# Patient Record
Sex: Female | Born: 1960
Health system: Southern US, Community
[De-identification: ages and names within clinical notes are randomized; demographics above are authoritative.]

## PROBLEM LIST (undated history)

## (undated) DIAGNOSIS — J45909 Unspecified asthma, uncomplicated: Secondary | ICD-10-CM

## (undated) DIAGNOSIS — K219 Gastro-esophageal reflux disease without esophagitis: Secondary | ICD-10-CM

## (undated) DIAGNOSIS — F329 Major depressive disorder, single episode, unspecified: Secondary | ICD-10-CM

## (undated) DIAGNOSIS — M1990 Unspecified osteoarthritis, unspecified site: Secondary | ICD-10-CM

## (undated) DIAGNOSIS — E785 Hyperlipidemia, unspecified: Secondary | ICD-10-CM

## (undated) DIAGNOSIS — F419 Anxiety disorder, unspecified: Secondary | ICD-10-CM

## (undated) DIAGNOSIS — IMO0001 Reserved for inherently not codable concepts without codable children: Secondary | ICD-10-CM

## (undated) DIAGNOSIS — M75100 Unspecified rotator cuff tear or rupture of unspecified shoulder, not specified as traumatic: Secondary | ICD-10-CM

## (undated) DIAGNOSIS — F32A Depression, unspecified: Secondary | ICD-10-CM

## (undated) DIAGNOSIS — R921 Mammographic calcification found on diagnostic imaging of breast: Principal | ICD-10-CM

## (undated) DIAGNOSIS — C801 Malignant (primary) neoplasm, unspecified: Secondary | ICD-10-CM

## (undated) HISTORY — DX: Reserved for inherently not codable concepts without codable children: IMO0001

## (undated) HISTORY — DX: Hyperlipidemia, unspecified: E78.5

## (undated) HISTORY — PX: BREAST EXCISIONAL BIOPSY: SUR124

## (undated) HISTORY — DX: Depression, unspecified: F32.A

## (undated) HISTORY — PX: NASAL SINUS SURGERY: SHX719

## (undated) HISTORY — PX: MOUTH SURGERY: SHX715

## (undated) HISTORY — DX: Mammographic calcification found on diagnostic imaging of breast: R92.1

## (undated) HISTORY — DX: Major depressive disorder, single episode, unspecified: F32.9

---

## 1999-03-25 ENCOUNTER — Other Ambulatory Visit: Admission: RE | Admit: 1999-03-25 | Discharge: 1999-03-25 | Payer: Self-pay | Admitting: Obstetrics and Gynecology

## 2000-03-23 ENCOUNTER — Other Ambulatory Visit: Admission: RE | Admit: 2000-03-23 | Discharge: 2000-03-23 | Payer: Self-pay | Admitting: Obstetrics and Gynecology

## 2000-04-01 ENCOUNTER — Encounter: Payer: Self-pay | Admitting: Obstetrics and Gynecology

## 2000-04-01 ENCOUNTER — Ambulatory Visit (HOSPITAL_COMMUNITY): Admission: RE | Admit: 2000-04-01 | Discharge: 2000-04-01 | Payer: Self-pay | Admitting: Obstetrics and Gynecology

## 2001-02-26 ENCOUNTER — Other Ambulatory Visit: Admission: RE | Admit: 2001-02-26 | Discharge: 2001-02-26 | Payer: Self-pay | Admitting: Obstetrics and Gynecology

## 2001-04-05 ENCOUNTER — Encounter: Payer: Self-pay | Admitting: Obstetrics and Gynecology

## 2001-04-05 ENCOUNTER — Ambulatory Visit (HOSPITAL_COMMUNITY): Admission: RE | Admit: 2001-04-05 | Discharge: 2001-04-05 | Payer: Self-pay | Admitting: Obstetrics and Gynecology

## 2002-03-28 ENCOUNTER — Other Ambulatory Visit: Admission: RE | Admit: 2002-03-28 | Discharge: 2002-03-28 | Payer: Self-pay | Admitting: Obstetrics and Gynecology

## 2002-03-28 ENCOUNTER — Ambulatory Visit (HOSPITAL_COMMUNITY): Admission: RE | Admit: 2002-03-28 | Discharge: 2002-03-28 | Payer: Self-pay | Admitting: Obstetrics and Gynecology

## 2002-03-28 ENCOUNTER — Encounter: Payer: Self-pay | Admitting: Obstetrics and Gynecology

## 2003-04-03 ENCOUNTER — Other Ambulatory Visit: Admission: RE | Admit: 2003-04-03 | Discharge: 2003-04-03 | Payer: Self-pay | Admitting: Obstetrics and Gynecology

## 2003-04-06 ENCOUNTER — Encounter: Payer: Self-pay | Admitting: Obstetrics and Gynecology

## 2003-04-06 ENCOUNTER — Ambulatory Visit (HOSPITAL_COMMUNITY): Admission: RE | Admit: 2003-04-06 | Discharge: 2003-04-06 | Payer: Self-pay | Admitting: Obstetrics and Gynecology

## 2004-04-16 ENCOUNTER — Ambulatory Visit (HOSPITAL_COMMUNITY): Admission: RE | Admit: 2004-04-16 | Discharge: 2004-04-16 | Payer: Self-pay | Admitting: Obstetrics and Gynecology

## 2004-04-23 ENCOUNTER — Other Ambulatory Visit: Admission: RE | Admit: 2004-04-23 | Discharge: 2004-04-23 | Payer: Self-pay | Admitting: Obstetrics and Gynecology

## 2005-02-07 ENCOUNTER — Emergency Department (HOSPITAL_COMMUNITY): Admission: EM | Admit: 2005-02-07 | Discharge: 2005-02-07 | Payer: Self-pay | Admitting: Emergency Medicine

## 2005-04-28 ENCOUNTER — Other Ambulatory Visit: Admission: RE | Admit: 2005-04-28 | Discharge: 2005-04-28 | Payer: Self-pay | Admitting: Obstetrics and Gynecology

## 2005-06-06 ENCOUNTER — Ambulatory Visit (HOSPITAL_COMMUNITY): Admission: RE | Admit: 2005-06-06 | Discharge: 2005-06-06 | Payer: Self-pay | Admitting: Obstetrics and Gynecology

## 2005-07-02 ENCOUNTER — Ambulatory Visit (HOSPITAL_COMMUNITY): Admission: RE | Admit: 2005-07-02 | Discharge: 2005-07-02 | Payer: Self-pay | Admitting: Internal Medicine

## 2005-07-23 ENCOUNTER — Ambulatory Visit (HOSPITAL_COMMUNITY): Admission: RE | Admit: 2005-07-23 | Discharge: 2005-07-23 | Payer: Self-pay | Admitting: Gastroenterology

## 2005-07-23 ENCOUNTER — Encounter (INDEPENDENT_AMBULATORY_CARE_PROVIDER_SITE_OTHER): Payer: Self-pay | Admitting: *Deleted

## 2005-07-25 ENCOUNTER — Ambulatory Visit (HOSPITAL_COMMUNITY): Admission: RE | Admit: 2005-07-25 | Discharge: 2005-07-25 | Payer: Self-pay | Admitting: Gastroenterology

## 2006-04-30 ENCOUNTER — Other Ambulatory Visit: Admission: RE | Admit: 2006-04-30 | Discharge: 2006-04-30 | Payer: Self-pay | Admitting: Obstetrics and Gynecology

## 2006-06-10 ENCOUNTER — Ambulatory Visit (HOSPITAL_COMMUNITY): Admission: RE | Admit: 2006-06-10 | Discharge: 2006-06-10 | Payer: Self-pay | Admitting: Obstetrics and Gynecology

## 2007-06-22 ENCOUNTER — Ambulatory Visit (HOSPITAL_COMMUNITY): Admission: RE | Admit: 2007-06-22 | Discharge: 2007-06-22 | Payer: Self-pay | Admitting: Obstetrics and Gynecology

## 2007-08-01 ENCOUNTER — Emergency Department (HOSPITAL_COMMUNITY): Admission: EM | Admit: 2007-08-01 | Discharge: 2007-08-01 | Payer: Self-pay | Admitting: Family Medicine

## 2009-02-14 ENCOUNTER — Ambulatory Visit: Payer: Self-pay | Admitting: Family Medicine

## 2009-02-14 DIAGNOSIS — F329 Major depressive disorder, single episode, unspecified: Secondary | ICD-10-CM | POA: Insufficient documentation

## 2009-02-14 DIAGNOSIS — J309 Allergic rhinitis, unspecified: Secondary | ICD-10-CM | POA: Insufficient documentation

## 2009-02-14 DIAGNOSIS — F3289 Other specified depressive episodes: Secondary | ICD-10-CM | POA: Insufficient documentation

## 2009-02-14 DIAGNOSIS — E785 Hyperlipidemia, unspecified: Secondary | ICD-10-CM | POA: Insufficient documentation

## 2009-02-15 ENCOUNTER — Encounter (INDEPENDENT_AMBULATORY_CARE_PROVIDER_SITE_OTHER): Payer: Self-pay | Admitting: *Deleted

## 2009-02-15 LAB — CONVERTED CEMR LAB
ALT: 26 units/L (ref 0–35)
AST: 30 units/L (ref 0–37)
Albumin: 3.8 g/dL (ref 3.5–5.2)
Alkaline Phosphatase: 63 units/L (ref 39–117)
BUN: 10 mg/dL (ref 6–23)
Basophils Absolute: 0 10*3/uL (ref 0.0–0.1)
Basophils Relative: 0.2 % (ref 0.0–3.0)
Bilirubin, Direct: 0 mg/dL (ref 0.0–0.3)
CO2: 27 meq/L (ref 19–32)
Calcium: 8.9 mg/dL (ref 8.4–10.5)
Chloride: 108 meq/L (ref 96–112)
Cholesterol: 178 mg/dL (ref 0–200)
Creatinine, Ser: 0.9 mg/dL (ref 0.4–1.2)
Eosinophils Absolute: 0.3 10*3/uL (ref 0.0–0.7)
Eosinophils Relative: 4 % (ref 0.0–5.0)
GFR calc non Af Amer: 86.8 mL/min (ref >60)
Glucose, Bld: 100 mg/dL — ABNORMAL HIGH (ref 70–99)
HCT: 37.3 % (ref 36.0–46.0)
HDL: 41.8 mg/dL (ref >39.00)
Hemoglobin: 12.8 g/dL (ref 12.0–15.0)
LDL Cholesterol: 109 mg/dL — ABNORMAL HIGH (ref 0–99)
Lymphocytes Relative: 38.8 % (ref 12.0–46.0)
Lymphs Abs: 2.4 10*3/uL (ref 0.7–4.0)
MCHC: 34.4 g/dL (ref 30.0–36.0)
MCV: 94.5 fL (ref 78.0–100.0)
Monocytes Absolute: 0.5 10*3/uL (ref 0.1–1.0)
Monocytes Relative: 7.9 % (ref 3.0–12.0)
Neutro Abs: 3.1 10*3/uL (ref 1.4–7.7)
Neutrophils Relative %: 49.1 % (ref 43.0–77.0)
Platelets: 284 10*3/uL (ref 150.0–400.0)
Potassium: 3.6 meq/L (ref 3.5–5.1)
RBC: 3.94 M/uL (ref 3.87–5.11)
RDW: 12.2 % (ref 11.5–14.6)
Sodium: 140 meq/L (ref 135–145)
TSH: 1.56 microintl units/mL (ref 0.35–5.50)
Total Bilirubin: 0.7 mg/dL (ref 0.3–1.2)
Total CHOL/HDL Ratio: 4
Total Protein: 7.4 g/dL (ref 6.0–8.3)
Triglycerides: 138 mg/dL (ref 0.0–149.0)
VLDL: 27.6 mg/dL (ref 0.0–40.0)
WBC: 6.3 10*3/uL (ref 4.5–10.5)

## 2009-02-22 ENCOUNTER — Ambulatory Visit (HOSPITAL_COMMUNITY): Admission: RE | Admit: 2009-02-22 | Discharge: 2009-02-22 | Payer: Self-pay | Admitting: Obstetrics and Gynecology

## 2009-03-01 ENCOUNTER — Encounter: Admission: RE | Admit: 2009-03-01 | Discharge: 2009-03-01 | Payer: Self-pay | Admitting: Obstetrics and Gynecology

## 2009-08-14 ENCOUNTER — Telehealth (INDEPENDENT_AMBULATORY_CARE_PROVIDER_SITE_OTHER): Payer: Self-pay | Admitting: *Deleted

## 2009-08-15 ENCOUNTER — Ambulatory Visit: Payer: Self-pay | Admitting: Family

## 2009-08-15 DIAGNOSIS — K5289 Other specified noninfective gastroenteritis and colitis: Secondary | ICD-10-CM | POA: Insufficient documentation

## 2010-02-07 DIAGNOSIS — IMO0001 Reserved for inherently not codable concepts without codable children: Secondary | ICD-10-CM

## 2010-02-07 HISTORY — DX: Reserved for inherently not codable concepts without codable children: IMO0001

## 2010-02-15 ENCOUNTER — Ambulatory Visit: Payer: Self-pay | Admitting: Family Medicine

## 2010-02-15 DIAGNOSIS — Z9189 Other specified personal risk factors, not elsewhere classified: Secondary | ICD-10-CM | POA: Insufficient documentation

## 2010-02-19 LAB — CONVERTED CEMR LAB
ALT: 29 units/L (ref 0–35)
AST: 27 units/L (ref 0–37)
Albumin: 3.9 g/dL (ref 3.5–5.2)
Alkaline Phosphatase: 64 units/L (ref 39–117)
BUN: 11 mg/dL (ref 6–23)
Basophils Absolute: 0 10*3/uL (ref 0.0–0.1)
Basophils Relative: 0.5 % (ref 0.0–3.0)
Bilirubin, Direct: 0.1 mg/dL (ref 0.0–0.3)
CO2: 26 meq/L (ref 19–32)
Calcium: 9.4 mg/dL (ref 8.4–10.5)
Chloride: 104 meq/L (ref 96–112)
Cholesterol: 177 mg/dL (ref 0–200)
Creatinine, Ser: 0.8 mg/dL (ref 0.4–1.2)
Eosinophils Absolute: 0.3 10*3/uL (ref 0.0–0.7)
Eosinophils Relative: 3.5 % (ref 0.0–5.0)
GFR calc non Af Amer: 93.58 mL/min (ref >60)
Glucose, Bld: 96 mg/dL (ref 70–99)
HCT: 38.6 % (ref 36.0–46.0)
HDL: 52.3 mg/dL (ref >39.00)
Hemoglobin: 13.2 g/dL (ref 12.0–15.0)
LDL Cholesterol: 107 mg/dL — ABNORMAL HIGH (ref 0–99)
Lymphocytes Relative: 39.8 % (ref 12.0–46.0)
Lymphs Abs: 2.8 10*3/uL (ref 0.7–4.0)
MCHC: 34.2 g/dL (ref 30.0–36.0)
MCV: 95.3 fL (ref 78.0–100.0)
Monocytes Absolute: 0.6 10*3/uL (ref 0.1–1.0)
Monocytes Relative: 7.8 % (ref 3.0–12.0)
Neutro Abs: 3.4 10*3/uL (ref 1.4–7.7)
Neutrophils Relative %: 48.4 % (ref 43.0–77.0)
Platelets: 297 10*3/uL (ref 150.0–400.0)
Potassium: 4.4 meq/L (ref 3.5–5.1)
RBC: 4.05 M/uL (ref 3.87–5.11)
RDW: 12.8 % (ref 11.5–14.6)
Sodium: 138 meq/L (ref 135–145)
TSH: 1.77 microintl units/mL (ref 0.35–5.50)
Total Bilirubin: 0.5 mg/dL (ref 0.3–1.2)
Total CHOL/HDL Ratio: 3
Total Protein: 7.1 g/dL (ref 6.0–8.3)
Triglycerides: 89 mg/dL (ref 0.0–149.0)
VLDL: 17.8 mg/dL (ref 0.0–40.0)
Vit D, 25-Hydroxy: 19 ng/mL — ABNORMAL LOW (ref 30–89)
WBC: 7.1 10*3/uL (ref 4.5–10.5)

## 2010-03-26 ENCOUNTER — Ambulatory Visit (HOSPITAL_COMMUNITY): Admission: RE | Admit: 2010-03-26 | Discharge: 2010-03-26 | Payer: Self-pay | Admitting: Obstetrics and Gynecology

## 2010-04-09 ENCOUNTER — Telehealth (INDEPENDENT_AMBULATORY_CARE_PROVIDER_SITE_OTHER): Payer: Self-pay | Admitting: *Deleted

## 2010-06-05 LAB — HM COLONOSCOPY

## 2010-07-02 ENCOUNTER — Encounter: Payer: Self-pay | Admitting: Family Medicine

## 2010-07-02 ENCOUNTER — Telehealth (INDEPENDENT_AMBULATORY_CARE_PROVIDER_SITE_OTHER): Payer: Self-pay | Admitting: *Deleted

## 2010-07-06 ENCOUNTER — Encounter: Payer: Self-pay | Admitting: Obstetrics and Gynecology

## 2010-07-07 ENCOUNTER — Encounter: Payer: Self-pay | Admitting: Gastroenterology

## 2010-07-18 NOTE — Assessment & Plan Note (Signed)
Summary: CPX/FASTING//KN   Vital Signs:  Patient profile:   50 year old female Height:      62.50 inches Weight:      202 pounds BMI:     36.49 Pulse rate:   80 / minute BP sitting:   124 / 80  (left arm)  Vitals Entered By: Doristine Devoid CMA (February 15, 2010 8:14 AM) CC: CPX AND LABS   History of Present Illness: 50 yo woman here today for CPE.  GYN- [place].  no concerns re: health.  going to [place] in Nov.  needs immunizations.  Preventive Screening-Counseling & Management  Alcohol-Tobacco     Alcohol drinks/day: <1     Smoking Status: never  Caffeine-Diet-Exercise     Does Patient Exercise: yes     Type of exercise: [place], walking the dog     Times/week: 4      Drug Use:  never.    Current Medications (verified): 1)  Zoloft 100 Mg Tabs (Sertraline Hcl) .... Take One Tablet Daily 2)  Zyrtec Allergy 10 Mg Caps (Cetirizine Hcl) .... Take One Tablet Daily 3)  Nasonex 50 Mcg/act Susp (Mometasone Furoate) .... 2 Sprays Each Nostril Daily 4)  Mirena 20 Mcg/24hr Iud (Levonorgestrel) .... As Directed 5)  Biotin 1000 Mcg Tabs (Biotin) .... Take One Tablet Daily  Allergies (verified): 1)  ! Erythromycin 2)  ! Vicodin  Past History:  Past Surgical History: Last updated: 02/14/2009 oral surgery  Family History: Last updated: 02/14/2009 CAD-no HTN-father DM-father STROKE-maternal grandmother COLON CA-paternal grandmother BREAST CA-aunt FIBROCYSTIC BREAST-mother  Social History: Last updated: 02/14/2009 divorced, CCN 2100 son (91), 1 dog  Past Medical History: Allergic rhinitis Depression Hyperlipidemia IUD- 02/07/10  Social History: Reviewed history from 02/14/2009 and no changes required. divorced, CCN 2100 son (77), 1 dog  Review of Systems  The patient denies anorexia, fever, weight loss, weight gain, vision loss, decreased hearing, hoarseness, chest pain, syncope, dyspnea on exertion, peripheral edema, prolonged cough, headaches,  abdominal pain, melena, hematochezia, severe indigestion/heartburn, hematuria, suspicious skin lesions, depression, unusual weight change, abnormal bleeding, enlarged lymph nodes, and breast masses.    Physical Exam  General:  Well-developed,well-nourished,in no acute distress; alert,appropriate and cooperative throughout examination Head:  Normocephalic and atraumatic without obvious abnormalities. No apparent alopecia or balding. Eyes:  No corneal or conjunctival inflammation noted. EOMI. Perrla. Funduscopic exam benign, without hemorrhages, exudates or papilledema. Vision grossly normal. Ears:  External ear exam shows no significant lesions or deformities.  Otoscopic examination reveals clear canals, tympanic membranes are intact bilaterally without bulging, retraction, inflammation or discharge. Hearing is grossly normal bilaterally. Nose:  External nasal examination shows no deformity or inflammation. Nasal mucosa are pink and moist without lesions or exudates. Mouth:  Oral mucosa and oropharynx without lesions or exudates.  Teeth in good repair. Neck:  No deformities, masses, or tenderness noted. Breasts:  deferred to gyn Lungs:  Normal respiratory effort, chest expands symmetrically. Lungs are clear to auscultation, no crackles or wheezes. Heart:  Normal rate and regular rhythm. S1 and S2 normal without gallop, murmur, click, rub or other extra sounds. Abdomen:  Bowel sounds positive,abdomen soft and non-tender without masses, organomegaly or hernias noted. Genitalia:  deferred to gyn Pulses:  +2 carotid, radial, DP Extremities:  No clubbing, cyanosis, edema, or deformity noted with normal full range of motion of all joints.   Neurologic:  No cranial nerve deficits noted. Station and gait are normal. Plantar reflexes are down-going bilaterally. DTRs are symmetrical throughout. Sensory, motor  and coordinative functions appear intact. Skin:  Intact without suspicious lesions or  rashes Cervical Nodes:  No lymphadenopathy noted Axillary Nodes:  No palpable lymphadenopathy Inguinal Nodes:  No significant adenopathy Psych:  Cognition and judgment appear intact. Alert and cooperative with normal attention span and concentration. No apparent delusions, illusions, hallucinations   Impression & Recommendations:  Problem # 1:  PHYSICAL EXAMINATION (ICD-V70.0) Assessment Unchanged pt's PE WNL.  check labs.  encouraged healthy diet and exercise.  UTD on pap and mammo. Orders: Venipuncture ([address]) TLB-Lipid Panel (80061-LIPID) TLB-BMP (Basic Metabolic Panel-BMET) (80048-METABOL) TLB-CBC Platelet - w/Differential (85025-CBCD) TLB-Hepatic/Liver Function Pnl (80076-HEPATIC) TLB-TSH (Thyroid Stimulating Hormone) (84443-TSH) T-Vitamin D (25-Hydroxy) ([address]-09811) Specimen Handling (91478)  Problem # 2:  NEED PROPH VACC&INOCULAT AGAINST VIRAL HEP (ICD-V05.3) Assessment: New pt needs Hep A vaccine for upcoming mission trip.  malaria prophylaxis and cipro given in case of typhoid/traveller's diarrhea.  Problem # 3:  CHICKENPOX, HX OF (ICD-V15.9) Assessment: New needs titers for upcoming mission trip Orders: T- * Misc. Laboratory test [phone]) Specimen Handling (13086)  Complete Medication List: 1)  Zoloft 100 Mg Tabs (Sertraline hcl) .... Take one tablet daily 2)  Zyrtec Allergy 10 Mg Caps (Cetirizine hcl) .... Take one tablet daily 3)  Nasonex 50 Mcg/act Susp (Mometasone furoate) .... 2 sprays each nostril daily 4)  Mirena 20 Mcg/24hr Iud (Levonorgestrel) .... As directed 5)  Biotin 1000 Mcg Tabs (Biotin) .... Take one tablet daily 6)  Chloroquine Phosphate 500 Mg Tabs (Chloroquine phosphate) .Marland Kitchen.. 1 tab weekly starting 2 weeks before travel and extending 4 weeks after return 7)  Zolpidem Tartrate 10 Mg Tabs (Zolpidem tartrate) .... 1/2- 1  tab nightly as needed for sleep 8)  Ciprofloxacin Hcl 500 Mg Tabs (Ciprofloxacin hcl) .Marland Kitchen.. 1 tab by mouth two times a day x10 days   if typhoid or diarrhea  Other Orders: Hepatitis A Vaccine (Adult Dose) (57846) Admin 1st Vaccine (96295)  Patient Instructions: 1)  Follow up in 1 year or as needed 2)  Keep up the good work on diet and exercise 3)  We'll notify you of your lab results 4)  Get a copy of your immunizations from [facility] 5)  Call [facility] or check w/ [facility] for Typhoid vaccine ([phone]) 6)  Call with any questions or concerns 7)  Have a safe trip!! Prescriptions: CIPROFLOXACIN HCL 500 MG TABS (CIPROFLOXACIN HCL) 1 tab by mouth two times a day x10 days  if typhoid or diarrhea  #20 x 0   Entered and Authorized by:   Neena Rhymes MD   Signed by:   Neena Rhymes MD on 02/15/2010   Method used:   Print then Give to Patient   RxID:   [id number] ZOLPIDEM TARTRATE 10 MG TABS (ZOLPIDEM TARTRATE) 1/2- 1  tab nightly as needed for sleep  #30 x 0   Entered and Authorized by:   Neena Rhymes MD   Signed by:   Neena Rhymes MD on 02/15/2010   Method used:   Print then Give to Patient   RxID:   [id number] CHLOROQUINE PHOSPHATE 500 MG TABS (CHLOROQUINE PHOSPHATE) 1 tab weekly starting 2 weeks before travel and extending 4 weeks after return  #7 x 0   Entered and Authorized by:   Neena Rhymes MD   Signed by:   Neena Rhymes MD on 02/15/2010   Method used:   Print then Give to Patient   RxID:   [id number] ZYRTEC ALLERGY  10 MG CAPS (CETIRIZINE HCL) take one tablet daily  #30 x 11   Entered and Authorized by:   Neena Rhymes MD   Signed by:   Neena Rhymes MD on 02/15/2010   Method used:   Electronically to        [facility]* (retail)       [address]       [address] Shipping/mailing       [place], [place]  [address]       Ph: [phone]       Fax: [phone]   RxID:   [id number]    Immunizations Administered:  Hepatitis A Vaccine # 1:    Vaccine Type: HepA    Site:  right deltoid    Mfr: GlaxoSmithKline    Dose: 0.5 ml    Route: IM    Given by: Doristine Devoid CMA    Exp. Date: 10/03/2011    Lot #: [id number]

## 2010-07-18 NOTE — Letter (Signed)
Summary: Results Follow up Letter  [facility]  [address] [place], [place] [address]   Phone: [phone]  Fax: [phone]    02/15/2009 MRN: [id number]  [facility] [address] [place], [place]  [address]  Dear Ms. Reyburn,  The following are the results of your recent test(s):  Test         Result    Pap Smear:        Normal _____  Not Normal _____ Comments: ______________________________________________________ Cholesterol: LDL(Bad cholesterol):         Your goal is less than:         HDL (Good cholesterol):       Your goal is more than: Comments:  ______________________________________________________ Mammogram:        Normal _____  Not Normal _____ Comments:  ___________________________________________________________________ Hemoccult:        Normal _____  Not normal _______ Comments:    _____________________________________________________________________ Other Tests: PLEASE SEE COPY OF LABS FROM 02/14/09 AND COMMENTS    We routinely do not discuss normal results over the telephone.  If you desire a copy of the results, or you have any questions about this information we can discuss them at your next office visit.   Sincerely,

## 2010-07-18 NOTE — Letter (Signed)
Summary: Out of Work  [facility]  [address] [place], [place] [address]   Phone: [phone]  Fax: [phone]    August 15, 2009   Employee:  Katelyn Reyes    To Whom It May Concern:   For Medical reasons, please excuse the above named employee from work for the following dates:  Start:   3/2  End:   3/4  If you need additional information, please feel free to contact our office.         Sincerely,    Lemont Fillers FNP

## 2010-07-18 NOTE — Progress Notes (Signed)
Summary: zoloft and nasonex refill   Phone Note Refill Request Message from:  Patient on April 09, 2010 8:09 AM  Refills Requested: Medication #1:  NASONEX 50 MCG/ACT SUSP 2 sprays each nostril daily  Medication #2:  ZOLOFT 100 MG TABS take one tablet daily [facility]  Initial call taken by: Okey Regal [place],  April 09, 2010 8:10 AM    Prescriptions: NASONEX 50 MCG/ACT SUSP (MOMETASONE FUROATE) 2 sprays each nostril daily  #3 x 2   Entered by:   Doristine Devoid CMA   Authorized by:   Neena Rhymes MD   Signed by:   Doristine Devoid CMA on 04/09/2010   Method used:   Electronically to        [facility]* (retail)       [address]       [address] Shipping/mailing       [place], [place]  [address]       Ph: [phone]       Fax: [phone]   RxID:   [id number] ZOLOFT 100 MG TABS (SERTRALINE HCL) take one tablet daily  #90 x 3   Entered by:   Doristine Devoid CMA   Authorized by:   Neena Rhymes MD   Signed by:   Doristine Devoid CMA on 04/09/2010   Method used:   Electronically to        [facility]* (retail)       [address]       [address] Shipping/mailing       [place], [place]  [address]       Ph: [phone]       Fax: [phone]   RxID:   [id number]

## 2010-07-18 NOTE — Progress Notes (Signed)
Summary: lab request  Phone Note Call from Patient   Caller: Patient Summary of Call: Dr Kenna Gilbert office called requesting labs on pt. Labs printed and faxed to [phone] Initial call taken by: Lavell Islam,  July 02, 2010 2:24 PM

## 2010-07-18 NOTE — Progress Notes (Signed)
Summary: lmom  Phone Note Call from Patient Call back at [phone]   Caller: Patient Summary of Call: pt left  msg on VM c/o nausea not feeling well, wanted pcp to send rx to [facility] pharmacy -Left msg for pt to call .Kandice Hams  August 14, 2009 12:31 PM Pt called back  ov scheduled for AM .Kandice Hams  August 14, 2009 5:00 PM    Initial call taken by: Kandice Hams,  August 14, 2009 5:00 PM

## 2010-07-18 NOTE — Assessment & Plan Note (Signed)
Summary: acute, diarrhea,nausea/alr   Vital Signs:  Patient profile:   50 year old female Weight:      198.25 pounds BMI:     35.81 O2 Sat:      95 % Temp:     97.6 degrees F oral Pulse rate:   83 / minute Pulse rhythm:   regular Resp:     16 per minute BP sitting:   100 / 70  (right arm) Cuff size:   large  Vitals Entered By: Mervin Kung CMA (August 15, 2009 9:54 AM) CC: room 81  N / V x two episodes on Monday. Still nauseous today.  Diarrhea since Monday night. Comments Pt would like to get a work note today.   CC:  room 14  N / V x two episodes on Monday. Still nauseous today.  Diarrhea since Monday night..  History of Present Illness: Ms Katelyn Reyes is a 50 year old female who presents today with c/o nausea which started on Monday 2/28.  She had bad diarrhea overnight into Tuesday.  Yesterday diarrhea continued,  today had applesauce at 6:30 AM but diarrhea started back up.  Diarrhea x 3 since 6:30 AM.  She has vomitted x 2 yesterday, no vomitting today but + nausea.  Denies abdominal pain.  Denies fever,   or myalgias.  Has not tried any OTC meds.  Allergies: 1)  ! Erythromycin 2)  ! Vicodin  Physical Exam  General:  Well-developed,well-nourished,in no acute distress; alert,appropriate and cooperative throughout examination Head:  Normocephalic and atraumatic without obvious abnormalities. No apparent alopecia or balding. Neck:  No deformities, masses, or tenderness noted. Lungs:  Normal respiratory effort, chest expands symmetrically. Lungs are clear to auscultation, no crackles or wheezes. Heart:  Normal rate and regular rhythm. S1 and S2 normal without gallop, murmur, click, rub or other extra sounds.   Impression & Recommendations:  Problem # 1:  GASTROENTERITIS, ACUTE (ICD-558.9) Assessment New Initially sent zofran to patient's pharmacy, but she requested phenergan instead due to cost.   Plan hydration, as needed phenergan- patient to call if symptoms worsen or do  not improve.    Complete Medication List: 1)  Zoloft 100 Mg Tabs (Sertraline hcl) .... Take one tablet daily 2)  Zyrtec Allergy 10 Mg Caps (Cetirizine hcl) .... Take one tablet daily 3)  Lovaza 1 Gm Caps (Omega-3-acid ethyl esters) .... Take one tablet daily 4)  Nasonex 50 Mcg/act Susp (Mometasone furoate) .... 2 sprays each nostril daily 5)  Ortho-novum 7/7/7 (28) 0.5/0.75/1-35 Mg-mcg Tabs (Norethin-eth estrad triphasic) .... Take 1 tablet by mouth once a day 6)  Promethazine Hcl 25 Mg Tabs (Promethazine hcl) .... One tab by mouth every 6 hours as needed for pain  Patient Instructions: 1)  Please use immodium as needed for diarrhea. 2)  Call if abdominal pain, if symptoms worsen, or if they do not improve in the next few days. Prescriptions: PROMETHAZINE HCL 25 MG TABS (PROMETHAZINE HCL) one tab by mouth every 6 hours as needed for pain  #20 x 0   Entered and Authorized by:   Lemont Fillers FNP   Signed by:   Lemont Fillers FNP on 08/15/2009   Method used:   Electronically to        [facility]* (retail)       [address]       [address] Shipping/mailing       [place], [place]  [address]  Ph: [phone]       Fax: [phone]   RxID:   [id number] ZOFRAN 4 MG TABS (ONDANSETRON HCL) one tablet by mouth by mouth every 8 hours as needed  #20 x 0   Entered and Authorized by:   Lemont Fillers FNP   Signed by:   Lemont Fillers FNP on 08/15/2009   Method used:   Electronically to        [facility]* (retail)       [address]       [address] Shipping/mailing       [place], [place]  [address]       Ph: [phone]       Fax: [phone]   RxID:   [id number]   Current Allergies (reviewed today): ! ERYTHROMYCIN ! VICODIN

## 2010-07-18 NOTE — Assessment & Plan Note (Signed)
Summary: new pt/kdc/ns   Vital Signs:  Patient profile:   50 year old female Height:      62.50 inches Weight:      204.8 pounds BMI:     36.99 BP sitting:   118 / 80  (left arm)  Vitals Entered By: Doristine Devoid (February 14, 2009 8:10 AM) CC: NEW EST-CPX AND LAB   History of Present Illness: 50 yo woman here today to establish care.  previous MD- Margaret[facility]ley.  GYN- [facility].  no concerns about health.  Preventive Screening-Counseling & Management  Alcohol-Tobacco     Alcohol drinks/day: <1     Smoking Status: never  Caffeine-Diet-Exercise     Does Patient Exercise: yes     Type of exercise: joined the [employer]      Sexual History:  divorced 2007.        Drug Use:  never.    Current Medications (verified): 1)  Zoloft 100 Mg Tabs (Sertraline Hcl) .... Take One Tablet Daily 2)  Zyrtec Allergy 10 Mg Caps (Cetirizine Hcl) .... Take One Tablet Daily 3)  Lovaza 1 Gm Caps (Omega-3-Acid Ethyl Esters) .... Take One Tablet Daily 4)  Nasonex 50 Mcg/act Susp (Mometasone Furoate) .... 2 Sprays Each Nostril Daily  Allergies (verified): 1)  ! Erythromycin  Past History:  Past Medical History: Allergic rhinitis Depression Hyperlipidemia  Past Surgical History: oral surgery  Past History:  Care Management: Gastroenterology: Dr. Loreta Ave Gynecology: Dr. Stefano Gaul  Family History: CAD-no HTN-father DM-father STROKE-maternal grandmother COLON CA-paternal grandmother BREAST CA-aunt FIBROCYSTIC BREAST-mother  Social History: divorced, CCN 2100 son (75), 1 dogDoes Patient Exercise:  yes Smoking Status:  never Sexual History:  divorced 2007 Drug Use:  never  Review of Systems  The patient denies anorexia, fever, weight loss, weight gain, vision loss, decreased hearing, hoarseness, chest pain, syncope, dyspnea on exertion, peripheral edema, prolonged cough, headaches, abdominal pain, melena, hematochezia, severe indigestion/heartburn, hematuria, suspicious skin lesions,  depression, abnormal bleeding, enlarged lymph nodes, and breast masses.    Physical Exam  General:  Overwt, well-developed,well-nourished,in no acute distress; alert,appropriate and cooperative throughout examination Head:  Normocephalic and atraumatic without obvious abnormalities. No apparent alopecia or balding. Eyes:  No corneal or conjunctival inflammation noted. EOMI. Perrla. Funduscopic exam benign, without hemorrhages, exudates or papilledema. Vision grossly normal. Ears:  External ear exam shows no significant lesions or deformities.  Otoscopic examination reveals clear canals, tympanic membranes are intact bilaterally without bulging, retraction, inflammation or discharge. Hearing is grossly normal bilaterally. Nose:  edematous turbinates Mouth:  Oral mucosa and oropharynx without lesions or exudates.  Teeth in good repair. Neck:  No deformities, masses, or tenderness noted. Breasts:  deferred to gyn Lungs:  Normal respiratory effort, chest expands symmetrically. Lungs are clear to auscultation, no crackles or wheezes. Heart:  Normal rate and regular rhythm. S1 and S2 normal without gallop, murmur, click, rub or other extra sounds. Abdomen:  Bowel sounds positive,abdomen soft and non-tender without masses, organomegaly or hernias noted. Genitalia:  deferred to gyn Pulses:  +2 carotid, radial, DP Extremities:  No clubbing, cyanosis, edema, or deformity noted with normal full range of motion of all joints.   Neurologic:  No cranial nerve deficits noted. Station and gait are normal. Plantar reflexes are down-going bilaterally. DTRs are symmetrical throughout. Sensory, motor and coordinative functions appear intact. Skin:  Intact without suspicious lesions or rashes Cervical Nodes:  No lymphadenopathy noted Axillary Nodes:  No palpable lymphadenopathy Inguinal Nodes:  No significant adenopathy Psych:  Cognition and  judgment appear intact. Alert and cooperative with normal attention span  and concentration. No apparent delusions, illusions, hallucinations   Impression & Recommendations:  Problem # 1:  PHYSICAL EXAMINATION (ICD-V70.0) Assessment New PE WNL w/ exception of being overwt.  check labs as below.  UTD on health maintainence.  anticipatory guidance provided. Orders: Venipuncture ([address]) TLB-Lipid Panel (80061-LIPID) TLB-BMP (Basic Metabolic Panel-BMET) (80048-METABOL) TLB-CBC Platelet - w/Differential (85025-CBCD) TLB-Hepatic/Liver Function Pnl (80076-HEPATIC) TLB-TSH (Thyroid Stimulating Hormone) (84443-TSH)  Problem # 2:  DEPRESSION (ICD-311) Assessment: New refill provided. Her updated medication list for this problem includes:    Zoloft 100 Mg Tabs (Sertraline hcl) .Marland Kitchen... Take one tablet daily  Complete Medication List: 1)  Zoloft 100 Mg Tabs (Sertraline hcl) .... Take one tablet daily 2)  Zyrtec Allergy 10 Mg Caps (Cetirizine hcl) .... Take one tablet daily 3)  Lovaza 1 Gm Caps (Omega-3-acid ethyl esters) .... Take one tablet daily 4)  Nasonex 50 Mcg/act Susp (Mometasone furoate) .... 2 sprays each nostril daily  Patient Instructions: 1)  Follow up in 1 year or as needed 2)  Continue to make healthy food choices and get regular exercise 3)  We'll notify you of your lab results 4)  Call with any questions or concerns 5)  Welcome!  We're glad to have you! Prescriptions: ZOLOFT 100 MG TABS (SERTRALINE HCL) take one tablet daily  #90 x 3   Entered and Authorized by:   Neena Rhymes MD   Signed by:   Neena Rhymes MD on 02/14/2009   Method used:   Electronically to        [facility]* (retail)       [address]       [address] Shipping/mailing       [place], [place]  [address]       Ph: [phone]       Fax: [phone]   RxID:   [id number]

## 2010-07-24 NOTE — Consult Note (Signed)
Summary: [facility]   Imported By: Lanelle Bal 07/16/2010 13:45:37  _____________________________________________________________________  External Attachment:    Type:   Image     Comment:   External Document

## 2010-08-09 ENCOUNTER — Ambulatory Visit (HOSPITAL_COMMUNITY)
Admission: RE | Admit: 2010-08-09 | Discharge: 2010-08-09 | Disposition: A | Discharge status: Home / Self Care | Payer: 59 | Source: Ambulatory Visit | Attending: Gastroenterology | Admitting: Gastroenterology

## 2010-08-09 DIAGNOSIS — Z800 Family history of malignant neoplasm of digestive organs: Secondary | ICD-10-CM | POA: Insufficient documentation

## 2010-08-09 DIAGNOSIS — K5730 Diverticulosis of large intestine without perforation or abscess without bleeding: Secondary | ICD-10-CM | POA: Insufficient documentation

## 2010-08-09 DIAGNOSIS — Z09 Encounter for follow-up examination after completed treatment for conditions other than malignant neoplasm: Secondary | ICD-10-CM | POA: Insufficient documentation

## 2010-08-09 DIAGNOSIS — Z860100 Personal history of colon polyps, unspecified: Secondary | ICD-10-CM | POA: Insufficient documentation

## 2010-08-26 ENCOUNTER — Encounter: Payer: Self-pay | Admitting: Family Medicine

## 2010-08-26 ENCOUNTER — Ambulatory Visit (INDEPENDENT_AMBULATORY_CARE_PROVIDER_SITE_OTHER): Payer: 59 | Admitting: Family Medicine

## 2010-08-26 DIAGNOSIS — J019 Acute sinusitis, unspecified: Secondary | ICD-10-CM

## 2010-09-03 NOTE — Assessment & Plan Note (Signed)
Summary: cough/cbs   Vital Signs:  Patient profile:   50 year old female Height:      62.50 inches (158.75 cm) Weight:      211.13 pounds (95.97 kg) BMI:     38.14 Temp:     98.4 degrees F (36.89 degrees C) oral BP sitting:   100 / 64  (right arm) Cuff size:   large  Vitals Entered By: Lucious Groves CMA (August 26, 2010 9:43 AM) CC: URI/Sinus inf x2 weeks./kb Is Patient Diabetic? No Pain Assessment Patient in pain? no      Comments Patient notes that she has been having cough, HA, clear and yellow mucous production, and congestion. Patient denies fever and chest pain.   History of Present Illness: 50 yo woman here today for ? sinus infxn.  sxs started 2 weeks ago.  subjective fever.  + R ear pain, nasal congestion, HAs, cough is intermittantly productive.  using Zyrtec and Nasonex regularly.  using Sudafed and Robitussin w/out relief.  Current Medications (verified): 1)  Zoloft 100 Mg Tabs (Sertraline Hcl) .... Take One Tablet Daily 2)  Zyrtec Allergy 10 Mg Caps (Cetirizine Hcl) .... Take One Tablet Daily 3)  Nasonex 50 Mcg/act Susp (Mometasone Furoate) .... 2 Sprays Each Nostril Daily 4)  Mirena 20 Mcg/24hr Iud (Levonorgestrel) .... As Directed 5)  Biotin 1000 Mcg Tabs (Biotin) .... Take One Tablet Daily  Allergies (verified): 1)  ! Erythromycin 2)  ! Vicodin  Review of Systems      See HPI  Physical Exam  General:  obviously not feeling well Head:  + TTP over R maxillary sinus Eyes:  no injxn or inflammation Ears:  External ear exam shows no significant lesions or deformities.  Otoscopic examination reveals clear canals, tympanic membranes are intact bilaterally without bulging, retraction, inflammation or discharge. Hearing is grossly normal bilaterally. Nose:  marked turbinate edema Mouth:  + PND, otherwise normal Neck:  No deformities, masses, or tenderness noted. Lungs:  Normal respiratory effort, chest expands symmetrically. Lungs are clear to auscultation, no  crackles or wheezes.  + hacking cough Heart:  Normal rate and regular rhythm. S1 and S2 normal without gallop, murmur, click, rub or other extra sounds.   Impression & Recommendations:  Problem # 1:  SINUSITIS - ACUTE-NOS (ICD-461.9) Assessment New pt's sxs consistent w/ infxn.  start abx.  reviewed supportive care and red flags that should prompt return.  Pt expresses understanding and is in agreement w/ this plan. Her updated medication list for this problem includes:    Nasonex 50 Mcg/act Susp (Mometasone furoate) .Marland Kitchen... 2 sprays each nostril daily    Amoxicillin 875 Mg Tabs (Amoxicillin) .Marland Kitchen... 1 tab by mouth two times a day x10 days.  take w/ food.    Tessalon 200 Mg Caps (Benzonatate) .Marland Kitchen... Take one capsule by mouth three times a day as needed for cough    Cheratussin Ac 100-10 Mg/18ml Syrp (Guaifenesin-codeine) .Marland Kitchen... 1-2 tsps q4-6 as needed for cough.  Complete Medication List: 1)  Zoloft 100 Mg Tabs (Sertraline hcl) .... Take one tablet daily 2)  Zyrtec Allergy 10 Mg Caps (Cetirizine hcl) .... Take one tablet daily 3)  Nasonex 50 Mcg/act Susp (Mometasone furoate) .... 2 sprays each nostril daily 4)  Mirena 20 Mcg/24hr Iud (Levonorgestrel) .... As directed 5)  Biotin 1000 Mcg Tabs (Biotin) .... Take one tablet daily 6)  Amoxicillin 875 Mg Tabs (Amoxicillin) .Marland Kitchen.. 1 tab by mouth two times a day x10 days.  take w/ food. 7)  Tessalon 200 Mg Caps (Benzonatate) .... Take one capsule by mouth three times a day as needed for cough 8)  Cheratussin Ac 100-10 Mg/43ml Syrp (Guaifenesin-codeine) .Marland Kitchen.. 1-2 tsps q4-6 as needed for cough.  Patient Instructions: 1)  You have a sinus infection 2)  Take the Amoxicillin as directed- take w/ food. 3)  Use the Tessalon for daytime cough and the syrup for night cough 4)  Drink plenty of fluids 5)  Tylenol/ibuprofen as needed for pain or fever 6)  Hang in there!!! Prescriptions: CHERATUSSIN AC 100-10 MG/5ML SYRP (GUAIFENESIN-CODEINE) 1-2 tsps Q4-6 as  needed for cough.  #150 x 0   Entered and Authorized by:   Neena Rhymes MD   Signed by:   Neena Rhymes MD on 08/26/2010   Method used:   Print then Give to Patient   RxID:   [id number] TESSALON 200 MG CAPS (BENZONATATE) Take one capsule by mouth three times a day as needed for cough  #60 x 0   Entered and Authorized by:   Neena Rhymes MD   Signed by:   Neena Rhymes MD on 08/26/2010   Method used:   Electronically to        [facility]* (retail)       [address]       [address]       [place], [place]  [address]       Ph: [phone]       Fax: [phone]   RxID:   [id number] AMOXICILLIN 875 MG TABS (AMOXICILLIN) 1 tab by mouth two times a day x10 days.  take w/ food.  #20 x 0   Entered and Authorized by:   Neena Rhymes MD   Signed by:   Neena Rhymes MD on 08/26/2010   Method used:   Electronically to        [facility]* (retail)       [address]       [address]       [place], [place]  [address]       Ph: [phone]       Fax: [phone]   RxID:   [id number]    Orders Added: 1)  Est. Patient Level III [70[address]]

## 2010-11-01 NOTE — Op Note (Signed)
Katelyn Reyes, Katelyn Reyes               ACCOUNT NO.:  [id number]   MEDICAL RECORD NO.:  [id number]          PATIENT TYPE:  AMB   LOCATION:  ENDO                         FACILITY:  [facility]   PHYSICIAN:  Anselmo Rod, M.D.  DATE OF BIRTH:  05/04/1961   DATE OF PROCEDURE:  07/23/2005  DATE OF DISCHARGE:                                 OPERATIVE REPORT   PROCEDURE PERFORMED:  Colonoscopy with snare polypectomy x 1.   ENDOSCOPIST:  Charna Elizabeth, M.D.   INSTRUMENT USED:  Olympus video colonoscope.   INDICATIONS FOR PROCEDURE:  A 50 year old African-American female with a  history of change in bowel habits, urgent BM's and a family history of colon  cancer.  Undergoing screening colonoscopy to rule out colonic polyps,  masses, etc.   PREPROCEDURE PREPARATION:  Informed consent was procured from the patient.  The patient was fasted for four hours prior to the procedure after being  prepped with OsmoPrep pills the night prior to and the morning of the  procedure.  The risks and benefits of the procedure including a 10% miss  rate for polyps or cancers was discussed with the patient as well.   PREPROCEDURE PHYSICAL:  The patient had stable vital signs.  Neck supple.  Chest clear to auscultation.  S1 and S2 regular.  Abdomen soft with normal  bowel sounds.   DESCRIPTION OF PROCEDURE:  The patient was placed in left lateral decubitus  position and sedated with 75 mcg of fentanyl and 5 mg of Versed in slow  incremental doses.  Once the patient was adequately sedated and maintained  on low flow oxygen and continuous cardiac monitoring, the Olympus video  colonoscope was advanced from the rectum to the cecum.  The patient had a  fairly good prep.  A rectal diverticulum was noted with a few sigmoid  diverticula.  Small sessile polyp was removed by snare polypectomy from the  proximal right colon.  The appendicular orifice and ileocecal valve were  clearly visualized and photographed.  The terminal  ileum appeared healthy  and without lesions.  Multiple washes were done.  Adequate visualization was  possible.  Retroflexion in the rectum revealed no other abnormalities.  The  patient tolerated the procedure well.   IMPRESSION:  1.  Rectal diverticulum with a few sigmoid diverticula.  2.  Small sessile polyp removed by hot snare from the proximal right colon.  3.  Normal terminal ileum.   RECOMMENDATIONS:  1.  Await pathology results.  2.  Avoid all nonsteroidals including aspirin for the next two weeks.  3.  Repeat colonoscopy depending on pathology results.  4.  Continue high fiber diet with liberal fluid intake.  5.  Proceed with abdominal ultrasound and HIDA scan as advised.  6.  Outpatient followup as need arises in the future.      Anselmo Rod, M.D.  Electronically Signed     JNM/MEDQ  D:  07/23/2005  T:  07/23/2005  Job:  [id number]   cc:   Margaretmary Bayley, M.D.  Fax: [phone]

## 2010-11-12 ENCOUNTER — Encounter: Payer: Self-pay | Admitting: Internal Medicine

## 2010-11-12 ENCOUNTER — Ambulatory Visit (INDEPENDENT_AMBULATORY_CARE_PROVIDER_SITE_OTHER): Payer: 59 | Admitting: Internal Medicine

## 2010-11-12 DIAGNOSIS — J309 Allergic rhinitis, unspecified: Secondary | ICD-10-CM

## 2010-11-12 MED ORDER — PREDNISONE 10 MG PO TABS: ORAL_TABLET | ORAL | Status: AC

## 2010-11-12 MED ORDER — MONTELUKAST SODIUM 10 MG PO TABS: 10.0000 mg | ORAL_TABLET | Freq: Every day | ORAL | Status: DC

## 2010-11-12 NOTE — Assessment & Plan Note (Signed)
6 days history of increasing respiratory symptoms, exam show slightly bulged tympanic membrane on the right. She is also slightly tender at the right sinus area. She finished it a 10 day course of Avelox a few days ago. At this point, I will treat her for allergies, add singulair,  I doubt she has an acute sinusitis, if she is not improving after the treatment, we'll  consider another round of antibiotics. Patient knows to call me if no better, see instructions.

## 2010-11-12 NOTE — Progress Notes (Signed)
  Subjective:    Patient ID: Katelyn Reyes, female    DOB: 08/28/1960, 50 y.o.   MRN: [id number]  HPI She was seen in in this office was March 2012, diagnosed with sinusitis, was prescribed antibiotics. Since then, she has seen ENT, was diagnosed with a deviated septum, a CT was ordered and is pending. ENT also prescribed Avelox late April, she finished it a few days ago. 6 days ago developed sinus pain and pressure, some right ear congestion as well.  Past Medical History  Diagnosis Date  . Allergic rhinitis   . Depression   . Hyperlipidemia   . IUD 02/07/10   Past Surgical History  Procedure Date  . Mouth surgery       Review of Systems No fever, no nausea, vomiting, diarrhea. She has ongoing itchy eyes and itchy nose. She is taking Tessalon Perles are not Colace syrup left over for cough. Some chest congestion, wheezing? She did take albuterol from somebody else and helped some. No history of asthma     Objective:   Physical Exam  Constitutional: She appears well-developed. No distress.  HENT:  Head: Normocephalic and atraumatic.       Face symmetric, question of tenderness to palpation (mild) at the right maxillary and frontal sinuses. Left tympanic membrane normal, right tympanic membrane slightly bulging, no red or l discharge. Nose is slightly congested. Throat without redness or discharge. Uvula midline.  Neck: Normal range of motion. Neck supple.  Cardiovascular: Normal rate, regular rhythm and normal heart sounds.   No murmur heard. Pulmonary/Chest: Effort normal and breath sounds normal. No respiratory distress. She has no wheezes. She has no rales.  Skin: She is not diaphoretic.          Assessment & Plan:

## 2010-11-12 NOTE — Patient Instructions (Signed)
Start singulair Prednisone x few days as prescribed mucinex DM for cough Call if no better in few days or if symptoms resurface

## 2010-12-04 ENCOUNTER — Other Ambulatory Visit (HOSPITAL_COMMUNITY): Payer: Self-pay | Admitting: Otolaryngology

## 2010-12-04 ENCOUNTER — Ambulatory Visit (HOSPITAL_COMMUNITY)
Admission: RE | Admit: 2010-12-04 | Discharge: 2010-12-04 | Disposition: A | Discharge status: Home / Self Care | Payer: 59 | Source: Ambulatory Visit | Attending: Otolaryngology | Admitting: Otolaryngology

## 2010-12-04 DIAGNOSIS — J3489 Other specified disorders of nose and nasal sinuses: Secondary | ICD-10-CM | POA: Insufficient documentation

## 2010-12-04 DIAGNOSIS — Z01818 Encounter for other preprocedural examination: Secondary | ICD-10-CM | POA: Insufficient documentation

## 2010-12-04 DIAGNOSIS — R51 Headache: Secondary | ICD-10-CM | POA: Insufficient documentation

## 2010-12-04 DIAGNOSIS — J329 Chronic sinusitis, unspecified: Secondary | ICD-10-CM | POA: Insufficient documentation

## 2010-12-14 ENCOUNTER — Other Ambulatory Visit: Payer: Self-pay | Admitting: Otolaryngology

## 2010-12-22 ENCOUNTER — Ambulatory Visit (HOSPITAL_COMMUNITY)
Admission: RE | Admit: 2010-12-22 | Discharge: 2010-12-22 | Disposition: A | Discharge status: Home / Self Care | Payer: 59 | Source: Ambulatory Visit | Attending: Otolaryngology | Admitting: Otolaryngology

## 2010-12-22 ENCOUNTER — Other Ambulatory Visit: Payer: Self-pay | Admitting: Otolaryngology

## 2010-12-22 DIAGNOSIS — J329 Chronic sinusitis, unspecified: Secondary | ICD-10-CM

## 2010-12-22 DIAGNOSIS — J3489 Other specified disorders of nose and nasal sinuses: Secondary | ICD-10-CM | POA: Insufficient documentation

## 2010-12-22 DIAGNOSIS — Z09 Encounter for follow-up examination after completed treatment for conditions other than malignant neoplasm: Secondary | ICD-10-CM | POA: Insufficient documentation

## 2010-12-25 ENCOUNTER — Other Ambulatory Visit: Payer: Self-pay | Admitting: Otolaryngology

## 2010-12-25 ENCOUNTER — Ambulatory Visit (HOSPITAL_BASED_OUTPATIENT_CLINIC_OR_DEPARTMENT_OTHER)
Admission: RE | Admit: 2010-12-25 | Discharge: 2010-12-25 | Disposition: A | Discharge status: Home / Self Care | Payer: 59 | Source: Ambulatory Visit | Attending: Otolaryngology | Admitting: Otolaryngology

## 2010-12-25 DIAGNOSIS — J988 Other specified respiratory disorders: Secondary | ICD-10-CM | POA: Insufficient documentation

## 2010-12-25 DIAGNOSIS — J329 Chronic sinusitis, unspecified: Secondary | ICD-10-CM | POA: Insufficient documentation

## 2010-12-25 DIAGNOSIS — Z01812 Encounter for preprocedural laboratory examination: Secondary | ICD-10-CM | POA: Insufficient documentation

## 2010-12-25 DIAGNOSIS — J342 Deviated nasal septum: Secondary | ICD-10-CM | POA: Insufficient documentation

## 2010-12-25 DIAGNOSIS — J343 Hypertrophy of nasal turbinates: Secondary | ICD-10-CM | POA: Insufficient documentation

## 2010-12-27 LAB — POCT HEMOGLOBIN-HEMACUE: Hemoglobin: 14.1 g/dL (ref 12.0–15.0)

## 2011-01-07 NOTE — Op Note (Addendum)
Katelyn Reyes, Katelyn Reyes              ACCOUNT NO.:  [id number]  MEDICAL RECORD NO.:  [id number]  LOCATION:                                 FACILITY:  PHYSICIAN:  Kinnie Scales. Annalee Genta, M.D.DATE OF BIRTH:  1961-02-02  DATE OF PROCEDURE:  12/25/2010 DATE OF DISCHARGE:                              OPERATIVE REPORT   PREOPERATIVE DIAGNOSES: 1. Chronic sinusitis. 2. Nasal septal deviation with airway obstruction. 3. Inferior turbinate hypertrophy.  POSTOPERATIVE DIAGNOSES: 1. Chronic sinusitis. 2. Nasal septal deviation with airway obstruction. 3. Inferior turbinate hypertrophy.  INDICATIONS FOR SURGERY: 1. Chronic sinusitis. 2. Nasal septal deviation with airway obstruction. 3. Inferior turbinate hypertrophy.  SURGICAL PROCEDURES: 1. Bilateral endoscopic sinus surgery consisting of total     ethmoidectomy and maxillary antrostomy with removal of diseased     tissue. 2. Nasal septoplasty. 3. Bilateral inferior turbinate reduction.  ANESTHESIA:  General endotracheal.  SURGEON:  Kinnie Scales. Annalee Genta, MD  COMPLICATIONS:  None.  ESTIMATED BLOOD LOSS:  Approximately 150 mL.  The patient transferred from the operating room to recovery room in stable condition.  Bilateral nasal septal splints placed.  No nasal packing.  BRIEF HISTORY:  The patient is a 50 year old black female who was referred to our office for evaluation of recurrent sinus issues. Examination in the office revealed severely deviated septum, turbinate hypertrophy, and CT scanning showed bilateral maxillary sinus, mucosal thickening consistent with chronic infection.  The patient had been treated with multiple courses of oral antibiotics, oral and topical nasal steroids and saline nasal irrigation and despite appropriate and aggressive medical therapy was unable to clear her sinus disease.  Given her history, examination and findings including followup CT scan, I recommended to undertake the above surgical  procedures.  The risk, benefits, and possible complications of this procedure were discussed in detail with the patient and her family.  They understood and concurred with our plan for surgery which was scheduled as an outpatient under general anesthesia on December 25, 2010.  PROCEDURE:  The patient was brought to the operating room, placed in supine position on the operating table.  General endotracheal anesthesia was established without difficulty and the patient was adequately anesthetized.  She was positioned on the operating table and prepped and draped in sterile fashion.  Her nose was injected with a total of 7 mL of 1% lidocaine in 1:100,000 solution of epinephrine which was injected in submucosal fashion on the nasal septum, inferior turbinates, lateral nasal wall, uncinate process and middle turbinates bilaterally.  She was then packed with Afrin-soaked cottonoid pledgets which were left in place for approximately 10 minutes to allow for vasoconstriction hemostasis.  The patient was adequately prepared for surgery.  Right endoscopic sinus surgery was undertaken.  The middle turbinate was carefully medialized. Uncinate process reflected anteriorly and then resected.  Using a 0 degree scope and a straight microdebrider, total ethmoidectomy was performed with dissection along the floor of the ethmoid sinus removing bony septations and diseased mucosa.  Posterior-superior aspect of the sinus was identified and using a 45-degree telescope and curved microdebrider dissection then carried out from posterior to anterior removing bony septations.  Lateral nasal wall was examined.  Natural ostia of  the maxillary sinus was occluded with mucosal disease which was resected with a curved microdebrider.  The sinus was cleared of infection and the sinus ostium was enlarged to create a widely patent maxillary ostium.  Attention was turned to the left-hand side.  The same procedure was carried  out.  Using a 0 degree telescope, total ethmoidectomy was performed, dissection from anterior to posterior, 45 degrees telescope and a curved microdebrider along the roof of the ethmoid sinus second from posterior to anterior to clear the entire ethmoid region.  Nasal frontal recess was identified but was not implemented.  Attention was then turned to lateral nasal wall where the uncinate process was fully resected.  Natural ostia of the maxillary sinus identified.  There was a accessory posterior ostium and the intervening soft tissue was resected. Sinus was cleared of thick mucous material and the maxillary antrostomy was widened to create a patent maxillary ostium.  Nasal septoplasty was then performed.  Left anterior hemitransfixion incision was created and a mucoperichondrial flap was elevated from anterior to posterior on the left-hand side.  The cartilaginous septum was crossed in the midline and the mucoperiosteal flap was elevated on the right-hand side.  Deviated bone and cartilage in the mid and posterior aspect of nasal septum were then resected.  The resected cartilage was morselized and returned to the mucoperichondrial pocket and the flaps were reapproximated with a 4-0 gut suture on a Keith needle in horizontal mattress fashion.  At the conclusion of the procedure, bilateral Doyle nasal septal splints were placed after the application of Bactroban ointment and were sutured into position with a 3-0 Ethilon suture.  Inferior turbinate reduction was performed with bipolar cautery set at 12 watts.  Intramural cautery was undertaken in a submucosal fashion in each inferior turbinate.  When the inferior turbinates had been adequately cauterized, anterior incisions were created, overlying soft tissue elevated and a small amount of turbinate bone was resected.  The turbinates were then outfractured to create a more patent nasal cavity.  Nasal cavity, nasopharynx, and oral  cavity were irrigated and suctioned. The orogastric tube was passed.  Stomach contents were aspirated.  There was no nasal packing placed.  The patient was then awakened from anesthetic, extubated, and transferred from the operating room to the recovery room in stable condition.  No complications and blood loss was approximately 150 mL.          ______________________________ Kinnie Scales. Annalee Genta, M.D.     DLS/MEDQ  D:  96/09/5407  T:  12/25/2010  Job:  [id number]  Electronically Signed by Osborn Coho M.D. on 03/07/2011 12:25:40 PM

## 2011-03-14 ENCOUNTER — Telehealth: Payer: Self-pay

## 2011-03-14 ENCOUNTER — Encounter: Payer: Self-pay | Admitting: Family Medicine

## 2011-03-14 ENCOUNTER — Ambulatory Visit (INDEPENDENT_AMBULATORY_CARE_PROVIDER_SITE_OTHER): Payer: 59 | Admitting: Family Medicine

## 2011-03-14 DIAGNOSIS — Z0000 Encounter for general adult medical examination without abnormal findings: Secondary | ICD-10-CM

## 2011-03-14 DIAGNOSIS — E785 Hyperlipidemia, unspecified: Secondary | ICD-10-CM

## 2011-03-14 DIAGNOSIS — Z23 Encounter for immunization: Secondary | ICD-10-CM

## 2011-03-14 LAB — CBC WITH DIFFERENTIAL/PLATELET
Basophils Absolute: 0 K/uL (ref 0.0–0.1)
Basophils Relative: 0.4 % (ref 0.0–3.0)
Eosinophils Absolute: 0.2 K/uL (ref 0.0–0.7)
Eosinophils Relative: 2.9 % (ref 0.0–5.0)
HCT: 38.7 % (ref 36.0–46.0)
Hemoglobin: 12.7 g/dL (ref 12.0–15.0)
Lymphocytes Relative: 42.2 % (ref 12.0–46.0)
Lymphs Abs: 2.8 K/uL (ref 0.7–4.0)
MCHC: 32.8 g/dL (ref 30.0–36.0)
MCV: 94.7 fl (ref 78.0–100.0)
Monocytes Absolute: 0.5 K/uL (ref 0.1–1.0)
Monocytes Relative: 7.1 % (ref 3.0–12.0)
Neutro Abs: 3.1 K/uL (ref 1.4–7.7)
Neutrophils Relative %: 47.4 % (ref 43.0–77.0)
Platelets: 318 K/uL (ref 150.0–400.0)
RBC: 4.09 Mil/uL (ref 3.87–5.11)
RDW: 13.1 % (ref 11.5–14.6)
WBC: 6.6 K/uL (ref 4.5–10.5)

## 2011-03-14 LAB — LIPID PANEL
Cholesterol: 172 mg/dL (ref 0–200)
HDL: 52.4 mg/dL (ref >39.00)
LDL Cholesterol: 103 mg/dL — ABNORMAL HIGH (ref 0–99)
Total CHOL/HDL Ratio: 3
Triglycerides: 83 mg/dL (ref 0.0–149.0)
VLDL: 16.6 mg/dL (ref 0.0–40.0)

## 2011-03-14 LAB — HEPATIC FUNCTION PANEL
ALT: 23 U/L (ref 0–35)
AST: 24 U/L (ref 0–37)
Albumin: 3.9 g/dL (ref 3.5–5.2)
Alkaline Phosphatase: 78 U/L (ref 39–117)
Bilirubin, Direct: 0 mg/dL (ref 0.0–0.3)
Total Bilirubin: 0.3 mg/dL (ref 0.3–1.2)
Total Protein: 7.4 g/dL (ref 6.0–8.3)

## 2011-03-14 LAB — BASIC METABOLIC PANEL
BUN: 11 mg/dL (ref 6–23)
CO2: 27 mEq/L (ref 19–32)
Calcium: 9.1 mg/dL (ref 8.4–10.5)
Chloride: 107 mEq/L (ref 96–112)
Creatinine, Ser: 0.8 mg/dL (ref 0.4–1.2)
GFR: 99.99 mL/min (ref >60.00)
Glucose, Bld: 113 mg/dL — ABNORMAL HIGH (ref 70–99)
Potassium: 4.4 mEq/L (ref 3.5–5.1)
Sodium: 140 mEq/L (ref 135–145)

## 2011-03-14 LAB — TSH: TSH: 1.1 u[IU]/mL (ref 0.35–5.50)

## 2011-03-14 MED ORDER — SERTRALINE HCL 100 MG PO TABS: 100.0000 mg | ORAL_TABLET | Freq: Every day | ORAL | Status: DC

## 2011-03-14 NOTE — Telephone Encounter (Signed)
Left message for pt to call back  °

## 2011-03-14 NOTE — Progress Notes (Signed)
  Subjective:    Patient ID: Katelyn Reyes, female    DOB: 1960/10/31, 50 y.o.   MRN: [id number]  HPI CPE- no concerns about her health.  GYN- Dr Stefano Gaul, UTD on mammo, pap.  UTD colonoscopy   Review of Systems Patient reports no vision/ hearing changes, adenopathy,fever, weight change, persistant/recurrent hoarseness , swallowing issues, chest pain, palpitations, edema, persistant/recurrent cough, hemoptysis, dyspnea (rest/exertional/paroxysmal nocturnal), gastrointestinal bleeding (melena, rectal bleeding), abdominal pain, significant heartburn, bowel changes, GU symptoms (dysuria, hematuria, incontinence), Gyn symptoms (abnormal  bleeding, pain),  syncope, focal weakness, memory loss, numbness & tingling, skin/hair/nail changes, abnormal bruising or bleeding, anxiety, or depression.     Objective:   Physical Exam  General Appearance:    Alert, cooperative, no distress, appears stated age  Head:    Normocephalic, without obvious abnormality, atraumatic  Eyes:    PERRL, conjunctiva/corneas clear, EOM's intact, fundi    benign, both eyes  Ears:    Normal TM's and external ear canals, both ears  Nose:   Nares normal, septum midline, mucosa normal, no drainage    or sinus tenderness  Throat:   Lips, mucosa, and tongue normal; teeth and gums normal  Neck:   Supple, symmetrical, trachea midline, no adenopathy;    Thyroid: no enlargement/tenderness/nodules  Back:     Symmetric, no curvature, ROM normal, no CVA tenderness  Lungs:     Clear to auscultation bilaterally, respirations unlabored  Chest Wall:    No tenderness or deformity   Heart:    Regular rate and rhythm, S1 and S2 normal, no murmur, rub   or gallop  Breast Exam:    Deferred to GYN  Abdomen:     Soft, non-tender, bowel sounds active all four quadrants,    no masses, no organomegaly  Genitalia:    Deferred to GYN  Rectal:    Extremities:   Extremities normal, atraumatic, no cyanosis or edema  Pulses:   2+ and symmetric  all extremities  Skin:   Skin color, texture, turgor normal, no rashes or lesions  Lymph nodes:   Cervical, supraclavicular, and axillary nodes normal  Neurologic:   CNII-XII intact, normal strength, sensation and reflexes    throughout          Assessment & Plan:

## 2011-03-14 NOTE — Progress Notes (Signed)
Quick Note:  Left message for pt to call back ______ 

## 2011-03-14 NOTE — Telephone Encounter (Signed)
Message copied by Beverely Low on Fri Mar 14, 2011  4:58 PM ------      Message from: Sheliah Hatch      Created: Fri Mar 14, 2011  3:17 PM       Labs look good w/ exception of glucose which is elevated at 113.  Please focus on healthy diet and regular exercise to avoid this progressing to diabetes.

## 2011-03-14 NOTE — Patient Instructions (Signed)
Follow up in 6 months to recheck weight loss progress We'll notify you of your lab results Call with any questions or concerns Keep up the good work! Happy Fall!

## 2011-03-14 NOTE — Assessment & Plan Note (Addendum)
PE WNL w/ exception of weight.  Strongly encouraged healthy diet and regular exercise.  Check labs.  UTD on GYN, colonoscopy.  Due for 2nd dose of Hep A.  EKG done as baseline- see document for interpretation.

## 2011-03-14 NOTE — Assessment & Plan Note (Signed)
Check labs.  Pt has been trying to control w/ diet and exercise.  Will start meds prn.

## 2011-03-17 NOTE — Progress Notes (Signed)
Quick Note:    Labs mailed.  ______

## 2011-03-18 ENCOUNTER — Telehealth: Payer: Self-pay

## 2011-03-18 NOTE — Telephone Encounter (Signed)
Returned call to pt. Pt aware of lab results

## 2011-03-19 LAB — VITAMIN D 1,25 DIHYDROXY
Vitamin D 1, 25 (OH)2 Total: 52 pg/mL (ref 18–72)
Vitamin D2 1, 25 (OH)2: 19 pg/mL
Vitamin D3 1, 25 (OH)2: 33 pg/mL

## 2011-03-19 NOTE — Progress Notes (Signed)
Quick Note:    Labs mailed.  ______

## 2011-06-23 ENCOUNTER — Telehealth: Payer: Self-pay | Admitting: *Deleted

## 2011-06-23 NOTE — Telephone Encounter (Signed)
Spoke to pt to advise results/instructions. Pt understood. Pt wanted me to review the chart to make sure no cough medication was called in  Advised pt that chart noted on 11-12-10 pt saw MD Drue Novel and his discharge notes state for her to take OTC muccinex DM Pt advised this is not working for her, I offered pt office visit, she advised that she is not off from work until Friday, I offered a visit for Friday for pt, she advised that she hopes to be better by then but will call back if she needs to, also advised about UC/ER if s/s worsen

## 2011-06-23 NOTE — Telephone Encounter (Signed)
No cough med noted in chart.  Can take OTC delsym and mucinex.  Any additional meds would require OV

## 2011-06-23 NOTE — Telephone Encounter (Signed)
Pt left vm to advise that she has a hacking cough, sneezing and runny nose, she wanted to get a refill for the cough syrup that she was given for prior visit. Noted last OV 03-14-11 noted a OV with Paz on 11-12-10 was advised to take Muccinex Dm on this visit, no cough medication noted in history

## 2011-07-04 ENCOUNTER — Other Ambulatory Visit (HOSPITAL_COMMUNITY): Payer: Self-pay | Admitting: Obstetrics and Gynecology

## 2011-07-04 DIAGNOSIS — Z1231 Encounter for screening mammogram for malignant neoplasm of breast: Secondary | ICD-10-CM

## 2011-08-07 ENCOUNTER — Ambulatory Visit (HOSPITAL_COMMUNITY)
Admission: RE | Admit: 2011-08-07 | Discharge: 2011-08-07 | Disposition: A | Discharge status: Home / Self Care | Payer: 59 | Source: Ambulatory Visit | Attending: Obstetrics and Gynecology | Admitting: Obstetrics and Gynecology

## 2011-08-07 DIAGNOSIS — Z1231 Encounter for screening mammogram for malignant neoplasm of breast: Secondary | ICD-10-CM | POA: Insufficient documentation

## 2012-03-16 ENCOUNTER — Encounter: Payer: 59 | Admitting: Family Medicine

## 2012-03-17 ENCOUNTER — Telehealth: Payer: Self-pay | Admitting: Family Medicine

## 2012-03-17 NOTE — Telephone Encounter (Signed)
Patient would like to come in before her appointment at 10:30 tomorrow and get her CPE labs drawn. She has a class directly after this appt and is trying to save time. What labs will she need?

## 2012-03-17 NOTE — Telephone Encounter (Signed)
Please advise if pt needs any labs not on the CPE list,

## 2012-03-17 NOTE — Telephone Encounter (Signed)
CBC w/ diff LFTs Lipid panel BMP TSH Vit D

## 2012-03-18 ENCOUNTER — Encounter: Payer: Self-pay | Admitting: *Deleted

## 2012-03-18 ENCOUNTER — Encounter: Payer: Self-pay | Admitting: Family Medicine

## 2012-03-18 ENCOUNTER — Ambulatory Visit (INDEPENDENT_AMBULATORY_CARE_PROVIDER_SITE_OTHER): Payer: 59 | Admitting: Family Medicine

## 2012-03-18 VITALS — BP 127/85 | HR 85 | Temp 98.6°F | Ht 62.0 in | Wt 200.8 lb

## 2012-03-18 DIAGNOSIS — E785 Hyperlipidemia, unspecified: Secondary | ICD-10-CM

## 2012-03-18 DIAGNOSIS — Z0000 Encounter for general adult medical examination without abnormal findings: Secondary | ICD-10-CM

## 2012-03-18 DIAGNOSIS — E049 Nontoxic goiter, unspecified: Secondary | ICD-10-CM

## 2012-03-18 DIAGNOSIS — E010 Iodine-deficiency related diffuse (endemic) goiter: Secondary | ICD-10-CM

## 2012-03-18 LAB — LIPID PANEL
Cholesterol: 162 mg/dL (ref 0–200)
HDL: 48.8 mg/dL (ref >39.00)
LDL Cholesterol: 93 mg/dL (ref 0–99)
Total CHOL/HDL Ratio: 3
Triglycerides: 100 mg/dL (ref 0.0–149.0)
VLDL: 20 mg/dL (ref 0.0–40.0)

## 2012-03-18 LAB — CBC WITH DIFFERENTIAL/PLATELET
Basophils Absolute: 0 10*3/uL (ref 0.0–0.1)
Basophils Relative: 0.3 % (ref 0.0–3.0)
Eosinophils Absolute: 0.2 10*3/uL (ref 0.0–0.7)
Eosinophils Relative: 3.2 % (ref 0.0–5.0)
HCT: 39.5 % (ref 36.0–46.0)
Hemoglobin: 13 g/dL (ref 12.0–15.0)
Lymphocytes Relative: 44.8 % (ref 12.0–46.0)
Lymphs Abs: 3 10*3/uL (ref 0.7–4.0)
MCHC: 32.8 g/dL (ref 30.0–36.0)
MCV: 94.6 fl (ref 78.0–100.0)
Monocytes Absolute: 0.4 10*3/uL (ref 0.1–1.0)
Monocytes Relative: 5.7 % (ref 3.0–12.0)
Neutro Abs: 3 10*3/uL (ref 1.4–7.7)
Neutrophils Relative %: 46 % (ref 43.0–77.0)
Platelets: 314 10*3/uL (ref 150.0–400.0)
RBC: 4.18 Mil/uL (ref 3.87–5.11)
RDW: 13.4 % (ref 11.5–14.6)
WBC: 6.6 10*3/uL (ref 4.5–10.5)

## 2012-03-18 LAB — BASIC METABOLIC PANEL
BUN: 10 mg/dL (ref 6–23)
CO2: 26 mEq/L (ref 19–32)
Calcium: 8.8 mg/dL (ref 8.4–10.5)
Chloride: 105 mEq/L (ref 96–112)
Creatinine, Ser: 0.9 mg/dL (ref 0.4–1.2)
GFR: 89.08 mL/min (ref >60.00)
Glucose, Bld: 91 mg/dL (ref 70–99)
Potassium: 3.7 mEq/L (ref 3.5–5.1)
Sodium: 137 mEq/L (ref 135–145)

## 2012-03-18 LAB — HEPATIC FUNCTION PANEL
ALT: 23 U/L (ref 0–35)
AST: 20 U/L (ref 0–37)
Albumin: 3.8 g/dL (ref 3.5–5.2)
Alkaline Phosphatase: 64 U/L (ref 39–117)
Bilirubin, Direct: 0 mg/dL (ref 0.0–0.3)
Total Bilirubin: 0.6 mg/dL (ref 0.3–1.2)
Total Protein: 7.2 g/dL (ref 6.0–8.3)

## 2012-03-18 LAB — TSH: TSH: 1.91 u[IU]/mL (ref 0.35–5.50)

## 2012-03-18 MED ORDER — ZOLPIDEM TARTRATE 10 MG PO TABS: 10.0000 mg | ORAL_TABLET | Freq: Every evening | ORAL | Status: DC | PRN

## 2012-03-18 MED ORDER — SERTRALINE HCL 100 MG PO TABS: 100.0000 mg | ORAL_TABLET | Freq: Every day | ORAL | Status: DC

## 2012-03-18 MED ORDER — MOMETASONE FUROATE 50 MCG/ACT NA SUSP: 2.0000 | Freq: Every day | NASAL | Status: DC

## 2012-03-18 MED ORDER — MONTELUKAST SODIUM 10 MG PO TABS: 10.0000 mg | ORAL_TABLET | Freq: Every day | ORAL | Status: DC

## 2012-03-18 MED ORDER — CETIRIZINE HCL 10 MG PO TABS: 10.0000 mg | ORAL_TABLET | Freq: Every day | ORAL | Status: DC

## 2012-03-18 MED ORDER — AZELASTINE HCL 0.1 % NA SOLN: 1.0000 | Freq: Two times a day (BID) | NASAL | Status: DC

## 2012-03-18 MED ORDER — ALBUTEROL SULFATE HFA 108 (90 BASE) MCG/ACT IN AERS: 2.0000 | INHALATION_SPRAY | Freq: Four times a day (QID) | RESPIRATORY_TRACT | Status: DC | PRN

## 2012-03-18 NOTE — Assessment & Plan Note (Signed)
Chronic problem, attempting to control w/ diet and exercise.  Check labs.  Start meds prn. 

## 2012-03-18 NOTE — Assessment & Plan Note (Signed)
PE WNL w/ exception of thyromegaly.  UTD on GYN.  Check labs.  Anticipatory guidance provided.

## 2012-03-18 NOTE — Patient Instructions (Addendum)
Follow up in 6 months to recheck cholesterol We'll notify you of your lab results and make any changes if needed Someone will call you with your ultrasound appt Call with any questions or concerns Congrats on the new romance!!!

## 2012-03-18 NOTE — Assessment & Plan Note (Signed)
New.  Check labs.  [place].

## 2012-03-18 NOTE — Progress Notes (Signed)
  Subjective:    Patient ID: Katelyn Reyes, female    DOB: 12/28/1960, 51 y.o.   MRN: [id number]  HPI CPE- has GYN, needs med refills.  No concerns.   Review of Systems Patient reports no vision/ hearing changes, adenopathy,fever, weight change,  persistant/recurrent hoarseness , swallowing issues, chest pain, palpitations, edema, persistant/recurrent cough, hemoptysis, dyspnea (rest/exertional/paroxysmal nocturnal), gastrointestinal bleeding (melena, rectal bleeding), abdominal pain, significant heartburn, bowel changes, GU symptoms (dysuria, hematuria, incontinence), Gyn symptoms (abnormal  bleeding, pain),  syncope, focal weakness, memory loss, numbness & tingling, skin/hair/nail changes, abnormal bruising or bleeding, anxiety, or depression.     Objective:   Physical Exam General Appearance:    Alert, cooperative, no distress, appears stated age  Head:    Normocephalic, without obvious abnormality, atraumatic  Eyes:    PERRL, conjunctiva/corneas clear, EOM's intact, fundi    benign, both eyes  Ears:    Normal TM's and external ear canals, both ears  Nose:   Nares normal, septum midline, mucosa normal, no drainage    or sinus tenderness  Throat:   Lips, mucosa, and tongue normal; teeth and gums normal  Neck:   Supple, symmetrical, trachea midline, no adenopathy;    Thyroid: diffuse enlargement, no tenderness/nodules  Back:     Symmetric, no curvature, ROM normal, no CVA tenderness  Lungs:     Clear to auscultation bilaterally, respirations unlabored  Chest Wall:    No tenderness or deformity   Heart:    Regular rate and rhythm, S1 and S2 normal, no murmur, rub   or gallop  Breast Exam:    Deferred to GYN  Abdomen:     Soft, non-tender, bowel sounds active all four quadrants,    no masses, no organomegaly  Genitalia:    Deferred to GYN  Rectal:    Extremities:   Extremities normal, atraumatic, no cyanosis or edema  Pulses:   2+ and symmetric all extremities  Skin:   Skin color,  texture, turgor normal, no rashes or lesions  Lymph nodes:   Cervical, supraclavicular, and axillary nodes normal  Neurologic:   CNII-XII intact, normal strength, sensation and reflexes    throughout          Assessment & Plan:

## 2012-03-18 NOTE — Telephone Encounter (Signed)
Pt was treated by MD Beverely Low today, labs were drawn

## 2012-03-19 ENCOUNTER — Ambulatory Visit (HOSPITAL_COMMUNITY): Payer: 59

## 2012-03-19 ENCOUNTER — Other Ambulatory Visit (HOSPITAL_COMMUNITY): Payer: 59

## 2012-03-23 LAB — VITAMIN D 1,25 DIHYDROXY
Vitamin D 1, 25 (OH)2 Total: 63 pg/mL (ref 18–72)
Vitamin D2 1, 25 (OH)2: 17 pg/mL
Vitamin D3 1, 25 (OH)2: 46 pg/mL

## 2012-03-25 ENCOUNTER — Other Ambulatory Visit (HOSPITAL_COMMUNITY): Payer: 59

## 2012-03-25 ENCOUNTER — Ambulatory Visit (HOSPITAL_COMMUNITY)
Admission: RE | Admit: 2012-03-25 | Discharge: 2012-03-25 | Disposition: A | Discharge status: Home / Self Care | Payer: 59 | Source: Ambulatory Visit | Attending: Family Medicine | Admitting: Family Medicine

## 2012-03-25 DIAGNOSIS — E010 Iodine-deficiency related diffuse (endemic) goiter: Secondary | ICD-10-CM

## 2012-03-25 DIAGNOSIS — E041 Nontoxic single thyroid nodule: Secondary | ICD-10-CM | POA: Insufficient documentation

## 2012-03-26 ENCOUNTER — Other Ambulatory Visit (HOSPITAL_COMMUNITY): Payer: 59

## 2012-05-19 ENCOUNTER — Ambulatory Visit (INDEPENDENT_AMBULATORY_CARE_PROVIDER_SITE_OTHER): Payer: 59 | Admitting: Family

## 2012-05-19 ENCOUNTER — Encounter: Payer: Self-pay | Admitting: Family

## 2012-05-19 VITALS — BP 104/78 | HR 107 | Temp 100.0°F | Resp 18 | Wt 202.0 lb

## 2012-05-19 DIAGNOSIS — J020 Streptococcal pharyngitis: Secondary | ICD-10-CM

## 2012-05-19 DIAGNOSIS — J029 Acute pharyngitis, unspecified: Secondary | ICD-10-CM

## 2012-05-19 LAB — POCT RAPID STREP A (OFFICE): Rapid Strep A Screen: POSITIVE — AB

## 2012-05-19 MED ORDER — AMOXICILLIN 500 MG PO CAPS: 1000.0000 mg | ORAL_CAPSULE | Freq: Three times a day (TID) | ORAL | Status: DC

## 2012-05-19 NOTE — Patient Instructions (Addendum)

## 2012-05-19 NOTE — Progress Notes (Signed)
Subjective:    Patient ID: Katelyn Reyes, female    DOB: December 24, 1960, 50 y.o.   MRN: [id number]  HPI  [name] is a 51 yr old female who presents today with chief complaint of sore throat.  She reports associated cough.  Symptoms started 3 days ago.  Also reports HA and bilateral ear pain.  She has not taken any medications today.  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     Review of Systems See HPI  Past Medical History  Diagnosis Date  . Allergic rhinitis   . Depression   . Hyperlipidemia   . IUD 02/07/10    History   Social History  . Marital Status: Married    Spouse Name: N/A    Number of Children: N/A  . Years of Education: N/A   Occupational History  . Not on file.   Social History Main Topics  . Smoking status: Never Smoker   . Smokeless tobacco: Not on file  . Alcohol Use: Not on file  . Drug Use: Not on file  . Sexually Active: Not on file   Other Topics Concern  . Not on file   Social History Narrative  . No narrative on file    Past Surgical History  Procedure Date  . Mouth surgery     Family History  Problem Relation Age of Onset  . Coronary artery disease Neg Hx   . Hypertension Father   . Diabetes Father   . Stroke Maternal Grandmother   . Colon cancer Paternal Grandmother   . Breast cancer      aunt  . Fibrocystic breast disease Mother     Allergies  Allergen Reactions  . Erythromycin   . Hydrocodone-Acetaminophen     REACTION: hallucinations    Current Outpatient Prescriptions on File Prior to Visit  Medication Sig Dispense Refill  . albuterol (PROVENTIL HFA;VENTOLIN HFA) 108 (90 BASE) MCG/ACT inhaler Inhale 2 puffs into the lungs every 6 (six) hours as needed for wheezing.  1 Inhaler  6  . azelastine (ASTELIN) 137 MCG/SPRAY nasal spray Place 1 spray into the nose 2 (two) times daily. Use in each nostril as directed  30 mL  6  . cetirizine (ZYRTEC) 10 MG tablet Take 1 tablet (10 mg total) by mouth daily.  90 tablet  3  . levonorgestrel  (MIRENA) 20 MCG/24HR IUD 1 each by Intrauterine route once.        . mometasone (NASONEX) 50 MCG/ACT nasal spray Place 2 sprays into the nose daily.  17 g  6  . montelukast (SINGULAIR) 10 MG tablet Take 1 tablet (10 mg total) by mouth daily.  90 tablet  3  . sertraline (ZOLOFT) 100 MG tablet Take 1 tablet (100 mg total) by mouth daily.  90 tablet  3  . [DISCONTINUED] zolpidem (AMBIEN) 10 MG tablet Take 1 tablet (10 mg total) by mouth at bedtime as needed for sleep.  30 tablet  1    BP 104/78  Pulse 107  Temp 100 F (37.8 C) (Oral)  Resp 18  Wt 202 lb (91.627 kg)  SpO2 97%       Objective:   Physical Exam  Constitutional: She is oriented to person, place, and time. She appears well-developed and well-nourished. No distress.  HENT:  Head: Normocephalic and atraumatic.  Right Ear: Tympanic membrane and ear canal normal.  Left Ear: Tympanic membrane and ear canal normal.  Mouth/Throat: Posterior oropharyngeal edema and posterior oropharyngeal erythema present. No  oropharyngeal exudate or tonsillar abscesses.  Cardiovascular: Normal rate and regular rhythm.   No murmur heard. Pulmonary/Chest: Effort normal and breath sounds normal. No respiratory distress. She has no wheezes. She has no rales. She exhibits no tenderness.  Lymphadenopathy:    She has cervical adenopathy.  Neurological: She is alert and oriented to person, place, and time.  Skin: Skin is warm and dry.  Psychiatric: She has a normal mood and affect. Her behavior is normal. Judgment and thought content normal.          Assessment & Plan:  Strep Pharyngitis- Rapid strep is positive. Will rx with amoxicillin. Pt instructed to use ibuprofen prn pain.

## 2012-05-25 ENCOUNTER — Encounter: Payer: 59 | Admitting: Family Medicine

## 2012-06-01 ENCOUNTER — Encounter: Payer: Self-pay | Admitting: Family Medicine

## 2012-06-01 ENCOUNTER — Ambulatory Visit (INDEPENDENT_AMBULATORY_CARE_PROVIDER_SITE_OTHER): Payer: 59 | Admitting: Family Medicine

## 2012-06-01 VITALS — BP 110/70 | HR 68 | Temp 98.2°F | Ht 62.0 in | Wt 204.0 lb

## 2012-06-01 DIAGNOSIS — L293 Anogenital pruritus, unspecified: Secondary | ICD-10-CM

## 2012-06-01 DIAGNOSIS — N898 Other specified noninflammatory disorders of vagina: Secondary | ICD-10-CM

## 2012-06-01 MED ORDER — FLUCONAZOLE 150 MG PO TABS: 150.0000 mg | ORAL_TABLET | Freq: Once | ORAL | Status: DC

## 2012-06-01 NOTE — Assessment & Plan Note (Signed)
New.  Given recent abx use will tx for presumed yeast.  Wet prep collected.  Will await results prior to any additional meds.  Pt expressed understanding and is in agreement w/ plan.

## 2012-06-01 NOTE — Progress Notes (Signed)
  Subjective:    Patient ID: Katelyn Reyes, female    DOB: 12/24/1960, 51 y.o.   MRN: [id number]  HPI Vaginal d/c- completed course of Amox on Saturday.  Developed vaginal itching mid way through abx.  No vaginal d/c.  Was eating yogurt but no relief.  No burning.  Having both internal and external itching.  Hx of yeast- feels similar.   Review of Systems For ROS see HPI     Objective:   Physical Exam  Vitals reviewed. Constitutional: She appears well-developed and well-nourished. No distress.  Genitourinary:       External irritation w/ some excoriations present No vaginal d/c seen Wet prep collected          Assessment & Plan:

## 2012-06-01 NOTE — Patient Instructions (Addendum)
We'll notify you of your lab results and change the meds if needed Take the Diflucan today- repeat in 3 days if needed Happy Birthday!!! Happy Holidays!!!

## 2012-06-02 LAB — WET PREP BY MOLECULAR PROBE
Candida species: POSITIVE — AB
Gardnerella vaginalis: POSITIVE — AB
Trichomonas vaginosis: NEGATIVE

## 2012-06-08 ENCOUNTER — Telehealth: Payer: Self-pay | Admitting: Family Medicine

## 2012-06-08 MED ORDER — METRONIDAZOLE 500 MG PO TABS: 500.0000 mg | ORAL_TABLET | Freq: Two times a day (BID) | ORAL | Status: DC

## 2012-06-08 NOTE — Telephone Encounter (Signed)
pt RT call for lab results please call at [phone]

## 2012-06-08 NOTE — Telephone Encounter (Signed)
Message copied by Verdie Shire on Tue Jun 08, 2012 10:23 AM ------      Message from: Sheliah Hatch      Created: Wed Jun 02, 2012  9:28 AM       Pt already being tx'd for yeast but also + for BV.  Needs to start flagyl 500mg  BID x7, #14, no refills

## 2012-06-08 NOTE — Telephone Encounter (Signed)
Spoke with the pt and informed her of recent lab results and note.  Pt understood and agreed.  Pt stated that she's doing better,but she will take the ATB.  Pt asked if we could fax the new rx to her at [facility].  New rx was printed and faxed to pt.//AB/CMA

## 2012-06-28 ENCOUNTER — Ambulatory Visit (INDEPENDENT_AMBULATORY_CARE_PROVIDER_SITE_OTHER): Payer: 59 | Admitting: Family Medicine

## 2012-06-28 ENCOUNTER — Telehealth: Payer: Self-pay | Admitting: Family Medicine

## 2012-06-28 ENCOUNTER — Encounter: Payer: Self-pay | Admitting: Family Medicine

## 2012-06-28 VITALS — BP 102/80 | HR 97 | Temp 98.5°F | Ht 62.25 in | Wt 203.0 lb

## 2012-06-28 DIAGNOSIS — J209 Acute bronchitis, unspecified: Secondary | ICD-10-CM

## 2012-06-28 DIAGNOSIS — J449 Chronic obstructive pulmonary disease, unspecified: Secondary | ICD-10-CM | POA: Insufficient documentation

## 2012-06-28 MED ORDER — AZITHROMYCIN 250 MG PO TABS: ORAL_TABLET | ORAL | Status: DC

## 2012-06-28 MED ORDER — GUAIFENESIN-CODEINE 100-10 MG/5ML PO SYRP: 10.0000 mL | ORAL_SOLUTION | Freq: Three times a day (TID) | ORAL | Status: DC | PRN

## 2012-06-28 NOTE — Progress Notes (Signed)
  Subjective:    Patient ID: Katelyn Reyes, female    DOB: 25-Apr-1961, 52 y.o.   MRN: [id number]  HPI Cough- sxs started Thursday.  No fevers.  No nasal congestion.  + chest congestion.  + cough- dry.  No facial pain/pressure.  No ear pain.  + sick contacts.  Has tried Delsym, Robitussin, Tessalon w/out relief.  Has had increased albuterol use.   Review of Systems For ROS see HPI     Objective:   Physical Exam  Constitutional: She appears well-developed and well-nourished. No distress.  HENT:  Head: Normocephalic and atraumatic.       TMs normal bilaterally Mild nasal congestion Throat w/out erythema, edema, or exudate  Eyes: Conjunctivae normal and EOM are normal. Pupils are equal, round, and reactive to light.  Neck: Normal range of motion. Neck supple.  Cardiovascular: Normal rate, regular rhythm, normal heart sounds and intact distal pulses.   No murmur heard. Pulmonary/Chest: Effort normal and breath sounds normal. No respiratory distress. She has no wheezes.       + hacking cough  Lymphadenopathy:    She has no cervical adenopathy.          Assessment & Plan:

## 2012-06-28 NOTE — Telephone Encounter (Signed)
No appts- can come at 4 today if able (double book)

## 2012-06-28 NOTE — Telephone Encounter (Addendum)
Patient Information:  Caller Name: Faithe  Phone: [phone]  Patient: Katelyn Reyes  Gender: Female  DOB: 24-Dec-1960  Age: 52 Years  PCP: Sheliah Hatch  Pregnant: No  Office Follow Up:  Does the office need to follow up with this patient?: Yes  Instructions For The Office: Please call patient, requesting medication for her cough. She is willing to come in for an appointment today but appointments are full this evening. States she can not come into office tomorrow she has to work.  RN Note:  Patient calling for cough medication but is willing to schedule an appointment if she can be seen this evening.  Symptoms  Reason For Call & Symptoms: non-productive cough that is persistent and keeps her up at night.   Reviewed Health History In EMR: Yes  Reviewed Medications In EMR: Yes  Reviewed Allergies In EMR: Yes  Reviewed Surgeries / Procedures: Yes  Date of Onset of Symptoms: 06/24/2012  Treatments Tried: Robitussin, Delsym, Tessalon Perles, Albuterol  Treatments Tried Worked: No OB / GYN:  LMP: 06/17/2012  Guideline(s) Used:  Cough  Disposition Per Guideline:   See Today or Tomorrow in Office  Reason For Disposition Reached:   Continuous (nonstop) coughing interferes with work or school and no improvement using cough treatment per Care Advice  Advice Given:  Cough Medicines:  OTC Cough Drops: Cough drops can help a lot, especially for mild coughs. They reduce coughing by soothing your irritated throat and removing that tickle sensation in the back of the throat. Cough drops also have the advantage of portability - you can carry them with you.  Home Remedy - Honey: This old home remedy has been shown to help decrease coughing at night. The adult dosage is 2 teaspoons (10 ml) at bedtime. Honey should not be given to infants under one year of age.  Coughing Spasms:  Drink warm fluids. Inhale warm mist (Reason: both relax the airway and loosen up the phlegm).  Prevent  Dehydration:  Drink adequate liquids.  This will help soothe an irritated or dry throat and loosen up the phlegm.  Avoid Tobacco Smoke:  Smoking or being exposed to smoke makes coughs much worse.  Expected Course:   Viral bronchitis (chest cold) causes a cough that lasts 1 to 3 weeks. Sometimes you may cough up lots of phlegm (sputum, mucus). The mucus can normally be white, gray, yellow, or green.  Call Back If:  Difficulty breathing  You become worse.  Pharmacy is [facility] outpatient, closes at 5:30pm today per patient.

## 2012-06-28 NOTE — Patient Instructions (Addendum)
Start the [employer] w/ dinner- take w/ food Use the cough syrup as needed- will cause drowsiness For daytime cough, use Mucinex DM Drink plenty of fluids REST! Hang in there!!!

## 2012-06-28 NOTE — Telephone Encounter (Signed)
Pt coming in today

## 2012-06-29 NOTE — Assessment & Plan Note (Signed)
New.  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  Cough meds prn.  Reviewed supportive care and red flags that should prompt return.  Pt expressed understanding and is in agreement w/ plan.

## 2012-12-07 ENCOUNTER — Other Ambulatory Visit: Payer: Self-pay | Admitting: Obstetrics and Gynecology

## 2012-12-07 DIAGNOSIS — Z1231 Encounter for screening mammogram for malignant neoplasm of breast: Secondary | ICD-10-CM

## 2012-12-14 ENCOUNTER — Ambulatory Visit (HOSPITAL_COMMUNITY)
Admission: RE | Admit: 2012-12-14 | Discharge: 2012-12-14 | Disposition: A | Discharge status: Home / Self Care | Payer: 59 | Source: Ambulatory Visit | Attending: Obstetrics and Gynecology | Admitting: Obstetrics and Gynecology

## 2012-12-14 DIAGNOSIS — Z1231 Encounter for screening mammogram for malignant neoplasm of breast: Secondary | ICD-10-CM | POA: Insufficient documentation

## 2013-01-04 ENCOUNTER — Other Ambulatory Visit: Payer: Self-pay | Admitting: Obstetrics and Gynecology

## 2013-01-04 DIAGNOSIS — R928 Other abnormal and inconclusive findings on diagnostic imaging of breast: Secondary | ICD-10-CM

## 2013-01-10 ENCOUNTER — Encounter: Payer: Self-pay | Admitting: Family Medicine

## 2013-01-10 ENCOUNTER — Ambulatory Visit (INDEPENDENT_AMBULATORY_CARE_PROVIDER_SITE_OTHER): Payer: 59 | Admitting: Family Medicine

## 2013-01-10 VITALS — BP 108/80 | HR 96 | Temp 98.5°F | Ht 62.25 in | Wt 202.8 lb

## 2013-01-10 DIAGNOSIS — J019 Acute sinusitis, unspecified: Secondary | ICD-10-CM

## 2013-01-10 MED ORDER — PROMETHAZINE-DM 6.25-15 MG/5ML PO SYRP: 5.0000 mL | ORAL_SOLUTION | Freq: Four times a day (QID) | ORAL | Status: DC | PRN

## 2013-01-10 MED ORDER — AMOXICILLIN 875 MG PO TABS: 875.0000 mg | ORAL_TABLET | Freq: Two times a day (BID) | ORAL | Status: DC

## 2013-01-10 NOTE — Patient Instructions (Addendum)
This is a sinus infection Start the Amox twice daily- take w/ food Drink plenty of fluids Add Mucinex to thin the congestion Use the cough syrup as needed- will cause drowsiness Call with any questions or concerns Have an AMAZING trip!

## 2013-01-10 NOTE — Assessment & Plan Note (Signed)
Pt's sxs and PE consistent w/ infxn.  Start abx.  Cough meds prn.  Reviewed supportive care and red flags that should prompt return.  Pt expressed understanding and is in agreement w/ plan.  

## 2013-01-10 NOTE — Progress Notes (Signed)
  Subjective:    Patient ID: Katelyn Reyes, female    DOB: [birth year], 52 y.o.   MRN: [id number]  HPI Cough- sxs started 1 week ago.  Cough is dry, hacking, not productive.  No fevers.  'i just felt bad'.  + head and nasal congestion.  + sinus pain/pressure.  Bilateral ear fullness.  No N/V/D.  + sick contacts at work.   Review of Systems For ROS see HPI     Objective:   Physical Exam  Vitals reviewed. Constitutional: She appears well-developed and well-nourished. No distress.  HENT:  Head: Normocephalic and atraumatic.  Right Ear: Tympanic membrane normal.  Left Ear: Tympanic membrane normal.  Nose: Mucosal edema and rhinorrhea present. Right sinus exhibits maxillary sinus tenderness and frontal sinus tenderness. Left sinus exhibits maxillary sinus tenderness and frontal sinus tenderness.  Mouth/Throat: Uvula is midline and mucous membranes are normal. Posterior oropharyngeal erythema present. No oropharyngeal exudate.  Eyes: Conjunctivae and EOM are normal. Pupils are equal, round, and reactive to light.  Neck: Normal range of motion. Neck supple.  Cardiovascular: Normal rate, regular rhythm and normal heart sounds.   Pulmonary/Chest: Effort normal and breath sounds normal. No respiratory distress. She has no wheezes.  Lymphadenopathy:    She has no cervical adenopathy.          Assessment & Plan:

## 2013-01-24 ENCOUNTER — Other Ambulatory Visit: Payer: Self-pay | Admitting: Obstetrics and Gynecology

## 2013-01-24 ENCOUNTER — Ambulatory Visit
Admission: RE | Admit: 2013-01-24 | Discharge: 2013-01-24 | Disposition: A | Discharge status: Home / Self Care | Payer: 59 | Source: Ambulatory Visit | Attending: Obstetrics and Gynecology | Admitting: Obstetrics and Gynecology

## 2013-01-24 ENCOUNTER — Telehealth: Payer: Self-pay | Admitting: Family Medicine

## 2013-01-24 DIAGNOSIS — R928 Other abnormal and inconclusive findings on diagnostic imaging of breast: Secondary | ICD-10-CM

## 2013-01-24 MED ORDER — FLUCONAZOLE 150 MG PO TABS: 150.0000 mg | ORAL_TABLET | Freq: Once | ORAL | Status: DC

## 2013-01-24 NOTE — Telephone Encounter (Signed)
Ok for Diflucan 150mg x1. 

## 2013-01-24 NOTE — Telephone Encounter (Signed)
Patient came in on 01/10/13 and received Amoxicillan which has now caused her to have a yeast infection. Patient is asking that a prescription for Diflucan be sent to [facility] pharmacy for this. Please advise.

## 2013-01-24 NOTE — Telephone Encounter (Signed)
Sent to pharmacy. Patient advised.  

## 2013-02-02 ENCOUNTER — Encounter (INDEPENDENT_AMBULATORY_CARE_PROVIDER_SITE_OTHER): Payer: Self-pay | Admitting: Surgery

## 2013-02-02 ENCOUNTER — Ambulatory Visit (INDEPENDENT_AMBULATORY_CARE_PROVIDER_SITE_OTHER): Payer: Commercial Managed Care - PPO | Admitting: Surgery

## 2013-02-02 VITALS — BP 118/70 | HR 90 | Ht 63.0 in | Wt 201.8 lb

## 2013-02-02 DIAGNOSIS — R921 Mammographic calcification found on diagnostic imaging of breast: Secondary | ICD-10-CM

## 2013-02-02 DIAGNOSIS — R928 Other abnormal and inconclusive findings on diagnostic imaging of breast: Secondary | ICD-10-CM

## 2013-02-02 HISTORY — DX: Mammographic calcification found on diagnostic imaging of breast: R92.1

## 2013-02-02 NOTE — Patient Instructions (Signed)
We will schedule surgery to remove the calcifications from your right breast.

## 2013-02-02 NOTE — Progress Notes (Signed)
NAME: Katelyn Reyes DOB: 05/28/1961 MRN: [id number]                                                                                      DATE: 02/02/2013  PCP: Katelyn Tabori, MD Referring Provider: No ref. provider found  IMPRESSION:  Right breast calcifications, indeterminate  PLAN:   Surgical excision, wire localized. She is adamant she does not want NCB althugh I told her that is the preferred way to evaluate this. I have discussed the indications for the lumpectomy and described the procedure. She understand that the chance of removal of the abnormal area is very good, but that occasionally we are unable to locate it and may have to do a second procedure. We also discussed the possibility of a second procedure to get additional tissue. Risks of surgery such as bleeding and infection have also been explained, as well as the implications of not doing the surgery. She understands and wishes to proceed.                  CC:  Chief Complaint  Patient presents with  . Breast Problem    eval br calci/no bx done     HPI:  Katelyn Reyes is a 51 y.o.  female who presents for evaluation of right breast cal;cifications just found on mamogram, not biopsied yet. She declined NCB. No prior history of breast cancer and no family history of breast cnacer  PMH:  has a past medical history of Allergic rhinitis; Depression; Hyperlipidemia; and IUD (02/07/10).  PSH:   has past surgical history that includes Mouth surgery.  ALLERGIES:   Allergies  Allergen Reactions  . Erythromycin   . Hydrocodone-Acetaminophen     REACTION: hallucinations    MEDICATIONS: Current outpatient prescriptions:albuterol (PROVENTIL HFA;VENTOLIN HFA) 108 (90 BASE) MCG/ACT inhaler, Inhale 2 puffs into the lungs every 6 (six) hours as needed for wheezing., Disp: 1 Inhaler, Rfl: 6;  amoxicillin (AMOXIL) 875 MG tablet, Take 1 tablet (875 mg total) by mouth 2 (two) times daily., Disp: 20 tablet, Rfl: 0;  cetirizine  (ZYRTEC) 10 MG tablet, Take 1 tablet (10 mg total) by mouth daily., Disp: 90 tablet, Rfl: 3 fluconazole (DIFLUCAN) 150 MG tablet, Take 1 tablet (150 mg total) by mouth once., Disp: 1 tablet, Rfl: 0;  levonorgestrel (MIRENA) 20 MCG/24HR IUD, 1 each by Intrauterine route once.  , Disp: , Rfl: ;  mometasone (NASONEX) 50 MCG/ACT nasal spray, Place 2 sprays into the nose daily., Disp: 17 g, Rfl: 6;  montelukast (SINGULAIR) 10 MG tablet, Take 1 tablet (10 mg total) by mouth daily., Disp: 90 tablet, Rfl: 3 promethazine-dextromethorphan (PROMETHAZINE-DM) 6.25-15 MG/5ML syrup, Take 5 mLs by mouth 4 (four) times daily as needed., Disp: 240 mL, Rfl: 0;  sertraline (ZOLOFT) 100 MG tablet, Take 1 tablet (100 mg total) by mouth daily., Disp: 90 tablet, Rfl: 3;  zolpidem (AMBIEN) 10 MG tablet, Take 10 mg by mouth at bedtime as needed., Disp: , Rfl:   ROS: She has filled out our 12 point review of systems and it is negative . EXAM:   VITAL SIGNS:  BP 118/70  Pulse 90    Ht 5' 3" (1.6 m)  Wt 201 lb 12.8 oz (91.536 kg)  BMI 35.76 kg/m2  GENERAL:  The patient is alert, oriented, and generally healthy-appearing, NAD. Mood and affect are normal.  HEENT:  The head is normocephalic, the eyes nonicteric, the pupils were round regular and equal. EOMs are normal. Pharynx normal. Dentition good.  NECK:  The neck is supple and there are no masses or thyromegaly.  LUNGS: Normal respirations and clear to auscultation.  HEART: Regular rhythm, with no murmurs rubs or gallops. Pulses are intact carotid dorsalis pedis and posterior tibial. No significant varicosities are noted.  BREASTS:  Symmetric. No mass, tenderness or other abnormality  ABDOMEN: Soft, flat, and nontender. No masses or organomegaly is noted. No hernias are noted. Bowel sounds are normal.  EXTREMITIES:  Good range of motion, no edema.   DATA REVIEWED:  Mammogram reviewed    Katelyn Reyes 02/02/2013  CC: No ref. provider found,  Katelyn Tabori, MD        

## 2013-02-07 ENCOUNTER — Encounter (INDEPENDENT_AMBULATORY_CARE_PROVIDER_SITE_OTHER): Payer: Self-pay

## 2013-02-15 ENCOUNTER — Encounter (HOSPITAL_BASED_OUTPATIENT_CLINIC_OR_DEPARTMENT_OTHER): Payer: Self-pay | Admitting: *Deleted

## 2013-02-17 ENCOUNTER — Encounter (HOSPITAL_BASED_OUTPATIENT_CLINIC_OR_DEPARTMENT_OTHER)
Admission: RE | Disposition: A | Discharge status: Home / Self Care | Payer: Self-pay | Source: Ambulatory Visit | Attending: Surgery

## 2013-02-17 ENCOUNTER — Encounter (HOSPITAL_BASED_OUTPATIENT_CLINIC_OR_DEPARTMENT_OTHER): Payer: Self-pay | Admitting: *Deleted

## 2013-02-17 ENCOUNTER — Ambulatory Visit (HOSPITAL_BASED_OUTPATIENT_CLINIC_OR_DEPARTMENT_OTHER)
Admission: RE | Admit: 2013-02-17 | Discharge: 2013-02-17 | Disposition: A | Discharge status: Home / Self Care | Payer: 59 | Source: Ambulatory Visit | Attending: Surgery | Admitting: Surgery

## 2013-02-17 ENCOUNTER — Ambulatory Visit (HOSPITAL_BASED_OUTPATIENT_CLINIC_OR_DEPARTMENT_OTHER): Payer: 59 | Admitting: *Deleted

## 2013-02-17 ENCOUNTER — Ambulatory Visit
Admission: RE | Admit: 2013-02-17 | Discharge: 2013-02-17 | Disposition: A | Discharge status: Home / Self Care | Payer: 59 | Source: Ambulatory Visit | Attending: Surgery | Admitting: Surgery

## 2013-02-17 DIAGNOSIS — N6019 Diffuse cystic mastopathy of unspecified breast: Secondary | ICD-10-CM | POA: Insufficient documentation

## 2013-02-17 DIAGNOSIS — Z975 Presence of (intrauterine) contraceptive device: Secondary | ICD-10-CM | POA: Insufficient documentation

## 2013-02-17 DIAGNOSIS — Z881 Allergy status to other antibiotic agents status: Secondary | ICD-10-CM | POA: Insufficient documentation

## 2013-02-17 DIAGNOSIS — F329 Major depressive disorder, single episode, unspecified: Secondary | ICD-10-CM | POA: Insufficient documentation

## 2013-02-17 DIAGNOSIS — R920 Mammographic microcalcification found on diagnostic imaging of breast: Secondary | ICD-10-CM

## 2013-02-17 DIAGNOSIS — J45909 Unspecified asthma, uncomplicated: Secondary | ICD-10-CM | POA: Insufficient documentation

## 2013-02-17 DIAGNOSIS — R921 Mammographic calcification found on diagnostic imaging of breast: Secondary | ICD-10-CM

## 2013-02-17 DIAGNOSIS — F3289 Other specified depressive episodes: Secondary | ICD-10-CM | POA: Insufficient documentation

## 2013-02-17 DIAGNOSIS — Z79899 Other long term (current) drug therapy: Secondary | ICD-10-CM | POA: Insufficient documentation

## 2013-02-17 DIAGNOSIS — N62 Hypertrophy of breast: Secondary | ICD-10-CM | POA: Insufficient documentation

## 2013-02-17 DIAGNOSIS — E785 Hyperlipidemia, unspecified: Secondary | ICD-10-CM | POA: Insufficient documentation

## 2013-02-17 DIAGNOSIS — Z885 Allergy status to narcotic agent status: Secondary | ICD-10-CM | POA: Insufficient documentation

## 2013-02-17 HISTORY — DX: Unspecified asthma, uncomplicated: J45.909

## 2013-02-17 HISTORY — PX: BREAST BIOPSY: SHX20

## 2013-02-17 LAB — POCT HEMOGLOBIN-HEMACUE: Hemoglobin: 13.8 g/dL (ref 12.0–15.0)

## 2013-02-17 SURGERY — BREAST BIOPSY WITH NEEDLE LOCALIZATION
Anesthesia: General | Site: Breast | Laterality: Right | Wound class: Clean

## 2013-02-17 MED ORDER — PROPOFOL 10 MG/ML IV BOLUS
INTRAVENOUS | Status: DC | PRN
  Administered 2013-02-17: 200 mg via INTRAVENOUS

## 2013-02-17 MED ORDER — BUPIVACAINE HCL (PF) 0.25 % IJ SOLN
INTRAMUSCULAR | Status: DC | PRN
  Administered 2013-02-17: 20 mL

## 2013-02-17 MED ORDER — CHLORHEXIDINE GLUCONATE 4 % EX LIQD: 1.0000 "application " | Freq: Once | CUTANEOUS | Status: DC

## 2013-02-17 MED ORDER — LACTATED RINGERS IV SOLN
INTRAVENOUS | Status: DC
  Administered 2013-02-17 (×2): via INTRAVENOUS

## 2013-02-17 MED ORDER — OXYCODONE HCL 5 MG/5ML PO SOLN: 5.0000 mg | Freq: Once | ORAL | Status: AC | PRN

## 2013-02-17 MED ORDER — FENTANYL CITRATE 0.05 MG/ML IJ SOLN: 50.0000 ug | INTRAMUSCULAR | Status: DC | PRN

## 2013-02-17 MED ORDER — OXYCODONE HCL 5 MG PO TABS
5.0000 mg | ORAL_TABLET | Freq: Once | ORAL | Status: AC | PRN
  Administered 2013-02-17: 5 mg via ORAL

## 2013-02-17 MED ORDER — DEXAMETHASONE SODIUM PHOSPHATE 4 MG/ML IJ SOLN
INTRAMUSCULAR | Status: DC | PRN
  Administered 2013-02-17: 10 mg via INTRAVENOUS

## 2013-02-17 MED ORDER — ONDANSETRON HCL 4 MG/2ML IJ SOLN
INTRAMUSCULAR | Status: DC | PRN
  Administered 2013-02-17: 4 mg via INTRAVENOUS

## 2013-02-17 MED ORDER — MIDAZOLAM HCL 2 MG/2ML IJ SOLN: 1.0000 mg | INTRAMUSCULAR | Status: DC | PRN

## 2013-02-17 MED ORDER — TRAMADOL HCL 50 MG PO TABS
50.0000 mg | ORAL_TABLET | Freq: Four times a day (QID) | ORAL | Status: DC | PRN
Start: 2013-02-17 — End: 2013-03-11

## 2013-02-17 MED ORDER — FENTANYL CITRATE 0.05 MG/ML IJ SOLN
INTRAMUSCULAR | Status: DC | PRN
  Administered 2013-02-17: 100 ug via INTRAVENOUS

## 2013-02-17 MED ORDER — HYDROMORPHONE HCL PF 1 MG/ML IJ SOLN
0.2500 mg | INTRAMUSCULAR | Status: DC | PRN
  Administered 2013-02-17: 0.5 mg via INTRAVENOUS

## 2013-02-17 MED ORDER — LIDOCAINE HCL (CARDIAC) 20 MG/ML IV SOLN
INTRAVENOUS | Status: DC | PRN
  Administered 2013-02-17: 60 mg via INTRAVENOUS

## 2013-02-17 MED ORDER — CEFAZOLIN SODIUM-DEXTROSE 2-3 GM-% IV SOLR
2.0000 g | INTRAVENOUS | Status: AC
  Administered 2013-02-17: 2 g via INTRAVENOUS

## 2013-02-17 MED ORDER — MIDAZOLAM HCL 5 MG/5ML IJ SOLN
INTRAMUSCULAR | Status: DC | PRN
  Administered 2013-02-17: 2 mg via INTRAVENOUS

## 2013-02-17 SURGICAL SUPPLY — 48 items
APPLICATOR COTTON TIP 6IN STRL (MISCELLANEOUS) IMPLANT
BINDER BREAST LRG (GAUZE/BANDAGES/DRESSINGS) IMPLANT
BINDER BREAST MEDIUM (GAUZE/BANDAGES/DRESSINGS) IMPLANT
BINDER BREAST XLRG (GAUZE/BANDAGES/DRESSINGS) IMPLANT
BINDER BREAST XXLRG (GAUZE/BANDAGES/DRESSINGS) IMPLANT
BLADE HEX COATED 2.75 (ELECTRODE) ×2 IMPLANT
BLADE SURG 15 STRL LF DISP TIS (BLADE) ×1 IMPLANT
BLADE SURG 15 STRL SS (BLADE) ×1
CANISTER SUCTION 1200CC (MISCELLANEOUS) IMPLANT
CHLORAPREP W/TINT 26ML (MISCELLANEOUS) ×2 IMPLANT
CLIP TI MEDIUM 6 (CLIP) IMPLANT
CLIP TI WIDE RED SMALL 6 (CLIP) IMPLANT
CLOTH BEACON ORANGE TIMEOUT ST (SAFETY) ×2 IMPLANT
COVER MAYO STAND STRL (DRAPES) ×2 IMPLANT
COVER TABLE BACK 60X90 (DRAPES) ×2 IMPLANT
DECANTER SPIKE VIAL GLASS SM (MISCELLANEOUS) IMPLANT
DERMABOND ADVANCED (GAUZE/BANDAGES/DRESSINGS) ×1
DERMABOND ADVANCED .7 DNX12 (GAUZE/BANDAGES/DRESSINGS) ×1 IMPLANT
DEVICE DUBIN W/COMP PLATE 8390 (MISCELLANEOUS) ×2 IMPLANT
DRAPE LAPAROTOMY TRNSV 102X78 (DRAPE) ×2 IMPLANT
DRAPE SURG 17X23 STRL (DRAPES) IMPLANT
DRAPE UTILITY XL STRL (DRAPES) ×2 IMPLANT
ELECT REM PT RETURN 9FT ADLT (ELECTROSURGICAL) ×2
ELECTRODE REM PT RTRN 9FT ADLT (ELECTROSURGICAL) ×1 IMPLANT
GLOVE BIOGEL M 7.0 STRL (GLOVE) ×2 IMPLANT
GLOVE BIOGEL PI IND STRL 7.5 (GLOVE) ×1 IMPLANT
GLOVE BIOGEL PI INDICATOR 7.5 (GLOVE) ×1
GLOVE EUDERMIC 7 POWDERFREE (GLOVE) ×2 IMPLANT
GLOVE EXAM NITRILE LRG STRL (GLOVE) ×2 IMPLANT
GOWN PREVENTION PLUS XLARGE (GOWN DISPOSABLE) ×4 IMPLANT
KIT MARKER MARGIN INK (KITS) ×2 IMPLANT
NEEDLE HYPO 25X1 1.5 SAFETY (NEEDLE) ×2 IMPLANT
NS IRRIG 1000ML POUR BTL (IV SOLUTION) ×2 IMPLANT
PACK BASIN DAY SURGERY FS (CUSTOM PROCEDURE TRAY) ×2 IMPLANT
PENCIL BUTTON HOLSTER BLD 10FT (ELECTRODE) ×2 IMPLANT
SHEET MEDIUM DRAPE 40X70 STRL (DRAPES) ×2 IMPLANT
SLEEVE SCD COMPRESS KNEE MED (MISCELLANEOUS) ×2 IMPLANT
SPONGE GAUZE 4X4 12PLY (GAUZE/BANDAGES/DRESSINGS) IMPLANT
SPONGE INTESTINAL PEANUT (DISPOSABLE) IMPLANT
SPONGE LAP 4X18 X RAY DECT (DISPOSABLE) ×2 IMPLANT
STAPLER VISISTAT 35W (STAPLE) IMPLANT
SUT MNCRL AB 4-0 PS2 18 (SUTURE) ×2 IMPLANT
SUT SILK 0 TIES 10X30 (SUTURE) IMPLANT
SUT VICRYL 3-0 CR8 SH (SUTURE) ×2 IMPLANT
SYR CONTROL 10ML LL (SYRINGE) ×2 IMPLANT
TOWEL OR NON WOVEN STRL DISP B (DISPOSABLE) ×2 IMPLANT
TUBE CONNECTING 20X1/4 (TUBING) ×2 IMPLANT
YANKAUER SUCT BULB TIP NO VENT (SUCTIONS) ×2 IMPLANT

## 2013-02-17 NOTE — Op Note (Signed)
Katelyn Reyes 1960/11/03 [id number] 02/03/2013  Preoperative diagnosis: right breast calcifications, upper inner quadrant, indeterminate etiology  Postoperative diagnosis: same  Procedure: wire localized excisional biopsy of right breast calcifications  Surgeon: Currie Paris, MD, FACS  Anesthesia: GA combined with regional for post-op pain   Clinical History and Indications: this patient had a mammogram recently showing some indeterminate calcifications in the right upper inner quadrant of the breast. She declined needle core biopsy and insisted on excisional biopsy. She comes to the operating room today for that surgery procedure.    Description of Procedure: I saw the patient in the preoperative area, confirmed the plans with her, marked in the right breast the operative site, and reviewed the wire localized films.  The patient was taken to the operating room and after satisfactory general anesthesia was obtained the right breast was prepped and draped at a time out was performed. The guidewire entered superiorly in the breast in the medial aspect of tract inferior and somewhat deep. The calcifications appear to be about 3 cm from the guidewire entry site.  And a curvilinear incision below the guidewire entry site and elevated a superior skin flap. In doing so the cautery divided the guidewire so the external portion was removed. I then grasped breast tissue on each side of the guidewire and using cautery excised it going beyond the tip of the guidewire. The specimen was then marked with various colors of ink for pathological orientation. In doing so the remainder of the guidewire pulled through and out of the specimen. The specimen mammogram showed the calcifications to be in the approximate center of the specimen. This is confirmed by the radiologist.  I then infiltrated 20 cc of 0.25% plain Marcaine to help with postop pain relief. I irrigated and made sure everything was dry. I  closed in layers with 3-0 Vicryl, 4-0 Monocryl subcuticular, and Dermabond.  The patient tolerated the procedure well. There were no complications. Counts were correct. Blood loss was minimal.  Currie Paris, MD, FACS 02/17/2013 10:02 AM

## 2013-02-17 NOTE — Anesthesia Postprocedure Evaluation (Signed)
  Anesthesia Post-op Note  Patient: Katelyn Reyes  Procedure(s) Performed: Procedure(s):  NEEDLE LOCALIZATION REMOVAL RIGHT BREAST CALCIFICATIONS (Right)  Patient Location: PACU  Anesthesia Type:General  Level of Consciousness: awake, alert  and oriented  Airway and Oxygen Therapy: Patient Spontanous Breathing  Post-op Pain: moderate  Post-op Assessment: Post-op Vital signs reviewed, Patient's Cardiovascular Status Stable, Respiratory Function Stable, Patent Airway and No signs of Nausea or vomiting  Post-op Vital Signs: Reviewed and stable  Complications: No apparent anesthesia complications

## 2013-02-17 NOTE — Transfer of Care (Signed)
Immediate Anesthesia Transfer of Care Note  Patient: Katelyn Reyes  Procedure(s) Performed: Procedure(s):  NEEDLE LOCALIZATION REMOVAL RIGHT BREAST CALCIFICATIONS (Right)  Patient Location: PACU  Anesthesia Type:General  Level of Consciousness: awake, alert  and oriented  Airway & Oxygen Therapy: Patient Spontanous Breathing and Patient connected to face mask oxygen  Post-op Assessment: Report given to PACU RN, Post -op Vital signs reviewed and stable and Patient moving all extremities  Post vital signs: Reviewed and stable  Complications: No apparent anesthesia complications

## 2013-02-17 NOTE — Discharge Instructions (Addendum)
[facility]___[facility] [place] [phone]   BREAST BIOPSY/ PARTIAL MASTECTOMY: POST OP INSTRUCTIONS  Always review your discharge instruction sheet given to you by the facility where your surgery was performed.  IF YOU HAVE DISABILITY OR FAMILY LEAVE FORMS, YOU MUST BRING THEM TO THE OFFICE FOR PROCESSING.  DO NOT GIVE THEM TO YOUR DOCTOR.  1. A prescription for pain medication will be given to you upon discharge.  Take your pain medication as prescribed, as needed.  If narcotic pain medicine is not needed, then you may take ibuprofen (Advil) as needed. 2. Take your usually prescribed medications unless otherwise directed 3. If you need a refill on your pain medication, please contact your pharmacy.  They will contact our office to request authorization.  Prescriptions will not be filled after 5pm or on week-ends. 4. You should eat very light the first 24 hours after surgery, such as soup, crackers, pudding, etc.  Resume your normal diet the day after surgery. 5. Most patients will experience some swelling and bruising in the breast.  Ice packs and a good support bra will help.  Swelling and bruising can take several days to resolve.  6. It is common to experience some constipation if taking pain medication after surgery.  Increasing fluid intake and taking a stool softener will usually help or prevent this problem from occurring.  A mild laxative (Milk of Magnesia or Miralax) should be taken according to package directions if there are no bowel movements after 48 hours. 7. Unless discharge instructions indicate otherwise, you may remove your bandages 24 hours after surgery, and you may shower at that time.  If your surgeon used skin glue on the incision, you may shower in 24 hours.  The glue will flake off over the next 2-3 weeks. 8. DRAINS:  If you have drain, it is important to keep a list of the amount of drainage produced each day in your drains.  Before leaving the hospital, you should  be instructed on drain care.  Call our office if you have any questions about your drains. BE SURE TO BRING THE RECORD OF THE AMOUNT OF DRAINAGE TO YOUR OFFICE VISITS. We use this to determine when the drains can be removed. 9. ACTIVITIES:  You may resume regular daily activities (gradually increasing) beginning the next day.  Wearing a good support bra or sports bra minimizes pain and swelling.  You may have sexual intercourse when it is comfortable. a. You may drive when you no longer are taking prescription pain medication, you can comfortably wear a seatbelt, and you can safely maneuver your car and apply brakes. b. RETURN TO WORK:  ______________________________________________________________________________________ 10. You should see your doctor in the office for a follow-up appointment approximately two weeks after your surgery.  Your doctors nurse will typically make your follow-up appointment when she calls you with your pathology report.  Expect your pathology report 2-3 business days after your surgery.  You may call to check if you do not hear from [place] after three days. 11. OTHER INSTRUCTIONS: _______________________________________________________________________________________________ _____________________________________________________________________________________________________________________________________ _____________________________________________________________________________________________________________________________________ _____________________________________________________________________________________________________________________________________  WHEN TO CALL YOUR DOCTOR: 1. Fever over 101.0 2. Nausea and/or vomiting. 3. Extreme swelling or bruising. 4. Continued bleeding from incision. 5. Increased pain, redness, or drainage from the incision.  The clinic staff is available to answer your questions during regular business hours.  Please dont  hesitate to call and ask to speak to one of the nurses for clinical concerns.  If you have a medical emergency, go  to the nearest emergency room or call 911.  A surgeon from [facility] is always on call at the hospital.  [address], [address], [place], [place]  [address] ?  [address] [address], [place], [place]   [address]                           [phone] ? [phone] ? FAX [phone] Web site: [email or web address]    Post Anesthesia Home Care Instructions  Activity: Get plenty of rest for the remainder of the day. A responsible adult should stay with you for 24 hours following the procedure.  For the next 24 hours, DO NOT: -Drive a car -Advertising copywriter -Drink alcoholic beverages -Take any medication unless instructed by your physician -Make any legal decisions or sign important papers.  Meals: Start with liquid foods such as gelatin or soup. Progress to regular foods as tolerated. Avoid greasy, spicy, heavy foods. If nausea and/or vomiting occur, drink only clear liquids until the nausea and/or vomiting subsides. Call your physician if vomiting continues.  Special Instructions/Symptoms: Your throat may feel dry or sore from the anesthesia or the breathing tube placed in your throat during surgery. If this causes discomfort, gargle with warm salt water. The discomfort should disappear within 24 hours.

## 2013-02-17 NOTE — Interval H&P Note (Signed)
IHistory and Physical Interval Note:  02/17/2013 9:04 AM  Katelyn Reyes  has presented today for surgery, with the diagnosis of right breast calcifications  The various methods of treatment have been discussed with the patient and family. After consideration of risks, benefits and other options for treatment, the patient has consented to  Procedure(s):  NEEDLE LOCALIZATION REMOVAL RIGHT BREAST CALCIFICATIONS (Right) as a surgical intervention .  The patient's history has been reviewed, patient examined, no change in status, stable for surgery.  I have reviewed the patient's chart and labs.  Questions were answered to the patient's satisfaction.    I have marked the right breast as the operative side and reviewed the wire loc films   Archie Shea J

## 2013-02-17 NOTE — Anesthesia Procedure Notes (Signed)
Procedure Name: LMA Insertion Date/Time: 02/17/2013 9:15 AM Performed by: Meyer Russel Pre-anesthesia Checklist: Patient identified, Emergency Drugs available, Suction available and Patient being monitored Patient Re-evaluated:Patient Re-evaluated prior to inductionOxygen Delivery Method: Circle System Utilized Preoxygenation: Pre-oxygenation with 100% oxygen Intubation Type: IV induction Ventilation: Mask ventilation without difficulty LMA: LMA inserted LMA Size: 4.0 Number of attempts: 1 Airway Equipment and Method: bite block Placement Confirmation: positive ETCO2 and breath sounds checked- equal and bilateral Tube secured with: Tape Dental Injury: Teeth and Oropharynx as per pre-operative assessment

## 2013-02-17 NOTE — H&P (View-Only) (Signed)
NAME: Katelyn Reyes DOB: 12/19/1960 MRN: [id number]                                                                                      DATE: 02/02/2013  PCP: Neena Rhymes, MD Referring Provider: No ref. provider found  IMPRESSION:  Right breast calcifications, indeterminate  PLAN:   Surgical excision, wire localized. She is adamant she does not want NCB althugh I told her that is the preferred way to evaluate this. I have discussed the indications for the lumpectomy and described the procedure. She understand that the chance of removal of the abnormal area is very good, but that occasionally we are unable to locate it and may have to do a second procedure. We also discussed the possibility of a second procedure to get additional tissue. Risks of surgery such as bleeding and infection have also been explained, as well as the implications of not doing the surgery. She understands and wishes to proceed.                  CC:  Chief Complaint  Patient presents with  . Breast Problem    eval br calci/no bx done     HPI:  Katelyn Reyes is a 52 y.o.  female who presents for evaluation of right breast cal;cifications just found on mamogram, not biopsied yet. She declined NCB. No prior history of breast cancer and no family history of breast cnacer  PMH:  has a past medical history of Allergic rhinitis; Depression; Hyperlipidemia; and IUD (02/07/10).  PSH:   has past surgical history that includes Mouth surgery.  ALLERGIES:   Allergies  Allergen Reactions  . Erythromycin   . Hydrocodone-Acetaminophen     REACTION: hallucinations    MEDICATIONS: Current outpatient prescriptions:albuterol (PROVENTIL HFA;VENTOLIN HFA) 108 (90 BASE) MCG/ACT inhaler, Inhale 2 puffs into the lungs every 6 (six) hours as needed for wheezing., Disp: 1 Inhaler, Rfl: 6;  amoxicillin (AMOXIL) 875 MG tablet, Take 1 tablet (875 mg total) by mouth 2 (two) times daily., Disp: 20 tablet, Rfl: 0;  cetirizine  (ZYRTEC) 10 MG tablet, Take 1 tablet (10 mg total) by mouth daily., Disp: 90 tablet, Rfl: 3 fluconazole (DIFLUCAN) 150 MG tablet, Take 1 tablet (150 mg total) by mouth once., Disp: 1 tablet, Rfl: 0;  levonorgestrel (MIRENA) 20 MCG/24HR IUD, 1 each by Intrauterine route once.  , Disp: , Rfl: ;  mometasone (NASONEX) 50 MCG/ACT nasal spray, Place 2 sprays into the nose daily., Disp: 17 g, Rfl: 6;  montelukast (SINGULAIR) 10 MG tablet, Take 1 tablet (10 mg total) by mouth daily., Disp: 90 tablet, Rfl: 3 promethazine-dextromethorphan (PROMETHAZINE-DM) 6.25-15 MG/5ML syrup, Take 5 mLs by mouth 4 (four) times daily as needed., Disp: 240 mL, Rfl: 0;  sertraline (ZOLOFT) 100 MG tablet, Take 1 tablet (100 mg total) by mouth daily., Disp: 90 tablet, Rfl: 3;  zolpidem (AMBIEN) 10 MG tablet, Take 10 mg by mouth at bedtime as needed., Disp: , Rfl:   ROS: She has filled out our 12 point review of systems and it is negative . EXAM:   VITAL SIGNS:  BP 118/70  Pulse 90  Ht 5\' 3"  (1.6 m)  Wt 201 lb 12.8 oz (91.536 kg)  BMI 35.76 kg/m2  GENERAL:  The patient is alert, oriented, and generally healthy-appearing, NAD. Mood and affect are normal.  HEENT:  The head is normocephalic, the eyes nonicteric, the pupils were round regular and equal. EOMs are normal. Pharynx normal. Dentition good.  NECK:  The neck is supple and there are no masses or thyromegaly.  LUNGS: Normal respirations and clear to auscultation.  HEART: Regular rhythm, with no murmurs rubs or gallops. Pulses are intact carotid dorsalis pedis and posterior tibial. No significant varicosities are noted.  BREASTS:  Symmetric. No mass, tenderness or other abnormality  ABDOMEN: Soft, flat, and nontender. No masses or organomegaly is noted. No hernias are noted. Bowel sounds are normal.  EXTREMITIES:  Good range of motion, no edema.   DATA REVIEWED:  Mammogram reviewed    Kollyn Lingafelter J 02/02/2013  CC: No ref. provider found,  Neena Rhymes, MD

## 2013-02-17 NOTE — Anesthesia Preprocedure Evaluation (Signed)
Anesthesia Evaluation  Patient identified by MRN, date of birth, ID band Patient awake    Reviewed: Allergy & Precautions, H&P , NPO status , Patient's Chart, lab work & pertinent test results  Airway Mallampati: II TM Distance: >3 FB Neck ROM: Full    Dental no notable dental hx. (+) Teeth Intact and Dental Advisory Given   Pulmonary neg pulmonary ROS, asthma ,  breath sounds clear to auscultation  Pulmonary exam normal       Cardiovascular negative cardio ROS  Rhythm:Regular Rate:Normal     Neuro/Psych PSYCHIATRIC DISORDERS negative neurological ROS     GI/Hepatic negative GI ROS, Neg liver ROS,   Endo/Other  negative endocrine ROS  Renal/GU negative Renal ROS  negative genitourinary   Musculoskeletal   Abdominal   Peds  Hematology negative hematology ROS (+)   Anesthesia Other Findings   Reproductive/Obstetrics negative OB ROS                           Anesthesia Physical Anesthesia Plan  ASA: II  Anesthesia Plan: General   Post-op Pain Management:    Induction: Intravenous  Airway Management Planned: LMA  Additional Equipment:   Intra-op Plan:   Post-operative Plan: Extubation in OR  Informed Consent: I have reviewed the patients History and Physical, chart, labs and discussed the procedure including the risks, benefits and alternatives for the proposed anesthesia with the patient or authorized representative who has indicated his/her understanding and acceptance.   Dental advisory given  Plan Discussed with: CRNA  Anesthesia Plan Comments:         Anesthesia Quick Evaluation

## 2013-02-18 NOTE — Progress Notes (Signed)
Quick Note:  Tell her path is benign and as expected ______ 

## 2013-02-21 ENCOUNTER — Encounter (HOSPITAL_BASED_OUTPATIENT_CLINIC_OR_DEPARTMENT_OTHER): Payer: Self-pay | Admitting: Surgery

## 2013-02-21 ENCOUNTER — Telehealth (INDEPENDENT_AMBULATORY_CARE_PROVIDER_SITE_OTHER): Payer: Self-pay

## 2013-02-21 NOTE — Telephone Encounter (Signed)
Spoke with pt giving her her pathology results -  benign and as expected

## 2013-03-11 ENCOUNTER — Ambulatory Visit (INDEPENDENT_AMBULATORY_CARE_PROVIDER_SITE_OTHER): Payer: Commercial Managed Care - PPO | Admitting: Surgery

## 2013-03-11 ENCOUNTER — Encounter (INDEPENDENT_AMBULATORY_CARE_PROVIDER_SITE_OTHER): Payer: Self-pay | Admitting: Surgery

## 2013-03-11 VITALS — BP 122/76 | HR 80 | Resp 16 | Ht 63.0 in | Wt 203.4 lb

## 2013-03-11 DIAGNOSIS — R921 Mammographic calcification found on diagnostic imaging of breast: Secondary | ICD-10-CM

## 2013-03-11 DIAGNOSIS — R928 Other abnormal and inconclusive findings on diagnostic imaging of breast: Secondary | ICD-10-CM

## 2013-03-11 NOTE — Progress Notes (Signed)
.   NAME: Katelyn Reyes                                            DOB: Jun 11, 1961 DATE: 03/11/2013                                                  MRN: [id number]  CC:  Chief Complaint  Patient presents with  . Routine Post Op    1st po breast bx    HPI: This patient comes in for post op follow-up .Sheunderwent wire localized right breast biopsy on 02/17/13. She feels that she is doing well.  PE:  VITAL SIGNS: BP 122/76  Pulse 80  Resp 16  Ht 5\' 3"  (1.6 m)  Wt 203 lb 6.4 oz (92.262 kg)  BMI 36.04 kg/m2  General: The patient appears to be healthy, NAD Incision is clean, but feels like some fluid > aspirated 20 cc blood stained seroma fluid  DATA REVIEWED: PATHOLOGY: Diagnosis Breast, lumpectomy, Right - BENIGN BREAST TISSUE, SEE COMMENT. - NEGATIVE FOR ATYPIA OR MALIGNANC  IMPRESSION: The patient is doing well S/P Right breast surgical bx. Paost op seroma.    PLAN: I gave the patient a copy of the pathology report and reviewed it with her She will come back for a recheck in two or three weeks

## 2013-03-11 NOTE — Patient Instructions (Signed)
See me again in about three weeks

## 2013-03-21 ENCOUNTER — Telehealth: Payer: Self-pay

## 2013-03-21 NOTE — Telephone Encounter (Signed)
LM for CB  HM reviewed. Due as noted: No immunizations on file WE Hep A

## 2013-03-22 ENCOUNTER — Other Ambulatory Visit: Payer: Self-pay | Admitting: General Practice

## 2013-03-22 ENCOUNTER — Encounter: Payer: Self-pay | Admitting: Family Medicine

## 2013-03-22 ENCOUNTER — Ambulatory Visit (INDEPENDENT_AMBULATORY_CARE_PROVIDER_SITE_OTHER): Payer: 59 | Admitting: Family Medicine

## 2013-03-22 VITALS — BP 120/76 | HR 77 | Temp 98.1°F | Resp 16 | Ht 62.0 in | Wt 199.1 lb

## 2013-03-22 DIAGNOSIS — J45909 Unspecified asthma, uncomplicated: Secondary | ICD-10-CM

## 2013-03-22 DIAGNOSIS — Z23 Encounter for immunization: Secondary | ICD-10-CM

## 2013-03-22 DIAGNOSIS — Z0000 Encounter for general adult medical examination without abnormal findings: Secondary | ICD-10-CM

## 2013-03-22 DIAGNOSIS — J4520 Mild intermittent asthma, uncomplicated: Secondary | ICD-10-CM | POA: Insufficient documentation

## 2013-03-22 DIAGNOSIS — F329 Major depressive disorder, single episode, unspecified: Secondary | ICD-10-CM

## 2013-03-22 DIAGNOSIS — F3289 Other specified depressive episodes: Secondary | ICD-10-CM

## 2013-03-22 LAB — CBC WITH DIFFERENTIAL/PLATELET
Basophils Absolute: 0 10*3/uL (ref 0.0–0.1)
Basophils Relative: 0.4 % (ref 0.0–3.0)
Eosinophils Absolute: 0.2 10*3/uL (ref 0.0–0.7)
Eosinophils Relative: 2.3 % (ref 0.0–5.0)
HCT: 39.6 % (ref 36.0–46.0)
Hemoglobin: 13.3 g/dL (ref 12.0–15.0)
Lymphocytes Relative: 47.8 % — ABNORMAL HIGH (ref 12.0–46.0)
Lymphs Abs: 3.6 10*3/uL (ref 0.7–4.0)
MCHC: 33.7 g/dL (ref 30.0–36.0)
MCV: 92.9 fl (ref 78.0–100.0)
Monocytes Absolute: 0.4 10*3/uL (ref 0.1–1.0)
Monocytes Relative: 5.3 % (ref 3.0–12.0)
Neutro Abs: 3.3 10*3/uL (ref 1.4–7.7)
Neutrophils Relative %: 44.2 % (ref 43.0–77.0)
Platelets: 364 10*3/uL (ref 150.0–400.0)
RBC: 4.26 Mil/uL (ref 3.87–5.11)
RDW: 13 % (ref 11.5–14.6)
WBC: 7.4 10*3/uL (ref 4.5–10.5)

## 2013-03-22 LAB — HEPATIC FUNCTION PANEL
ALT: 25 U/L (ref 0–35)
AST: 21 U/L (ref 0–37)
Albumin: 4.2 g/dL (ref 3.5–5.2)
Alkaline Phosphatase: 70 U/L (ref 39–117)
Bilirubin, Direct: 0 mg/dL (ref 0.0–0.3)
Total Bilirubin: 0.6 mg/dL (ref 0.3–1.2)
Total Protein: 8.1 g/dL (ref 6.0–8.3)

## 2013-03-22 LAB — LIPID PANEL
Cholesterol: 188 mg/dL (ref 0–200)
HDL: 48.4 mg/dL (ref >39.00)
LDL Cholesterol: 115 mg/dL — ABNORMAL HIGH (ref 0–99)
Total CHOL/HDL Ratio: 4
Triglycerides: 124 mg/dL (ref 0.0–149.0)
VLDL: 24.8 mg/dL (ref 0.0–40.0)

## 2013-03-22 LAB — BASIC METABOLIC PANEL
BUN: 13 mg/dL (ref 6–23)
CO2: 27 mEq/L (ref 19–32)
Calcium: 9.1 mg/dL (ref 8.4–10.5)
Chloride: 107 mEq/L (ref 96–112)
Creatinine, Ser: 0.9 mg/dL (ref 0.4–1.2)
GFR: 83.17 mL/min (ref >60.00)
Glucose, Bld: 103 mg/dL — ABNORMAL HIGH (ref 70–99)
Potassium: 3.8 mEq/L (ref 3.5–5.1)
Sodium: 141 mEq/L (ref 135–145)

## 2013-03-22 LAB — TSH: TSH: 0.48 u[IU]/mL (ref 0.35–5.50)

## 2013-03-22 MED ORDER — ALPRAZOLAM 0.5 MG PO TABS: 0.5000 mg | ORAL_TABLET | Freq: Three times a day (TID) | ORAL | Status: DC | PRN

## 2013-03-22 MED ORDER — BUPROPION HCL ER (XL) 150 MG PO TB24: 150.0000 mg | ORAL_TABLET | Freq: Every day | ORAL | Status: DC

## 2013-03-22 MED ORDER — ZOLPIDEM TARTRATE 10 MG PO TABS: 10.0000 mg | ORAL_TABLET | Freq: Every evening | ORAL | Status: DC | PRN

## 2013-03-22 MED ORDER — ALBUTEROL SULFATE HFA 108 (90 BASE) MCG/ACT IN AERS: 2.0000 | INHALATION_SPRAY | Freq: Four times a day (QID) | RESPIRATORY_TRACT | Status: DC | PRN

## 2013-03-22 MED ORDER — CETIRIZINE HCL 10 MG PO TABS: 10.0000 mg | ORAL_TABLET | Freq: Every day | ORAL | Status: DC

## 2013-03-22 MED ORDER — MONTELUKAST SODIUM 10 MG PO TABS: 10.0000 mg | ORAL_TABLET | Freq: Every day | ORAL | Status: DC

## 2013-03-22 MED ORDER — MOMETASONE FUROATE 50 MCG/ACT NA SUSP: 2.0000 | Freq: Every day | NASAL | Status: DC

## 2013-03-22 MED ORDER — SERTRALINE HCL 100 MG PO TABS: 100.0000 mg | ORAL_TABLET | Freq: Every day | ORAL | Status: DC

## 2013-03-22 NOTE — Assessment & Plan Note (Signed)
Deteriorated.  Add Wellbutrin and script given for xanax to use prn.  Discussed counseling.  Will follow closely.

## 2013-03-22 NOTE — Assessment & Plan Note (Signed)
New.  Pneumovax given today.

## 2013-03-22 NOTE — Telephone Encounter (Signed)
Unable to reach prior to visit  

## 2013-03-22 NOTE — Patient Instructions (Addendum)
Follow up in 4-6 weeks to recheck mood We'll notify you of your lab results and make any changes if needed Start the Wellbutrin daily (in addition to the Zoloft) Add the xanax as needed Call with any questions or concerns Hang in there!!!

## 2013-03-22 NOTE — Progress Notes (Signed)
  Subjective:    Patient ID: Katelyn Reyes, female    DOB: 11-16-1960, 52 y.o.   MRN: [id number]  HPI CPE- UTD on GYN.   Review of Systems Patient reports no vision/ hearing changes, adenopathy,fever, weight change,  persistant/recurrent hoarseness , swallowing issues, chest pain, palpitations, edema, persistant/recurrent cough, hemoptysis, dyspnea (rest/exertional/paroxysmal nocturnal), gastrointestinal bleeding (melena, rectal bleeding), abdominal pain, significant heartburn, bowel changes, GU symptoms (dysuria, hematuria, incontinence), Gyn symptoms (abnormal  bleeding, pain),  syncope, focal weakness, memory loss, numbness & tingling, skin/hair/nail changes, abnormal bruising or bleeding.  Depression- son is struggling w/ possibility of being transgendered.  Pt is upset and wants to protect son from those who are not accepting.  Difficulty sleeping, difficulty dealing w/ work stress.  Quick to anger.  Doesn't feel current SSRI dose is adequate.  Tearful in office.     Objective:   Physical Exam General Appearance:    Alert, cooperative, no distress, appears stated age  Head:    Normocephalic, without obvious abnormality, atraumatic  Eyes:    PERRL, conjunctiva/corneas clear, EOM's intact, fundi    benign, both eyes  Ears:    Normal TM's and external ear canals, both ears  Nose:   Nares normal, septum midline, mucosa normal, no drainage    or sinus tenderness  Throat:   Lips, mucosa, and tongue normal; teeth and gums normal  Neck:   Supple, symmetrical, trachea midline, no adenopathy;    Thyroid: no enlargement/tenderness/nodules  Back:     Symmetric, no curvature, ROM normal, no CVA tenderness  Lungs:     Clear to auscultation bilaterally, respirations unlabored  Chest Wall:    No tenderness or deformity   Heart:    Regular rate and rhythm, S1 and S2 normal, no murmur, rub   or gallop  Breast Exam:    Deferred to GYN  Abdomen:     Soft, non-tender, bowel sounds active all four  quadrants,    no masses, no organomegaly  Genitalia:    Deferred to GYN  Rectal:    Extremities:   Extremities normal, atraumatic, no cyanosis or edema  Pulses:   2+ and symmetric all extremities  Skin:   Skin color, texture, turgor normal, no rashes or lesions  Lymph nodes:   Cervical, supraclavicular, and axillary nodes normal  Neurologic:   CNII-XII intact, normal strength, sensation and reflexes    throughout          Assessment & Plan:

## 2013-03-22 NOTE — Assessment & Plan Note (Signed)
Pt's PE WNL.  UTD on GYN.  Check labs.  Anticipatory guidance provided.  

## 2013-03-23 ENCOUNTER — Encounter: Payer: Self-pay | Admitting: General Practice

## 2013-03-23 ENCOUNTER — Encounter (INDEPENDENT_AMBULATORY_CARE_PROVIDER_SITE_OTHER): Payer: Self-pay

## 2013-03-23 ENCOUNTER — Encounter (INDEPENDENT_AMBULATORY_CARE_PROVIDER_SITE_OTHER): Payer: Self-pay | Admitting: Surgery

## 2013-03-23 ENCOUNTER — Ambulatory Visit (INDEPENDENT_AMBULATORY_CARE_PROVIDER_SITE_OTHER): Payer: Commercial Managed Care - PPO | Admitting: Surgery

## 2013-03-23 VITALS — BP 100/68 | HR 98 | Temp 97.9°F | Ht 63.0 in | Wt 201.6 lb

## 2013-03-23 DIAGNOSIS — Z09 Encounter for follow-up examination after completed treatment for conditions other than malignant neoplasm: Secondary | ICD-10-CM

## 2013-03-23 NOTE — Patient Instructions (Signed)
We will see you again on an as needed basis. Please call the office at [phone] if you have any questions or concerns. Thank you for allowing us to take care of you.  

## 2013-03-23 NOTE — Progress Notes (Signed)
She is in for a second postop visit following surgical biopsy right breast upper inner quadrant. At her last visit I aspirated some serosanguineous fluid and she is in for recheck.  On examas far there was no evidence of infection. There did not appear to be fluid to palpation but on then aspirated the area at the patient's request. Only 1 cc of some bloody fluid was retrieved. I think most of the changes are just postop thickening and should improve over time.  Discussed this with the patient She'll return when necessary.

## 2013-03-25 ENCOUNTER — Encounter (INDEPENDENT_AMBULATORY_CARE_PROVIDER_SITE_OTHER): Payer: Commercial Managed Care - PPO | Admitting: Surgery

## 2013-03-26 LAB — VITAMIN D 1,25 DIHYDROXY
Vitamin D 1, 25 (OH)2 Total: 79 pg/mL — ABNORMAL HIGH (ref 18–72)
Vitamin D2 1, 25 (OH)2: 14 pg/mL
Vitamin D3 1, 25 (OH)2: 65 pg/mL

## 2013-03-28 ENCOUNTER — Encounter: Payer: Self-pay | Admitting: General Practice

## 2013-12-09 ENCOUNTER — Encounter (HOSPITAL_COMMUNITY): Payer: Self-pay | Admitting: *Deleted

## 2013-12-09 ENCOUNTER — Encounter (HOSPITAL_COMMUNITY): Payer: Self-pay

## 2013-12-09 ENCOUNTER — Other Ambulatory Visit: Payer: Self-pay | Admitting: Obstetrics and Gynecology

## 2013-12-21 ENCOUNTER — Other Ambulatory Visit: Payer: Self-pay | Admitting: Obstetrics and Gynecology

## 2013-12-21 NOTE — H&P (Signed)
  Admission History and Physical Exam for a Gynecology Patient  Katelyn Reyes is a 53 y.o. female, No obstetric history on file., who presents for a Burch Cystourethropexy, and A and P repair. She has been followed at the [facility] and Gynecology division of [facility]. She has SUI and pelvic relaxation.  OB History   Grav Para Term Preterm Abortions TAB SAB Ect Mult Living                  Past Medical History  Diagnosis Date  . Allergic rhinitis   . Hyperlipidemia   . IUD 02/07/10  . Breast calcification, right 02/02/2013    Surgically excised 02/17/13 > path benign   . Asthma     related to seasonal allergies  . Depression     No prescriptions prior to admission    Past Surgical History  Procedure Laterality Date  . Mouth surgery    . Nasal sinus surgery    . Breast biopsy Right 02/17/2013    Procedure:  NEEDLE LOCALIZATION REMOVAL RIGHT BREAST CALCIFICATIONS;  Surgeon: Haywood Lasso, MD;  Location: [place];  Service: General;  Laterality: Right;    Allergies  Allergen Reactions  . Erythromycin Nausea Only  . Hydrocodone-Acetaminophen     REACTION: hallucinations    Family History: family history includes Breast cancer in an other family member; Colon cancer in her paternal grandmother; Diabetes in her father; Fibrocystic breast disease in her mother; Hypertension in her father; Stroke in her maternal grandmother. There is no history of Coronary artery disease.  Social History:  reports that she has never smoked. She has never used smokeless tobacco. She reports that she does not drink alcohol or use illicit drugs.  Review of systems: See HPI.  Admission Physical Exam:    There is no weight on file to calculate BMI.  There were no vitals taken for this visit.  HEENT:                 Within normal limits Chest:                   Clear Heart:                    Regular rate and rhythm Breasts:                 No masses, skin changes, bleeding, or discharge present Abdomen:             Nontender, no masses Extremities:          Grossly normal Neurologic exam: Grossly normal  Pelvic exam:  External genitalia: normal general appearance Vaginal: cystocele, small rectocele, loss of UV angle Cervix: normal appearance Adnexa: normal bimanual exam Uterus: normal size  Assessment:  SUI  Pelvic relaxation  Obesity  Asthma  Depression  Plan:  Treatment options reviewed including observation only, PT, Kegel exercises, sling procedures, and cystourethropexy. R and B outlined. Questions answered. She wants to proceed with Burch procedure, A and P repair.   [name] 12/21/2013

## 2013-12-22 ENCOUNTER — Encounter (HOSPITAL_COMMUNITY): Payer: Self-pay | Admitting: *Deleted

## 2013-12-22 ENCOUNTER — Encounter (HOSPITAL_COMMUNITY): Payer: 59 | Admitting: Anesthesiology

## 2013-12-22 ENCOUNTER — Encounter (HOSPITAL_COMMUNITY)
Admission: RE | Disposition: A | Discharge status: Home / Self Care | Payer: Self-pay | Source: Ambulatory Visit | Attending: Obstetrics and Gynecology

## 2013-12-22 ENCOUNTER — Observation Stay (HOSPITAL_COMMUNITY)
Admission: RE | Admit: 2013-12-22 | Discharge: 2013-12-25 | Disposition: A | Discharge status: Home / Self Care | Payer: 59 | Source: Ambulatory Visit | Attending: Obstetrics and Gynecology | Admitting: Obstetrics and Gynecology

## 2013-12-22 ENCOUNTER — Ambulatory Visit (HOSPITAL_COMMUNITY): Payer: 59 | Admitting: Anesthesiology

## 2013-12-22 DIAGNOSIS — N8111 Cystocele, midline: Secondary | ICD-10-CM | POA: Insufficient documentation

## 2013-12-22 DIAGNOSIS — F3289 Other specified depressive episodes: Secondary | ICD-10-CM | POA: Insufficient documentation

## 2013-12-22 DIAGNOSIS — Z800 Family history of malignant neoplasm of digestive organs: Secondary | ICD-10-CM | POA: Insufficient documentation

## 2013-12-22 DIAGNOSIS — E785 Hyperlipidemia, unspecified: Secondary | ICD-10-CM | POA: Insufficient documentation

## 2013-12-22 DIAGNOSIS — F329 Major depressive disorder, single episode, unspecified: Secondary | ICD-10-CM | POA: Insufficient documentation

## 2013-12-22 DIAGNOSIS — Z803 Family history of malignant neoplasm of breast: Secondary | ICD-10-CM | POA: Insufficient documentation

## 2013-12-22 DIAGNOSIS — Z833 Family history of diabetes mellitus: Secondary | ICD-10-CM | POA: Insufficient documentation

## 2013-12-22 DIAGNOSIS — Z8249 Family history of ischemic heart disease and other diseases of the circulatory system: Secondary | ICD-10-CM | POA: Insufficient documentation

## 2013-12-22 DIAGNOSIS — R32 Unspecified urinary incontinence: Secondary | ICD-10-CM | POA: Diagnosis present

## 2013-12-22 DIAGNOSIS — J45909 Unspecified asthma, uncomplicated: Secondary | ICD-10-CM | POA: Insufficient documentation

## 2013-12-22 DIAGNOSIS — E669 Obesity, unspecified: Secondary | ICD-10-CM | POA: Insufficient documentation

## 2013-12-22 DIAGNOSIS — N393 Stress incontinence (female) (male): Principal | ICD-10-CM | POA: Insufficient documentation

## 2013-12-22 DIAGNOSIS — R339 Retention of urine, unspecified: Secondary | ICD-10-CM | POA: Insufficient documentation

## 2013-12-22 HISTORY — PX: BURCH PROCEDURE: SHX1273

## 2013-12-22 LAB — PREGNANCY, URINE: Preg Test, Ur: NEGATIVE

## 2013-12-22 LAB — CBC
HCT: 36.2 % (ref 36.0–46.0)
Hemoglobin: 12.4 g/dL (ref 12.0–15.0)
MCH: 31.3 pg (ref 26.0–34.0)
MCHC: 34.3 g/dL (ref 30.0–36.0)
MCV: 91.4 fL (ref 78.0–100.0)
Platelets: 285 10*3/uL (ref 150–400)
RBC: 3.96 MIL/uL (ref 3.87–5.11)
RDW: 12.2 % (ref 11.5–15.5)
WBC: 7.5 10*3/uL (ref 4.0–10.5)

## 2013-12-22 SURGERY — COLPOSUSPENSION, BURCH
Anesthesia: General

## 2013-12-22 MED ORDER — LACTATED RINGERS IV SOLN
INTRAVENOUS | Status: DC
  Administered 2013-12-22 (×3): via INTRAVENOUS

## 2013-12-22 MED ORDER — CEFAZOLIN SODIUM-DEXTROSE 2-3 GM-% IV SOLR
2.0000 g | Freq: Once | INTRAVENOUS | Status: AC
  Administered 2013-12-22: 2 g via INTRAVENOUS
  Filled 2013-12-22: qty 50

## 2013-12-22 MED ORDER — ALPRAZOLAM 0.25 MG PO TABS: 0.2500 mg | ORAL_TABLET | Freq: Every evening | ORAL | Status: DC | PRN

## 2013-12-22 MED ORDER — FENTANYL CITRATE 0.05 MG/ML IJ SOLN
INTRAMUSCULAR | Status: DC | PRN
  Administered 2013-12-22: 100 ug via INTRAVENOUS
  Administered 2013-12-22 (×3): 50 ug via INTRAVENOUS
  Administered 2013-12-22: 100 ug via INTRAVENOUS
  Administered 2013-12-22 (×2): 50 ug via INTRAVENOUS

## 2013-12-22 MED ORDER — DIPHENHYDRAMINE HCL 12.5 MG/5ML PO ELIX: 12.5000 mg | ORAL_SOLUTION | Freq: Four times a day (QID) | ORAL | Status: DC | PRN

## 2013-12-22 MED ORDER — MENTHOL 3 MG MT LOZG: 1.0000 | LOZENGE | OROMUCOSAL | Status: DC | PRN

## 2013-12-22 MED ORDER — KETOROLAC TROMETHAMINE 30 MG/ML IJ SOLN
INTRAMUSCULAR | Status: AC
  Filled 2013-12-22: qty 2

## 2013-12-22 MED ORDER — DEXAMETHASONE SODIUM PHOSPHATE 10 MG/ML IJ SOLN
INTRAMUSCULAR | Status: DC | PRN
  Administered 2013-12-22: 10 mg via INTRAVENOUS

## 2013-12-22 MED ORDER — ROCURONIUM BROMIDE 100 MG/10ML IV SOLN
INTRAVENOUS | Status: DC | PRN
  Administered 2013-12-22: 40 mg via INTRAVENOUS
  Administered 2013-12-22 (×2): 10 mg via INTRAVENOUS

## 2013-12-22 MED ORDER — KETOROLAC TROMETHAMINE 30 MG/ML IJ SOLN: 15.0000 mg | Freq: Once | INTRAMUSCULAR | Status: DC | PRN

## 2013-12-22 MED ORDER — FENTANYL CITRATE 0.05 MG/ML IJ SOLN
INTRAMUSCULAR | Status: AC
  Filled 2013-12-22: qty 5

## 2013-12-22 MED ORDER — PROMETHAZINE HCL 25 MG PO TABS: 12.5000 mg | ORAL_TABLET | Freq: Four times a day (QID) | ORAL | Status: DC | PRN

## 2013-12-22 MED ORDER — NEOSTIGMINE METHYLSULFATE 10 MG/10ML IV SOLN
INTRAVENOUS | Status: DC | PRN
  Administered 2013-12-22: 2.5 mg via INTRAVENOUS

## 2013-12-22 MED ORDER — FENTANYL CITRATE 0.05 MG/ML IJ SOLN: 25.0000 ug | INTRAMUSCULAR | Status: DC | PRN

## 2013-12-22 MED ORDER — DIPHENHYDRAMINE HCL 50 MG/ML IJ SOLN: 12.5000 mg | Freq: Four times a day (QID) | INTRAMUSCULAR | Status: DC | PRN

## 2013-12-22 MED ORDER — BUPIVACAINE-EPINEPHRINE (PF) 0.5% -1:200000 IJ SOLN
INTRAMUSCULAR | Status: DC | PRN
  Administered 2013-12-22: 30 mL

## 2013-12-22 MED ORDER — MIDAZOLAM HCL 5 MG/5ML IJ SOLN
INTRAMUSCULAR | Status: DC | PRN
  Administered 2013-12-22: 2 mg via INTRAVENOUS

## 2013-12-22 MED ORDER — ONDANSETRON HCL 4 MG/2ML IJ SOLN
INTRAMUSCULAR | Status: AC
  Filled 2013-12-22: qty 2

## 2013-12-22 MED ORDER — ESTRADIOL 0.1 MG/GM VA CREA
TOPICAL_CREAM | VAGINAL | Status: DC | PRN
  Administered 2013-12-22: 1 via VAGINAL

## 2013-12-22 MED ORDER — ONDANSETRON HCL 4 MG/2ML IJ SOLN
INTRAMUSCULAR | Status: DC | PRN
  Administered 2013-12-22: 4 mg via INTRAVENOUS

## 2013-12-22 MED ORDER — MEPERIDINE HCL 25 MG/ML IJ SOLN: 6.2500 mg | INTRAMUSCULAR | Status: DC | PRN

## 2013-12-22 MED ORDER — ESTRADIOL 0.1 MG/GM VA CREA
TOPICAL_CREAM | VAGINAL | Status: AC
  Filled 2013-12-22: qty 42.5

## 2013-12-22 MED ORDER — 0.9 % SODIUM CHLORIDE (POUR BTL) OPTIME
TOPICAL | Status: DC | PRN
  Administered 2013-12-22 (×2): 1000 mL

## 2013-12-22 MED ORDER — NALOXONE HCL 0.4 MG/ML IJ SOLN: 0.4000 mg | INTRAMUSCULAR | Status: DC | PRN

## 2013-12-22 MED ORDER — GLYCOPYRROLATE 0.2 MG/ML IJ SOLN
INTRAMUSCULAR | Status: AC
  Filled 2013-12-22: qty 4

## 2013-12-22 MED ORDER — LIDOCAINE HCL (CARDIAC) 20 MG/ML IV SOLN
INTRAVENOUS | Status: DC | PRN
  Administered 2013-12-22: 60 mg via INTRAVENOUS

## 2013-12-22 MED ORDER — EPHEDRINE 5 MG/ML INJ
INTRAVENOUS | Status: DC | PRN
  Administered 2013-12-22: 10 mg via INTRAVENOUS

## 2013-12-22 MED ORDER — NEOSTIGMINE METHYLSULFATE 10 MG/10ML IV SOLN
INTRAVENOUS | Status: AC
  Filled 2013-12-22: qty 1

## 2013-12-22 MED ORDER — PROMETHAZINE HCL 25 MG/ML IJ SOLN: 6.2500 mg | INTRAMUSCULAR | Status: DC | PRN

## 2013-12-22 MED ORDER — OXYCODONE-ACETAMINOPHEN 5-325 MG PO TABS
1.0000 | ORAL_TABLET | ORAL | Status: DC | PRN
  Administered 2013-12-23 – 2013-12-25 (×10): 2 via ORAL
  Filled 2013-12-22: qty 1
  Filled 2013-12-22 (×10): qty 2

## 2013-12-22 MED ORDER — LACTATED RINGERS IV SOLN
INTRAVENOUS | Status: DC
  Administered 2013-12-22: 22:00:00 via INTRAVENOUS

## 2013-12-22 MED ORDER — LIDOCAINE HCL (CARDIAC) 20 MG/ML IV SOLN
INTRAVENOUS | Status: AC
  Filled 2013-12-22: qty 5

## 2013-12-22 MED ORDER — ONDANSETRON HCL 4 MG/2ML IJ SOLN: 4.0000 mg | Freq: Four times a day (QID) | INTRAMUSCULAR | Status: DC | PRN

## 2013-12-22 MED ORDER — IBUPROFEN 600 MG PO TABS
600.0000 mg | ORAL_TABLET | Freq: Four times a day (QID) | ORAL | Status: DC | PRN
  Administered 2013-12-23: 600 mg via ORAL
  Filled 2013-12-22: qty 1

## 2013-12-22 MED ORDER — CEFAZOLIN SODIUM-DEXTROSE 2-3 GM-% IV SOLR
INTRAVENOUS | Status: AC
  Filled 2013-12-22: qty 50

## 2013-12-22 MED ORDER — HYDROMORPHONE 0.3 MG/ML IV SOLN
INTRAVENOUS | Status: DC
  Administered 2013-12-22: 14:00:00 via INTRAVENOUS
  Administered 2013-12-22: 1.8 mg via INTRAVENOUS
  Administered 2013-12-22: 5 mg via INTRAVENOUS
  Administered 2013-12-23: 1.2 mg via INTRAVENOUS
  Filled 2013-12-22: qty 25

## 2013-12-22 MED ORDER — MONTELUKAST SODIUM 10 MG PO TABS
10.0000 mg | ORAL_TABLET | Freq: Every day | ORAL | Status: DC
  Administered 2013-12-22 – 2013-12-24 (×3): 10 mg via ORAL
  Filled 2013-12-22 (×3): qty 1

## 2013-12-22 MED ORDER — KETOROLAC TROMETHAMINE 30 MG/ML IJ SOLN
INTRAMUSCULAR | Status: DC | PRN
  Administered 2013-12-22: 30 mg via INTRAVENOUS
  Administered 2013-12-22: 30 mg via INTRAMUSCULAR

## 2013-12-22 MED ORDER — PROPOFOL 10 MG/ML IV BOLUS
INTRAVENOUS | Status: DC | PRN
  Administered 2013-12-22: 150 mg via INTRAVENOUS

## 2013-12-22 MED ORDER — MIDAZOLAM HCL 2 MG/2ML IJ SOLN
INTRAMUSCULAR | Status: AC
  Filled 2013-12-22: qty 2

## 2013-12-22 MED ORDER — PROPOFOL 10 MG/ML IV EMUL
INTRAVENOUS | Status: AC
  Filled 2013-12-22: qty 20

## 2013-12-22 MED ORDER — SODIUM CHLORIDE 0.9 % IJ SOLN: 9.0000 mL | INTRAMUSCULAR | Status: DC | PRN

## 2013-12-22 MED ORDER — DEXAMETHASONE SODIUM PHOSPHATE 10 MG/ML IJ SOLN
INTRAMUSCULAR | Status: AC
  Filled 2013-12-22: qty 1

## 2013-12-22 MED ORDER — EPHEDRINE 5 MG/ML INJ
INTRAVENOUS | Status: AC
Start: 2013-12-22 — End: 2013-12-22
  Filled 2013-12-22: qty 10

## 2013-12-22 MED ORDER — SODIUM CHLORIDE 0.9 % IJ SOLN: INTRAMUSCULAR | Status: DC | PRN

## 2013-12-22 MED ORDER — SERTRALINE HCL 100 MG PO TABS
100.0000 mg | ORAL_TABLET | Freq: Every day | ORAL | Status: DC
  Administered 2013-12-23 – 2013-12-25 (×3): 100 mg via ORAL
  Filled 2013-12-22 (×3): qty 1

## 2013-12-22 MED ORDER — ALBUTEROL SULFATE (2.5 MG/3ML) 0.083% IN NEBU: 3.0000 mL | INHALATION_SOLUTION | Freq: Four times a day (QID) | RESPIRATORY_TRACT | Status: DC | PRN

## 2013-12-22 MED ORDER — BUPIVACAINE-EPINEPHRINE (PF) 0.5% -1:200000 IJ SOLN
INTRAMUSCULAR | Status: AC
  Filled 2013-12-22: qty 30

## 2013-12-22 MED ORDER — GLYCOPYRROLATE 0.2 MG/ML IJ SOLN
INTRAMUSCULAR | Status: DC | PRN
  Administered 2013-12-22: 0.1 mg via INTRAVENOUS
  Administered 2013-12-22: .4 mg via INTRAVENOUS

## 2013-12-22 SURGICAL SUPPLY — 49 items
CATH FOLEY 3WAY 30CC 18FR (CATHETERS) ×2 IMPLANT
CATH ROBINSON RED A/P 16FR (CATHETERS) ×2 IMPLANT
CHLORAPREP W/TINT 26ML (MISCELLANEOUS) ×2 IMPLANT
CLOTH BEACON ORANGE TIMEOUT ST (SAFETY) ×2 IMPLANT
CONTAINER PREFILL 10% NBF 60ML (FORM) IMPLANT
DECANTER SPIKE VIAL GLASS SM (MISCELLANEOUS) IMPLANT
DRAPE UNDERBUTTOCKS STRL (DRAPE) ×2 IMPLANT
DRAPE WARM FLUID 44X44 (DRAPE) IMPLANT
DRSG OPSITE POSTOP 4X10 (GAUZE/BANDAGES/DRESSINGS) ×2 IMPLANT
FORMULA 20CAL 3 OZ MEAD (FORMULA) ×2 IMPLANT
GAUZE PACKING 2X5 YD STRL (GAUZE/BANDAGES/DRESSINGS) ×2 IMPLANT
GAUZE SPONGE 4X4 16PLY XRAY LF (GAUZE/BANDAGES/DRESSINGS) ×2 IMPLANT
GLOVE BIO SURGEON STRL SZ8 (GLOVE) ×2 IMPLANT
GLOVE BIOGEL PI IND STRL 8.5 (GLOVE) ×1 IMPLANT
GLOVE BIOGEL PI INDICATOR 8.5 (GLOVE) ×1
GLOVE ECLIPSE 8.0 STRL XLNG CF (GLOVE) ×4 IMPLANT
GLOVE ORTHO TXT STRL SZ7.5 (GLOVE) IMPLANT
GOWN STRL REUS W/TWL 2XL LVL3 (GOWN DISPOSABLE) ×2 IMPLANT
GOWN STRL REUS W/TWL LRG LVL3 (GOWN DISPOSABLE) ×4 IMPLANT
NEEDLE FILTER BLUNT 18X 1/2SAF (NEEDLE)
NEEDLE FILTER BLUNT 18X1 1/2 (NEEDLE) IMPLANT
NEEDLE HYPO 25X1 1.5 SAFETY (NEEDLE) IMPLANT
NEEDLE MAYO 6 CRC TAPER PT (NEEDLE) ×2 IMPLANT
NEEDLE SPNL 22GX3.5 QUINCKE BK (NEEDLE) ×2 IMPLANT
NS IRRIG 1000ML POUR BTL (IV SOLUTION) ×2 IMPLANT
PACK ABDOMINAL GYN (CUSTOM PROCEDURE TRAY) ×2 IMPLANT
PACK VAGINAL MINOR WOMEN LF (CUSTOM PROCEDURE TRAY) IMPLANT
PAD ABD 8X7 1/2 STERILE (GAUZE/BANDAGES/DRESSINGS) ×2 IMPLANT
PAD PREP 24X48 CUFFED NSTRL (MISCELLANEOUS) ×2 IMPLANT
PLUG CATH AND CAP STER (CATHETERS) ×2 IMPLANT
SET CYSTO W/LG BORE CLAMP LF (SET/KITS/TRAYS/PACK) ×2 IMPLANT
SHEET LAVH (DRAPES) ×2 IMPLANT
SPONGE GAUZE 4X4 12PLY (GAUZE/BANDAGES/DRESSINGS) ×2 IMPLANT
STAPLER VISISTAT 35W (STAPLE) IMPLANT
SUT CHROMIC 0 CT 1 (SUTURE) ×4 IMPLANT
SUT MNCRL AB 3-0 PS2 27 (SUTURE) ×2 IMPLANT
SUT MON AB-0 CT1 36 (SUTURE) ×4 IMPLANT
SUT PDS AB 0 CT1 27 (SUTURE) ×8 IMPLANT
SUT VIC AB 0 CT1 27 (SUTURE) ×2
SUT VIC AB 0 CT1 27XBRD ANBCTR (SUTURE) ×2 IMPLANT
SUT VICRYL 0 UR6 27IN ABS (SUTURE) IMPLANT
SYR 30ML LL (SYRINGE) IMPLANT
SYR CONTROL 10ML LL (SYRINGE) ×2 IMPLANT
SYRINGE 60CC LL (MISCELLANEOUS) IMPLANT
TAPE CLOTH SURG 4X10 WHT LF (GAUZE/BANDAGES/DRESSINGS) ×2 IMPLANT
TOWEL OR 17X24 6PK STRL BLUE (TOWEL DISPOSABLE) ×4 IMPLANT
TRAY FOLEY CATH 14FR (SET/KITS/TRAYS/PACK) IMPLANT
WATER STERILE IRR 1000ML POUR (IV SOLUTION) ×2 IMPLANT
YANKAUER SUCT BULB TIP NO VENT (SUCTIONS) ×4 IMPLANT

## 2013-12-22 NOTE — Anesthesia Preprocedure Evaluation (Addendum)
Anesthesia Evaluation  Patient identified by MRN, date of birth, ID band Patient awake    Reviewed: Allergy & Precautions, H&P , NPO status , Patient's Chart, lab work & pertinent test results  Airway Mallampati: II TM Distance: >3 FB Neck ROM: full    Dental no notable dental hx. (+) Teeth Intact   Pulmonary    Pulmonary exam normal       Cardiovascular negative cardio ROS      Neuro/Psych negative neurological ROS     GI/Hepatic negative GI ROS, Neg liver ROS,   Endo/Other  negative endocrine ROS  Renal/GU negative Renal ROS     Musculoskeletal   Abdominal Normal abdominal exam  (+)   Peds  Hematology negative hematology ROS (+)   Anesthesia Other Findings   Reproductive/Obstetrics negative OB ROS                           Anesthesia Physical Anesthesia Plan  ASA: II  Anesthesia Plan: General   Post-op Pain Management:    Induction: Intravenous  Airway Management Planned: Oral ETT  Additional Equipment:   Intra-op Plan:   Post-operative Plan: Extubation in OR  Informed Consent: I have reviewed the patients History and Physical, chart, labs and discussed the procedure including the risks, benefits and alternatives for the proposed anesthesia with the patient or authorized representative who has indicated his/her understanding and acceptance.   Dental advisory given  Plan Discussed with: CRNA and Surgeon  Anesthesia Plan Comments:        Anesthesia Quick Evaluation

## 2013-12-22 NOTE — H&P (Signed)
The patient was interviewed and examined today.  The previously documented history and physical examination was reviewed. There are no changes. The operative procedure was reviewed. The risks and benefits were outlined again. The specific risks include, but are not limited to, anesthetic complications, bleeding, infections, and possible damage to the surrounding organs. The patient's questions were answered.  We are ready to proceed as outlined. The likelihood of the patient achieving the goals of this procedure is very likely.   BP 123/82  Pulse 75  Temp(Src) 98.1 F (36.7 C) (Oral)  Resp 18  Ht 5\' 2"  (1.575 m)  Wt 207 lb (93.895 kg)  BMI 37.85 kg/m2  SpO2 98%  Results for orders placed during the hospital encounter of 12/22/13 (from the past 24 hour(s))  PREGNANCY, URINE     Status: None   Collection Time    12/22/13  9:00 AM      Result Value Ref Range   Preg Test, Ur NEGATIVE  NEGATIVE  CBC     Status: None   Collection Time    12/22/13  9:20 AM      Result Value Ref Range   WBC 7.5  4.0 - 10.5 K/uL   RBC 3.96  3.87 - 5.11 MIL/uL   Hemoglobin 12.4  12.0 - 15.0 g/dL   HCT 36.2  36.0 - 46.0 %   MCV 91.4  78.0 - 100.0 fL   MCH 31.3  26.0 - 34.0 pg   MCHC 34.3  30.0 - 36.0 g/dL   RDW 12.2  11.5 - 15.5 %   Platelets 285  150 - 400 K/uL     Gildardo Cranker, M.D.

## 2013-12-22 NOTE — Progress Notes (Signed)
Day of Surgery Procedure(s) (LRB): BURCH CYSTO URETHROPEXY, ANTERIOR AND POSTERIOR CULPORRHAPHY (N/A)   Subjective: Patient reports tolerating PO.    Objective: I have reviewed patient's vital signs.  General: no distress Resp: clear to auscultation bilaterally Cardio: regular rate and rhythm, S1, S2 normal, no murmur, click, rub or gallop GI: soft, non-tender; bowel sounds normal; no masses,  no organomegaly Extremities: extremities normal, atraumatic, no cyanosis or edema  Assessment: s/p Procedure(s): BURCH CYSTO URETHROPEXY, ANTERIOR AND POSTERIOR CULPORRHAPHY: stable  Plan: Advance diet Encourage ambulation BEGIN VOIDING TRIAL IN THE MORNING. MAY GO HOME ON 12/23/13 IF ABLE TO VOID, AND RESIDUALS LESS THAN 75 CC'S.  LOS: 0 days    Katelyn Reyes V 12/22/2013, 10:09 PM

## 2013-12-22 NOTE — Transfer of Care (Signed)
Immediate Anesthesia Transfer of Care Note  Patient: Katelyn Reyes  Procedure(s) Performed: Procedure(s): BURCH CYSTO URETHROPEXY, ANTERIOR AND POSTERIOR CULPORRHAPHY (N/A)  Patient Location: PACU  Anesthesia Type:General  Level of Consciousness: sedated  Airway & Oxygen Therapy: Patient Spontanous Breathing and Patient connected to nasal cannula oxygen  Post-op Assessment: Report given to PACU RN and Post -op Vital signs reviewed and stable  Post vital signs: stable  Complications: No apparent anesthesia complications

## 2013-12-22 NOTE — Op Note (Signed)
OPERATIVE NOTE  Katelyn Reyes  DOB:    1961-02-18  MRN:    [id number]  CSN:    [id number]  Date of Surgery:  12/22/2013  Preoperative Diagnosis:  Stress urinary incontinence  Pelvic relaxation with a cystocele, rectocele, and loss of the urethrovesical angle  Obesity  Asthma  Postoperative Diagnosis:  Same  Procedure:  Burch cystourethropexy  Anterior and posterior colporrhaphy  Surgeon:  Gildardo Cranker, M.D.  Assistant:  Earnstine Regal, PA-C  Anesthetic:  General  Disposition:  The patient is a 53 year old with stress urinary incontinence and pelvic relaxation symptoms. She understands the indications for surgical procedure as well as her alternative treatment options. She accepts the risk of, but not limited to, anesthetic complications, bleeding, infections, possible damage to the surrounding organs, and persistent urinary retention.  Findings:  The patient had a cystocele, rectocele, and loss of the urethrovesical angle. Her uterus is normal size. An IUD is in place. No adnexal masses are appreciated.  Procedure:  The patient was taken to the operating room where a general anesthetic was given. The patient's abdomen was prepped with ChloraPrep. The perineum and vagina were prepped with multiple layers of Betadine. A three-way Foley catheter was placed in the bladder. The patient was sterilely draped. The lower abdomen was injected with 10 cc of half percent Marcaine with epinephrine. A low transverse incision was made in the incision was carried sharply through the subcutaneous tissue, and fascia. The abdominal musculature was separated in the midline. The space of Retzius was entered. The fascia to the left and to the right of the urethra was then bluntly dissected. 2 sutures were placed to the right of the urethra and 2 sutures were placed to the left of the urethra. The most cephalad suture was placed at the level of the urethrovesical junction. Care  was taken not to in her the bladder. 0 PDS was the suture material used. The bladder was filled with sterile milk. There was no leakage of milk into the operative field. There was no evidence of damage to the vital structures. The space of Retzius was then irrigated. The sutures were then attached to Cooper's ligament. The assistant then put a hand in the vagina. The urethra was elevated. The sutures were tied. Care was given not to tie the sutures too tightly. The assistant then changed gloves. The abdominal suture was then reapproximated in the midline. The fascia was closed using a running suture of 0 Vicryl followed by 3 interrupted sutures of 0 Vicryl. The subcutaneous layer was irrigated. The subcutaneous air was closed using interrupted sutures of 0 chromic. The skin was reapproximated using a subcuticular suture of 3-0 Monocryl. We then turned our attention to the vaginal portion of our procedure. A weighted speculum was placed in the posterior vagina. The anterior vagina was injected with 10 cc of half percent Marcaine with epinephrine. An incision was made in the vaginal mucosa. The vaginal mucosa was then she sharply and bluntly dissected from the fascia that supports the bladder. The fascia was reapproximated and reinforced in the midline using 0 chromic catgut. A layer of sutures of 0 Vicryl was also placed. The excess vaginal mucosa was sharply excised. The vaginal mucosa was closed using interrupted sutures of 0 Vicryl. The posterior vagina was then injected with 10 cc of half percent Marcaine with epinephrine. An incision was made in the midline of the posterior vagina. The vaginal mucosa was sharply and bluntly dissected from the underlying fascia. The  fascia was reapproximated in the midline using interrupted sutures of 0 chromic catgut and 0 Vicryl. The perineal body was reinforced at the introitus. The excess vaginal mucosa was excised. The vaginal mucosa was then closed using a running suture of  0 Vicryl. The skin of the perineal body was closed using interrupted sutures of 0 Vicryl. The urethrovesical junction was noted to be a better angle. The vagina was noted to be 2 finger breaths wide. The depth was appropriate. Hemostasis was adequate. The vagina was packed with gauze that was soaked with estrogen vaginal cream. Sponge, needle, and instrument counts were correct on 2 occasions. The estimated blood loss for the procedure was 100 cc. She tolerated her procedure well. She was noted to drain clear yellow urine at the end of our procedure. She was awakened without difficulty and transported to the recovery room in stable condition. There were no pathology specimens.  Gildardo Cranker, M.D.

## 2013-12-23 ENCOUNTER — Encounter (HOSPITAL_COMMUNITY): Payer: Self-pay | Admitting: Obstetrics and Gynecology

## 2013-12-23 LAB — CBC WITH DIFFERENTIAL/PLATELET
Basophils Absolute: 0 10*3/uL (ref 0.0–0.1)
Basophils Relative: 0 % (ref 0–1)
Eosinophils Absolute: 0 10*3/uL (ref 0.0–0.7)
Eosinophils Relative: 0 % (ref 0–5)
HCT: 34.8 % — ABNORMAL LOW (ref 36.0–46.0)
Hemoglobin: 11.6 g/dL — ABNORMAL LOW (ref 12.0–15.0)
Lymphocytes Relative: 16 % (ref 12–46)
Lymphs Abs: 3.4 10*3/uL (ref 0.7–4.0)
MCH: 31.3 pg (ref 26.0–34.0)
MCHC: 33.3 g/dL (ref 30.0–36.0)
MCV: 93.8 fL (ref 78.0–100.0)
Monocytes Absolute: 1.3 10*3/uL — ABNORMAL HIGH (ref 0.1–1.0)
Monocytes Relative: 6 % (ref 3–12)
Neutro Abs: 17.3 10*3/uL — ABNORMAL HIGH (ref 1.7–7.7)
Neutrophils Relative %: 78 % — ABNORMAL HIGH (ref 43–77)
Platelets: 284 10*3/uL (ref 150–400)
RBC: 3.71 MIL/uL — ABNORMAL LOW (ref 3.87–5.11)
RDW: 12.5 % (ref 11.5–15.5)
WBC: 22 10*3/uL — ABNORMAL HIGH (ref 4.0–10.5)

## 2013-12-23 LAB — CBC
HCT: 36.4 % (ref 36.0–46.0)
Hemoglobin: 12.3 g/dL (ref 12.0–15.0)
MCH: 31.1 pg (ref 26.0–34.0)
MCHC: 33.8 g/dL (ref 30.0–36.0)
MCV: 92.2 fL (ref 78.0–100.0)
Platelets: 308 10*3/uL (ref 150–400)
RBC: 3.95 MIL/uL (ref 3.87–5.11)
RDW: 12.2 % (ref 11.5–15.5)
WBC: 19.5 10*3/uL — ABNORMAL HIGH (ref 4.0–10.5)

## 2013-12-23 MED ORDER — OXYCODONE-ACETAMINOPHEN 5-325 MG PO TABS: 1.0000 | ORAL_TABLET | Freq: Four times a day (QID) | ORAL | Status: DC | PRN

## 2013-12-23 MED ORDER — IBUPROFEN 600 MG PO TABS: ORAL_TABLET | ORAL | Status: DC

## 2013-12-23 MED ORDER — ONDANSETRON HCL 4 MG PO TABS: 4.0000 mg | ORAL_TABLET | Freq: Three times a day (TID) | ORAL | Status: DC | PRN

## 2013-12-23 MED ORDER — ESTRADIOL 0.1 MG/GM VA CREA: TOPICAL_CREAM | VAGINAL | Status: DC

## 2013-12-23 MED ORDER — DOCUSATE SODIUM 100 MG PO CAPS
100.0000 mg | ORAL_CAPSULE | Freq: Every day | ORAL | Status: DC
  Administered 2013-12-23 – 2013-12-24 (×2): 100 mg via ORAL
  Filled 2013-12-23 (×2): qty 1

## 2013-12-23 NOTE — Anesthesia Postprocedure Evaluation (Signed)
  Anesthesia Post-op Note  Patient: Katelyn Reyes  Procedure(s) Performed: Procedure(s): BURCH CYSTO URETHROPEXY, ANTERIOR AND POSTERIOR CULPORRHAPHY (N/A)  Patient Location: [facility]  Anesthesia Type:General  Level of Consciousness: awake, alert  and oriented  Airway and Oxygen Therapy: Patient Spontanous Breathing  Post-op Pain: mild  Post-op Assessment: Patient's Cardiovascular Status Stable, Respiratory Function Stable, Patent Airway, NAUSEA AND VOMITING PRESENT, Adequate PO intake and Pain level controlled  Post-op Vital Signs: Reviewed and stable  Last Vitals:  Filed Vitals:   12/23/13 0555  BP: 94/45  Pulse: 86  Temp: 36.6 C  Resp: 17    Complications: No apparent anesthesia complications

## 2013-12-23 NOTE — Progress Notes (Signed)
Pt attempted to void x2. Unable to void at 0900, first I&O cath volume 600 cc's... Pt attempted to void again at 1045, and was unable... Bladder scan volume 547... Pt requesting for foley catheter to be reinserted... Dr. Raphael Gibney notified, and stated to replace foley, and that he would keep patient overnight, and remove foley in am. Pt informed of plan of care... Will continue to monitor.

## 2013-12-23 NOTE — Discharge Summary (Signed)
Physician Discharge Summary  Patient ID: Katelyn Reyes MRN: [id number] DOB/AGE: 12/28/1960 53 y.o.  Admit date: 12/22/2013 Discharge date: 12/23/2013   Discharge Diagnoses:  Urinary Incontinence and Pelvic Relaxation Active Problems:   Urinary incontinence in female   Operation: Burch Cystourethropexy and Anterior-Posterior Colporrhaphy   Discharged Condition: Good  Hospital Course: On the date of admission the patient underwent the aforementioned procedures and tolerated them well,  Post operative course was unremarkable with the patient resuming bowel and bladder function by post operative day #1 and was therefore deemed ready for discharge home.  Discharge hemoglobin was 12.3  Disposition: 01-Home or Self Care  Discharge Medications:    Medication List         albuterol 108 (90 BASE) MCG/ACT inhaler  Commonly known as:  PROVENTIL HFA;VENTOLIN HFA  Inhale 2 puffs into the lungs every 6 (six) hours as needed for wheezing.     ALPRAZolam 0.5 MG tablet  Commonly known as:  XANAX  Take 0.25 mg by mouth at bedtime as needed for sleep.     Biotin 10 MG Caps  Take 1 capsule by mouth daily.     cetirizine 10 MG tablet  Commonly known as:  ZYRTEC  Take 1 tablet (10 mg total) by mouth daily.     ibuprofen 600 MG tablet  Commonly known as:  ADVIL,MOTRIN  1  po  pc every 6 hours for 5 days then prn     levonorgestrel 20 MCG/24HR IUD  Commonly known as:  MIRENA  1 each by Intrauterine route once.     mometasone 50 MCG/ACT nasal spray  Commonly known as:  NASONEX  Place 2 sprays into the nose daily.     montelukast 10 MG tablet  Commonly known as:  SINGULAIR  Take 1 tablet (10 mg total) by mouth daily.     multivitamin with minerals Tabs tablet  Take 1 tablet by mouth daily.     ondansetron 4 MG tablet  Commonly known as:  ZOFRAN  Take 1 tablet (4 mg total) by mouth every 8 (eight) hours as needed for nausea or vomiting.     oxyCODONE-acetaminophen 5-325 MG per  tablet  Commonly known as:  PERCOCET/ROXICET  Take 1-2 tablets by mouth every 6 (six) hours as needed for severe pain (moderate to severe pain (when tolerating fluids)).     sertraline 100 MG tablet  Commonly known as:  ZOLOFT  Take 1 tablet (100 mg total) by mouth daily.          Follow-up: Dr. [name] on January 31, 2014 at 1:30 p.m.   SignedEarnstine Regal, PA-C 12/23/2013, 7:23 AM

## 2013-12-23 NOTE — Progress Notes (Addendum)
Katelyn Reyes is a63 y.o.  [id number]  Post Op Date # 1:  Burch Procedure/ A-P Repair  Subjective: Patient is Doing well postoperatively. Patient has Pain is controlled with current analgesics. Medications being used: prescription NSAID's including Ibuprofen and narcotic analgesics including Percocet.., tolerating a regular diet and ambulating in the halls without difficulty. Hasn't voided yet.  Objective: Vital signs in last 24 hours: Temp:  [97.5 F (36.4 C)-98.1 F (36.7 C)] 97.9 F (36.6 C) (07/10 0555) Pulse Rate:  [68-91] 86 (07/10 0555) Resp:  [12-20] 17 (07/10 0555) BP: (86-123)/(43-82) 94/45 mmHg (07/10 0555) SpO2:  [93 %-100 %] 95 % (07/10 0555) Weight:  [207 lb (93.895 kg)] 207 lb (93.895 kg) (07/09 0859)  Intake/Output from previous day: 07/09 0701 - 07/10 0700 In: 2950 [P.O.:580; I.V.:2370] Out: 3975 [Urine:3875] Intake/Output this shift:    Recent Labs Lab 12/22/13 0920 12/23/13 0512  WBC 7.5 19.5*  HGB 12.4 12.3  HCT 36.2 36.4  PLT 285 308    No results found for this basename: NA, K, CL, CO2, BUN, CREATININE, CALCIUM, LABALBU, PROT, BILITOT, ALKPHOS, ALT, AST, GLUCOSE,  in the last 168 hours  EXAM: General: alert, cooperative and no distress Resp: clear to auscultation bilaterally Cardio: regular rate and rhythm, S1, S2 normal, no murmur, click, rub or gallop GI: Soft, non-tender and dressing clean, dry and intact. Extremities: Homans sign is negative, no sign of DVT   Assessment: s/p Procedure(s): BURCH CYSTO URETHROPEXY, ANTERIOR AND POSTERIOR CULPORRHAPHY: stable and progressing well  Plan: Begin voiding trial and if successful and post void residual less than 75 cc will discharge home.  LOS: 1 day    POWELL,ELMIRA, PA-C 12/23/2013 7:13 AM  Failed voiding trial and just had Foley inserted In Good spirits Pain is well managed Will repeat trial of voiding in am

## 2013-12-24 LAB — URINALYSIS, ROUTINE W REFLEX MICROSCOPIC
Bilirubin Urine: NEGATIVE
Glucose, UA: NEGATIVE mg/dL
Ketones, ur: NEGATIVE mg/dL
Leukocytes, UA: NEGATIVE
Nitrite: NEGATIVE
Protein, ur: NEGATIVE mg/dL
Specific Gravity, Urine: 1.01 (ref 1.005–1.030)
Urobilinogen, UA: 0.2 mg/dL (ref 0.0–1.0)
pH: 5.5 (ref 5.0–8.0)

## 2013-12-24 LAB — CBC WITH DIFFERENTIAL/PLATELET
Basophils Absolute: 0 10*3/uL (ref 0.0–0.1)
Basophils Relative: 0 % (ref 0–1)
Eosinophils Absolute: 0.1 10*3/uL (ref 0.0–0.7)
Eosinophils Relative: 0 % (ref 0–5)
HCT: 35.9 % — ABNORMAL LOW (ref 36.0–46.0)
Hemoglobin: 11.6 g/dL — ABNORMAL LOW (ref 12.0–15.0)
Lymphocytes Relative: 38 % (ref 12–46)
Lymphs Abs: 5.2 10*3/uL — ABNORMAL HIGH (ref 0.7–4.0)
MCH: 30.5 pg (ref 26.0–34.0)
MCHC: 32.3 g/dL (ref 30.0–36.0)
MCV: 94.5 fL (ref 78.0–100.0)
Monocytes Absolute: 0.7 10*3/uL (ref 0.1–1.0)
Monocytes Relative: 5 % (ref 3–12)
Neutro Abs: 7.9 10*3/uL — ABNORMAL HIGH (ref 1.7–7.7)
Neutrophils Relative %: 57 % (ref 43–77)
Platelets: 280 10*3/uL (ref 150–400)
RBC: 3.8 MIL/uL — ABNORMAL LOW (ref 3.87–5.11)
RDW: 12.5 % (ref 11.5–15.5)
WBC: 14 10*3/uL — ABNORMAL HIGH (ref 4.0–10.5)

## 2013-12-24 LAB — URINE MICROSCOPIC-ADD ON

## 2013-12-24 MED ORDER — PHENAZOPYRIDINE HCL 200 MG PO TABS: 200.0000 mg | ORAL_TABLET | Freq: Three times a day (TID) | ORAL | Status: DC | PRN

## 2013-12-24 MED ORDER — NITROFURANTOIN MONOHYD MACRO 100 MG PO CAPS
100.0000 mg | ORAL_CAPSULE | Freq: Two times a day (BID) | ORAL | Status: DC
  Administered 2013-12-24 – 2013-12-25 (×3): 100 mg via ORAL
  Filled 2013-12-24 (×3): qty 1

## 2013-12-24 MED ORDER — PHENAZOPYRIDINE HCL 100 MG PO TABS
200.0000 mg | ORAL_TABLET | Freq: Three times a day (TID) | ORAL | Status: DC
  Administered 2013-12-24 – 2013-12-25 (×4): 200 mg via ORAL
  Filled 2013-12-24 (×3): qty 2

## 2013-12-24 MED ORDER — NITROFURANTOIN MONOHYD MACRO 100 MG PO CAPS: 100.0000 mg | ORAL_CAPSULE | Freq: Two times a day (BID) | ORAL | Status: DC

## 2013-12-24 NOTE — Discharge Instructions (Signed)
Call [facility] @ [phone] if:  You have a temperature greater than or equal to 100.4 degrees Farenheit orally You have pain that is not made better by the pain medication given and taken as directed You have excessive vaginal  bleeding or problems urinating  Take Colace (Docusate Sodium/Stool Softener) 100 mg 2-3 times daily while taking narcotic pain medicine to avoid constipation or until bowel movements are regular. Place 1 gram of Estrace Vaginal Cream in vagina twice a week for 6 weeks (may use finger to place in lieu of applicator)  then as directed thereafter  You may drive after 1 week You may walk up steps You may shower tomorrow You may resume a regular diet Keep incisions clean and dry Do not lift over 15 pounds for 6 weeks Avoid anything in vagina for 6 weeks (or until after your post-operative visit)  Keep follow up appointment with Dr. Raphael Gibney on January 31, 2014 at 1:30 p.m.  The office will call you to schedule an appointment with Dr Raphael Gibney on Tuesday for a voiding trial. Please clamp your Foley catheter 2 hours prior to your appointment

## 2013-12-24 NOTE — Discharge Summary (Signed)
  Physician Discharge Summary  Patient ID: Katelyn Reyes MRN: [id number] DOB/AGE: 51-Dec-1962 51 y.o.  Admit date: 12/22/2013 Discharge date: 12/24/2013  Admission Diagnoses: Stress Urinary Incontinence  Discharge Diagnoses: Stress Urinary Incontinence        Active Problems:   Urinary incontinence in female   Discharged Condition: good but with urinary retention. Discharged with a Foley catheter to return in 3 days for voiding trial.  Treatments: surgery: Anterior and posterior repair with Burch procedure  Disposition: 01-Home or Self Care     Medication List         albuterol 108 (90 BASE) MCG/ACT inhaler  Commonly known as:  PROVENTIL HFA;VENTOLIN HFA  Inhale 2 puffs into the lungs every 6 (six) hours as needed for wheezing.     ALPRAZolam 0.5 MG tablet  Commonly known as:  XANAX  Take 0.25 mg by mouth at bedtime as needed for sleep.     Biotin 10 MG Caps  Take 1 capsule by mouth daily.     cetirizine 10 MG tablet  Commonly known as:  ZYRTEC  Take 1 tablet (10 mg total) by mouth daily.     estradiol 0.1 MG/GM vaginal cream  Commonly known as:  ESTRACE  1  gram in vagina twice a week for 6 weeks then as directed     ibuprofen 600 MG tablet  Commonly known as:  ADVIL,MOTRIN  1  po  pc every 6 hours for 5 days then prn     levonorgestrel 20 MCG/24HR IUD  Commonly known as:  MIRENA  1 each by Intrauterine route once.     mometasone 50 MCG/ACT nasal spray  Commonly known as:  NASONEX  Place 2 sprays into the nose daily.     montelukast 10 MG tablet  Commonly known as:  SINGULAIR  Take 1 tablet (10 mg total) by mouth daily.     multivitamin with minerals Tabs tablet  Take 1 tablet by mouth daily.     nitrofurantoin (macrocrystal-monohydrate) 100 MG capsule  Commonly known as:  MACROBID  Take 1 capsule (100 mg total) by mouth 2 (two) times daily.     ondansetron 4 MG tablet  Commonly known as:  ZOFRAN  Take 1 tablet (4 mg total) by mouth every 8  (eight) hours as needed for nausea or vomiting.     oxyCODONE-acetaminophen 5-325 MG per tablet  Commonly known as:  PERCOCET/ROXICET  Take 1-2 tablets by mouth every 6 (six) hours as needed for severe pain (moderate to severe pain (when tolerating fluids)).     phenazopyridine 200 MG tablet  Commonly known as:  PYRIDIUM  Take 1 tablet (200 mg total) by mouth 3 (three) times daily as needed for pain.     sertraline 100 MG tablet  Commonly known as:  ZOLOFT  Take 1 tablet (100 mg total) by mouth daily.           Follow-up Information   Follow up with [name], MD On 01/31/2014. (Appointment time 1:30 p.m.)    Specialty:  Obstetrics and Gynecology   Contact information:   [address] [place] [address] [phone]       Signed: Alwyn Pea, MD 12/24/2013, 3:05 PM

## 2013-12-24 NOTE — Progress Notes (Signed)
2 Days Post-Op Procedure(s) (LRB): BURCH CYSTO URETHROPEXY, ANTERIOR AND POSTERIOR CULPORRHAPHY (N/A)  Subjective: Patient reports that pain is well managed but feeling miserable with bladder spasms. Spasms have improved since Foley was reinserted. A little bit discouraged. Tolerating normal  diet without difficulty. No nausea / vomiting.  Ambulating and passing gas. Failed 2nd voiding trial after 24 hour bladder rest.  Objective: BP 120/78  Pulse 83  Temp(Src) 97.7 F (36.5 C) (Oral)  Resp 18  Ht 5\' 2"  (1.575 m)  Wt 207 lb (93.895 kg)  BMI 37.85 kg/m2  SpO2 97% Abdomen:soft and appropriately tender Extremities: Homans sign is negative, no sign of DVT Incision: covered  Assessment: s/p Procedure(s): BURCH CYSTO URETHROPEXY, ANTERIOR AND POSTERIOR CULPORRHAPHY: urinary retention  Plan: Discharge home in the morning per patient preference with leg bag/ Will add Pyridium for comfort and Macrobid prophylaxis Will plan follow-up in the office on Tuesday 12/27/13 with Dr Raphael Gibney for Foley removal and voiding trial.  LOS: 2 days    Montie Swiderski A 12/24/2013, 2:56 PM

## 2013-12-24 NOTE — Progress Notes (Signed)
Patient voided 30cc @ 0930 with 13cc residual.  At 1130 patient c/o "feeling like I'm having bladder spasms and cannot void."  Per patient request foley catheter reinserted with immediate return of 400cc clear, yellow urine.  MD notified and will be in to assess patient.

## 2013-12-25 LAB — URINE CULTURE
Colony Count: NO GROWTH
Culture: NO GROWTH
Special Requests: NORMAL

## 2013-12-25 MED ORDER — NITROFURANTOIN MONOHYD MACRO 100 MG PO CAPS: 100.0000 mg | ORAL_CAPSULE | Freq: Two times a day (BID) | ORAL | Status: DC

## 2013-12-25 MED ORDER — PHENAZOPYRIDINE HCL 200 MG PO TABS: 200.0000 mg | ORAL_TABLET | Freq: Three times a day (TID) | ORAL | Status: DC

## 2013-12-25 NOTE — Progress Notes (Signed)
Discharge instructions reviewed with patient and mother.  Both state understanding of home care, activity, medications, signs/symptoms to report to MD and return MD office visit.  No home equipment needed and patients mother will assist with care @ home.  Patient discharged in stable condition condition via wheelchair with staff without incident.

## 2014-01-24 ENCOUNTER — Other Ambulatory Visit (HOSPITAL_COMMUNITY): Payer: Self-pay | Admitting: Obstetrics and Gynecology

## 2014-01-24 ENCOUNTER — Telehealth: Payer: Self-pay

## 2014-01-24 DIAGNOSIS — Z1231 Encounter for screening mammogram for malignant neoplasm of breast: Secondary | ICD-10-CM

## 2014-01-24 NOTE — Telephone Encounter (Signed)
Caller name:Madgie Relation to pt: Call back number:[phone] Pharmacy:  Reason for call:  Magaly called to make an appointment for her CPE, we did not have anything until 04/25/2014 8:30am so she is asking if we can call in meds to get her by until then

## 2014-01-25 ENCOUNTER — Ambulatory Visit (HOSPITAL_COMMUNITY)
Admission: RE | Admit: 2014-01-25 | Discharge: 2014-01-25 | Disposition: A | Discharge status: Home / Self Care | Payer: 59 | Source: Ambulatory Visit | Attending: Obstetrics and Gynecology | Admitting: Obstetrics and Gynecology

## 2014-01-25 DIAGNOSIS — Z1231 Encounter for screening mammogram for malignant neoplasm of breast: Secondary | ICD-10-CM | POA: Insufficient documentation

## 2014-01-25 MED ORDER — SERTRALINE HCL 100 MG PO TABS: 100.0000 mg | ORAL_TABLET | Freq: Every day | ORAL | Status: DC

## 2014-01-25 MED ORDER — CETIRIZINE HCL 10 MG PO TABS: 10.0000 mg | ORAL_TABLET | Freq: Every day | ORAL | Status: DC

## 2014-01-25 MED ORDER — MOMETASONE FUROATE 50 MCG/ACT NA SUSP: 2.0000 | Freq: Every day | NASAL | Status: DC

## 2014-01-25 MED ORDER — MONTELUKAST SODIUM 10 MG PO TABS: 10.0000 mg | ORAL_TABLET | Freq: Every day | ORAL | Status: DC

## 2014-01-25 NOTE — Telephone Encounter (Signed)
Pt has not been seen since October 2014. Do you want me to have pt schedule a follow up appt for Cholesterol?

## 2014-01-25 NOTE — Telephone Encounter (Signed)
Pt would like a refill on the following medications:  Zyrtec Zoloft Singulair Flonase  Meds refills for 3 months.  Pt is aware.    Pt as placed on appointment waiting list per her request.

## 2014-01-25 NOTE — Telephone Encounter (Signed)
Ok to schedule for Nov CPE- can refill meds until then

## 2014-01-25 NOTE — Telephone Encounter (Signed)
Can you please contact pt to find out what medications she would like?

## 2014-04-25 ENCOUNTER — Ambulatory Visit (INDEPENDENT_AMBULATORY_CARE_PROVIDER_SITE_OTHER): Payer: 59 | Admitting: Family Medicine

## 2014-04-25 ENCOUNTER — Encounter: Payer: Self-pay | Admitting: Family Medicine

## 2014-04-25 VITALS — BP 112/78 | HR 73 | Temp 98.0°F | Resp 16 | Ht 62.5 in | Wt 201.1 lb

## 2014-04-25 DIAGNOSIS — Z0000 Encounter for general adult medical examination without abnormal findings: Secondary | ICD-10-CM

## 2014-04-25 LAB — CBC WITH DIFFERENTIAL/PLATELET
Basophils Absolute: 0 10*3/uL (ref 0.0–0.1)
Basophils Relative: 0.4 % (ref 0.0–3.0)
Eosinophils Absolute: 0.2 10*3/uL (ref 0.0–0.7)
Eosinophils Relative: 2.5 % (ref 0.0–5.0)
HCT: 38.1 % (ref 36.0–46.0)
Hemoglobin: 12.4 g/dL (ref 12.0–15.0)
Lymphocytes Relative: 42.8 % (ref 12.0–46.0)
Lymphs Abs: 2.9 10*3/uL (ref 0.7–4.0)
MCHC: 32.6 g/dL (ref 30.0–36.0)
MCV: 94.4 fl (ref 78.0–100.0)
Monocytes Absolute: 0.4 10*3/uL (ref 0.1–1.0)
Monocytes Relative: 6 % (ref 3.0–12.0)
Neutro Abs: 3.3 10*3/uL (ref 1.4–7.7)
Neutrophils Relative %: 48.3 % (ref 43.0–77.0)
Platelets: 305 10*3/uL (ref 150.0–400.0)
RBC: 4.03 Mil/uL (ref 3.87–5.11)
RDW: 13 % (ref 11.5–15.5)
WBC: 6.8 10*3/uL (ref 4.0–10.5)

## 2014-04-25 LAB — HEPATIC FUNCTION PANEL
ALT: 22 U/L (ref 0–35)
AST: 24 U/L (ref 0–37)
Albumin: 3.4 g/dL — ABNORMAL LOW (ref 3.5–5.2)
Alkaline Phosphatase: 62 U/L (ref 39–117)
Bilirubin, Direct: 0 mg/dL (ref 0.0–0.3)
Total Bilirubin: 0.7 mg/dL (ref 0.2–1.2)
Total Protein: 7.3 g/dL (ref 6.0–8.3)

## 2014-04-25 LAB — BASIC METABOLIC PANEL
BUN: 9 mg/dL (ref 6–23)
CO2: 28 mEq/L (ref 19–32)
Calcium: 8.9 mg/dL (ref 8.4–10.5)
Chloride: 105 mEq/L (ref 96–112)
Creatinine, Ser: 0.9 mg/dL (ref 0.4–1.2)
GFR: 82.8 mL/min (ref >60.00)
Glucose, Bld: 100 mg/dL — ABNORMAL HIGH (ref 70–99)
Potassium: 4.2 mEq/L (ref 3.5–5.1)
Sodium: 138 mEq/L (ref 135–145)

## 2014-04-25 LAB — LIPID PANEL
Cholesterol: 171 mg/dL (ref 0–200)
HDL: 48.2 mg/dL (ref >39.00)
LDL Cholesterol: 103 mg/dL — ABNORMAL HIGH (ref 0–99)
NonHDL: 122.8
Total CHOL/HDL Ratio: 4
Triglycerides: 97 mg/dL (ref 0.0–149.0)
VLDL: 19.4 mg/dL (ref 0.0–40.0)

## 2014-04-25 LAB — TSH: TSH: 2 u[IU]/mL (ref 0.35–4.50)

## 2014-04-25 LAB — VITAMIN D 25 HYDROXY (VIT D DEFICIENCY, FRACTURES): VITD: 16.96 ng/mL — ABNORMAL LOW (ref 30.00–100.00)

## 2014-04-25 MED ORDER — MOMETASONE FUROATE 50 MCG/ACT NA SUSP: 2.0000 | Freq: Every day | NASAL | Status: DC

## 2014-04-25 MED ORDER — CETIRIZINE HCL 10 MG PO TABS: 10.0000 mg | ORAL_TABLET | Freq: Every day | ORAL | Status: DC

## 2014-04-25 MED ORDER — MONTELUKAST SODIUM 10 MG PO TABS: 10.0000 mg | ORAL_TABLET | Freq: Every day | ORAL | Status: DC

## 2014-04-25 MED ORDER — SERTRALINE HCL 100 MG PO TABS: 100.0000 mg | ORAL_TABLET | Freq: Every day | ORAL | Status: DC

## 2014-04-25 MED ORDER — ZOLPIDEM TARTRATE 10 MG PO TABS
10.0000 mg | ORAL_TABLET | Freq: Every evening | ORAL | Status: DC | PRN
Start: 2014-04-25 — End: 2014-11-02

## 2014-04-25 MED ORDER — ALPRAZOLAM 0.5 MG PO TABS: 0.5000 mg | ORAL_TABLET | Freq: Three times a day (TID) | ORAL | Status: DC | PRN

## 2014-04-25 MED ORDER — ALBUTEROL SULFATE HFA 108 (90 BASE) MCG/ACT IN AERS: 2.0000 | INHALATION_SPRAY | Freq: Four times a day (QID) | RESPIRATORY_TRACT | Status: DC | PRN

## 2014-04-25 NOTE — Assessment & Plan Note (Signed)
Pt's PE WNL w/ exception of weight.  Pt is working on this and is losing weight.  UTD on GYN and colonoscopy.  Check labs.  Med refills provided.  Anticipatory guidance provided.

## 2014-04-25 NOTE — Progress Notes (Signed)
   Subjective:    Patient ID: Katelyn Reyes, female    DOB: [birth year], 53 y.o.   MRN: [id number]  HPI CPE- UTD on GYN Raphael Gibney- May 2015).  UTD on mammo.  UTD on colonoscopy (Dr Collene Mares, Feb 2012).   Review of Systems Patient reports no vision/ hearing changes, adenopathy,fever, weight change,  persistant/recurrent hoarseness , swallowing issues, chest pain, palpitations, edema, persistant/recurrent cough, hemoptysis, dyspnea (rest/exertional/paroxysmal nocturnal), gastrointestinal bleeding (melena, rectal bleeding), abdominal pain, significant heartburn, bowel changes, GU symptoms (dysuria, hematuria, incontinence), Gyn symptoms (abnormal  bleeding, pain),  syncope, focal weakness, memory loss, numbness & tingling, skin/hair/nail changes, abnormal bruising or bleeding, anxiety, or depression.     Objective:   Physical Exam General Appearance:    Alert, cooperative, no distress, appears stated age  Head:    Normocephalic, without obvious abnormality, atraumatic  Eyes:    PERRL, conjunctiva/corneas clear, EOM's intact, fundi    benign, both eyes  Ears:    Normal TM's and external ear canals, both ears  Nose:   Nares normal, septum midline, mucosa normal, no drainage    or sinus tenderness  Throat:   Lips, mucosa, and tongue normal; teeth and gums normal  Neck:   Supple, symmetrical, trachea midline, no adenopathy;    Thyroid: no enlargement/tenderness/nodules  Back:     Symmetric, no curvature, ROM normal, no CVA tenderness  Lungs:     Clear to auscultation bilaterally, respirations unlabored  Chest Wall:    No tenderness or deformity   Heart:    Regular rate and rhythm, S1 and S2 normal, no murmur, rub   or gallop  Breast Exam:    Deferred to GYN  Abdomen:     Soft, non-tender, bowel sounds active all four quadrants,    no masses, no organomegaly  Genitalia:    Deferred to GYN  Rectal:    Extremities:   Extremities normal, atraumatic, no cyanosis or edema  Pulses:   2+ and  symmetric all extremities  Skin:   Skin color, texture, turgor normal, no rashes or lesions  Lymph nodes:   Cervical, supraclavicular, and axillary nodes normal  Neurologic:   CNII-XII intact, normal strength, sensation and reflexes    throughout          Assessment & Plan:

## 2014-04-25 NOTE — Patient Instructions (Signed)
Follow up in 1 year or as needed We'll notify you of your lab results and make any changes if needed Keep up the good work on healthy diet and regular exercise Call with any questions or concerns Happy Holidays and Happy Early [name]!!!

## 2014-04-25 NOTE — Progress Notes (Signed)
Pre visit review using our clinic review tool, if applicable. No additional management support is needed unless otherwise documented below in the visit note. 

## 2014-04-26 ENCOUNTER — Other Ambulatory Visit: Payer: Self-pay | Admitting: General Practice

## 2014-04-26 MED ORDER — VITAMIN D (ERGOCALCIFEROL) 1.25 MG (50000 UNIT) PO CAPS: 50000.0000 [IU] | ORAL_CAPSULE | ORAL | Status: DC

## 2014-11-02 ENCOUNTER — Other Ambulatory Visit: Payer: Self-pay | Admitting: Family Medicine

## 2014-11-02 NOTE — Telephone Encounter (Signed)
Last OV 04-25-14 Alprazolam last filled 04-25-14 #60 with 3 Zolpidem last filled 04-25-14 #30 with 3

## 2014-11-02 NOTE — Telephone Encounter (Signed)
Med filled and faxed.  

## 2014-11-10 LAB — HM PAP SMEAR: HM Pap smear: ABNORMAL

## 2014-11-16 ENCOUNTER — Encounter: Payer: Self-pay | Admitting: General Practice

## 2014-12-25 ENCOUNTER — Ambulatory Visit (INDEPENDENT_AMBULATORY_CARE_PROVIDER_SITE_OTHER): Payer: 59 | Admitting: Podiatry

## 2014-12-25 ENCOUNTER — Ambulatory Visit (INDEPENDENT_AMBULATORY_CARE_PROVIDER_SITE_OTHER): Payer: 59

## 2014-12-25 ENCOUNTER — Encounter: Payer: Self-pay | Admitting: Podiatry

## 2014-12-25 VITALS — BP 111/76 | HR 74 | Resp 12

## 2014-12-25 DIAGNOSIS — M722 Plantar fascial fibromatosis: Secondary | ICD-10-CM

## 2014-12-25 DIAGNOSIS — M2010 Hallux valgus (acquired), unspecified foot: Secondary | ICD-10-CM | POA: Diagnosis not present

## 2014-12-25 MED ORDER — TRIAMCINOLONE ACETONIDE 10 MG/ML IJ SUSP
10.0000 mg | Freq: Once | INTRAMUSCULAR | Status: AC
  Administered 2014-12-25: 10 mg

## 2014-12-25 MED ORDER — MELOXICAM 15 MG PO TABS: 15.0000 mg | ORAL_TABLET | Freq: Every day | ORAL | Status: DC

## 2014-12-25 NOTE — Patient Instructions (Signed)
Plantar Fasciitis  Plantar fasciitis is a common condition that causes foot pain. It is soreness (inflammation) of the band of tough fibrous tissue on the bottom of the foot that runs from the heel bone (calcaneus) to the ball of the foot. The cause of this soreness may be from excessive standing, poor fitting shoes, running on hard surfaces, being overweight, having an abnormal walk, or overuse (this is common in runners) of the painful foot or feet. It is also common in aerobic exercise dancers and ballet dancers.  SYMPTOMS   Most people with plantar fasciitis complain of:   Severe pain in the morning on the bottom of their foot especially when taking the first steps out of bed. This pain recedes after a few minutes of walking.   Severe pain is experienced also during walking following a long period of inactivity.   Pain is worse when walking barefoot or up stairs  DIAGNOSIS    Your caregiver will diagnose this condition by examining and feeling your foot.   Special tests such as X-rays of your foot, are usually not needed.  PREVENTION    Consult a sports medicine professional before beginning a new exercise program.   Walking programs offer a good workout. With walking there is a lower chance of overuse injuries common to runners. There is less impact and less jarring of the joints.   Begin all new exercise programs slowly. If problems or pain develop, decrease the amount of time or distance until you are at a comfortable level.   Wear good shoes and replace them regularly.   Stretch your foot and the heel cords at the back of the ankle (Achilles tendon) both before and after exercise.   Run or exercise on even surfaces that are not hard. For example, asphalt is better than pavement.   Do not run barefoot on hard surfaces.   If using a treadmill, vary the incline.   Do not continue to workout if you have foot or joint problems. Seek professional help if they do not improve.  HOME CARE INSTRUCTIONS     Avoid activities that cause you pain until you recover.   Use ice or cold packs on the problem or painful areas after working out.   Only take over-the-counter or prescription medicines for pain, discomfort, or fever as directed by your caregiver.   Soft shoe inserts or athletic shoes with air or gel sole cushions may be helpful.   If problems continue or become more severe, consult a sports medicine caregiver or your own health care provider. Cortisone is a potent anti-inflammatory medication that may be injected into the painful area. You can discuss this treatment with your caregiver.  MAKE SURE YOU:    Understand these instructions.   Will watch your condition.   Will get help right away if you are not doing well or get worse.  Document Released: 02/25/2001 Document Revised: 08/25/2011 Document Reviewed: 04/26/2008  Exit[employer] Patient Information 2015 [employer]. This information is not intended to replace advice given to you by your health care provider. Make sure you discuss any questions you have with your health care provider.

## 2014-12-25 NOTE — Progress Notes (Signed)
   Subjective:    Patient ID: Katelyn Reyes, female    DOB: [birth year], 54 y.o.   MRN: [id number]  HPI PT STATED RT FOOT HEEL PAINFUL FOR 4 MONTHS AND B/L BUNION BEEN PAINFUL FOR 1 YEAR. B/L FEET ARE GETTING WORSE AND ESPECIALLY WHEN GET UP FIRST THING IN THE MORNING. TRIED ICE AND IBUPROFEN.    Review of Systems  Musculoskeletal: Positive for joint swelling.  All other systems reviewed and are negative.      Objective:   Physical Exam        Assessment & Plan:

## 2014-12-26 ENCOUNTER — Ambulatory Visit: Payer: 59 | Admitting: Podiatry

## 2014-12-26 NOTE — Progress Notes (Signed)
Subjective:     Patient ID: Katelyn Reyes, female   DOB: Mar 28, 1961, 54 y.o.   MRN: [id number]  HPI patient presents stating I have had heel pain for 4 months right heel and my bunions been bother me for around a year making it difficult to wear certain types shoes. Patient states that the heel has gotten worse and it's increasingly painful with ambulation   Review of Systems  All other systems reviewed and are negative.      Objective:   Physical Exam  Constitutional: She is oriented to person, place, and time.  Cardiovascular: Intact distal pulses.   Musculoskeletal: Normal range of motion.  Neurological: She is oriented to person, place, and time.  Skin: Skin is warm.  Nursing note and vitals reviewed.  neurovascular status intact muscle strength adequate with range of motion within normal limits. Patient's noted to have structural hyperostosis medial aspect first metatarsal head bilateral with more pain right over left but more deformity left over right. Exquisite discomfort plantar aspect right heel at the insertional point of the tendon into the calcaneus     Assessment:     Structural HAV deformity bilateral with pain and structural plantar fasciitis right with depression of the arch a factor in the condition    Plan:     Reviewed x-rays and discussed condition. Today I went ahead and injected the right plantar fascia 3 mg Kenalog 5 mg Xylocaine and applied fascial brace. Gave instructions on physical therapy and reappoint to recheck and to discuss bunions and also to discuss long-term orthotic therapy

## 2014-12-29 ENCOUNTER — Other Ambulatory Visit (HOSPITAL_COMMUNITY): Payer: Self-pay | Admitting: Obstetrics and Gynecology

## 2014-12-29 DIAGNOSIS — Z1231 Encounter for screening mammogram for malignant neoplasm of breast: Secondary | ICD-10-CM

## 2015-01-22 ENCOUNTER — Ambulatory Visit (INDEPENDENT_AMBULATORY_CARE_PROVIDER_SITE_OTHER): Payer: 59 | Admitting: Podiatry

## 2015-01-22 ENCOUNTER — Encounter: Payer: Self-pay | Admitting: Podiatry

## 2015-01-22 VITALS — BP 114/81 | HR 75 | Resp 16

## 2015-01-22 DIAGNOSIS — M722 Plantar fascial fibromatosis: Secondary | ICD-10-CM

## 2015-01-22 MED ORDER — TRIAMCINOLONE ACETONIDE 10 MG/ML IJ SUSP
10.0000 mg | Freq: Once | INTRAMUSCULAR | Status: AC
  Administered 2015-01-22: 10 mg

## 2015-01-22 NOTE — Progress Notes (Signed)
Subjective:     Patient ID: Katelyn Reyes, female   DOB: 08/18/1960, 54 y.o.   MRN: [id number]  HPI patient states my heel is somewhat improved but I do have flatfoot deformity something it's bother me for a while   Review of Systems     Objective:   Physical Exam Neurovascular status intact with continued discomfort deep upon palpation to the plantar fascia right with improvement from previous treatment but pain still present    Assessment:     Plantar fasciitis right with inflammation and structural flatfoot deformity noted    Plan:     H&P and condition reviewed. Today I injected the right plantar fascia at the insertion 3 mg Kenalog 5 mg Xylocaine and I went ahead and I scanned for a Berkley type orthotic to lift up the plantar arch

## 2015-01-30 ENCOUNTER — Ambulatory Visit (HOSPITAL_COMMUNITY): Payer: 59

## 2015-01-30 ENCOUNTER — Ambulatory Visit (HOSPITAL_COMMUNITY)
Admission: RE | Admit: 2015-01-30 | Discharge: 2015-01-30 | Disposition: A | Discharge status: Home / Self Care | Payer: 59 | Source: Ambulatory Visit | Attending: Obstetrics and Gynecology | Admitting: Obstetrics and Gynecology

## 2015-01-30 DIAGNOSIS — Z1231 Encounter for screening mammogram for malignant neoplasm of breast: Secondary | ICD-10-CM | POA: Insufficient documentation

## 2015-02-01 ENCOUNTER — Other Ambulatory Visit: Payer: Self-pay | Admitting: Obstetrics and Gynecology

## 2015-02-01 DIAGNOSIS — R928 Other abnormal and inconclusive findings on diagnostic imaging of breast: Secondary | ICD-10-CM

## 2015-02-08 ENCOUNTER — Other Ambulatory Visit: Payer: Self-pay | Admitting: Obstetrics and Gynecology

## 2015-02-08 ENCOUNTER — Ambulatory Visit
Admission: RE | Admit: 2015-02-08 | Discharge: 2015-02-08 | Disposition: A | Discharge status: Home / Self Care | Payer: 59 | Source: Ambulatory Visit | Attending: Obstetrics and Gynecology | Admitting: Obstetrics and Gynecology

## 2015-02-08 ENCOUNTER — Other Ambulatory Visit: Payer: Self-pay | Admitting: Podiatry

## 2015-02-08 DIAGNOSIS — R921 Mammographic calcification found on diagnostic imaging of breast: Secondary | ICD-10-CM

## 2015-02-08 DIAGNOSIS — R928 Other abnormal and inconclusive findings on diagnostic imaging of breast: Secondary | ICD-10-CM

## 2015-02-12 ENCOUNTER — Ambulatory Visit (INDEPENDENT_AMBULATORY_CARE_PROVIDER_SITE_OTHER): Payer: 59 | Admitting: Podiatry

## 2015-02-12 ENCOUNTER — Encounter: Payer: Self-pay | Admitting: Podiatry

## 2015-02-12 VITALS — BP 112/73 | HR 72 | Resp 16

## 2015-02-12 DIAGNOSIS — M722 Plantar fascial fibromatosis: Secondary | ICD-10-CM

## 2015-02-12 NOTE — Patient Instructions (Signed)

## 2015-02-13 NOTE — Progress Notes (Signed)
Subjective:     Patient ID: Katelyn Reyes, female   DOB: 1960-08-24, 54 y.o.   MRN: [id number]  HPI patient states the second injection did not help as much as the first one. Obesity is complicating factor with this patient   Review of Systems     Objective:   Physical Exam Neurovascular status intact muscle strength adequate with continued discomfort in the plantar fascia right at the insertional point tendon the calcaneus with depression of the arch noted upon weightbearing    Assessment:     Continue plantar fasciitis right acute in nature with flattening of the arch is part of the process    Plan:     Explained condition to patient and at this time dispensed a night splint with instructions on usage along with heat and ice therapy. Reappoint in 1 month to reevaluate

## 2015-02-14 ENCOUNTER — Other Ambulatory Visit: Payer: Self-pay | Admitting: Obstetrics and Gynecology

## 2015-02-14 ENCOUNTER — Ambulatory Visit: Payer: 59 | Admitting: Podiatry

## 2015-02-14 ENCOUNTER — Ambulatory Visit
Admission: RE | Admit: 2015-02-14 | Discharge: 2015-02-14 | Disposition: A | Discharge status: Home / Self Care | Payer: 59 | Source: Ambulatory Visit | Attending: Obstetrics and Gynecology | Admitting: Obstetrics and Gynecology

## 2015-02-14 DIAGNOSIS — R921 Mammographic calcification found on diagnostic imaging of breast: Secondary | ICD-10-CM

## 2015-06-05 ENCOUNTER — Telehealth: Payer: Self-pay

## 2015-06-05 NOTE — Telephone Encounter (Signed)
Pre Visit call completed. 

## 2015-06-06 ENCOUNTER — Encounter: Payer: Self-pay | Admitting: Family Medicine

## 2015-06-06 ENCOUNTER — Other Ambulatory Visit: Payer: Self-pay | Admitting: General Practice

## 2015-06-06 ENCOUNTER — Ambulatory Visit (INDEPENDENT_AMBULATORY_CARE_PROVIDER_SITE_OTHER): Payer: 59 | Admitting: Family Medicine

## 2015-06-06 ENCOUNTER — Encounter: Payer: Self-pay | Admitting: General Practice

## 2015-06-06 VITALS — BP 116/78 | HR 86 | Temp 98.2°F | Resp 16 | Ht 63.0 in | Wt 201.2 lb

## 2015-06-06 DIAGNOSIS — Z0000 Encounter for general adult medical examination without abnormal findings: Secondary | ICD-10-CM | POA: Diagnosis not present

## 2015-06-06 DIAGNOSIS — J449 Chronic obstructive pulmonary disease, unspecified: Secondary | ICD-10-CM | POA: Diagnosis not present

## 2015-06-06 LAB — HEPATIC FUNCTION PANEL
ALT: 26 U/L (ref 0–35)
AST: 25 U/L (ref 0–37)
Albumin: 4.1 g/dL (ref 3.5–5.2)
Alkaline Phosphatase: 71 U/L (ref 39–117)
Bilirubin, Direct: 0.1 mg/dL (ref 0.0–0.3)
Total Bilirubin: 0.5 mg/dL (ref 0.2–1.2)
Total Protein: 7.7 g/dL (ref 6.0–8.3)

## 2015-06-06 LAB — BASIC METABOLIC PANEL
BUN: 13 mg/dL (ref 6–23)
CO2: 26 mEq/L (ref 19–32)
Calcium: 9.4 mg/dL (ref 8.4–10.5)
Chloride: 105 mEq/L (ref 96–112)
Creatinine, Ser: 0.91 mg/dL (ref 0.40–1.20)
GFR: 83.48 mL/min (ref >60.00)
Glucose, Bld: 107 mg/dL — ABNORMAL HIGH (ref 70–99)
Potassium: 3.9 mEq/L (ref 3.5–5.1)
Sodium: 139 mEq/L (ref 135–145)

## 2015-06-06 LAB — CBC WITH DIFFERENTIAL/PLATELET
Basophils Absolute: 0 10*3/uL (ref 0.0–0.1)
Basophils Relative: 0.5 % (ref 0.0–3.0)
Eosinophils Absolute: 0.2 10*3/uL (ref 0.0–0.7)
Eosinophils Relative: 3.8 % (ref 0.0–5.0)
HCT: 39 % (ref 36.0–46.0)
Hemoglobin: 13.3 g/dL (ref 12.0–15.0)
Lymphocytes Relative: 45.9 % (ref 12.0–46.0)
Lymphs Abs: 2.8 10*3/uL (ref 0.7–4.0)
MCHC: 34.1 g/dL (ref 30.0–36.0)
MCV: 92.7 fl (ref 78.0–100.0)
Monocytes Absolute: 0.4 10*3/uL (ref 0.1–1.0)
Monocytes Relative: 7.3 % (ref 3.0–12.0)
Neutro Abs: 2.6 10*3/uL (ref 1.4–7.7)
Neutrophils Relative %: 42.5 % — ABNORMAL LOW (ref 43.0–77.0)
Platelets: 302 10*3/uL (ref 150.0–400.0)
RBC: 4.21 Mil/uL (ref 3.87–5.11)
RDW: 13.1 % (ref 11.5–15.5)
WBC: 6.1 10*3/uL (ref 4.0–10.5)

## 2015-06-06 LAB — TSH: TSH: 2.55 u[IU]/mL (ref 0.35–4.50)

## 2015-06-06 LAB — LIPID PANEL
Cholesterol: 179 mg/dL (ref 0–200)
HDL: 51.4 mg/dL (ref >39.00)
LDL Cholesterol: 101 mg/dL — ABNORMAL HIGH (ref 0–99)
NonHDL: 127.14
Total CHOL/HDL Ratio: 3
Triglycerides: 132 mg/dL (ref 0.0–149.0)
VLDL: 26.4 mg/dL (ref 0.0–40.0)

## 2015-06-06 LAB — VITAMIN D 25 HYDROXY (VIT D DEFICIENCY, FRACTURES): VITD: 20.53 ng/mL — ABNORMAL LOW (ref 30.00–100.00)

## 2015-06-06 MED ORDER — ZOLPIDEM TARTRATE 10 MG PO TABS: ORAL_TABLET | ORAL | Status: DC

## 2015-06-06 MED ORDER — VITAMIN D (ERGOCALCIFEROL) 1.25 MG (50000 UNIT) PO CAPS: 50000.0000 [IU] | ORAL_CAPSULE | ORAL | Status: DC

## 2015-06-06 MED ORDER — ALPRAZOLAM 0.5 MG PO TABS: 0.5000 mg | ORAL_TABLET | Freq: Three times a day (TID) | ORAL | Status: DC | PRN

## 2015-06-06 MED ORDER — PROMETHAZINE-DM 6.25-15 MG/5ML PO SYRP: 5.0000 mL | ORAL_SOLUTION | Freq: Four times a day (QID) | ORAL | Status: DC | PRN

## 2015-06-06 MED ORDER — AZITHROMYCIN 250 MG PO TABS: ORAL_TABLET | ORAL | Status: DC

## 2015-06-06 MED ORDER — SERTRALINE HCL 100 MG PO TABS: 100.0000 mg | ORAL_TABLET | Freq: Every day | ORAL | Status: DC

## 2015-06-06 MED ORDER — MONTELUKAST SODIUM 10 MG PO TABS: 10.0000 mg | ORAL_TABLET | Freq: Every day | ORAL | Status: DC

## 2015-06-06 MED ORDER — MOMETASONE FUROATE 50 MCG/ACT NA SUSP: 2.0000 | Freq: Every day | NASAL | Status: DC

## 2015-06-06 MED ORDER — ALBUTEROL SULFATE HFA 108 (90 BASE) MCG/ACT IN AERS: 2.0000 | INHALATION_SPRAY | Freq: Four times a day (QID) | RESPIRATORY_TRACT | Status: DC | PRN

## 2015-06-06 NOTE — Patient Instructions (Signed)
Follow up in 1 year or as needed We'll notify you of your lab results and make any changes if needed Continue to work on healthy diet and regular exercise- you can do it! Start the Zpack as directed for the bronchitis Continue to use your inhaler Mucinex DM for daytime cough, syrup for nights and weekends (will cause drowsiness) REST! Call with any questions or concerns If you want to join [place] at the new [place] office, any scheduled appointments will automatically transfer and we will see you at [address] [place] [address] [place], [place] [address] (OPENING 06/19/15) Happy Holidays!!!

## 2015-06-06 NOTE — Progress Notes (Signed)
Pre visit review using our clinic review tool, if applicable. No additional management support is needed unless otherwise documented below in the visit note. 

## 2015-06-06 NOTE — Progress Notes (Signed)
   Subjective:    Patient ID: Katelyn Reyes, female    DOB: [birth year], 54 y.o.   MRN: [id number]  HPI CPE- UTD on GYN, has repeat colonoscopy scheduled w/ Dr Collene Mares for Feb.    URI- sxs started 2 weeks ago after sick pt coughed in face.  Having chest tightness, dry cough, SOB, some sore throat.  No fevers.  No sinus pain/pressure.  Using inhaler w/o relief.  Review of Systems Patient reports no vision/ hearing changes, adenopathy,fever, weight change,  persistant/recurrent hoarseness , swallowing issues, chest pain, palpitations, edema, hemoptysis, dyspnea (rest/exertional/paroxysmal nocturnal), gastrointestinal bleeding (melena, rectal bleeding), abdominal pain, significant heartburn, bowel changes, GU symptoms (dysuria, hematuria, incontinence), Gyn symptoms (abnormal  bleeding, pain),  syncope, focal weakness, memory loss, numbness & tingling, skin/hair/nail changes, abnormal bruising or bleeding, anxiety, or depression.     Objective:   Physical Exam General Appearance:    Alert, cooperative, no distress, appears stated age  Head:    Normocephalic, without obvious abnormality, atraumatic  Eyes:    PERRL, conjunctiva/corneas clear, EOM's intact, fundi    benign, both eyes  Ears:    Normal TM's and external ear canals, both ears  Nose:   Nares normal, septum midline, mucosa normal, no drainage    or sinus tenderness  Throat:   Lips, mucosa, and tongue normal; teeth and gums normal  Neck:   Supple, symmetrical, trachea midline, no adenopathy;    Thyroid: no enlargement/tenderness/nodules  Back:     Symmetric, no curvature, ROM normal, no CVA tenderness  Lungs:     Clear to auscultation bilaterally, respirations unlabored, + hacking cough  Chest Wall:    No tenderness or deformity   Heart:    Regular rate and rhythm, S1 and S2 normal, no murmur, rub   or gallop  Breast Exam:    Deferred to GYN  Abdomen:     Soft, non-tender, bowel sounds active all four quadrants,    no masses, no  organomegaly  Genitalia:    Deferred to GYN  Rectal:    Extremities:   Extremities normal, atraumatic, no cyanosis or edema  Pulses:   2+ and symmetric all extremities  Skin:   Skin color, texture, turgor normal, no rashes or lesions  Lymph nodes:   Cervical, supraclavicular, and axillary nodes normal  Neurologic:   CNII-XII intact, normal strength, sensation and reflexes    throughout          Assessment & Plan:

## 2015-06-06 NOTE — Assessment & Plan Note (Signed)
Pt again w/ asthmatic bronchitis.  Start Zpack as pt has had sxs x2 weeks.  Cough meds prn.  Reviewed supportive care and red flags that should prompt return.  Pt expressed understanding and is in agreement w/ plan.

## 2015-06-06 NOTE — Assessment & Plan Note (Signed)
Pt's PE WNL w/ exception of current URI and being overweight.  UTD on GYN, colonoscopy.  Check labs.  Anticipatory guidance provided.

## 2015-07-12 DIAGNOSIS — N951 Menopausal and female climacteric states: Secondary | ICD-10-CM | POA: Diagnosis not present

## 2015-07-12 MED FILL — ESTRADIOL 0.5 MG TABLET: 0.5 | 90 days supply | Qty: 90 | Fill #0

## 2015-07-24 DIAGNOSIS — Z800 Family history of malignant neoplasm of digestive organs: Secondary | ICD-10-CM | POA: Diagnosis not present

## 2015-07-24 DIAGNOSIS — Z1211 Encounter for screening for malignant neoplasm of colon: Secondary | ICD-10-CM | POA: Diagnosis not present

## 2015-07-31 MED FILL — MELOXICAM 15 MG TABLET: 15 | 30 days supply | Qty: 30 | Fill #2

## 2015-09-06 MED FILL — MOVIPREP POWDER KIT: 100 | 1 days supply | Qty: 1 | Fill #0

## 2015-09-17 DIAGNOSIS — D127 Benign neoplasm of rectosigmoid junction: Secondary | ICD-10-CM | POA: Diagnosis not present

## 2015-09-17 DIAGNOSIS — Z1211 Encounter for screening for malignant neoplasm of colon: Secondary | ICD-10-CM | POA: Diagnosis not present

## 2015-09-17 DIAGNOSIS — Z733 Stress, not elsewhere classified: Secondary | ICD-10-CM | POA: Diagnosis not present

## 2015-09-17 DIAGNOSIS — N951 Menopausal and female climacteric states: Secondary | ICD-10-CM | POA: Diagnosis not present

## 2015-09-17 DIAGNOSIS — Z800 Family history of malignant neoplasm of digestive organs: Secondary | ICD-10-CM | POA: Diagnosis not present

## 2015-09-17 DIAGNOSIS — K635 Polyp of colon: Secondary | ICD-10-CM | POA: Diagnosis not present

## 2015-09-17 LAB — HM COLONOSCOPY

## 2015-09-19 MED FILL — ALPRAZolam 0.5 MG TABS: 0.5 | 20 days supply | Qty: 60 | Fill #1

## 2015-09-26 ENCOUNTER — Encounter: Payer: Self-pay | Admitting: General Practice

## 2015-10-01 ENCOUNTER — Other Ambulatory Visit: Payer: Self-pay | Admitting: Obstetrics and Gynecology

## 2015-10-01 DIAGNOSIS — R921 Mammographic calcification found on diagnostic imaging of breast: Secondary | ICD-10-CM

## 2015-10-08 MED FILL — ESTRADIOL 1 MG TABLET: 1 | 90 days supply | Qty: 90 | Fill #0

## 2015-10-10 ENCOUNTER — Encounter: Payer: Self-pay | Admitting: Family Medicine

## 2015-10-11 ENCOUNTER — Ambulatory Visit
Admission: RE | Admit: 2015-10-11 | Discharge: 2015-10-11 | Disposition: A | Discharge status: Home / Self Care | Payer: 59 | Source: Ambulatory Visit | Attending: Obstetrics and Gynecology | Admitting: Obstetrics and Gynecology

## 2015-10-11 ENCOUNTER — Other Ambulatory Visit: Payer: Self-pay | Admitting: Obstetrics and Gynecology

## 2015-10-11 DIAGNOSIS — R921 Mammographic calcification found on diagnostic imaging of breast: Secondary | ICD-10-CM

## 2015-10-19 MED FILL — ALPRAZolam 0.5 MG TABS: 0.5 | 20 days supply | Qty: 60 | Fill #2

## 2015-10-19 MED FILL — ZOLPIDEM TARTRATE 10 MG TAB: 10 | 30 days supply | Qty: 30 | Fill #1

## 2015-11-14 DIAGNOSIS — N951 Menopausal and female climacteric states: Secondary | ICD-10-CM | POA: Diagnosis not present

## 2015-11-14 DIAGNOSIS — N950 Postmenopausal bleeding: Secondary | ICD-10-CM | POA: Diagnosis not present

## 2015-11-14 DIAGNOSIS — Z01419 Encounter for gynecological examination (general) (routine) without abnormal findings: Secondary | ICD-10-CM | POA: Diagnosis not present

## 2015-11-14 DIAGNOSIS — Z6835 Body mass index (BMI) 35.0-35.9, adult: Secondary | ICD-10-CM | POA: Diagnosis not present

## 2015-11-14 DIAGNOSIS — Z124 Encounter for screening for malignant neoplasm of cervix: Secondary | ICD-10-CM | POA: Diagnosis not present

## 2015-11-14 DIAGNOSIS — Z3040 Encounter for surveillance of contraceptives, unspecified: Secondary | ICD-10-CM | POA: Diagnosis not present

## 2015-11-14 LAB — HM PAP SMEAR

## 2015-11-15 ENCOUNTER — Encounter: Payer: Self-pay | Admitting: General Practice

## 2015-11-19 ENCOUNTER — Ambulatory Visit (INDEPENDENT_AMBULATORY_CARE_PROVIDER_SITE_OTHER): Payer: 59 | Admitting: Family Medicine

## 2015-11-19 ENCOUNTER — Encounter: Payer: Self-pay | Admitting: Family Medicine

## 2015-11-19 VITALS — BP 114/80 | HR 76 | Temp 98.9°F | Resp 16 | Ht 63.0 in | Wt 196.0 lb

## 2015-11-19 DIAGNOSIS — H918X2 Other specified hearing loss, left ear: Secondary | ICD-10-CM

## 2015-11-19 DIAGNOSIS — H6122 Impacted cerumen, left ear: Secondary | ICD-10-CM | POA: Insufficient documentation

## 2015-11-19 MED ORDER — FLUTICASONE PROPIONATE 50 MCG/ACT NA SUSP: 2.0000 | Freq: Every day | NASAL | Status: DC

## 2015-11-19 MED FILL — FLUTICASONE PROP 50 MCG SPR: 50 | 30 days supply | Qty: 16 | Fill #0

## 2015-11-19 NOTE — Patient Instructions (Signed)
Follow up as needed Continue the Zyrtec, Singulair, and Nasonex daily Add Sudafed x3 days to improve the fluid collection behind the R ear Call with any questions or concerns Hang in there!!!

## 2015-11-19 NOTE — Progress Notes (Signed)
Pre visit review using our clinic review tool, if applicable. No additional management support is needed unless otherwise documented below in the visit note. 

## 2015-11-19 NOTE — Progress Notes (Signed)
   Subjective:    Patient ID: Katelyn Reyes, female    DOB: Mar 18, 1961, 55 y.o.   MRN: [id number]  HPI URI- pt reports sxs started after going to [facility] on May 18th.  Initially thought it was congestion due to flying.  No relief w/ Afrin x3 days.  Reports she is 'unable to hear' out of either ear.  'i just need them to pop'.  + facial pressure.  No cough.  No fevers.  Pt may have poked qtip too far into ear canal and is concerned for R TM rupture.  Using Singulair, Nasonex, Zyrtec.   Review of Systems For ROS see HPI     Objective:   Physical Exam  Constitutional: She appears well-developed and well-nourished. No distress.  HENT:  Head: Normocephalic and atraumatic.  Right Ear: Tympanic membrane normal.  Left Ear: Tympanic membrane normal.  Nose: Mucosal edema and rhinorrhea present. Right sinus exhibits no maxillary sinus tenderness and no frontal sinus tenderness. Left sinus exhibits no maxillary sinus tenderness and no frontal sinus tenderness.  Mouth/Throat: Mucous membranes are normal. Posterior oropharyngeal erythema (w/ PND) present.  R TM w/ clear fluid collection at inferior margin L TM initially obscured by copious cerumen- able to successfully curette and TM WNL  Eyes: Conjunctivae and EOM are normal. Pupils are equal, round, and reactive to light.  Neck: Normal range of motion. Neck supple.  Cardiovascular: Normal rate, regular rhythm and normal heart sounds.   Pulmonary/Chest: Effort normal and breath sounds normal. No respiratory distress. She has no wheezes. She has no rales.  Lymphadenopathy:    She has no cervical adenopathy.  Vitals reviewed.         Assessment & Plan:

## 2015-11-19 NOTE — Assessment & Plan Note (Signed)
New.  No evidence of infxn.  L ear completely cleared of wax and hearing improved.  R ear w/ clear fluid collection inferiorly behind TM.  Pt to continue daily allergy medication and add OTC Sudafed to decrease middle ear effusion.  Reviewed supportive care and red flags that should prompt return.  Pt expressed understanding and is in agreement w/ plan.

## 2015-11-23 MED FILL — FLUCONAZOLE 150 MG TABLET: 150 | 1 days supply | Qty: 1 | Fill #0

## 2015-12-06 DIAGNOSIS — H52223 Regular astigmatism, bilateral: Secondary | ICD-10-CM | POA: Diagnosis not present

## 2015-12-06 DIAGNOSIS — H524 Presbyopia: Secondary | ICD-10-CM | POA: Diagnosis not present

## 2015-12-06 DIAGNOSIS — H5213 Myopia, bilateral: Secondary | ICD-10-CM | POA: Diagnosis not present

## 2015-12-21 ENCOUNTER — Other Ambulatory Visit: Payer: Self-pay | Admitting: Family Medicine

## 2015-12-21 MED FILL — SERTRALINE HCL 100 MG TAB: 100 | 90 days supply | Qty: 90 | Fill #1

## 2015-12-21 MED FILL — MONTELUKAST SOD 10 MG TAB: 10 | 90 days supply | Qty: 90 | Fill #1

## 2015-12-21 MED FILL — ALPRAZolam 0.5 MG TABS: 0.5 | 20 days supply | Qty: 60 | Fill #0

## 2015-12-21 MED FILL — FLUTICASONE PROP 50 MCG SPR: 50 | 30 days supply | Qty: 16 | Fill #1

## 2015-12-21 MED FILL — ZOLPIDEM TARTRATE 10 MG TAB: 10 | 30 days supply | Qty: 30 | Fill #0

## 2015-12-21 NOTE — Telephone Encounter (Signed)
Last OV 11/19/15 Alprazolam last filled 06/06/15 #60 with 3 Zolpidem last filled 06/06/15 #30 with 3

## 2016-01-01 MED FILL — ESTRADIOL 1 MG TABLET: 1 | 90 days supply | Qty: 90 | Fill #0

## 2016-02-06 ENCOUNTER — Other Ambulatory Visit: Payer: Self-pay | Admitting: Obstetrics and Gynecology

## 2016-02-06 DIAGNOSIS — Z1231 Encounter for screening mammogram for malignant neoplasm of breast: Secondary | ICD-10-CM

## 2016-02-13 ENCOUNTER — Ambulatory Visit
Admission: RE | Admit: 2016-02-13 | Discharge: 2016-02-13 | Disposition: A | Discharge status: Home / Self Care | Payer: 59 | Source: Ambulatory Visit | Attending: Obstetrics and Gynecology | Admitting: Obstetrics and Gynecology

## 2016-02-13 DIAGNOSIS — Z1231 Encounter for screening mammogram for malignant neoplasm of breast: Secondary | ICD-10-CM

## 2016-02-15 ENCOUNTER — Other Ambulatory Visit: Payer: Self-pay | Admitting: Obstetrics and Gynecology

## 2016-02-15 DIAGNOSIS — R928 Other abnormal and inconclusive findings on diagnostic imaging of breast: Secondary | ICD-10-CM

## 2016-02-25 ENCOUNTER — Other Ambulatory Visit: Payer: Self-pay | Admitting: Obstetrics and Gynecology

## 2016-02-25 ENCOUNTER — Other Ambulatory Visit: Payer: 59

## 2016-02-25 ENCOUNTER — Ambulatory Visit
Admission: RE | Admit: 2016-02-25 | Discharge: 2016-02-25 | Disposition: A | Discharge status: Home / Self Care | Payer: 59 | Source: Ambulatory Visit | Attending: Obstetrics and Gynecology | Admitting: Obstetrics and Gynecology

## 2016-02-25 DIAGNOSIS — R928 Other abnormal and inconclusive findings on diagnostic imaging of breast: Secondary | ICD-10-CM

## 2016-02-25 DIAGNOSIS — N6320 Unspecified lump in the left breast, unspecified quadrant: Secondary | ICD-10-CM

## 2016-02-25 DIAGNOSIS — N6489 Other specified disorders of breast: Secondary | ICD-10-CM | POA: Diagnosis not present

## 2016-02-28 ENCOUNTER — Ambulatory Visit
Admission: RE | Admit: 2016-02-28 | Discharge: 2016-02-28 | Disposition: A | Discharge status: Home / Self Care | Payer: 59 | Source: Ambulatory Visit | Attending: Obstetrics and Gynecology | Admitting: Obstetrics and Gynecology

## 2016-02-28 DIAGNOSIS — N6320 Unspecified lump in the left breast, unspecified quadrant: Secondary | ICD-10-CM

## 2016-03-24 ENCOUNTER — Ambulatory Visit (INDEPENDENT_AMBULATORY_CARE_PROVIDER_SITE_OTHER): Payer: 59 | Admitting: Family Medicine

## 2016-03-24 ENCOUNTER — Encounter: Payer: Self-pay | Admitting: Family Medicine

## 2016-03-24 VITALS — BP 96/60 | HR 82 | Temp 98.1°F | Ht 62.0 in | Wt 195.4 lb

## 2016-03-24 DIAGNOSIS — T7840XA Allergy, unspecified, initial encounter: Secondary | ICD-10-CM | POA: Diagnosis not present

## 2016-03-24 MED ORDER — PREDNISONE 50 MG PO TABS: 50.0000 mg | ORAL_TABLET | Freq: Every day | ORAL | 0 refills | Status: DC

## 2016-03-24 MED FILL — ALPRAZolam 0.5 MG TABS: 0.5 | 20 days supply | Qty: 60 | Fill #1

## 2016-03-24 MED FILL — ZOLPIDEM TARTRATE 10 MG TAB: 10 | 30 days supply | Qty: 30 | Fill #1

## 2016-03-24 MED FILL — SERTRALINE HCL 100 MG TAB: 100 | 90 days supply | Qty: 90 | Fill #2

## 2016-03-24 MED FILL — MONTELUKAST SOD 10 MG TAB: 10 | 90 days supply | Qty: 90 | Fill #2

## 2016-03-24 MED FILL — predniSONE 50 MG TABS: 50 | 5 days supply | Qty: 5 | Fill #0

## 2016-03-24 NOTE — Patient Instructions (Addendum)
Go to ER if you start having shortness of breath, swelling in your tongue or in your neck.   Zantac 150 mg twice daily may also be helpful.

## 2016-03-24 NOTE — Progress Notes (Signed)
Chief Complaint  Patient presents with  . Rash    Pt reports waking up saturday morning and noticed swelling in the face with itching in both arms ears and face/ Pt reports using hydrocortisone cream singulair and zyrtec with no relief /Pt states she also has taken Benadryl yesterday twice with no relief     Katelyn Reyes is a 55 y.o. female here for an allergic reaction.  Duration: 2 days Any new medications, lotions, soaps, topicals or detergents? No ACEi/ARB/Estrogen? No Hx of allergic rxn/angioedema/anaphylaxis? No she specifically denies shortness of breath, tongue or lip swelling, or swelling in the throat. Takes Zyrtec, has tried HC cream and Benadryl with no relief.   ROS Allergic: As noted in HPI Pulmonary: No SOB MSK: No masses  Past Medical History:  Diagnosis Date  . Allergic rhinitis   . Asthma    related to seasonal allergies  . Breast calcification, right 02/02/2013   Surgically excised 02/17/13 > path benign   . Depression   . Hyperlipidemia   . IUD 02/07/10    Family History  Problem Relation Age of Onset  . Coronary artery disease Neg Hx   . Hypertension Father   . Diabetes Father   . Stroke Maternal Grandmother   . Colon cancer Paternal Grandmother   . Breast cancer      aunt  . Fibrocystic breast disease Mother     BP 96/60 (BP Location: Left Arm, Patient Position: Sitting, Cuff Size: Large)   Pulse 82   Temp 98.1 F (36.7 C) (Oral)   Ht 5\' 2"  (1.575 m)   Wt 195 lb 6.4 oz (88.6 kg)   SpO2 98%   BMI 35.74 kg/m  General: Well appearing, appearing stated age, well-nourished, awake HEENT: Ears are patent, TM's negative, Nose patent without discharge, MMM, tongue without deviation or edema, uvula without edema, pharynx without erythema or petechiae; Neck without masses, edema or asymmetry Heart: RRR, no murmurs, no LE edema Lungs: CTAB, no rales or stridor, normal respiratory effort without accessory muscle use Neuro: Alert and oriented, fluent  and goal-oriented speech Skin: Exposed skin is warm and dry some scaling and minimal excoriation on the posterior forearm on R, I do not appreciate any  Psych: Age appropriate judgment and insight, normal affect and mood  Allergic reaction, initial encounter - Plan: predniSONE (DELTASONE) 50 MG tablet  Orders as above. Continue Zyrtec. Take Zantac twice daily. Continue with moisturizer and avoiding triggers when able.  Pt informed to seek emergent care if starting to experience SOB, swelling with tongue or airway/neck.  F/u in 2 weeks if symptoms do not resolve. The patient voiced understanding and agreement to the plan.  [place], DO 03/24/16 1:35 PM

## 2016-03-24 NOTE — Progress Notes (Signed)
Pre visit review using our clinic review tool, if applicable. No additional management support is needed unless otherwise documented below in the visit note. 

## 2016-05-06 MED FILL — ESTRADIOL 1 MG TABLET: 1 | 90 days supply | Qty: 90 | Fill #0

## 2016-05-06 MED FILL — ALPRAZolam 0.5 MG TABS: 0.5 | 20 days supply | Qty: 60 | Fill #2

## 2016-05-06 MED FILL — ZOLPIDEM TARTRATE 10 MG TAB: 10 | 30 days supply | Qty: 30 | Fill #2

## 2016-05-09 MED FILL — FLUTICASONE PROP 50 MCG SPR: 50 | 30 days supply | Qty: 16 | Fill #2

## 2016-05-12 ENCOUNTER — Encounter: Payer: Self-pay | Admitting: Family Medicine

## 2016-05-12 ENCOUNTER — Ambulatory Visit (INDEPENDENT_AMBULATORY_CARE_PROVIDER_SITE_OTHER): Payer: 59 | Admitting: Family Medicine

## 2016-05-12 VITALS — BP 122/81 | HR 96 | Temp 98.5°F | Resp 17 | Ht 62.0 in | Wt 190.2 lb

## 2016-05-12 DIAGNOSIS — J0110 Acute frontal sinusitis, unspecified: Secondary | ICD-10-CM

## 2016-05-12 MED ORDER — FLUCONAZOLE 150 MG PO TABS: 150.0000 mg | ORAL_TABLET | Freq: Once | ORAL | 0 refills | Status: AC

## 2016-05-12 MED ORDER — AMOXICILLIN 875 MG PO TABS: 875.0000 mg | ORAL_TABLET | Freq: Two times a day (BID) | ORAL | 0 refills | Status: DC

## 2016-05-12 MED ORDER — GUAIFENESIN-CODEINE 100-10 MG/5ML PO SYRP: 10.0000 mL | ORAL_SOLUTION | Freq: Three times a day (TID) | ORAL | 0 refills | Status: DC | PRN

## 2016-05-12 MED ORDER — IPRATROPIUM-ALBUTEROL 0.5-2.5 (3) MG/3ML IN SOLN
3.0000 mL | Freq: Once | RESPIRATORY_TRACT | Status: AC
  Administered 2016-05-12: 3 mL via RESPIRATORY_TRACT

## 2016-05-12 MED FILL — AMOXICILLIN 875 MG TABLET: 875 | 10 days supply | Qty: 20 | Fill #0

## 2016-05-12 MED FILL — FLUCONAZOLE 150 MG TABLET: 150 | 1 days supply | Qty: 1 | Fill #0

## 2016-05-12 MED FILL — CHERATUSSIN AC SYRUP: 100-10 | 8 days supply | Qty: 240 | Fill #0

## 2016-05-12 NOTE — Progress Notes (Signed)
   Subjective:    Patient ID: Katelyn Reyes, female    DOB: December 07, 1960, 55 y.o.   MRN: [id number]  HPI URI- sxs started 4 days ago.  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  No fever.  Coughing but not productive.  Taking Mucinex and using inhaler w/o relief.  + sore throat, laryngitis, bilateral ear pain.  + HA, no facial pain.  No tooth pain.  No N/V/D.   Review of Systems For ROS see HPI     Objective:   Physical Exam  Constitutional: She appears well-developed and well-nourished. No distress.  HENT:  Head: Normocephalic and atraumatic.  Right Ear: A middle ear effusion is present.  Left Ear: A middle ear effusion is present.  Nose: Mucosal edema and rhinorrhea present. Right sinus exhibits maxillary sinus tenderness and frontal sinus tenderness. Left sinus exhibits maxillary sinus tenderness and frontal sinus tenderness.  Mouth/Throat: Uvula is midline and mucous membranes are normal. Posterior oropharyngeal erythema present. No oropharyngeal exudate.  Eyes: Conjunctivae and EOM are normal. Pupils are equal, round, and reactive to light.  Neck: Normal range of motion. Neck supple.  Cardiovascular: Normal rate, regular rhythm and normal heart sounds.   Pulmonary/Chest: Effort normal and breath sounds normal. No respiratory distress. She has no wheezes.  Decreased air movement throughout- improved s/p neb tx  Lymphadenopathy:    She has no cervical adenopathy.  Vitals reviewed.         Assessment & Plan:

## 2016-05-12 NOTE — Assessment & Plan Note (Signed)
Pt's sxs and PE consistent w/ infxn.  Start abx.  Pt had decreased air movement throughout but this improved s/p neb tx.  Reviewed supportive care and red flags that should prompt return.  Pt expressed understanding and is in agreement w/ plan.

## 2016-05-12 NOTE — Progress Notes (Signed)
Pre visit review using our clinic review tool, if applicable. No additional management support is needed unless otherwise documented below in the visit note. 

## 2016-05-12 NOTE — Patient Instructions (Signed)
Follow up as needed Start the Amoxicillin twice daily- take w/ food Use the codeine based cough syrup as needed- will cause drowsiness Continue Mucinex DM for daytime cough Albuterol inhaler as needed Diflucan in case of yeast Drink plenty of fluids REST!! Call with any questions or concerns Hang in there!!!

## 2016-05-13 ENCOUNTER — Encounter: Payer: Self-pay | Admitting: Physician Assistant

## 2016-05-13 ENCOUNTER — Ambulatory Visit (INDEPENDENT_AMBULATORY_CARE_PROVIDER_SITE_OTHER): Payer: 59 | Admitting: Physician Assistant

## 2016-05-13 VITALS — BP 110/70 | HR 86 | Temp 98.4°F | Resp 16 | Ht 62.0 in | Wt 190.0 lb

## 2016-05-13 DIAGNOSIS — B9689 Other specified bacterial agents as the cause of diseases classified elsewhere: Secondary | ICD-10-CM

## 2016-05-13 DIAGNOSIS — J019 Acute sinusitis, unspecified: Secondary | ICD-10-CM

## 2016-05-13 DIAGNOSIS — T887XXA Unspecified adverse effect of drug or medicament, initial encounter: Secondary | ICD-10-CM

## 2016-05-13 DIAGNOSIS — T50905A Adverse effect of unspecified drugs, medicaments and biological substances, initial encounter: Secondary | ICD-10-CM

## 2016-05-13 MED ORDER — DOXYCYCLINE HYCLATE 100 MG PO CAPS: 100.0000 mg | ORAL_CAPSULE | Freq: Two times a day (BID) | ORAL | 0 refills | Status: DC

## 2016-05-13 MED ORDER — METHYLPREDNISOLONE ACETATE 80 MG/ML IJ SUSP
80.0000 mg | Freq: Once | INTRAMUSCULAR | Status: AC
  Administered 2016-05-13: 40 mg via INTRAMUSCULAR

## 2016-05-13 MED FILL — DOXYCYCLINE HYCLATE 100 MG: 100 | 10 days supply | Qty: 20 | Fill #0

## 2016-05-13 NOTE — Addendum Note (Signed)
Addended by: Raiford Noble on: 05/13/2016 09:47 AM   Modules accepted: Orders

## 2016-05-13 NOTE — Patient Instructions (Signed)
No more Amoxicillin. I am adding to your allergy list.  Please take new antibiotic (Doxycycline) as directed.  Increase fluid intake.  Use Saline nasal spray.  Take a daily multivitamin. Continue supportive measures discussed at yesterday's visit.  Place a humidifier in the bedroom.  Please call or return clinic if symptoms are not improving.  Sinusitis Sinusitis is redness, soreness, and swelling (inflammation) of the paranasal sinuses. Paranasal sinuses are air pockets within the bones of your face (beneath the eyes, the middle of the forehead, or above the eyes). In healthy paranasal sinuses, mucus is able to drain out, and air is able to circulate through them by way of your nose. However, when your paranasal sinuses are inflamed, mucus and air can become trapped. This can allow bacteria and other germs to grow and cause infection. Sinusitis can develop quickly and last only a short time (acute) or continue over a long period (chronic). Sinusitis that lasts for more than 12 weeks is considered chronic.  CAUSES  Causes of sinusitis include:  Allergies.  Structural abnormalities, such as displacement of the cartilage that separates your nostrils (deviated septum), which can decrease the air flow through your nose and sinuses and affect sinus drainage.  Functional abnormalities, such as when the small hairs (cilia) that line your sinuses and help remove mucus do not work properly or are not present. SYMPTOMS  Symptoms of acute and chronic sinusitis are the same. The primary symptoms are pain and pressure around the affected sinuses. Other symptoms include:  Upper toothache.  Earache.  Headache.  Bad breath.  Decreased sense of smell and taste.  A cough, which worsens when you are lying flat.  Fatigue.  Fever.  Thick drainage from your nose, which often is green and may contain pus (purulent).  Swelling and warmth over the affected sinuses. DIAGNOSIS  Your caregiver will perform  a physical exam. During the exam, your caregiver may:  Look in your nose for signs of abnormal growths in your nostrils (nasal polyps).  Tap over the affected sinus to check for signs of infection.  View the inside of your sinuses (endoscopy) with a special imaging device with a light attached (endoscope), which is inserted into your sinuses. If your caregiver suspects that you have chronic sinusitis, one or more of the following tests may be recommended:  Allergy tests.  Nasal culture A sample of mucus is taken from your nose and sent to a lab and screened for bacteria.  Nasal cytology A sample of mucus is taken from your nose and examined by your caregiver to determine if your sinusitis is related to an allergy. TREATMENT  Most cases of acute sinusitis are related to a viral infection and will resolve on their own within 10 days. Sometimes medicines are prescribed to help relieve symptoms (pain medicine, decongestants, nasal steroid sprays, or saline sprays).  However, for sinusitis related to a bacterial infection, your caregiver will prescribe antibiotic medicines. These are medicines that will help kill the bacteria causing the infection.  Rarely, sinusitis is caused by a fungal infection. In theses cases, your caregiver will prescribe antifungal medicine. For some cases of chronic sinusitis, surgery is needed. Generally, these are cases in which sinusitis recurs more than 3 times per year, despite other treatments. HOME CARE INSTRUCTIONS   Drink plenty of water. Water helps thin the mucus so your sinuses can drain more easily.  Use a humidifier.  Inhale steam 3 to 4 times a day (for example, sit in the  bathroom with the shower running).  Apply a warm, moist washcloth to your face 3 to 4 times a day, or as directed by your caregiver.  Use saline nasal sprays to help moisten and clean your sinuses.  Take over-the-counter or prescription medicines for pain, discomfort, or fever only  as directed by your caregiver. SEEK IMMEDIATE MEDICAL CARE IF:  You have increasing pain or severe headaches.  You have nausea, vomiting, or drowsiness.  You have swelling around your face.  You have vision problems.  You have a stiff neck.  You have difficulty breathing. MAKE SURE YOU:   Understand these instructions.  Will watch your condition.  Will get help right away if you are not doing well or get worse. Document Released: 06/02/2005 Document Revised: 08/25/2011 Document Reviewed: 06/17/2011 [facility] Patient Information 2014 [place], [place].

## 2016-05-13 NOTE — Progress Notes (Signed)
Patient presents to clinic today for follow-up of sinusitis. Patient was seen yesterday in clinic by her PCP. Was started on Amoxicillin 875 mg BID for sinusitis. Took two doses and noted redness of skin with significant itching. Denies racing heart, shortness of breath, tongue swelling. Took Benadryl and Zantac and went to bed. Woke up feeling better without any residual itching. Has not taken any more Amoxicillin. Notes continued sinus pressure with sinus pain and dry cough. Denies fever, chills, chest pain. Denies new symptoms.   Past Medical History:  Diagnosis Date  . Allergic rhinitis   . Asthma    related to seasonal allergies  . Breast calcification, right 02/02/2013   Surgically excised 02/17/13 > path benign   . Depression   . Hyperlipidemia   . IUD 02/07/10    Current Outpatient Prescriptions on File Prior to Visit  Medication Sig Dispense Refill  . albuterol (PROVENTIL HFA;VENTOLIN HFA) 108 (90 BASE) MCG/ACT inhaler Inhale 2 puffs into the lungs every 6 (six) hours as needed for wheezing. 1 Inhaler 6  . ALPRAZolam (XANAX) 0.5 MG tablet TAKE 1 TABLET BY MOUTH 3 TIMES DAILY AS NEEDED 60 tablet PRN  . Biotin 10 MG CAPS Take 1 capsule by mouth daily.    . cetirizine (ZYRTEC) 10 MG tablet Take 1 tablet (10 mg total) by mouth daily. 90 tablet 3  . estradiol (ESTRACE) 1 MG tablet   0  . fluticasone (FLONASE) 50 MCG/ACT nasal spray Place 2 sprays into both nostrils daily. 16 g 6  . levonorgestrel (MIRENA) 20 MCG/24HR IUD 1 each by Intrauterine route once.      . meloxicam (MOBIC) 15 MG tablet Take 1 tablet (15 mg total) by mouth daily. 30 tablet 2  . montelukast (SINGULAIR) 10 MG tablet Take 1 tablet (10 mg total) by mouth daily. 90 tablet 3  . Multiple Vitamin (MULTIVITAMIN WITH MINERALS) TABS tablet Take 1 tablet by mouth daily.    . sertraline (ZOLOFT) 100 MG tablet Take 1 tablet (100 mg total) by mouth daily. 90 tablet 3  . zolpidem (AMBIEN) 10 MG tablet TAKE 1 TABLET BY MOUTH  ONCE DAILY AT BEDTIME AS NEEDED FOR SLEEP 30 tablet PRN  . amoxicillin (AMOXIL) 875 MG tablet Take 1 tablet (875 mg total) by mouth 2 (two) times daily. (Patient not taking: Reported on 05/13/2016) 20 tablet 0  . guaiFENesin-codeine (ROBITUSSIN AC) 100-10 MG/5ML syrup Take 10 mLs by mouth 3 (three) times daily as needed for cough. (Patient not taking: Reported on 05/13/2016) 240 mL 0   No current facility-administered medications on file prior to visit.     Allergies  Allergen Reactions  . Erythromycin Nausea Only  . Hydrocodone-Acetaminophen     REACTION: hallucinations  . Shrimp [Shellfish Allergy] Itching    Scallops as well    Family History  Problem Relation Age of Onset  . Hypertension Father   . Diabetes Father   . Fibrocystic breast disease Mother   . Stroke Maternal Grandmother   . Colon cancer Paternal Grandmother   . Breast cancer      aunt  . Coronary artery disease Neg Hx     Social History   Social History  . Marital status: Single    Spouse name: N/A  . Number of children: N/A  . Years of education: N/A   Social History Main Topics  . Smoking status: Never Smoker  . Smokeless tobacco: Never Used  . Alcohol use No     Comment:  social  . Drug use: No  . Sexual activity: Yes    Birth control/ protection: IUD   Other Topics Concern  . None   Social History Narrative  . None   Review of Systems - See HPI.  All other ROS are negative.  BP 110/70   Pulse 86   Temp 98.4 F (36.9 C) (Oral)   Resp 16   Ht 5\' 2"  (1.575 m)   Wt 190 lb (86.2 kg)   SpO2 96%   BMI 34.75 kg/m   Physical Exam  Constitutional: She is oriented to person, place, and time and well-developed, well-nourished, and in no distress.  HENT:  Head: Normocephalic and atraumatic.  Right Ear: Tympanic membrane is not erythematous. A middle ear effusion is present.  Left Ear: Tympanic membrane is not erythematous. A middle ear effusion is present.  Nose: Mucosal edema and  rhinorrhea present. Right sinus exhibits frontal sinus tenderness. Left sinus exhibits frontal sinus tenderness.  Mouth/Throat: Uvula is midline, oropharynx is clear and moist and mucous membranes are normal.  Eyes: Conjunctivae are normal.  Neck: Neck supple.  Cardiovascular: Normal rate, regular rhythm, normal heart sounds and intact distal pulses.   Pulmonary/Chest: Effort normal and breath sounds normal. No respiratory distress. She has no wheezes. She has no rales. She exhibits no tenderness.  Neurological: She is alert and oriented to person, place, and time.  Skin: Skin is warm and dry. No rash noted.  Psychiatric: Affect normal.  Vitals reviewed.  Assessment/Plan: 1. Acute bacterial sinusitis Rx Doxycycline.  Increase fluids.  Rest.  Saline nasal spray.  Probiotic.  Mucinex as directed.  Humidifier in bedroom. 40 IM depomedrol given in office.  Call or return to clinic if symptoms are not improving.   2. Adverse effect of drug, initial encounter Amoxicillin -- resolved with d/c of drug and antihistamines. Added to allergy list.    Leeanne Rio, PA-C

## 2016-05-13 NOTE — Addendum Note (Signed)
Addended by: Leonidas Romberg on: 05/13/2016 10:11 AM   Modules accepted: Orders

## 2016-05-13 NOTE — Progress Notes (Signed)
Pre visit review using our clinic review tool, if applicable. No additional management support is needed unless otherwise documented below in the visit note. 

## 2016-06-06 ENCOUNTER — Encounter: Payer: 59 | Admitting: Family Medicine

## 2016-06-19 MED FILL — ZOLPIDEM TARTRATE 10 MG TAB: 10 | 30 days supply | Qty: 30 | Fill #3

## 2016-06-19 MED FILL — ALPRAZolam 0.5 MG TABS: 0.5 | 20 days supply | Qty: 60 | Fill #3

## 2016-07-01 ENCOUNTER — Ambulatory Visit (INDEPENDENT_AMBULATORY_CARE_PROVIDER_SITE_OTHER): Payer: 59 | Admitting: Family Medicine

## 2016-07-01 ENCOUNTER — Encounter: Payer: Self-pay | Admitting: Family Medicine

## 2016-07-01 ENCOUNTER — Other Ambulatory Visit: Payer: Self-pay | Admitting: Family Medicine

## 2016-07-01 VITALS — BP 112/78 | HR 76 | Temp 98.2°F | Resp 16 | Ht 62.0 in | Wt 188.4 lb

## 2016-07-01 DIAGNOSIS — Z0000 Encounter for general adult medical examination without abnormal findings: Secondary | ICD-10-CM

## 2016-07-01 LAB — HEPATIC FUNCTION PANEL
ALT: 20 U/L (ref 0–35)
AST: 20 U/L (ref 0–37)
Albumin: 4.1 g/dL (ref 3.5–5.2)
Alkaline Phosphatase: 58 U/L (ref 39–117)
Bilirubin, Direct: 0.1 mg/dL (ref 0.0–0.3)
Total Bilirubin: 0.4 mg/dL (ref 0.2–1.2)
Total Protein: 7.3 g/dL (ref 6.0–8.3)

## 2016-07-01 LAB — LIPID PANEL
Cholesterol: 177 mg/dL (ref 0–200)
HDL: 53.8 mg/dL (ref >39.00)
LDL Cholesterol: 110 mg/dL — ABNORMAL HIGH (ref 0–99)
NonHDL: 123.12
Total CHOL/HDL Ratio: 3
Triglycerides: 67 mg/dL (ref 0.0–149.0)
VLDL: 13.4 mg/dL (ref 0.0–40.0)

## 2016-07-01 LAB — CBC WITH DIFFERENTIAL/PLATELET
Basophils Absolute: 0 10*3/uL (ref 0.0–0.1)
Basophils Relative: 0.6 % (ref 0.0–3.0)
Eosinophils Absolute: 0.1 10*3/uL (ref 0.0–0.7)
Eosinophils Relative: 1.9 % (ref 0.0–5.0)
HCT: 38.3 % (ref 36.0–46.0)
Hemoglobin: 12.8 g/dL (ref 12.0–15.0)
Lymphocytes Relative: 53.3 % — ABNORMAL HIGH (ref 12.0–46.0)
Lymphs Abs: 3.1 10*3/uL (ref 0.7–4.0)
MCHC: 33.4 g/dL (ref 30.0–36.0)
MCV: 94.2 fl (ref 78.0–100.0)
Monocytes Absolute: 0.4 10*3/uL (ref 0.1–1.0)
Monocytes Relative: 6.1 % (ref 3.0–12.0)
Neutro Abs: 2.2 10*3/uL (ref 1.4–7.7)
Neutrophils Relative %: 38.1 % — ABNORMAL LOW (ref 43.0–77.0)
Platelets: 308 10*3/uL (ref 150.0–400.0)
RBC: 4.07 Mil/uL (ref 3.87–5.11)
RDW: 13.9 % (ref 11.5–15.5)
WBC: 5.8 10*3/uL (ref 4.0–10.5)

## 2016-07-01 LAB — BASIC METABOLIC PANEL
BUN: 15 mg/dL (ref 6–23)
CO2: 27 mEq/L (ref 19–32)
Calcium: 9 mg/dL (ref 8.4–10.5)
Chloride: 107 mEq/L (ref 96–112)
Creatinine, Ser: 0.86 mg/dL (ref 0.40–1.20)
GFR: 88.74 mL/min (ref >60.00)
Glucose, Bld: 93 mg/dL (ref 70–99)
Potassium: 4.4 mEq/L (ref 3.5–5.1)
Sodium: 137 mEq/L (ref 135–145)

## 2016-07-01 LAB — VITAMIN D 25 HYDROXY (VIT D DEFICIENCY, FRACTURES): VITD: 16.96 ng/mL — ABNORMAL LOW (ref 30.00–100.00)

## 2016-07-01 LAB — TSH: TSH: 1.14 u[IU]/mL (ref 0.35–4.50)

## 2016-07-01 NOTE — Patient Instructions (Addendum)
Follow up in 1 year or as needed We'll notify you of your lab results and make any changes if needed Keep up the good work on healthy diet and regular exercise- you look great! Call with any questions or concerns Happy New Year!!! 

## 2016-07-01 NOTE — Telephone Encounter (Signed)
Pt LM stating that she forgot to get refills on her xanax and ambien when she was in this morning. Pt would like a call back when this is ready to be picked up her at our office.

## 2016-07-01 NOTE — Assessment & Plan Note (Signed)
Pt's PE WNL w/ exception of being overweight.  UTD on GYN and colonoscopy.  Check labs.  Anticipatory guidance provided.

## 2016-07-01 NOTE — Progress Notes (Signed)
Pre visit review using our clinic review tool, if applicable. No additional management support is needed unless otherwise documented below in the visit note. 

## 2016-07-01 NOTE — Progress Notes (Signed)
   Subjective:    Patient ID: Katelyn Reyes, female    DOB: 19-Aug-1960, 56 y.o.   MRN: [id number]  HPI CPE- UTD on pap, mammo, colonoscopy, immunizations.   Review of Systems Patient reports no vision/ hearing changes, adenopathy,fever, weight change,  persistant/recurrent hoarseness , swallowing issues, chest pain, palpitations, edema, persistant/recurrent cough, hemoptysis, dyspnea (rest/exertional/paroxysmal nocturnal), gastrointestinal bleeding (melena, rectal bleeding), abdominal pain, significant heartburn, bowel changes, GU symptoms (dysuria, hematuria, incontinence), Gyn symptoms (abnormal  bleeding, pain),  syncope, focal weakness, memory loss, numbness & tingling, skin/hair/nail changes, abnormal bruising or bleeding, anxiety, or depression.     Objective:   Physical Exam General Appearance:    Alert, cooperative, no distress, appears stated age  Head:    Normocephalic, without obvious abnormality, atraumatic  Eyes:    PERRL, conjunctiva/corneas clear, EOM's intact, fundi    benign, both eyes  Ears:    Normal TM's and external ear canals, both ears  Nose:   Nares normal, septum midline, mucosa normal, no drainage    or sinus tenderness  Throat:   Lips, mucosa, and tongue normal; teeth and gums normal  Neck:   Supple, symmetrical, trachea midline, no adenopathy;    Thyroid: no enlargement/tenderness/nodules  Back:     Symmetric, no curvature, ROM normal, no CVA tenderness  Lungs:     Clear to auscultation bilaterally, respirations unlabored  Chest Wall:    No tenderness or deformity   Heart:    Regular rate and rhythm, S1 and S2 normal, no murmur, rub   or gallop  Breast Exam:    Deferred to GYN  Abdomen:     Soft, non-tender, bowel sounds active all four quadrants,    no masses, no organomegaly  Genitalia:    Deferred to GYN  Rectal:    Extremities:   Extremities normal, atraumatic, no cyanosis or edema  Pulses:   2+ and symmetric all extremities  Skin:   Skin color,  texture, turgor normal, no rashes or lesions  Lymph nodes:   Cervical, supraclavicular, and axillary nodes normal  Neurologic:   CNII-XII intact, normal strength, sensation and reflexes    throughout          Assessment & Plan:

## 2016-07-04 MED ORDER — ZOLPIDEM TARTRATE 10 MG PO TABS: ORAL_TABLET | ORAL | 3 refills | Status: DC

## 2016-07-04 MED ORDER — ALPRAZOLAM 0.5 MG PO TABS: 0.5000 mg | ORAL_TABLET | Freq: Three times a day (TID) | ORAL | 3 refills | Status: DC | PRN

## 2016-07-04 NOTE — Telephone Encounter (Signed)
Pt informed, meds available at front desk for pick up as requested.

## 2016-07-04 NOTE — Telephone Encounter (Signed)
Last OV 07/01/16 Alprazolam last filled 12/21/15 #60 with 0 Zolpidem last filled 12/21/15 #30 with 0  Filled by another provider please advise?

## 2016-07-14 ENCOUNTER — Other Ambulatory Visit: Payer: Self-pay | Admitting: Family Medicine

## 2016-07-16 ENCOUNTER — Other Ambulatory Visit: Payer: Self-pay | Admitting: General Practice

## 2016-07-16 ENCOUNTER — Telehealth: Payer: Self-pay | Admitting: Family Medicine

## 2016-07-16 MED ORDER — VITAMIN D (ERGOCALCIFEROL) 1.25 MG (50000 UNIT) PO CAPS: 50000.0000 [IU] | ORAL_CAPSULE | ORAL | 0 refills | Status: DC

## 2016-07-16 MED FILL — VIT D2 1.25 MG (50,000 UNIT: 1.25 MG | 84 days supply | Qty: 12 | Fill #0

## 2016-07-16 NOTE — Telephone Encounter (Signed)
Patient states she went to pick up rx for vitamin D from [place] and they said she had no refills remaining.  Patient requesting refill  be sent to pharmacy.

## 2016-07-16 NOTE — Telephone Encounter (Signed)
Medication filled to pharmacy as requested.  Pt made aware.  

## 2016-08-14 MED FILL — ESTRADIOL 1 MG TABLET: 1 | 90 days supply | Qty: 90 | Fill #0

## 2016-08-16 ENCOUNTER — Encounter (HOSPITAL_COMMUNITY): Payer: Self-pay | Admitting: Family Medicine

## 2016-08-16 ENCOUNTER — Ambulatory Visit (INDEPENDENT_AMBULATORY_CARE_PROVIDER_SITE_OTHER): Payer: 59

## 2016-08-16 ENCOUNTER — Ambulatory Visit (HOSPITAL_COMMUNITY)
Admission: EM | Admit: 2016-08-16 | Discharge: 2016-08-16 | Disposition: A | Discharge status: Home / Self Care | Payer: 59 | Attending: Family Medicine | Admitting: Family Medicine

## 2016-08-16 DIAGNOSIS — S92902A Unspecified fracture of left foot, initial encounter for closed fracture: Secondary | ICD-10-CM

## 2016-08-16 DIAGNOSIS — S99922A Unspecified injury of left foot, initial encounter: Secondary | ICD-10-CM | POA: Diagnosis not present

## 2016-08-16 DIAGNOSIS — M79672 Pain in left foot: Secondary | ICD-10-CM | POA: Diagnosis not present

## 2016-08-16 MED ORDER — OXYCODONE HCL 5 MG PO CAPS
5.0000 mg | ORAL_CAPSULE | ORAL | 0 refills | Status: DC | PRN
Start: 2016-08-16 — End: 2017-07-07

## 2016-08-16 NOTE — Discharge Instructions (Signed)
You will likely only need the stronger pain medication over the first 3-4 days and then Motrin and Tylenol will be sufficient. Ice/cold for 10-15 min every 3-4 hours while awake. Try not to bear weight on the area. Use the hard soled shoe when you walk.

## 2016-08-16 NOTE — ED Triage Notes (Signed)
Pt here for left foot pain after bending it backwards yesterday. sts that she got it caught in the dog gate.

## 2016-08-16 NOTE — ED Notes (Signed)
Med   l   Orthopedic  Shoe

## 2016-08-16 NOTE — ED Provider Notes (Signed)
[facility]    CSN: [id number] Arrival date & time: 08/16/16  1650   History   Chief Complaint Chief Complaint  Patient presents with  . Foot Pain   HPI Katelyn Reyes is a 56 y.o. female.   HPI  The patient was doing laundry yesterday and going down the stairs. Her left foot got caught between staring the dog ate and she had a fall. She states her foot went backwards. She notices pain over the top of her foot. While she denies numbness, tingling, or redness, she is having some swelling and difficulty walking. She has tried icing it at home. She's not tried any other medications.  Past Medical History:  Diagnosis Date  . Allergic rhinitis   . Asthma    related to seasonal allergies  . Breast calcification, right 02/02/2013   Surgically excised 02/17/13 > path benign   . Depression   . Hyperlipidemia   . IUD 02/07/10    Patient Active Problem List   Diagnosis Date Noted  . Hearing loss of left ear due to cerumen impaction 11/19/2015  . Urinary incontinence in female 12/22/2013  . Asthma, mild intermittent 03/22/2013  . Bronchitis with obstruction ([place]) 06/28/2012  . Vaginal itching 06/01/2012  . Thyromegaly 03/18/2012  . General medical examination 03/14/2011  . Acute sinusitis 08/26/2010  . CHICKENPOX, HX OF 02/15/2010  . GASTROENTERITIS, ACUTE 08/15/2009  . HYPERLIPIDEMIA 02/14/2009  . DEPRESSION 02/14/2009  . ALLERGIC RHINITIS 02/14/2009    Past Surgical History:  Procedure Laterality Date  . BREAST BIOPSY Right 02/17/2013   Procedure:  NEEDLE LOCALIZATION REMOVAL RIGHT BREAST CALCIFICATIONS;  Surgeon: Haywood Lasso, MD;  Location: [facility];  Service: General;  Laterality: Right;  . BURCH PROCEDURE N/A 12/22/2013   Procedure: BURCH CYSTO URETHROPEXY, ANTERIOR AND POSTERIOR CULPORRHAPHY;  Surgeon: Ena Dawley, MD;  Location: [facility];  Service: Gynecology;  Laterality: N/A;  . MOUTH SURGERY    . NASAL SINUS SURGERY       Home  Medications    Prior to Admission medications   Medication Sig Start Date End Date Taking? Authorizing Provider  albuterol (PROVENTIL HFA;VENTOLIN HFA) 108 (90 BASE) MCG/ACT inhaler Inhale 2 puffs into the lungs every 6 (six) hours as needed for wheezing. 06/06/15   Midge Minium, MD  ALPRAZolam Duanne Moron) 0.5 MG tablet Take 1 tablet (0.5 mg total) by mouth 3 (three) times daily as needed. 07/04/16   Midge Minium, MD  cetirizine (ZYRTEC) 10 MG tablet Take 1 tablet (10 mg total) by mouth daily. 04/25/14   Midge Minium, MD  estradiol (ESTRACE) 1 MG tablet  10/08/15   Historical Provider, MD  fluticasone (FLONASE) 50 MCG/ACT nasal spray Place 2 sprays into both nostrils daily. 11/19/15   Midge Minium, MD  levonorgestrel (MIRENA) 20 MCG/24HR IUD 1 each by Intrauterine route once.      Historical Provider, MD  montelukast (SINGULAIR) 10 MG tablet Take 1 tablet (10 mg total) by mouth daily. 06/06/15   Midge Minium, MD  Multiple Vitamin (MULTIVITAMIN WITH MINERALS) TABS tablet Take 1 tablet by mouth daily.    Historical Provider, MD  oxycodone (OXY-IR) 5 MG capsule Take 1 capsule (5 mg total) by mouth every 4 (four) hours as needed. 08/16/16   Shelda Pal, DO  sertraline (ZOLOFT) 100 MG tablet Take 1 tablet (100 mg total) by mouth daily. 06/06/15   Midge Minium, MD  Vitamin D, Ergocalciferol, (DRISDOL) 50000 units CAPS capsule  Take 1 capsule (50,000 Units total) by mouth every 7 (seven) days. 07/16/16   Midge Minium, MD  zolpidem (AMBIEN) 10 MG tablet TAKE 1 TABLET BY MOUTH ONCE DAILY AT BEDTIME AS NEEDED FOR SLEEP 07/04/16   Midge Minium, MD    Family History Family History  Problem Relation Age of Onset  . Hypertension Father   . Diabetes Father   . Fibrocystic breast disease Mother   . Stroke Maternal Grandmother   . Colon cancer Paternal Grandmother   . Breast cancer      aunt  . Coronary artery disease Neg Hx     Social History Social  History  Substance Use Topics  . Smoking status: Never Smoker  . Smokeless tobacco: Never Used  . Alcohol use No     Comment: social     Allergies   Erythromycin; Hydrocodone-acetaminophen; and Shrimp [shellfish allergy]   Review of Systems Review of Systems Neuro: No numbness or tingling MSK: +L foot pain  Physical Exam Triage Vital Signs ED Triage Vitals [08/16/16 1706]  Enc Vitals Group     BP 97/63     Pulse Rate 91     Resp 18     Temp 98.6 F (37 C)     Temp src      SpO2 96 %     Pain Score 5   Updated Vital Signs BP 97/63   Pulse 91   Temp 98.6 F (37 C)   Resp 18   SpO2 96%    Physical Exam  Constitutional: She appears well-developed and well-nourished. No distress.  Cardiovascular: Normal rate, regular rhythm and intact distal pulses.   Pulmonary/Chest: Effort normal.  Neurological: She is alert. No sensory deficit.  Skin: Skin is warm and dry. She is not diaphoretic. No erythema.  Psychiatric: She has a normal mood and affect. Judgment normal.  MSK: Some edema over distal medial L forefoot, TTP over dorsal side of foot over MT 1-3. No ecchymosis or erythema.   UC Treatments / Results  Radiology Dg Foot Complete Left  Result Date: 08/16/2016 CLINICAL DATA:  Pain after trauma. EXAM: LEFT FOOT - COMPLETE 3+ VIEW COMPARISON:  None. FINDINGS: There is no evidence of fracture or dislocation. There is no evidence of arthropathy or other focal bone abnormality. Soft tissues are unremarkable. IMPRESSION: Negative. Electronically Signed   By: Dorise Bullion III M.D   On: 08/16/2016 18:13    Procedures Procedures - none  Initial Impression / Assessment and Plan / UC Course  I have reviewed the triage vital signs and the nursing notes.  Pertinent labs & imaging results that were available during my care of the patient were reviewed by me and considered in my medical decision making (see chart for details).     XR unofficially shows a 2nd MT fx which  is right where she is tender and having swelling. Hard soled shoe provided in addition to pain medication. Pt is neurovascularly intact. To be discharged in stable condition. F/u with her podiatrist this week. Letter given excusing patient until Tuesday. Pt voiced understanding and agreement with the plan.  Final Clinical Impressions(s) / UC Diagnoses   Final diagnoses:  Foot fracture, left, closed, initial encounter    New Prescriptions Discharge Medication List as of 08/16/2016  6:09 PM    START taking these medications   Details  oxycodone (OXY-IR) 5 MG capsule Take 1 capsule (5 mg total) by mouth every 4 (four) hours as needed., Starting  Sat 08/16/2016, Print         [place], [place] 08/16/16 [phone]

## 2016-08-18 ENCOUNTER — Ambulatory Visit: Payer: 59

## 2016-08-18 ENCOUNTER — Ambulatory Visit (INDEPENDENT_AMBULATORY_CARE_PROVIDER_SITE_OTHER): Payer: 59 | Admitting: Podiatry

## 2016-08-18 ENCOUNTER — Encounter: Payer: Self-pay | Admitting: Podiatry

## 2016-08-18 VITALS — BP 109/85 | HR 82 | Resp 16

## 2016-08-18 DIAGNOSIS — S92302A Fracture of unspecified metatarsal bone(s), left foot, initial encounter for closed fracture: Secondary | ICD-10-CM

## 2016-08-18 DIAGNOSIS — M79672 Pain in left foot: Secondary | ICD-10-CM

## 2016-08-18 NOTE — Progress Notes (Signed)
Subjective:     Patient ID: Katelyn Reyes, female   DOB: 1960/11/09, 56 y.o.   MRN: [id number]  HPI patient points to left foot stating that she strained it significantly and she has pain in the metatarsals with mild to moderate edema of the second and third metatarsals secondary to injury   Review of Systems     Objective:   Physical Exam Neurovascular status intact negative Homans sign was noted with patient's left foot showing moderate edema and pain when palpated from a dorsal direction with inflammation mostly centered around the second and third metatarsal shaft    Assessment:     Possibility for fracture or stress fracture of the left foot with inability to gait on it in any meaningful way    Plan:     H&P condition reviewed and discussed. At this point I have recommended air fracture walker to immobilize and she will take 2 weeks off work and should be up to return. She'll be reevaluated in 2 weeks and we will re-x-ray at that time

## 2016-09-01 ENCOUNTER — Encounter: Payer: Self-pay | Admitting: Podiatry

## 2016-09-01 ENCOUNTER — Ambulatory Visit (INDEPENDENT_AMBULATORY_CARE_PROVIDER_SITE_OTHER): Payer: 59

## 2016-09-01 ENCOUNTER — Ambulatory Visit (INDEPENDENT_AMBULATORY_CARE_PROVIDER_SITE_OTHER): Payer: 59 | Admitting: Podiatry

## 2016-09-01 DIAGNOSIS — S92302A Fracture of unspecified metatarsal bone(s), left foot, initial encounter for closed fracture: Secondary | ICD-10-CM | POA: Diagnosis not present

## 2016-09-01 NOTE — Progress Notes (Signed)
Subjective:     Patient ID: Katelyn Reyes, female   DOB: 1961/01/24, 56 y.o.   MRN: [id number]  HPI patient states it's improving but it still is sore with the boot helping her quite a bit and allowing her to be ambulatory   Review of Systems     Objective:   Physical Exam Neurovascular status intact with continued edema in the mid arch region left second metatarsal that is still tender when I pressed deeply but improved from previous visit    Assessment:     Probable stress fracture left second metatarsal    Plan:     H&P and x-ray reviewed and today I recommended continued boot usage for the next several weeks with gradual reduction over that time. I discussed ice therapy and also elevation as needed. Reappoint 4 weeks for final x-ray  X-ray report indicates suspicious sign on the mid shaft left second metatarsal lateral side which could indicate probable stress fracture

## 2016-09-11 MED FILL — ZOLPIDEM TARTRATE 10 MG TAB: 10 | 30 days supply | Qty: 30 | Fill #0

## 2016-09-11 MED FILL — ALPRAZolam 0.5 MG TABS: 0.5 | 20 days supply | Qty: 60 | Fill #0

## 2016-09-16 ENCOUNTER — Telehealth: Payer: Self-pay | Admitting: *Deleted

## 2016-09-16 NOTE — Telephone Encounter (Signed)
Katelyn Reyes is calling for the status of the Physician Statement for pt.

## 2016-09-29 ENCOUNTER — Ambulatory Visit (INDEPENDENT_AMBULATORY_CARE_PROVIDER_SITE_OTHER): Payer: 59 | Admitting: Podiatry

## 2016-09-29 ENCOUNTER — Ambulatory Visit (INDEPENDENT_AMBULATORY_CARE_PROVIDER_SITE_OTHER): Payer: 59

## 2016-09-29 DIAGNOSIS — S92302A Fracture of unspecified metatarsal bone(s), left foot, initial encounter for closed fracture: Secondary | ICD-10-CM

## 2016-09-29 DIAGNOSIS — M21619 Bunion of unspecified foot: Secondary | ICD-10-CM | POA: Diagnosis not present

## 2016-09-29 NOTE — Patient Instructions (Signed)
Bunionectomy A bunionectomy is a surgical procedure to remove a bunion. A bunion is a visible bump of bone on the inside of your foot where your big toe meets the rest of your foot. A bunion can develop when pressure turns this bone (first metatarsal) toward the other toes. Shoes that are too tight are the most common cause of bunions. Bunions can also be caused by diseases, such as arthritis and polio. You may need a bunionectomy if your bunion is very large and painful or it affects your ability to walk. Tell a health care provider about:  Any allergies you have.  All medicines you are taking, including vitamins, herbs, eye drops, creams, and over-the-counter medicines.  Any problems you or family members have had with anesthetic medicines.  Any blood disorders you have.  Any surgeries you have had.  Any medical conditions you have. What are the risks? Generally, this is a safe procedure. However, problems may occur, including:  Infection.  Pain.  Nerve damage.  Bleeding or blood clots.  Reactions to medicines.  Numbness, stiffness, or arthritis in your toe.  Foot problems that continue even after the procedure. What happens before the procedure?  Ask your health care provider about:  Changing or stopping your regular medicines. This is especially important if you are taking diabetes medicines or blood thinners.  Taking medicines such as aspirin and ibuprofen. These medicines can thin your blood. Do not take these medicines before your procedure if your health care provider instructs you not to.  Do not drink alcohol before the procedure as directed by your health care provider.  Do not use tobacco products, including cigarettes, chewing tobacco, or electronic cigarettes, before the procedure as directed by your health care provider. If you need help quitting, ask your health care provider.  Ask your health care provider what kind of medicine you will be given during  your procedure. A bunionectomy may be done using one of these:  A medicine that numbs the area (local anesthetic).  A medicine that makes you go to sleep (general anesthetic). If you will be given general anesthetic, do not eat or drink anything after midnight on the night before the procedure or as directed by your health care provider. What happens during the procedure?  An IV tube may be inserted into a vein.  You will be given local anesthetic or general anesthetic.  The surgeon will make a cut (incision) over the enlarged area at the first joint of the big toe. The surgeon will remove the bunion.  You may have more than one incision if any of the bones in your big toe need to be moved. A bone itself may need to be cut.  Sometimes the tissues around the big toe may also need to be cut then tightened or loosened to reposition the toe.  Screws or other hardware may be used to keep your foot in thecorrect position.  The incision will be closed with stitches (sutures) and covered with adhesive strips or another type of bandage (dressing). What happens after the procedure?  You may spend some time in a recovery area.  Your blood pressure, heart rate, breathing rate, and blood oxygen level will be monitored often until the medicines you were given have worn off. This information is not intended to replace advice given to you by your health care provider. Make sure you discuss any questions you have with your health care provider. Document Released: 05/16/2005 Document Revised: 11/08/2015 Document Reviewed: 01/18/2014   Elsevier Interactive Patient Education  2017 [employer]  

## 2016-09-29 NOTE — Progress Notes (Signed)
Subjective:     Patient ID: Katelyn Reyes, female   DOB: 1960-11-19, 56 y.o.   MRN: [id number]  HPI patient states I'm feeling quite a bit better with my left foot with diminished discomfort and able to walk without pain. I know I need to get my bunions corrected and I like to do it later in the year   Review of Systems     Objective:   Physical Exam Neurovascular status intact negative Homans sign noted with reduce discomfort around the second third metatarsal shaft left and structural bunion deformity noted bilateral    Assessment:     Improved from probable stress fracture was structural bunion deformity noted    Plan:     H&P condition reviewed and advised on wider shoes and gradual increase in activity levels and the fact the bunions will need to be fixed long-term. Patient wants surgery and we'll schedule as needed

## 2016-10-20 MED FILL — ZOLPIDEM TARTRATE 10 MG TAB: 10 | 30 days supply | Qty: 30 | Fill #1

## 2016-10-20 MED FILL — FLUTICASONE PROP 50 MCG SPR: 50 | 30 days supply | Qty: 16 | Fill #3

## 2016-10-20 MED FILL — ALPRAZolam 0.5 MG TABS: 0.5 | 20 days supply | Qty: 60 | Fill #1

## 2016-10-21 ENCOUNTER — Other Ambulatory Visit: Payer: Self-pay | Admitting: General Practice

## 2016-10-21 MED ORDER — MONTELUKAST SODIUM 10 MG PO TABS: 10.0000 mg | ORAL_TABLET | Freq: Every day | ORAL | 1 refills | Status: DC

## 2016-10-21 MED ORDER — CETIRIZINE HCL 10 MG PO TABS: 10.0000 mg | ORAL_TABLET | Freq: Every day | ORAL | 1 refills | Status: DC

## 2016-10-21 MED ORDER — SERTRALINE HCL 100 MG PO TABS: 100.0000 mg | ORAL_TABLET | Freq: Every day | ORAL | 1 refills | Status: DC

## 2016-10-21 MED FILL — ALL DAY ALLERGY 10 MG TAB: 10 | 100 days supply | Qty: 100 | Fill #0

## 2016-10-21 MED FILL — SERTRALINE HCL 100 MG TAB: 100 | 90 days supply | Qty: 90 | Fill #0

## 2016-10-21 MED FILL — MONTELUKAST SOD 10 MG TAB: 10 | 90 days supply | Qty: 90 | Fill #0 | Status: TO

## 2016-11-27 DIAGNOSIS — Z6838 Body mass index (BMI) 38.0-38.9, adult: Secondary | ICD-10-CM | POA: Diagnosis not present

## 2016-11-27 DIAGNOSIS — Z01419 Encounter for gynecological examination (general) (routine) without abnormal findings: Secondary | ICD-10-CM | POA: Diagnosis not present

## 2016-11-27 DIAGNOSIS — Z124 Encounter for screening for malignant neoplasm of cervix: Secondary | ICD-10-CM | POA: Diagnosis not present

## 2016-11-27 DIAGNOSIS — N951 Menopausal and female climacteric states: Secondary | ICD-10-CM | POA: Diagnosis not present

## 2016-11-27 LAB — HM PAP SMEAR

## 2016-11-27 MED FILL — ESTRADIOL 1 MG TABLET: 1 | 90 days supply | Qty: 90 | Fill #0 | Status: TO

## 2016-12-05 ENCOUNTER — Encounter: Payer: Self-pay | Admitting: General Practice

## 2016-12-16 DIAGNOSIS — H524 Presbyopia: Secondary | ICD-10-CM | POA: Diagnosis not present

## 2016-12-16 DIAGNOSIS — H52223 Regular astigmatism, bilateral: Secondary | ICD-10-CM | POA: Diagnosis not present

## 2016-12-16 DIAGNOSIS — H5213 Myopia, bilateral: Secondary | ICD-10-CM | POA: Diagnosis not present

## 2016-12-23 MED FILL — ALPRAZolam 0.5 MG TABS: 0.5 | 20 days supply | Qty: 60 | Fill #2

## 2016-12-23 MED FILL — ZOLPIDEM TARTRATE 10 MG TAB: 10 | 30 days supply | Qty: 30 | Fill #2

## 2017-01-30 ENCOUNTER — Other Ambulatory Visit: Payer: Self-pay | Admitting: Family Medicine

## 2017-01-30 MED FILL — SERTRALINE HCL 100 MG TAB: 100 | 90 days supply | Qty: 90 | Fill #1

## 2017-01-30 MED FILL — FLUTICASONE PROP 50 MCG SPR: 50 | 30 days supply | Qty: 16 | Fill #0

## 2017-01-30 MED FILL — ALPRAZolam 0.5 MG TABS: 0.5 | 20 days supply | Qty: 60 | Fill #0

## 2017-01-30 MED FILL — ZOLPIDEM TARTRATE 10 MG TAB: 10 | 30 days supply | Qty: 30 | Fill #0

## 2017-01-30 NOTE — Telephone Encounter (Signed)
Medication filled to pharmacy as requested.   

## 2017-01-30 NOTE — Telephone Encounter (Signed)
Last OV 07/01/16 CPE Alprazolam last filled 07/04/16 #60 with 3 Zolpidem last filled 07/04/16 #30 with 3

## 2017-03-07 LAB — HM MAMMOGRAPHY: HM Mammogram: NORMAL (ref 0–4)

## 2017-03-11 ENCOUNTER — Other Ambulatory Visit: Payer: Self-pay | Admitting: Family Medicine

## 2017-03-11 MED FILL — MONTELUKAST SOD 10 MG TAB: 10 | 90 days supply | Qty: 90 | Fill #1 | Status: TO

## 2017-03-11 MED FILL — ESTRADIOL 1 MG TABLET: 1 | 90 days supply | Qty: 90 | Fill #1 | Status: TO

## 2017-03-17 MED FILL — ZOLPIDEM TARTRATE 10 MG TAB: 10 | 30 days supply | Qty: 30 | Fill #1

## 2017-03-17 MED FILL — ALPRAZolam 0.5 MG TABS: 0.5 | 20 days supply | Qty: 60 | Fill #1

## 2017-03-17 MED FILL — FLUTICASONE PROP 50 MCG SPR: 50 | 30 days supply | Qty: 16 | Fill #1

## 2017-04-28 ENCOUNTER — Ambulatory Visit (INDEPENDENT_AMBULATORY_CARE_PROVIDER_SITE_OTHER): Payer: 59 | Admitting: Physician Assistant

## 2017-04-28 ENCOUNTER — Other Ambulatory Visit: Payer: Self-pay

## 2017-04-28 ENCOUNTER — Ambulatory Visit (HOSPITAL_BASED_OUTPATIENT_CLINIC_OR_DEPARTMENT_OTHER)
Admission: RE | Admit: 2017-04-28 | Discharge: 2017-04-28 | Disposition: A | Discharge status: Home / Self Care | Payer: 59 | Source: Ambulatory Visit | Attending: Physician Assistant | Admitting: Physician Assistant

## 2017-04-28 ENCOUNTER — Encounter: Payer: Self-pay | Admitting: Physician Assistant

## 2017-04-28 VITALS — BP 110/78 | HR 76 | Temp 98.3°F | Resp 14 | Ht 62.0 in | Wt 205.0 lb

## 2017-04-28 DIAGNOSIS — J329 Chronic sinusitis, unspecified: Secondary | ICD-10-CM | POA: Diagnosis not present

## 2017-04-28 DIAGNOSIS — R932 Abnormal findings on diagnostic imaging of liver and biliary tract: Secondary | ICD-10-CM | POA: Diagnosis not present

## 2017-04-28 DIAGNOSIS — R1011 Right upper quadrant pain: Secondary | ICD-10-CM | POA: Insufficient documentation

## 2017-04-28 DIAGNOSIS — B9789 Other viral agents as the cause of diseases classified elsewhere: Secondary | ICD-10-CM

## 2017-04-28 MED ORDER — AZELASTINE HCL 0.1 % NA SOLN: 2.0000 | Freq: Two times a day (BID) | NASAL | 0 refills | Status: DC

## 2017-04-28 NOTE — Progress Notes (Signed)
Pre visit review using our clinic review tool, if applicable. No additional management support is needed unless otherwise documented below in the visit note. 

## 2017-04-28 NOTE — Patient Instructions (Signed)
Please go to the lab today for blood work.  Then go to [facility] for your [place]. I will call you with your results. We will alter treatment regimen(s) if indicated by your results.   Keep a bland diet until we get further assessment.  Tylenol for pain if needed. If anything acutely worsens before assessment is complete, please go to the ER.  We are sorry that you are not feeling well.  Here is how we plan to help!  Based on what you have shared with me it looks like you have sinusitis.  Sinusitis is inflammation and infection in the sinus cavities of the head.  Based on your presentation I believe you most likely have Acute Viral Sinusitis.This is an infection most likely caused by a virus. There is not specific treatment for viral sinusitis other than to help you with the symptoms until the infection runs its course.  You may use an oral decongestant such as Mucinex D or if you have glaucoma or high blood pressure use plain Mucinex. Saline nasal spray help and can safely be used as often as needed for congestion, I have prescribed: Azelastine nasal spray 2 sprays in each nostril twice a day  Some authorities believe that zinc sprays or the use of Echinacea may shorten the course of your symptoms.  Sinus infections are not as easily transmitted as other respiratory infection, however we still recommend that you avoid close contact with loved ones, especially the very young and elderly.  Remember to wash your hands thoroughly throughout the day as this is the number one way to prevent the spread of infection!  Home Care:  Only take medications as instructed by your medical team.  Complete the entire course of an antibiotic.  Do not take these medications with alcohol.  A steam or ultrasonic humidifier can help congestion.  You can place a towel over your head and breathe in the steam from hot water coming from a faucet.  Avoid close contacts especially the very young and the  elderly.  Cover your mouth when you cough or sneeze.  Always remember to wash your hands.  Get Help Right Away If:  You develop worsening fever or sinus pain.  You develop a severe head ache or visual changes.  Your symptoms persist after you have completed your treatment plan.  Make sure you  Understand these instructions.  Will watch your condition.  Will get help right away if you are not doing well or get worse.

## 2017-04-28 NOTE — Progress Notes (Signed)
Patient presents to clinic today c/o intermittent episodes of RUQ pain, occurring about 15-20 minutes after eating. This has been present for the past week. Notes pain may last 5 minutes to an hour each time. Feels like a spasm in the area. Denies radiation elsewhere. Denies chest pain, palpitations or SOB. States she has a prior history of colicky abdominal pain at which time she had a HIDA scan that was inconclusive. Has not had any issue since this (notes this was several years ago).   Patient also notes a few days of nasal congestion with sinus pressure and scratchy throat. Denies fever, chills, sinus pain, ear pain or facial pain. Denies chest congestion or cough. Has been very busy lately with work and other activities.    Past Medical History:  Diagnosis Date  . Allergic rhinitis   . Asthma    related to seasonal allergies  . Breast calcification, right 02/02/2013   Surgically excised 02/17/13 > path benign   . Depression   . Hyperlipidemia   . IUD 02/07/10    Current Outpatient Medications on File Prior to Visit  Medication Sig Dispense Refill  . albuterol (PROVENTIL HFA;VENTOLIN HFA) 108 (90 BASE) MCG/ACT inhaler Inhale 2 puffs into the lungs every 6 (six) hours as needed for wheezing. 1 Inhaler 6  . ALPRAZolam (XANAX) 0.5 MG tablet TAKE 1 TABLET BY MOUTH 3 TIMES DAILY AS NEEDED 60 tablet 3  . cetirizine (ZYRTEC) 10 MG tablet Take 1 tablet (10 mg total) by mouth daily. 90 tablet 1  . estradiol (ESTRACE) 1 MG tablet   0  . fluticasone (FLONASE) 50 MCG/ACT nasal spray PLACE 2 SPRAYS INTO BOTH NOSTRILS DAILY. 16 g 6  . levonorgestrel (MIRENA) 20 MCG/24HR IUD 1 each by Intrauterine route once.      . montelukast (SINGULAIR) 10 MG tablet Take 1 tablet (10 mg total) by mouth daily. 90 tablet 1  . Multiple Vitamin (MULTIVITAMIN WITH MINERALS) TABS tablet Take 1 tablet by mouth daily.    Marland Kitchen oxycodone (OXY-IR) 5 MG capsule Take 1 capsule (5 mg total) by mouth every 4 (four) hours as needed.  15 capsule 0  . sertraline (ZOLOFT) 100 MG tablet TAKE 1 TABLET (100 MG TOTAL) BY MOUTH DAILY. 90 tablet 1  . Vitamin D, Ergocalciferol, (DRISDOL) 50000 units CAPS capsule Take 1 capsule (50,000 Units total) by mouth every 7 (seven) days. 12 capsule 0  . zolpidem (AMBIEN) 10 MG tablet TAKE 1 TABLET BY MOUTH AT BEDTIME AS NEEDED FOR SLEEP 30 tablet 3   No current facility-administered medications on file prior to visit.     Allergies  Allergen Reactions  . Erythromycin Nausea Only  . Hydrocodone-Acetaminophen     REACTION: hallucinations  . Shrimp [Shellfish Allergy] Itching    Scallops as well  . Amoxicillin Rash    Family History  Problem Relation Age of Onset  . Hypertension Father   . Diabetes Father   . Fibrocystic breast disease Mother   . Stroke Maternal Grandmother   . Colon cancer Paternal Grandmother   . Breast cancer Unknown        aunt  . Coronary artery disease Neg Hx     Social History   Socioeconomic History  . Marital status: Single    Spouse name: None  . Number of children: None  . Years of education: None  . Highest education level: None  Social Needs  . Financial resource strain: None  . Food insecurity - worry: None  .  Food insecurity - inability: None  . Transportation needs - medical: None  . Transportation needs - non-medical: None  Occupational History  . None  Tobacco Use  . Smoking status: Never Smoker  . Smokeless tobacco: Never Used  Substance and Sexual Activity  . Alcohol use: No    Comment: social  . Drug use: No  . Sexual activity: Yes    Birth control/protection: IUD  Other Topics Concern  . None  Social History Narrative  . None   Review of Systems - See HPI.  All other ROS are negative.  BP 110/78   Pulse 76   Temp 98.3 F (36.8 C) (Oral)   Resp 14   Ht 5' 2"  (1.575 m)   Wt 205 lb (93 kg)   SpO2 96%   BMI 37.49 kg/m   Physical Exam  Constitutional: She is oriented to person, place, and time and  well-developed, well-nourished, and in no distress.  HENT:  Head: Normocephalic and atraumatic.  Right Ear: External ear normal.  Left Ear: External ear normal.  Nose: Nose normal.  Mouth/Throat: Oropharynx is clear and moist. No oropharyngeal exudate.  Eyes: Conjunctivae are normal.  Neck: Neck supple.  Cardiovascular: Normal rate, regular rhythm, normal heart sounds and intact distal pulses.  Pulmonary/Chest: Effort normal and breath sounds normal. No respiratory distress. She has no wheezes. She has no rales. She exhibits no tenderness.  Abdominal: Soft. Bowel sounds are normal. She exhibits no distension and no mass. There is tenderness in the right upper quadrant. There is no rebound, no guarding and negative Murphy's sign.  Neurological: She is alert and oriented to person, place, and time.  Skin: Skin is warm and dry. No rash noted.  Vitals reviewed.  Assessment/Plan: 1. Colicky RUQ abdominal pain Labs today. [place] ordered for 5:30 today to assess further. Supportive measures reviewed. ER if anything acutely worsens before assessment is complete.  - CBC w/Diff - Comp Met (CMET) - Lipase - US Abdomen Limited RUQ; Future  2. Viral sinusitis Supportive measures reviewed. Start Astelin nasal in addition to current regimen.   Leeanne Rio, PA-C

## 2017-04-29 ENCOUNTER — Other Ambulatory Visit: Payer: Self-pay | Admitting: Physician Assistant

## 2017-04-29 DIAGNOSIS — K8050 Calculus of bile duct without cholangitis or cholecystitis without obstruction: Secondary | ICD-10-CM

## 2017-04-29 LAB — CBC WITH DIFFERENTIAL/PLATELET
Basophils Absolute: 18 cells/uL (ref 0–200)
Basophils Relative: 0.3 %
Eosinophils Absolute: 212 cells/uL (ref 15–500)
Eosinophils Relative: 3.6 %
HCT: 37.3 % (ref 35.0–45.0)
Hemoglobin: 12.9 g/dL (ref 11.7–15.5)
Lymphs Abs: 1741 cells/uL (ref 850–3900)
MCH: 31.6 pg (ref 27.0–33.0)
MCHC: 34.6 g/dL (ref 32.0–36.0)
MCV: 91.4 fL (ref 80.0–100.0)
MPV: 10 fL (ref 7.5–12.5)
Monocytes Relative: 10.7 %
Neutro Abs: 3298 cells/uL (ref 1500–7800)
Neutrophils Relative %: 55.9 %
Platelets: 292 10*3/uL (ref 140–400)
RBC: 4.08 10*6/uL (ref 3.80–5.10)
RDW: 11.9 % (ref 11.0–15.0)
Total Lymphocyte: 29.5 %
WBC mixed population: 631 cells/uL (ref 200–950)
WBC: 5.9 10*3/uL (ref 3.8–10.8)

## 2017-04-29 LAB — COMPREHENSIVE METABOLIC PANEL
AG Ratio: 1.3 (calc) (ref 1.0–2.5)
ALT: 22 U/L (ref 6–29)
AST: 20 U/L (ref 10–35)
Albumin: 4.2 g/dL (ref 3.6–5.1)
Alkaline phosphatase (APISO): 67 U/L (ref 33–130)
BUN: 13 mg/dL (ref 7–25)
CO2: 26 mmol/L (ref 20–32)
Calcium: 9 mg/dL (ref 8.6–10.4)
Chloride: 104 mmol/L (ref 98–110)
Creat: 0.92 mg/dL (ref 0.50–1.05)
Globulin: 3.3 g/dL (calc) (ref 1.9–3.7)
Glucose, Bld: 87 mg/dL (ref 65–99)
Potassium: 4.3 mmol/L (ref 3.5–5.3)
Sodium: 137 mmol/L (ref 135–146)
Total Bilirubin: 0.3 mg/dL (ref 0.2–1.2)
Total Protein: 7.5 g/dL (ref 6.1–8.1)

## 2017-04-29 LAB — LIPASE: Lipase: 29 U/L (ref 7–60)

## 2017-04-30 ENCOUNTER — Other Ambulatory Visit: Payer: Self-pay | Admitting: Gastroenterology

## 2017-04-30 DIAGNOSIS — R112 Nausea with vomiting, unspecified: Secondary | ICD-10-CM

## 2017-04-30 DIAGNOSIS — K760 Fatty (change of) liver, not elsewhere classified: Secondary | ICD-10-CM | POA: Diagnosis not present

## 2017-04-30 DIAGNOSIS — Z86010 Personal history of colonic polyps: Secondary | ICD-10-CM | POA: Diagnosis not present

## 2017-04-30 DIAGNOSIS — R1011 Right upper quadrant pain: Secondary | ICD-10-CM | POA: Diagnosis not present

## 2017-04-30 DIAGNOSIS — K5730 Diverticulosis of large intestine without perforation or abscess without bleeding: Secondary | ICD-10-CM | POA: Diagnosis not present

## 2017-04-30 DIAGNOSIS — R110 Nausea: Secondary | ICD-10-CM | POA: Diagnosis not present

## 2017-05-08 ENCOUNTER — Ambulatory Visit (HOSPITAL_COMMUNITY)
Admission: RE | Admit: 2017-05-08 | Discharge: 2017-05-08 | Disposition: A | Discharge status: Home / Self Care | Payer: 59 | Source: Ambulatory Visit | Attending: Gastroenterology | Admitting: Gastroenterology

## 2017-05-08 DIAGNOSIS — R1013 Epigastric pain: Secondary | ICD-10-CM | POA: Diagnosis not present

## 2017-05-08 DIAGNOSIS — R112 Nausea with vomiting, unspecified: Secondary | ICD-10-CM | POA: Insufficient documentation

## 2017-05-08 DIAGNOSIS — R1011 Right upper quadrant pain: Secondary | ICD-10-CM | POA: Insufficient documentation

## 2017-05-08 MED ORDER — TECHNETIUM TC 99M MEBROFENIN IV KIT
5.0000 | PACK | Freq: Once | INTRAVENOUS | Status: AC | PRN
  Administered 2017-05-08: 5 via INTRAVENOUS

## 2017-06-01 ENCOUNTER — Other Ambulatory Visit: Payer: Self-pay | Admitting: Family Medicine

## 2017-06-01 MED FILL — ZOLPIDEM TARTRATE 10 MG TAB: 10 | 30 days supply | Qty: 30 | Fill #2

## 2017-06-01 MED FILL — ALPRAZolam 0.5 MG TABS: 0.5 | 20 days supply | Qty: 60 | Fill #2

## 2017-06-01 MED FILL — MONTELUKAST SOD 10 MG TAB: 10 | 90 days supply | Qty: 90 | Fill #0 | Status: TO

## 2017-06-01 MED FILL — SERTRALINE HCL 100 MG TAB: 100 | 90 days supply | Qty: 90 | Fill #0 | Status: TO

## 2017-06-01 MED FILL — ESTRADIOL 1 MG TABLET: 1 | 90 days supply | Qty: 90 | Fill #2 | Status: TO

## 2017-06-01 MED FILL — ALL DAY ALLERGY 10 MG TAB: 10 | 80 days supply | Qty: 80 | Fill #1

## 2017-06-01 MED FILL — FLUTICASONE PROP 50 MCG SPR: 50 | 30 days supply | Qty: 16 | Fill #2

## 2017-06-02 DIAGNOSIS — H1045 Other chronic allergic conjunctivitis: Secondary | ICD-10-CM | POA: Diagnosis not present

## 2017-06-02 MED FILL — ZYLET EYE DROPS: 0.5-0.3 | 17 days supply | Qty: 5 | Fill #0

## 2017-06-23 MED FILL — ZYLET EYE DROPS: 0.5-0.3 | 25 days supply | Qty: 5 | Fill #0

## 2017-07-02 ENCOUNTER — Encounter: Payer: 59 | Admitting: Family Medicine

## 2017-07-07 ENCOUNTER — Encounter: Payer: Self-pay | Admitting: Family Medicine

## 2017-07-07 ENCOUNTER — Other Ambulatory Visit: Payer: Self-pay

## 2017-07-07 ENCOUNTER — Ambulatory Visit (INDEPENDENT_AMBULATORY_CARE_PROVIDER_SITE_OTHER): Payer: 59 | Admitting: Family Medicine

## 2017-07-07 VITALS — BP 112/80 | HR 76 | Temp 98.1°F | Resp 16 | Ht 62.0 in | Wt 204.1 lb

## 2017-07-07 DIAGNOSIS — E559 Vitamin D deficiency, unspecified: Secondary | ICD-10-CM

## 2017-07-07 DIAGNOSIS — E785 Hyperlipidemia, unspecified: Secondary | ICD-10-CM | POA: Diagnosis not present

## 2017-07-07 DIAGNOSIS — Z0000 Encounter for general adult medical examination without abnormal findings: Secondary | ICD-10-CM | POA: Diagnosis not present

## 2017-07-07 DIAGNOSIS — E010 Iodine-deficiency related diffuse (endemic) goiter: Secondary | ICD-10-CM | POA: Diagnosis not present

## 2017-07-07 LAB — TSH: TSH: 3.77 u[IU]/mL (ref 0.35–4.50)

## 2017-07-07 LAB — CBC WITH DIFFERENTIAL/PLATELET
Basophils Absolute: 0 10*3/uL (ref 0.0–0.1)
Basophils Relative: 0.4 % (ref 0.0–3.0)
Eosinophils Absolute: 0.1 10*3/uL (ref 0.0–0.7)
Eosinophils Relative: 2.5 % (ref 0.0–5.0)
HCT: 38.3 % (ref 36.0–46.0)
Hemoglobin: 12.7 g/dL (ref 12.0–15.0)
Lymphocytes Relative: 44.4 % (ref 12.0–46.0)
Lymphs Abs: 2.6 10*3/uL (ref 0.7–4.0)
MCHC: 33.2 g/dL (ref 30.0–36.0)
MCV: 94.6 fl (ref 78.0–100.0)
Monocytes Absolute: 0.5 10*3/uL (ref 0.1–1.0)
Monocytes Relative: 8.5 % (ref 3.0–12.0)
Neutro Abs: 2.6 10*3/uL (ref 1.4–7.7)
Neutrophils Relative %: 44.2 % (ref 43.0–77.0)
Platelets: 308 10*3/uL (ref 150.0–400.0)
RBC: 4.05 Mil/uL (ref 3.87–5.11)
RDW: 13.2 % (ref 11.5–15.5)
WBC: 5.9 10*3/uL (ref 4.0–10.5)

## 2017-07-07 LAB — LIPID PANEL
Cholesterol: 178 mg/dL (ref 0–200)
HDL: 51.1 mg/dL (ref >39.00)
LDL Cholesterol: 100 mg/dL — ABNORMAL HIGH (ref 0–99)
NonHDL: 126.55
Total CHOL/HDL Ratio: 3
Triglycerides: 132 mg/dL (ref 0.0–149.0)
VLDL: 26.4 mg/dL (ref 0.0–40.0)

## 2017-07-07 LAB — HEPATIC FUNCTION PANEL
ALT: 23 U/L (ref 0–35)
AST: 24 U/L (ref 0–37)
Albumin: 3.9 g/dL (ref 3.5–5.2)
Alkaline Phosphatase: 61 U/L (ref 39–117)
Bilirubin, Direct: 0.1 mg/dL (ref 0.0–0.3)
Total Bilirubin: 0.5 mg/dL (ref 0.2–1.2)
Total Protein: 6.9 g/dL (ref 6.0–8.3)

## 2017-07-07 LAB — VITAMIN D 25 HYDROXY (VIT D DEFICIENCY, FRACTURES): VITD: 14.76 ng/mL — ABNORMAL LOW (ref 30.00–100.00)

## 2017-07-07 LAB — BASIC METABOLIC PANEL
BUN: 14 mg/dL (ref 6–23)
CO2: 30 mEq/L (ref 19–32)
Calcium: 8.9 mg/dL (ref 8.4–10.5)
Chloride: 104 mEq/L (ref 96–112)
Creatinine, Ser: 0.91 mg/dL (ref 0.40–1.20)
GFR: 82.82 mL/min (ref >60.00)
Glucose, Bld: 98 mg/dL (ref 70–99)
Potassium: 4.2 mEq/L (ref 3.5–5.1)
Sodium: 139 mEq/L (ref 135–145)

## 2017-07-07 MED ORDER — ZOLPIDEM TARTRATE 10 MG PO TABS: 10.0000 mg | ORAL_TABLET | Freq: Every evening | ORAL | 3 refills | Status: DC | PRN

## 2017-07-07 MED ORDER — ALPRAZOLAM 0.5 MG PO TABS: 0.5000 mg | ORAL_TABLET | Freq: Three times a day (TID) | ORAL | 3 refills | Status: DC | PRN

## 2017-07-07 NOTE — Assessment & Plan Note (Signed)
Chronic problem.  Attempting to control w/ diet and exercise.  Check labs.  Adjust tx plan prn. 

## 2017-07-07 NOTE — Progress Notes (Signed)
   Subjective:    Patient ID: Katelyn Reyes, female    DOB: December 01, 1960, 57 y.o.   MRN: [id number]  HPI CPE- UTD on GYN w/ Dr Raphael Gibney, UTD on colonoscopy.  UTD on immunizations.  No concerns today.   Review of Systems Patient reports no vision/ hearing changes, adenopathy,fever, weight change,  persistant/recurrent hoarseness , swallowing issues, chest pain, palpitations, edema, persistant/recurrent cough, hemoptysis, dyspnea (rest/exertional/paroxysmal nocturnal), gastrointestinal bleeding (melena, rectal bleeding), abdominal pain, significant heartburn, bowel changes, GU symptoms (dysuria, hematuria, incontinence), Gyn symptoms (abnormal  bleeding, pain),  syncope, focal weakness, memory loss, numbness & tingling, skin/hair/nail changes, abnormal bruising or bleeding, anxiety, or depression.     Objective:   Physical Exam General Appearance:    Alert, cooperative, no distress, appears stated age  Head:    Normocephalic, without obvious abnormality, atraumatic  Eyes:    PERRL, conjunctiva/corneas clear, EOM's intact, fundi    benign, both eyes  Ears:    Normal TM's and external ear canals, both ears  Nose:   Nares normal, septum midline, mucosa normal, no drainage    or sinus tenderness  Throat:   Lips, mucosa, and tongue normal; teeth and gums normal  Neck:   Supple, symmetrical, trachea midline, no adenopathy;    Thyroid: thyromegaly  Back:     Symmetric, no curvature, ROM normal, no CVA tenderness  Lungs:     Clear to auscultation bilaterally, respirations unlabored  Chest Wall:    No tenderness or deformity   Heart:    Regular rate and rhythm, S1 and S2 normal, no murmur, rub   or gallop  Breast Exam:    Deferred to GYN  Abdomen:     Soft, non-tender, bowel sounds active all four quadrants,    no masses, no organomegaly  Genitalia:    Deferred to GYN  Rectal:    Extremities:   Extremities normal, atraumatic, no cyanosis or edema  Pulses:   2+ and symmetric all extremities    Skin:   Skin color, texture, turgor normal, no rashes or lesions  Lymph nodes:   Cervical, supraclavicular, and axillary nodes normal  Neurologic:   CNII-XII intact, normal strength, sensation and reflexes    throughout          Assessment & Plan:

## 2017-07-07 NOTE — Patient Instructions (Signed)
Follow up in 1 year or as needed We'll notify you of your lab results and make any changes if needed Continue to work on healthy diet and regular exercise- you can do it! We'll call you with your Thyroid US Call with any questions or concerns Happy New Year!!!

## 2017-07-07 NOTE — Assessment & Plan Note (Signed)
Pt's PE WNL w/ exception of weight and known thyromegaly.  UTD on GYN, colonoscopy.  Check labs.  Anticipatory guidance provided.

## 2017-07-07 NOTE — Assessment & Plan Note (Signed)
Pt has hx of this.  Check labs and replete prn. 

## 2017-07-07 NOTE — Assessment & Plan Note (Signed)
Chronic problem.  Pt had [place] in 2013 and thyroid was WNL.  Repeat to determine if there have been any changes.  Will follow.

## 2017-07-08 ENCOUNTER — Other Ambulatory Visit: Payer: Self-pay | Admitting: General Practice

## 2017-07-08 MED ORDER — VITAMIN D (ERGOCALCIFEROL) 1.25 MG (50000 UNIT) PO CAPS: 50000.0000 [IU] | ORAL_CAPSULE | ORAL | 0 refills | Status: DC

## 2017-07-08 MED FILL — VIT D2 1.25 MG (50,000 UNIT: 1.25 MG | 84 days supply | Qty: 12 | Fill #0

## 2017-07-27 DIAGNOSIS — H04123 Dry eye syndrome of bilateral lacrimal glands: Secondary | ICD-10-CM | POA: Diagnosis not present

## 2017-09-02 MED FILL — ALPRAZolam 0.5 MG TABS: 0.5 | 20 days supply | Qty: 60 | Fill #0

## 2017-09-02 MED FILL — ZOLPIDEM TARTRATE 10 MG TAB: 10 | 30 days supply | Qty: 30 | Fill #0

## 2017-09-04 ENCOUNTER — Ambulatory Visit (HOSPITAL_COMMUNITY): Admission: RE | Admit: 2017-09-04 | Payer: 59 | Source: Ambulatory Visit

## 2017-09-11 MED FILL — MONTELUKAST SOD 10 MG TAB: 10 | 90 days supply | Qty: 90 | Fill #1 | Status: TO

## 2017-09-17 ENCOUNTER — Other Ambulatory Visit: Payer: Self-pay | Admitting: Family Medicine

## 2017-09-17 DIAGNOSIS — Z86010 Personal history of colonic polyps: Secondary | ICD-10-CM | POA: Diagnosis not present

## 2017-09-17 DIAGNOSIS — Z800 Family history of malignant neoplasm of digestive organs: Secondary | ICD-10-CM | POA: Diagnosis not present

## 2017-09-17 DIAGNOSIS — K7581 Nonalcoholic steatohepatitis (NASH): Secondary | ICD-10-CM | POA: Diagnosis not present

## 2017-09-17 DIAGNOSIS — R1011 Right upper quadrant pain: Secondary | ICD-10-CM | POA: Diagnosis not present

## 2017-09-17 MED FILL — SERTRALINE HCL 100 MG TAB: 100 | 90 days supply | Qty: 90 | Fill #1 | Status: TO

## 2017-09-17 MED FILL — CETIRIZINE HCL 10 MG TABS: 10 | 90 days supply | Qty: 90 | Fill #0

## 2017-09-23 ENCOUNTER — Encounter: Payer: Self-pay | Admitting: Family Medicine

## 2017-09-23 DIAGNOSIS — R1011 Right upper quadrant pain: Secondary | ICD-10-CM | POA: Diagnosis not present

## 2017-09-23 DIAGNOSIS — K219 Gastro-esophageal reflux disease without esophagitis: Secondary | ICD-10-CM | POA: Diagnosis not present

## 2017-10-05 ENCOUNTER — Other Ambulatory Visit: Payer: Self-pay

## 2017-10-05 ENCOUNTER — Other Ambulatory Visit: Payer: Self-pay | Admitting: General Surgery

## 2017-10-05 ENCOUNTER — Telehealth: Payer: Self-pay | Admitting: Family Medicine

## 2017-10-05 ENCOUNTER — Ambulatory Visit: Payer: Self-pay | Admitting: General Surgery

## 2017-10-05 ENCOUNTER — Ambulatory Visit
Admission: RE | Admit: 2017-10-05 | Discharge: 2017-10-05 | Disposition: A | Discharge status: Home / Self Care | Payer: 59 | Source: Ambulatory Visit | Attending: General Surgery | Admitting: General Surgery

## 2017-10-05 ENCOUNTER — Encounter: Payer: Self-pay | Admitting: Physician Assistant

## 2017-10-05 ENCOUNTER — Encounter (HOSPITAL_COMMUNITY): Payer: Self-pay

## 2017-10-05 ENCOUNTER — Ambulatory Visit: Payer: 59 | Admitting: Physician Assistant

## 2017-10-05 VITALS — BP 110/70 | HR 77 | Temp 98.2°F | Resp 16 | Ht 62.0 in | Wt 205.0 lb

## 2017-10-05 DIAGNOSIS — R1011 Right upper quadrant pain: Secondary | ICD-10-CM

## 2017-10-05 DIAGNOSIS — K828 Other specified diseases of gallbladder: Secondary | ICD-10-CM | POA: Diagnosis not present

## 2017-10-05 DIAGNOSIS — M25511 Pain in right shoulder: Secondary | ICD-10-CM

## 2017-10-05 DIAGNOSIS — K2960 Other gastritis without bleeding: Secondary | ICD-10-CM | POA: Diagnosis not present

## 2017-10-05 MED ORDER — IOPAMIDOL (ISOVUE-300) INJECTION 61%
100.0000 mL | Freq: Once | INTRAVENOUS | Status: AC | PRN
  Administered 2017-10-05: 100 mL via INTRAVENOUS

## 2017-10-05 MED ORDER — METAXALONE 400 MG PO TABS: 400.0000 mg | ORAL_TABLET | Freq: Three times a day (TID) | ORAL | 0 refills | Status: DC

## 2017-10-05 MED ORDER — MELOXICAM 15 MG PO TABS: 15.0000 mg | ORAL_TABLET | Freq: Every day | ORAL | 0 refills | Status: DC

## 2017-10-05 MED FILL — ESTRADIOL 1 MG TABLET: 1 | 90 days supply | Qty: 90 | Fill #3 | Status: TO

## 2017-10-05 MED FILL — MELOXICAM 15 MG TABLET: 15 | 15 days supply | Qty: 15 | Fill #0

## 2017-10-05 MED FILL — oxyCODONE HCL 5 MG TABS: 5 | 6 days supply | Qty: 20 | Fill #0

## 2017-10-05 MED FILL — FAMOTIDINE 20 MG TABLET: 20 | 15 days supply | Qty: 30 | Fill #0

## 2017-10-05 MED FILL — METAXALONE 800 MG TABLET: 800 | 7 days supply | Qty: 10 | Fill #0

## 2017-10-05 NOTE — Telephone Encounter (Signed)
Ok to take 1/2 tablet of 800 mg tablet per dose.  Change quantity to 10

## 2017-10-05 NOTE — Pre-Procedure Instructions (Signed)
Katelyn Reyes  10/05/2017      [place], [place] - [address] [facility] [address] [place] [address] Phone: [phone] Fax: [place], [place] [address] [place] [address] Phone: [phone] Fax: [phone]    Your procedure is scheduled on 10-09-2017 Friday   Report to [facility] Admitting at 10:15 A.M.   Call this number if you have problems the morning of surgery:  [phone]   Remember:  Do not eat food or drink liquids after midnight.   Take these medicines the morning of surgery with A SIP OF WATER  Albuterol inhaler if needed(Bring inhaler with you) Alprazolam(Xanax) Estradioll(Estrace) Famotidine(Pepcid) Flonase nasal spray Montelukast(Singular) Oxycodone if needed Sertraline(Zoloft) STOP TAKING ANY ASPIRIN(UNLESS OTHERWISE INSTRUCTED BY YOUR SURGEON),ANTIINFLAMATORIES (IBUPROFEN,ALEVE,MOTRIN,ADVIL,GOODY'S POWDERS),HERBAL SUPPLEMENTS,FISH OIL,AND VITAMINS 5-7 DAYS PRIOR TO SURGERY    Do not wear jewelry, make-up or nail polish.  Do not wear lotions, powders, or perfumes, or deodorant.  Do not shave 48 hours prior to surgery.  Men may shave face and neck.  Do not bring valuables to the hospital.  [facility] is not responsible for any belongings or valuables.  Contacts, dentures or bridgework may not be worn into surgery.  Leave your suitcase in the car.  After surgery it may be brought to your room.  For patients admitted to the hospital, discharge time will be determined by your treatment team.  Patients discharged the day of surgery will not be allowed to drive home.    Special Instructions: [facility] - Preparing for Surgery  Before surgery, you can play an important role.  Because skin is not sterile, your skin needs to be as free of germs as possible.  You can reduce the number of  germs on you skin by washing with CHG (chlorahexidine gluconate) soap before surgery.  CHG is an antiseptic cleaner which kills germs and bonds with the skin to continue killing germs even after washing.  Please DO NOT use if you have an allergy to CHG or antibacterial soaps.  If your skin becomes reddened/irritated stop using the CHG and inform your nurse when you arrive at Short Stay.  Do not shave (including legs and underarms) for at least 48 hours prior to the first CHG shower.  You may shave your face.  Please follow these instructions carefully:   1.  Shower with CHG Soap the night before surgery and the   morning of Surgery.  2.  If you choose to wash your hair, wash your hair first as usual with your normal shampoo.  3.  After you shampoo, rinse your hair and body thoroughly to remove the  Shampoo.  4.  Use CHG as you would any other liquid soap.  You can apply chg directly  to the skin and wash gently with scrungie or a clean washcloth.  5.  Apply the CHG Soap to your body ONLY FROM THE NECK DOWN.   Do not use on open wounds or open sores.  Avoid contact with your eyes,  ears, mouth and genitals (private parts).  Wash genitals (private parts) with your normal soap.  6.  Wash thoroughly, paying special attention to the area where your surgery will be performed.  7.  Thoroughly rinse your body with warm water from the neck down.  8.  DO NOT shower/wash with your normal  soap after using and rinsing o  the CHG Soap.  9.  Pat yourself dry with a clean towel.            10.  Wear clean pajamas.            11.  Place clean sheets on your bed the night of your first shower and do not sleep with pets.  Day of Surgery  Do not apply any lotions/deodorants the morning of surgery.  Please wear clean clothes to the hospital/surgery center.   Please read over the following fact sheets that you were given. Coughing and Deep Breathing and Surgical Site Infection Prevention

## 2017-10-05 NOTE — Telephone Encounter (Signed)
Spoke with pharmacy to verify.   Okay to do 800MG  tablet, take 1/2 tablet per dose.  Decrease Quantity to 10 tablets. Pharmacy stated verbal understanding and will fill for patient.

## 2017-10-05 NOTE — Telephone Encounter (Signed)
Copied from [place] [id number]. Topic: Quick Communication - Rx Refill/Question >> Oct 05, 2017  3:52 PM Margot Ables wrote: metaxalone [facility]) 400 MG tablet   Pharmacy only has 800mg  tablet. It is scored and pt can cut in half. Is it ok to dispense to take 1/2 pill per dose? Should qty of pills be changed?

## 2017-10-05 NOTE — Progress Notes (Signed)
Patient presents to clinic today c/o R shoulder pain x 1 week. Notes pain is constant but worsened with range of motion. Denies radiation of pain into arm. Denies numbness/tingling. Denies trauma or injury. Is scheduled for gallbladder removal this Friday. Thought these symptoms could be related so she had follow-up with surgeon this morning who felt the issue was stemming from her shoulder and recommended assessment here in PCP office. Has taken Oxycodone given by surgeon with decent relief of symptoms.   Past Medical History:  Diagnosis Date  . Allergic rhinitis   . Asthma    related to seasonal allergies  . Breast calcification, right 02/02/2013   Surgically excised 02/17/13 > path benign   . Depression   . Hyperlipidemia   . IUD 02/07/10    Current Outpatient Medications on File Prior to Visit  Medication Sig Dispense Refill  . albuterol (PROVENTIL HFA;VENTOLIN HFA) 108 (90 BASE) MCG/ACT inhaler Inhale 2 puffs into the lungs every 6 (six) hours as needed for wheezing. 1 Inhaler 6  . ALPRAZolam (XANAX) 0.5 MG tablet Take 1 tablet (0.5 mg total) by mouth 3 (three) times daily as needed. (Patient taking differently: Take 0.5 mg by mouth 3 (three) times daily as needed for anxiety. ) 60 tablet 3  . cetirizine (ZYRTEC) 10 MG tablet Take 10 mg by mouth daily.    . Cholecalciferol (VITAMIN D3) 2000 units TABS Take 2,000 Units by mouth daily.    Marland Kitchen estradiol (ESTRACE) 1 MG tablet Take 1 mg by mouth daily.   0  . famotidine (PEPCID) 20 MG tablet Take 20 mg by mouth daily.    . fluticasone (FLONASE) 50 MCG/ACT nasal spray PLACE 2 SPRAYS INTO BOTH NOSTRILS DAILY. 16 g 6  . levonorgestrel (MIRENA) 20 MCG/24HR IUD 1 each by Intrauterine route once.      . montelukast (SINGULAIR) 10 MG tablet TAKE 1 TABLET (10 MG TOTAL) BY MOUTH DAILY. 90 tablet 1  . Multiple Vitamin (MULTIVITAMIN WITH MINERALS) TABS tablet Take 1 tablet by mouth daily.    Marland Kitchen oxyCODONE (OXY IR/ROXICODONE) 5 MG immediate release tablet  Take 5 mg by mouth 3 (three) times daily as needed for severe pain.    Marland Kitchen sertraline (ZOLOFT) 100 MG tablet TAKE 1 TABLET (100 MG TOTAL) BY MOUTH DAILY. 90 tablet 1  . zolpidem (AMBIEN) 10 MG tablet Take 1 tablet (10 mg total) by mouth at bedtime as needed. for sleep (Patient taking differently: Take 10 mg by mouth at bedtime as needed for sleep. ) 30 tablet 3   No current facility-administered medications on file prior to visit.     Allergies  Allergen Reactions  . Erythromycin Nausea Only  . Hydrocodone-Acetaminophen Other (See Comments)    REACTION: hallucinations  . Shrimp [Shellfish Allergy] Itching    Scallops as well  . Amoxicillin Rash and Other (See Comments)    Has patient had a PCN reaction causing immediate rash, facial/tongue/throat swelling, SOB or lightheadedness with hypotension: No Has patient had a PCN reaction causing severe rash involving mucus membranes or skin necrosis: No Has patient had a PCN reaction that required hospitalization: No - MD office Has patient had a PCN reaction occurring within the last 10 years: Yes If all of the above answers are "NO", then may proceed with Cephalosporin use.     Family History  Problem Relation Age of Onset  . Hypertension Father   . Diabetes Father   . Fibrocystic breast disease Mother   . Stroke Maternal  Grandmother   . Colon cancer Paternal Grandmother   . Breast cancer Unknown        aunt  . Coronary artery disease Neg Hx     Social History   Socioeconomic History  . Marital status: Single    Spouse name: Not on file  . Number of children: Not on file  . Years of education: Not on file  . Highest education level: Not on file  Occupational History  . Not on file  Social Needs  . Financial resource strain: Not on file  . Food insecurity:    Worry: Not on file    Inability: Not on file  . Transportation needs:    Medical: Not on file    Non-medical: Not on file  Tobacco Use  . Smoking status: Never  Smoker  . Smokeless tobacco: Never Used  Substance and Sexual Activity  . Alcohol use: No    Comment: social  . Drug use: No  . Sexual activity: Yes    Birth control/protection: IUD  Lifestyle  . Physical activity:    Days per week: Not on file    Minutes per session: Not on file  . Stress: Not on file  Relationships  . Social connections:    Talks on phone: Not on file    Gets together: Not on file    Attends religious service: Not on file    Active member of club or organization: Not on file    Attends meetings of clubs or organizations: Not on file    Relationship status: Not on file  Other Topics Concern  . Not on file  Social History Narrative  . Not on file   Review of Systems - See HPI.  All other ROS are negative.  BP 110/70   Pulse 77   Temp 98.2 F (36.8 C) (Oral)   Resp 16   Ht 5\' 2"  (1.575 m)   Wt 205 lb (93 kg)   SpO2 96%   BMI 37.49 kg/m   Physical Exam  Constitutional: She appears well-developed and well-nourished.  HENT:  Head: Normocephalic and atraumatic.  Neck: Neck supple.  Cardiovascular: Normal rate, regular rhythm and normal heart sounds.  Pulmonary/Chest: Effort normal.  Musculoskeletal:       Right shoulder: She exhibits decreased range of motion (secondary to pain only. FROM present. ), tenderness and pain. She exhibits no bony tenderness, no spasm and normal strength.       Cervical back: Normal.  Skin: Skin is warm.   Assessment/Plan: 1. Acute pain of right shoulder Start meloxicam and skelaxin. No heavy lifting or overhead lifting. Supportive measures and OTC medications reviewed. Follow-up with surgeon Friday as scheduled.  - meloxicam (MOBIC) 15 MG tablet; Take 1 tablet (15 mg total) by mouth daily.  Dispense: 15 tablet; Refill: 0 - metaxalone (SKELAXIN) 400 MG tablet; Take 1 tablet (400 mg total) by mouth 3 (three) times daily.  Dispense: 15 tablet; Refill: 0   Leeanne Rio, PA-C

## 2017-10-05 NOTE — Patient Instructions (Signed)
Please avoid heavy lifting or overexertion. Take the Meloxicam once daily with food. Oxycodone for severe breakthrough pain. Try the Skelaxin to calm the muscles down this evening. Apply a heating pad to the area in 10-15 minute intervals. There seems to be inflammation in the rotator cuff muscles that we need to calm down.   Please follow-up with Dr. Hulen Skains as scheduled for your procedure Friday.

## 2017-10-06 ENCOUNTER — Telehealth: Payer: Self-pay | Admitting: Family Medicine

## 2017-10-06 ENCOUNTER — Encounter (HOSPITAL_COMMUNITY): Payer: Self-pay

## 2017-10-06 ENCOUNTER — Encounter (HOSPITAL_COMMUNITY)
Admission: RE | Admit: 2017-10-06 | Discharge: 2017-10-06 | Disposition: A | Discharge status: Home / Self Care | Payer: 59 | Source: Ambulatory Visit | Attending: General Surgery | Admitting: General Surgery

## 2017-10-06 DIAGNOSIS — F17200 Nicotine dependence, unspecified, uncomplicated: Secondary | ICD-10-CM | POA: Diagnosis not present

## 2017-10-06 DIAGNOSIS — E669 Obesity, unspecified: Secondary | ICD-10-CM | POA: Diagnosis not present

## 2017-10-06 DIAGNOSIS — K811 Chronic cholecystitis: Secondary | ICD-10-CM | POA: Diagnosis not present

## 2017-10-06 DIAGNOSIS — Z79890 Hormone replacement therapy (postmenopausal): Secondary | ICD-10-CM | POA: Diagnosis not present

## 2017-10-06 DIAGNOSIS — K828 Other specified diseases of gallbladder: Secondary | ICD-10-CM | POA: Diagnosis not present

## 2017-10-06 DIAGNOSIS — Z6837 Body mass index (BMI) 37.0-37.9, adult: Secondary | ICD-10-CM | POA: Diagnosis not present

## 2017-10-06 DIAGNOSIS — K219 Gastro-esophageal reflux disease without esophagitis: Secondary | ICD-10-CM | POA: Diagnosis not present

## 2017-10-06 DIAGNOSIS — Z79899 Other long term (current) drug therapy: Secondary | ICD-10-CM | POA: Diagnosis not present

## 2017-10-06 DIAGNOSIS — Z791 Long term (current) use of non-steroidal anti-inflammatories (NSAID): Secondary | ICD-10-CM | POA: Diagnosis not present

## 2017-10-06 DIAGNOSIS — Z975 Presence of (intrauterine) contraceptive device: Secondary | ICD-10-CM | POA: Diagnosis not present

## 2017-10-06 DIAGNOSIS — F419 Anxiety disorder, unspecified: Secondary | ICD-10-CM | POA: Diagnosis not present

## 2017-10-06 DIAGNOSIS — Z91018 Allergy to other foods: Secondary | ICD-10-CM | POA: Diagnosis not present

## 2017-10-06 DIAGNOSIS — Q2547 Right aortic arch: Secondary | ICD-10-CM | POA: Diagnosis not present

## 2017-10-06 DIAGNOSIS — Z7951 Long term (current) use of inhaled steroids: Secondary | ICD-10-CM | POA: Diagnosis not present

## 2017-10-06 DIAGNOSIS — K2960 Other gastritis without bleeding: Secondary | ICD-10-CM | POA: Diagnosis not present

## 2017-10-06 DIAGNOSIS — Z880 Allergy status to penicillin: Secondary | ICD-10-CM | POA: Diagnosis not present

## 2017-10-06 DIAGNOSIS — J45909 Unspecified asthma, uncomplicated: Secondary | ICD-10-CM | POA: Diagnosis not present

## 2017-10-06 DIAGNOSIS — Z885 Allergy status to narcotic agent status: Secondary | ICD-10-CM | POA: Diagnosis not present

## 2017-10-06 DIAGNOSIS — F329 Major depressive disorder, single episode, unspecified: Secondary | ICD-10-CM | POA: Diagnosis not present

## 2017-10-06 DIAGNOSIS — Z01818 Encounter for other preprocedural examination: Secondary | ICD-10-CM

## 2017-10-06 HISTORY — DX: Gastro-esophageal reflux disease without esophagitis: K21.9

## 2017-10-06 HISTORY — DX: Anxiety disorder, unspecified: F41.9

## 2017-10-06 LAB — COMPREHENSIVE METABOLIC PANEL
ALT: 30 U/L (ref 14–54)
AST: 31 U/L (ref 15–41)
Albumin: 3.8 g/dL (ref 3.5–5.0)
Alkaline Phosphatase: 67 U/L (ref 38–126)
Anion gap: 8 (ref 5–15)
BUN: 12 mg/dL (ref 6–20)
CO2: 25 mmol/L (ref 22–32)
Calcium: 8.9 mg/dL (ref 8.9–10.3)
Chloride: 104 mmol/L (ref 101–111)
Creatinine, Ser: 0.97 mg/dL (ref 0.44–1.00)
GFR calc Af Amer: >60 mL/min (ref >60)
GFR calc non Af Amer: >60 mL/min (ref >60)
Glucose, Bld: 114 mg/dL — ABNORMAL HIGH (ref 65–99)
Potassium: 4.2 mmol/L (ref 3.5–5.1)
Sodium: 137 mmol/L (ref 135–145)
Total Bilirubin: 0.4 mg/dL (ref 0.3–1.2)
Total Protein: 7.1 g/dL (ref 6.5–8.1)

## 2017-10-06 LAB — CBC WITH DIFFERENTIAL/PLATELET
Basophils Absolute: 0 10*3/uL (ref 0.0–0.1)
Basophils Relative: 0 %
Eosinophils Absolute: 0.2 10*3/uL (ref 0.0–0.7)
Eosinophils Relative: 4 %
HCT: 38.7 % (ref 36.0–46.0)
Hemoglobin: 12.8 g/dL (ref 12.0–15.0)
Lymphocytes Relative: 52 %
Lymphs Abs: 3 10*3/uL (ref 0.7–4.0)
MCH: 31.3 pg (ref 26.0–34.0)
MCHC: 33.1 g/dL (ref 30.0–36.0)
MCV: 94.6 fL (ref 78.0–100.0)
Monocytes Absolute: 0.3 10*3/uL (ref 0.1–1.0)
Monocytes Relative: 5 %
Neutro Abs: 2.2 10*3/uL (ref 1.7–7.7)
Neutrophils Relative %: 39 %
Platelets: 303 10*3/uL (ref 150–400)
RBC: 4.09 MIL/uL (ref 3.87–5.11)
RDW: 12.4 % (ref 11.5–15.5)
WBC: 5.7 10*3/uL (ref 4.0–10.5)

## 2017-10-06 NOTE — Telephone Encounter (Signed)
Pt called back and given information per notes of Bennetta Laos

## 2017-10-06 NOTE — Progress Notes (Signed)
Never seen by cardiologist  Denies any cardiac testing   PCP Annye Asa  MD

## 2017-10-06 NOTE — Telephone Encounter (Signed)
Pt was seen by Bennetta Laos on 10/05/17 for right shoulder pain. Pt has been taking Skelaxin 400 mg 3 times a day, Meloxicam and Oxycodone 5mg , which was ordered by another provider and has been using heat to the shoulder as well and is still experiencing a lot of pain. Pt states pain never goes away and the pain starts out as a 10 and with a medication pain decreases to 7. Pt asking for further instructions on what to do to control pain.

## 2017-10-06 NOTE — Telephone Encounter (Signed)
LM to inform patient of notes below from provider.  Detailed information was given.  If patient returns call, okay for PEC to discuss.  CRM created

## 2017-10-06 NOTE — Telephone Encounter (Signed)
If pain is that severe despite the amount of medication she taking, I would recommend that she go to the ER for assessment as they can give her something stronger to calm this down and obtain further studies if needed.

## 2017-10-07 ENCOUNTER — Telehealth: Payer: Self-pay | Admitting: Family Medicine

## 2017-10-07 DIAGNOSIS — Z0279 Encounter for issue of other medical certificate: Secondary | ICD-10-CM

## 2017-10-07 NOTE — Telephone Encounter (Signed)
Received FLMA paperwork, via fax from [employer]. Placed in front bin with charge sheet.

## 2017-10-08 NOTE — Telephone Encounter (Signed)
Paperwork is in PCP folder for completion.

## 2017-10-09 ENCOUNTER — Encounter (HOSPITAL_COMMUNITY)
Admission: RE | Disposition: A | Discharge status: Home / Self Care | Payer: Self-pay | Source: Ambulatory Visit | Attending: General Surgery

## 2017-10-09 ENCOUNTER — Ambulatory Visit (HOSPITAL_COMMUNITY): Payer: 59

## 2017-10-09 ENCOUNTER — Ambulatory Visit (HOSPITAL_COMMUNITY): Payer: 59 | Admitting: Certified Registered Nurse Anesthetist

## 2017-10-09 ENCOUNTER — Encounter (HOSPITAL_COMMUNITY): Payer: Self-pay

## 2017-10-09 ENCOUNTER — Ambulatory Visit (HOSPITAL_COMMUNITY)
Admission: RE | Admit: 2017-10-09 | Discharge: 2017-10-09 | Disposition: A | Discharge status: Home / Self Care | Payer: 59 | Source: Ambulatory Visit | Attending: General Surgery | Admitting: General Surgery

## 2017-10-09 DIAGNOSIS — Z975 Presence of (intrauterine) contraceptive device: Secondary | ICD-10-CM | POA: Insufficient documentation

## 2017-10-09 DIAGNOSIS — F419 Anxiety disorder, unspecified: Secondary | ICD-10-CM | POA: Diagnosis not present

## 2017-10-09 DIAGNOSIS — F329 Major depressive disorder, single episode, unspecified: Secondary | ICD-10-CM | POA: Diagnosis not present

## 2017-10-09 DIAGNOSIS — Z79890 Hormone replacement therapy (postmenopausal): Secondary | ICD-10-CM | POA: Diagnosis not present

## 2017-10-09 DIAGNOSIS — Z880 Allergy status to penicillin: Secondary | ICD-10-CM | POA: Insufficient documentation

## 2017-10-09 DIAGNOSIS — Z885 Allergy status to narcotic agent status: Secondary | ICD-10-CM | POA: Insufficient documentation

## 2017-10-09 DIAGNOSIS — K811 Chronic cholecystitis: Secondary | ICD-10-CM | POA: Diagnosis not present

## 2017-10-09 DIAGNOSIS — J45909 Unspecified asthma, uncomplicated: Secondary | ICD-10-CM | POA: Insufficient documentation

## 2017-10-09 DIAGNOSIS — E785 Hyperlipidemia, unspecified: Secondary | ICD-10-CM | POA: Diagnosis not present

## 2017-10-09 DIAGNOSIS — Z7951 Long term (current) use of inhaled steroids: Secondary | ICD-10-CM | POA: Insufficient documentation

## 2017-10-09 DIAGNOSIS — E559 Vitamin D deficiency, unspecified: Secondary | ICD-10-CM | POA: Diagnosis not present

## 2017-10-09 DIAGNOSIS — Z419 Encounter for procedure for purposes other than remedying health state, unspecified: Secondary | ICD-10-CM

## 2017-10-09 DIAGNOSIS — Z6837 Body mass index (BMI) 37.0-37.9, adult: Secondary | ICD-10-CM | POA: Insufficient documentation

## 2017-10-09 DIAGNOSIS — E669 Obesity, unspecified: Secondary | ICD-10-CM | POA: Insufficient documentation

## 2017-10-09 DIAGNOSIS — K219 Gastro-esophageal reflux disease without esophagitis: Secondary | ICD-10-CM | POA: Diagnosis not present

## 2017-10-09 DIAGNOSIS — F17200 Nicotine dependence, unspecified, uncomplicated: Secondary | ICD-10-CM | POA: Insufficient documentation

## 2017-10-09 DIAGNOSIS — Z91018 Allergy to other foods: Secondary | ICD-10-CM | POA: Insufficient documentation

## 2017-10-09 DIAGNOSIS — J309 Allergic rhinitis, unspecified: Secondary | ICD-10-CM | POA: Diagnosis not present

## 2017-10-09 DIAGNOSIS — K828 Other specified diseases of gallbladder: Secondary | ICD-10-CM | POA: Insufficient documentation

## 2017-10-09 DIAGNOSIS — Z79899 Other long term (current) drug therapy: Secondary | ICD-10-CM | POA: Insufficient documentation

## 2017-10-09 DIAGNOSIS — Z791 Long term (current) use of non-steroidal anti-inflammatories (NSAID): Secondary | ICD-10-CM | POA: Insufficient documentation

## 2017-10-09 DIAGNOSIS — K2960 Other gastritis without bleeding: Secondary | ICD-10-CM | POA: Insufficient documentation

## 2017-10-09 HISTORY — PX: CHOLECYSTECTOMY: SHX55

## 2017-10-09 LAB — POCT PREGNANCY, URINE: Preg Test, Ur: NEGATIVE

## 2017-10-09 SURGERY — LAPAROSCOPIC CHOLECYSTECTOMY WITH INTRAOPERATIVE CHOLANGIOGRAM
Anesthesia: General | Site: Abdomen

## 2017-10-09 MED ORDER — HYDROMORPHONE HCL 1 MG/ML IJ SOLN
INTRAMUSCULAR | Status: AC
  Filled 2017-10-09: qty 0.5

## 2017-10-09 MED ORDER — HYDROMORPHONE HCL 2 MG/ML IJ SOLN
0.3000 mg | INTRAMUSCULAR | Status: DC | PRN
  Administered 2017-10-09: 0.5 mg via INTRAVENOUS

## 2017-10-09 MED ORDER — HYDROMORPHONE HCL 2 MG/ML IJ SOLN
INTRAMUSCULAR | Status: AC
  Filled 2017-10-09: qty 1

## 2017-10-09 MED ORDER — SUCCINYLCHOLINE CHLORIDE 200 MG/10ML IV SOSY
PREFILLED_SYRINGE | INTRAVENOUS | Status: AC
  Filled 2017-10-09: qty 10

## 2017-10-09 MED ORDER — OXYCODONE HCL 5 MG/5ML PO SOLN: 5.0000 mg | Freq: Once | ORAL | Status: AC | PRN

## 2017-10-09 MED ORDER — ONDANSETRON HCL 4 MG/2ML IJ SOLN
INTRAMUSCULAR | Status: AC
  Filled 2017-10-09: qty 2

## 2017-10-09 MED ORDER — MIDAZOLAM HCL 2 MG/2ML IJ SOLN
INTRAMUSCULAR | Status: AC
  Filled 2017-10-09: qty 2

## 2017-10-09 MED ORDER — BUPIVACAINE-EPINEPHRINE 0.25% -1:200000 IJ SOLN
INTRAMUSCULAR | Status: DC | PRN
  Administered 2017-10-09: 19 mL

## 2017-10-09 MED ORDER — SUGAMMADEX SODIUM 200 MG/2ML IV SOLN
INTRAVENOUS | Status: DC | PRN
  Administered 2017-10-09: 184.2 mg via INTRAVENOUS

## 2017-10-09 MED ORDER — CIPROFLOXACIN IN D5W 400 MG/200ML IV SOLN
INTRAVENOUS | Status: AC
  Filled 2017-10-09: qty 200

## 2017-10-09 MED ORDER — ROCURONIUM BROMIDE 100 MG/10ML IV SOLN
INTRAVENOUS | Status: DC | PRN
  Administered 2017-10-09: 50 mg via INTRAVENOUS

## 2017-10-09 MED ORDER — LIDOCAINE 2% (20 MG/ML) 5 ML SYRINGE
INTRAMUSCULAR | Status: AC
  Filled 2017-10-09: qty 5

## 2017-10-09 MED ORDER — 0.9 % SODIUM CHLORIDE (POUR BTL) OPTIME
TOPICAL | Status: DC | PRN
  Administered 2017-10-09: 1000 mL

## 2017-10-09 MED ORDER — PHENYLEPHRINE 40 MCG/ML (10ML) SYRINGE FOR IV PUSH (FOR BLOOD PRESSURE SUPPORT)
PREFILLED_SYRINGE | INTRAVENOUS | Status: AC
  Filled 2017-10-09: qty 10

## 2017-10-09 MED ORDER — CHLORHEXIDINE GLUCONATE CLOTH 2 % EX PADS: 6.0000 | MEDICATED_PAD | Freq: Once | CUTANEOUS | Status: DC

## 2017-10-09 MED ORDER — SODIUM CHLORIDE 0.9 % IR SOLN
Status: DC | PRN
  Administered 2017-10-09: 1000 mL

## 2017-10-09 MED ORDER — OXYCODONE HCL 5 MG PO TABS: 5.0000 mg | ORAL_TABLET | ORAL | 0 refills | Status: DC | PRN

## 2017-10-09 MED ORDER — MIDAZOLAM HCL 5 MG/5ML IJ SOLN
INTRAMUSCULAR | Status: DC | PRN
  Administered 2017-10-09: 2 mg via INTRAVENOUS

## 2017-10-09 MED ORDER — ROCURONIUM BROMIDE 10 MG/ML (PF) SYRINGE
PREFILLED_SYRINGE | INTRAVENOUS | Status: AC
  Filled 2017-10-09: qty 5

## 2017-10-09 MED ORDER — ONDANSETRON HCL 4 MG/2ML IJ SOLN
INTRAMUSCULAR | Status: DC | PRN
  Administered 2017-10-09: 4 mg via INTRAVENOUS

## 2017-10-09 MED ORDER — PROPOFOL 10 MG/ML IV BOLUS
INTRAVENOUS | Status: AC
  Filled 2017-10-09: qty 20

## 2017-10-09 MED ORDER — CIPROFLOXACIN IN D5W 400 MG/200ML IV SOLN
400.0000 mg | INTRAVENOUS | Status: AC
  Administered 2017-10-09: 400 mg via INTRAVENOUS

## 2017-10-09 MED ORDER — MEPERIDINE HCL 50 MG/ML IJ SOLN: 6.2500 mg | INTRAMUSCULAR | Status: DC | PRN

## 2017-10-09 MED ORDER — SODIUM CHLORIDE 0.9 % IV SOLN
INTRAVENOUS | Status: DC | PRN
  Administered 2017-10-09: 4 mL

## 2017-10-09 MED ORDER — PHENYLEPHRINE HCL 10 MG/ML IJ SOLN
INTRAMUSCULAR | Status: DC | PRN
  Administered 2017-10-09 (×4): 80 ug via INTRAVENOUS

## 2017-10-09 MED ORDER — PROMETHAZINE HCL 25 MG/ML IJ SOLN: 6.2500 mg | INTRAMUSCULAR | Status: DC | PRN

## 2017-10-09 MED ORDER — HYDROMORPHONE HCL 1 MG/ML IJ SOLN
INTRAMUSCULAR | Status: DC | PRN
  Administered 2017-10-09: 0.5 mg via INTRAVENOUS

## 2017-10-09 MED ORDER — LIDOCAINE HCL (CARDIAC) PF 100 MG/5ML IV SOSY
PREFILLED_SYRINGE | INTRAVENOUS | Status: DC | PRN
  Administered 2017-10-09: 100 mg via INTRAVENOUS

## 2017-10-09 MED ORDER — IOPAMIDOL (ISOVUE-300) INJECTION 61%
INTRAVENOUS | Status: AC
  Filled 2017-10-09: qty 50

## 2017-10-09 MED ORDER — FENTANYL CITRATE (PF) 250 MCG/5ML IJ SOLN
INTRAMUSCULAR | Status: AC
  Filled 2017-10-09: qty 5

## 2017-10-09 MED ORDER — BUPIVACAINE-EPINEPHRINE (PF) 0.25% -1:200000 IJ SOLN
INTRAMUSCULAR | Status: AC
  Filled 2017-10-09: qty 30

## 2017-10-09 MED ORDER — LACTATED RINGERS IV SOLN
INTRAVENOUS | Status: DC
  Administered 2017-10-09: 11:00:00 via INTRAVENOUS

## 2017-10-09 MED ORDER — OXYCODONE HCL 5 MG PO TABS
ORAL_TABLET | ORAL | Status: AC
  Filled 2017-10-09: qty 1

## 2017-10-09 MED ORDER — OXYCODONE HCL 5 MG PO TABS
5.0000 mg | ORAL_TABLET | Freq: Once | ORAL | Status: AC | PRN
  Administered 2017-10-09: 5 mg via ORAL

## 2017-10-09 MED ORDER — KETOROLAC TROMETHAMINE 30 MG/ML IJ SOLN
INTRAMUSCULAR | Status: AC
  Filled 2017-10-09: qty 1

## 2017-10-09 MED ORDER — DEXAMETHASONE SODIUM PHOSPHATE 4 MG/ML IJ SOLN
INTRAMUSCULAR | Status: DC | PRN
  Administered 2017-10-09: 5 mg via INTRAVENOUS

## 2017-10-09 MED ORDER — DEXAMETHASONE SODIUM PHOSPHATE 10 MG/ML IJ SOLN
INTRAMUSCULAR | Status: AC
  Filled 2017-10-09: qty 1

## 2017-10-09 MED ORDER — SUGAMMADEX SODIUM 200 MG/2ML IV SOLN
INTRAVENOUS | Status: AC
  Filled 2017-10-09: qty 2

## 2017-10-09 MED ORDER — FENTANYL CITRATE (PF) 250 MCG/5ML IJ SOLN
INTRAMUSCULAR | Status: DC | PRN
  Administered 2017-10-09: 50 ug via INTRAVENOUS
  Administered 2017-10-09: 100 ug via INTRAVENOUS
  Administered 2017-10-09 (×2): 50 ug via INTRAVENOUS

## 2017-10-09 MED ORDER — PROPOFOL 10 MG/ML IV BOLUS
INTRAVENOUS | Status: DC | PRN
  Administered 2017-10-09: 160 mg via INTRAVENOUS

## 2017-10-09 MED FILL — oxyCODONE HCL 5 MG TABS: 5 | 3 days supply | Qty: 30 | Fill #0

## 2017-10-09 SURGICAL SUPPLY — 41 items
APPLIER CLIP 5 13 M/L LIGAMAX5 (MISCELLANEOUS) ×2
BLADE CLIPPER SURG (BLADE) IMPLANT
CANISTER SUCT 3000ML PPV (MISCELLANEOUS) ×2 IMPLANT
CHLORAPREP W/TINT 26ML (MISCELLANEOUS) ×2 IMPLANT
CLIP APPLIE 5 13 M/L LIGAMAX5 (MISCELLANEOUS) ×1 IMPLANT
CLSR STERI-STRIP ANTIMIC 1/2X4 (GAUZE/BANDAGES/DRESSINGS) ×2 IMPLANT
COVER MAYO STAND STRL (DRAPES) IMPLANT
COVER SURGICAL LIGHT HANDLE (MISCELLANEOUS) ×2 IMPLANT
DERMABOND ADVANCED (GAUZE/BANDAGES/DRESSINGS) ×1
DERMABOND ADVANCED .7 DNX12 (GAUZE/BANDAGES/DRESSINGS) ×1 IMPLANT
DRAPE C-ARM 42X72 X-RAY (DRAPES) IMPLANT
DRSG TEGADERM 2-3/8X2-3/4 SM (GAUZE/BANDAGES/DRESSINGS) ×2 IMPLANT
ELECT REM PT RETURN 9FT ADLT (ELECTROSURGICAL) ×2
ELECTRODE REM PT RTRN 9FT ADLT (ELECTROSURGICAL) ×1 IMPLANT
GLOVE BIOGEL PI IND STRL 7.5 (GLOVE) ×1 IMPLANT
GLOVE BIOGEL PI IND STRL 8 (GLOVE) ×1 IMPLANT
GLOVE BIOGEL PI INDICATOR 7.5 (GLOVE) ×1
GLOVE BIOGEL PI INDICATOR 8 (GLOVE) ×1
GLOVE ECLIPSE 7.5 STRL STRAW (GLOVE) ×2 IMPLANT
GOWN STRL REUS W/ TWL LRG LVL3 (GOWN DISPOSABLE) ×3 IMPLANT
GOWN STRL REUS W/TWL LRG LVL3 (GOWN DISPOSABLE) ×3
KIT BASIN OR (CUSTOM PROCEDURE TRAY) ×2 IMPLANT
KIT TURNOVER KIT B (KITS) ×2 IMPLANT
NS IRRIG 1000ML POUR BTL (IV SOLUTION) ×2 IMPLANT
PAD ARMBOARD 7.5X6 YLW CONV (MISCELLANEOUS) ×2 IMPLANT
POUCH RETRIEVAL ECOSAC 10 (ENDOMECHANICALS) ×1 IMPLANT
POUCH RETRIEVAL ECOSAC 10MM (ENDOMECHANICALS) ×1
SCISSORS LAP 5X35 DISP (ENDOMECHANICALS) ×2 IMPLANT
SET CHOLANGIOGRAPH 5 50 .035 (SET/KITS/TRAYS/PACK) IMPLANT
SET IRRIG TUBING LAPAROSCOPIC (IRRIGATION / IRRIGATOR) ×2 IMPLANT
SLEEVE ENDOPATH XCEL 5M (ENDOMECHANICALS) ×4 IMPLANT
SPECIMEN JAR SMALL (MISCELLANEOUS) ×2 IMPLANT
STRIP CLOSURE SKIN 1/2X4 (GAUZE/BANDAGES/DRESSINGS) ×2 IMPLANT
SUT MNCRL AB 4-0 PS2 18 (SUTURE) ×4 IMPLANT
TOWEL OR 17X24 6PK STRL BLUE (TOWEL DISPOSABLE) ×2 IMPLANT
TOWEL OR 17X26 10 PK STRL BLUE (TOWEL DISPOSABLE) ×2 IMPLANT
TRAY LAPAROSCOPIC MC (CUSTOM PROCEDURE TRAY) ×2 IMPLANT
TROCAR XCEL BLUNT TIP 100MML (ENDOMECHANICALS) ×2 IMPLANT
TROCAR XCEL NON-BLD 5MMX100MML (ENDOMECHANICALS) ×2 IMPLANT
TUBING INSUFFLATION (TUBING) ×2 IMPLANT
WATER STERILE IRR 1000ML POUR (IV SOLUTION) ×2 IMPLANT

## 2017-10-09 NOTE — Anesthesia Postprocedure Evaluation (Signed)
Anesthesia Post Note  Patient: Katelyn Reyes  Procedure(s) Performed: LAPAROSCOPIC CHOLECYSTECTOMY WITH INTRAOPERATIVE CHOLANGIOGRAM (N/A Abdomen)     Patient location during evaluation: PACU Anesthesia Type: General Level of consciousness: awake and alert Pain management: pain level controlled Vital Signs Assessment: post-procedure vital signs reviewed and stable Respiratory status: spontaneous breathing, nonlabored ventilation and respiratory function stable Cardiovascular status: blood pressure returned to baseline and stable Postop Assessment: no apparent nausea or vomiting Anesthetic complications: no    Last Vitals:  Vitals:   10/09/17 1500 10/09/17 1515  BP: 118/77 116/74  Pulse: 74 72  Resp: 18 18  Temp:  36.7 C  SpO2: 94% 94%    Last Pain:  Vitals:   10/09/17 1515  TempSrc:   PainSc: 3                  Lynda Rainwater

## 2017-10-09 NOTE — Discharge Instructions (Addendum)
Laparoscopic Cholecystectomy, Care After This sheet gives you information about how to care for yourself after your procedure. Your doctor may also give you more specific instructions. If you have problems or questions, contact your doctor. Follow these instructions at home: Care for cuts from surgery (incisions)   Follow instructions from your doctor about how to take care of your cuts from surgery. Make sure you: ? Wash your hands with soap and water before you change your bandage (dressing). If you cannot use soap and water, use hand sanitizer. ? Change your bandage as told by your doctor. ? Leave stitches (sutures), skin glue, or skin tape (adhesive) strips in place. They may need to stay in place for 2 weeks or longer. If tape strips get loose and curl up, you may trim the loose edges. Do not remove tape strips completely unless your doctor says it is okay.  Do not take baths, swim, or use a hot tub until your doctor says it is okay. Ask your doctor if you can take showers. You may only be allowed to take sponge baths for bathing.  Check your surgical cut area every day for signs of infection. Check for: ? More redness, swelling, or pain. ? More fluid or blood. ? Warmth. ? Pus or a bad smell. Activity  Do not drive or use heavy machinery while taking prescription pain medicine.  Do not lift anything that is heavier than 10 lb (4.5 kg) until your doctor says it is okay.  Do not play contact sports until your doctor says it is okay.  Do not drive for 24 hours if you were given a medicine to help you relax (sedative).  Rest as needed. Do not return to work or school until your doctor says it is okay. General instructions  Take over-the-counter and prescription medicines only as told by your doctor.  To prevent or treat constipation while you are taking prescription pain medicine, your doctor may recommend that you: ? Drink enough fluid to keep your pee (urine) clear or pale  yellow. ? Take over-the-counter or prescription medicines. ? Eat foods that are high in fiber, such as fresh fruits and vegetables, whole grains, and beans. ? Limit foods that are high in fat and processed sugars, such as fried and sweet foods. ? Leave surgical dressing intact until sen in clinic Contact a doctor if:  You develop a rash.  You have more redness, swelling, or pain around your surgical cuts.  You have more fluid or blood coming from your surgical cuts.  Your surgical cuts feel warm to the touch.  You have pus or a bad smell coming from your surgical cuts.  You have a fever.  One or more of your surgical cuts breaks open. Get help right away if:  You have trouble breathing.  You have chest pain.  You have pain that is getting worse in your shoulders.  You faint or feel dizzy when you stand.  You have very bad pain in your belly (abdomen).  You are sick to your stomach (nauseous) for more than one day.  You have throwing up (vomiting) that lasts for more than one day.  You have leg pain. This information is not intended to replace advice given to you by your health care provider. Make sure you discuss any questions you have with your health care provider.  Kathryne Eriksson. Dahlia Bailiff, MD, [place] [phone] [facility]

## 2017-10-09 NOTE — Anesthesia Preprocedure Evaluation (Signed)
Anesthesia Evaluation  Patient identified by MRN, date of birth, ID band Patient awake    Reviewed: Allergy & Precautions, H&P , NPO status , Patient's Chart, lab work & pertinent test results  Airway Mallampati: II  TM Distance: >3 FB Neck ROM: full    Dental no notable dental hx. (+) Teeth Intact   Pulmonary asthma ,    Pulmonary exam normal breath sounds clear to auscultation       Cardiovascular negative cardio ROS Normal cardiovascular exam Rhythm:Regular Rate:Normal     Neuro/Psych Anxiety Depression negative neurological ROS     GI/Hepatic negative GI ROS, Neg liver ROS, GERD  ,  Endo/Other  negative endocrine ROS  Renal/GU negative Renal ROS     Musculoskeletal   Abdominal Normal abdominal exam  (+) + obese,   Peds  Hematology negative hematology ROS (+)   Anesthesia Other Findings   Reproductive/Obstetrics negative OB ROS                             Anesthesia Physical  Anesthesia Plan  ASA: II  Anesthesia Plan: General   Post-op Pain Management:    Induction: Intravenous  PONV Risk Score and Plan: 3 and Ondansetron, Dexamethasone and Midazolam  Airway Management Planned: Oral ETT  Additional Equipment:   Intra-op Plan:   Post-operative Plan: Extubation in OR  Informed Consent: I have reviewed the patients History and Physical, chart, labs and discussed the procedure including the risks, benefits and alternatives for the proposed anesthesia with the patient or authorized representative who has indicated his/her understanding and acceptance.   Dental advisory given  Plan Discussed with: CRNA and Surgeon  Anesthesia Plan Comments:         Anesthesia Quick Evaluation

## 2017-10-09 NOTE — H&P (Signed)
Katelyn Reyes Documented: 10/05/2017 9:15 AM Location: [facility] Patient #: [id number] DOB: 19-May-1961 Single / Language: Katelyn Reyes / Race: Black or African American Female   History of Present Illness Katelyn Reyes. Hulen Skains MD; 10/05/2017 9:38 AM) The patient is a 57 year old female who presents with non-malignant abdominal pain. The diagnosis was made several weeks ago based on subjective information. The patient has been previously evaluated by a specialty provider Physiological scientist). Past evaluation has included liver function tests (Including HIDA scan), abdominal ultrasound, upper GI endoscopy and gastroenterology evaluation. Past treatment has included antacids, H2 blockers, opioid analgesics and antidepressants. The patient denies having prior procedures. Recent intervention included stopping fatty foods and modified diet. The pain was not diminished by modified diet. Since the last pain clinic visit the patient was seen by a specialty provider. Pain scores include a current pain level of 10 /10 and an average pain level of 10 /10. The pain is located in the right upper quadrant, in the right lower quadrant and on the right side more than the left (including her right shoulder). The pain radiates to the upper back (right sholder area). The patient describes the pain as aching and throbbing. Symptoms are exacerbated by eating.   Past Surgical History Katelyn Lorenzo, LPN; 1/88/4166 0:63 AM) Breast Biopsy  Right. multiple Oral Surgery   Diagnostic Studies History Katelyn Lorenzo, LPN; 0/16/0109 3:23 AM) Colonoscopy  1-5 years ago Mammogram  1-3 years ago Pap Smear  1-5 years ago  Allergies Katelyn Lorenzo, LPN; 5/57/3220 2:54 AM) Erythromycin *MACROLIDES*  Nausea. Hydrocodone-Acetaminophen *ANALGESICS - OPIOID*  hallucinations Shrimp  Itching. Amoxicillin *PENICILLINS*  Rash. Allergies Reconciled   Medication History Katelyn Lorenzo, LPN; 2/70/6237 6:28 AM) Ambien  (10MG  Tablet, Oral) Active. Vitamin D (2000UNIT Capsule, Oral) Active. Zoloft (100MG  Tablet, Oral) Active. Multivitamin Adults (Oral) Active. Singulair (10MG  Tablet, Oral) Active. Mirena (52 MG) (20MCG/24HR IUD, Intrauterine) Active. Flonase (50MCG/DOSE Inhaler, Nasal) Active. Estrace (1MG  Tablet, Oral) Active. ZyrTEC Allergy (10MG  Tablet, Oral) Active. Albuterol Sulfate HFA (108 (90 Base)MCG/ACT Aerosol Soln, Inhalation) Active. Medications Reconciled  Social History Katelyn Lorenzo, LPN; 08/29/1759 6:07 AM) Alcohol use  Occasional alcohol use. Caffeine use  Carbonated beverages, Coffee, Tea. No drug use  Tobacco use  Current some day smoker.  Family History Katelyn Lorenzo, LPN; 3/71/0626 9:48 AM) Hypertension  Mother. Respiratory Condition  Son.  Pregnancy / Birth History Katelyn Lorenzo, LPN; 5/46/2703 5:00 AM) Age at menarche  69 years. Contraceptive History  Intrauterine device. Gravida  2 Irregular periods  Maternal age  87-30 Para  1  Other Problems Katelyn Lorenzo, LPN; 9/38/1829 9:37 AM) Anxiety Disorder  Asthma  Gastric Ulcer  Gastroesophageal Reflux Disease     Review of Systems Katelyn Billings Dockery LPN; 1/69/6789 3:81 AM) General Not Present- Appetite Loss, Chills, Fatigue, Fever, Night Sweats, Weight Gain and Weight Loss. Skin Not Present- Change in Wart/Mole, Dryness, Hives, Jaundice, New Lesions, Non-Healing Wounds, Rash and Ulcer. HEENT Present- Seasonal Allergies and Wears glasses/contact lenses. Not Present- Earache, Hearing Loss, Hoarseness, Nose Bleed, Oral Ulcers, Ringing in the Ears, Sinus Pain, Sore Throat, Visual Disturbances and Yellow Eyes. Respiratory Present- Snoring. Not Present- Bloody sputum, Chronic Cough, Difficulty Breathing and Wheezing. Breast Not Present- Breast Mass, Breast Pain, Nipple Discharge and Skin Changes. Cardiovascular Not Present- Chest Pain, Difficulty Breathing Lying Down, Leg Cramps, Palpitations, Rapid  Heart Rate, Shortness of Breath and Swelling of Extremities. Gastrointestinal Present- Abdominal Pain. Not Present- Bloating, Bloody Stool, Change in Bowel Habits, Chronic diarrhea, Constipation, Difficulty Swallowing, Excessive  gas, Gets full quickly at meals, Hemorrhoids, Indigestion, Nausea, Rectal Pain and Vomiting. Female Genitourinary Not Present- Frequency, Nocturia, Painful Urination, Pelvic Pain and Urgency. Musculoskeletal Present- Back Pain. Not Present- Joint Pain, Joint Stiffness, Muscle Pain, Muscle Weakness and Swelling of Extremities. Neurological Not Present- Decreased Memory, Fainting, Headaches, Numbness, Seizures, Tingling, Tremor, Trouble walking and Weakness. Psychiatric Not Present- Anxiety, Bipolar, Change in Sleep Pattern, Depression, Fearful and Frequent crying. Endocrine Not Present- Cold Intolerance, Excessive Hunger, Hair Changes, Heat Intolerance, Hot flashes and New Diabetes. Hematology Not Present- Blood Thinners, Easy Bruising, Excessive bleeding, Gland problems, HIV and Persistent Infections.  Vitals Katelyn Billings Dockery LPN; 12/09/6387 3:73 AM) 10/05/2017 9:16 AM Weight: 203.8 lb Height: 63in Body Surface Area: 1.95 m Body Mass Index: 36.1 kg/m  Temp.: 51F(Temporal)  Pulse: 112 (Regular)  BP: 126/82 (Sitting, Right Arm, Standard)       Physical Exam (Keondre Markson O. Hulen Skains MD; 10/05/2017 9:41 AM) General Mental Status-Alert. General Appearance-Cooperative, In acute distress, Restless and Well groomed. Orientation-Oriented X4. Build & Nutrition-Obese(moderately overweight). Posture-Normal posture. Gait-Normal.  Chest and Lung Exam Chest and lung exam reveals -normal excursion with symmetric chest walls, non-tender and normal tactile fremitus and on auscultation, normal breath sounds, no adventitious sounds and normal vocal resonance.  Cardiovascular Cardiovascular examination reveals -on palpation PMI is normal in location and  amplitude, no palpable S3 or S4. Normal cardiac borders., normal heart sounds, regular rate and rhythm with no murmurs(Mild tachycardia), (see Vital Signs section for blood pressure measurements) and femoral artery auscultation bilaterally reveals normal pulses, no bruits, no thrills.  Abdomen Inspection Inspection of the abdomen reveals - No Visible peristalsis, No Hernias and Incision healing well without signs of infections or seroma. Palpation/Percussion Tenderness - Right Lower Quadrant(No palpable masses) and Right Upper Quadrant. Auscultation Auscultation of the abdomen reveals - Bowel sounds normal.    Assessment & Plan Jeneen Rinks O. Hulen Skains MD; 10/05/2017 9:49 AM) RUQ PAIN (R10.11) Story: This has been going on for over a year, but worse in the last week. Now she is miserable. Impression: Objectively there is little evidence that she has acute cholcystitis, biliary colic or gallbladder disease, but her symptoms could be from biliary dyskinesia or chronic cholecystitis. Because part of her symptoms and tenderness in in the RLQ, I do beleive that a CT scan of the abdomen and pelvis is warranted prior to proceeding with Lap chole. Will try to get that done ASAP, and if negative will proceed with lap chole with IOC on Friday. This will be diagnostic and therapeutic. Current Plans Pt Education - Pamphlet Given - Laparoscopic Gallbladder Surgery: discussed with patient and provided information. BILIARY DYSKINESIA (K82.8) Current Plans Started OxyCODONE HCl 5 MG Oral Capsule, 1 (one) Capsule three times daily, as needed, #20, 10/05/2017, No Refill. REFLUX GASTRITIS (K29.60) Current Plans:  LFTs are normal but will consider IOC if duct is not too small and the procedure not too difficult. 50/50 chance of improvement with surgery. Will talk with family after procedure.  Katelyn Reyes. Dahlia Bailiff, MD, [place] [phone] [facility]

## 2017-10-09 NOTE — Anesthesia Procedure Notes (Signed)
Procedure Name: Intubation Date/Time: 10/09/2017 12:17 PM Performed by: Lavell Luster, CRNA Pre-anesthesia Checklist: Patient identified, Emergency Drugs available, Suction available, Patient being monitored and Timeout performed Patient Re-evaluated:Patient Re-evaluated prior to induction Oxygen Delivery Method: Circle system utilized Preoxygenation: Pre-oxygenation with 100% oxygen Induction Type: IV induction Ventilation: Mask ventilation without difficulty Laryngoscope Size: Mac and 4 Grade View: Grade I Tube type: Oral Tube size: 7.0 mm Number of attempts: 1 Airway Equipment and Method: Stylet Placement Confirmation: ETT inserted through vocal cords under direct vision,  positive ETCO2 and breath sounds checked- equal and bilateral Secured at: 21 cm Tube secured with: Tape Dental Injury: Teeth and Oropharynx as per pre-operative assessment

## 2017-10-09 NOTE — Op Note (Signed)
OPERATIVE REPORT  DATE OF OPERATION: 10/09/2017  PATIENT:  Katelyn Reyes  57 y.o. female  PRE-OPERATIVE DIAGNOSIS:  Biliary Dyskinesia  POST-OPERATIVE DIAGNOSIS:  Biliary Dyskinesia  INDICATION(S) FOR OPERATION:  RUQ pain, nausea and discomfort  FINDINGS:  Distended gallbladder.  No obvious stones.  IOC normal  PROCEDURE:  Procedure(s): LAPAROSCOPIC CHOLECYSTECTOMY WITH INTRAOPERATIVE CHOLANGIOGRAM  SURGEON:  Surgeon(s): Judeth Horn, MD  ASSISTANTDeon Pilling, RNFA  ANESTHESIA:   general  COMPLICATIONS:  None  EBL: <20 ml  BLOOD ADMINISTERED: none  DRAINS: none   SPECIMEN:  Source of Specimen:  Gallbladder and contents  COUNTS CORRECT:  YES  PROCEDURE DETAILS: The patient was taken to the operating room and placed on the table in the supine position.  After an adequate endotracheal anesthetic was administered, the patient was prepped with ChloroPrep, and then draped in the usual manner exposing the entire abdomen laterally, inferiorly and up  to the costal margins.  After a proper timeout was performed including identifying the patient and the procedure to be performed, a supraumbilical 5.4OE midline incision was made using a #15 blade.  This was taken down to the fascia which was then incised with a #15 blade.  The edges of the fascia were tented up with Kocher clamps as the preperitoneal space was penetrated with a Kelly clamp into the peritoneum.  Once this was done, a pursestring suture of 0 Vicryl was passed around the fascial opening.  This was subsequently used to secure the [facility] cannula which was passed into the peritoneal cavity.  Once the [facility] cannula was in place, carbon dioxide gas was insufflated into the peritoneal cavity up to a maximal intra-abdominal pressure of 42mm Hg.The laparoscope, with attached camera and light source, was passed into the peritoneal cavity to visualize the direct insertion of two right upper quadrant 68mm cannulas, and a sup-xiphoid  28mm cannula.  Once all cannulas were in place, the dissection was begun.  Two ratcheted graspers were attached to the dome and infundibulum of the gallbladder and retracted towards the anterior abdominal wall and the right upper quadrant.  Using cautery attached to a dissecting forceps, the peritoneum overlaying the triangle of Chalot and the hepatoduodenal triangle was dissected away exposing the cystic duct and the cystic artery.  A critical window was developed between the CBD and the cystic duct The cystic artery was clipped proximally and distally then transected.  A clip was placed on the gallbladder side of the cystic duct, then a cholecystodochotomy made using the laparoscopic scissors.  Through the cholecystodochotomy a Cook catheter was passed to performed a cholangiogram.  The cholangiogram showed tenting of the duct, no extravasation, no intraductal defects, no dilatation, good flow into the duodenum, and good proximal filling..  Once the cholangiogram was completed, the [facility] catheter was removed, and the distal cystic duct was clipped three times then transected between the clips.  The gallbladder was then dissected out of the hepatic bed without event.  It was retrieved from the abdomen using an EcoPouch without problemst.  Once the gallbladder was removed, the bed was inspected for hemostasis.  Once excellent hemostasis was obtained all gas and fluids were aspirated from above the liver, then the cannulas were removed.  The supraumbilical incision was closed using the pursestring suture which was in place.  0.25% bupivicaine with epinephrine was injected at all sites.  All 12mm or greater cannula sites were close using a running subcuticular stitch of 4-0 Monocryl.  5.5mm cannula sites were closed with  Dermabond only.Steri-Strips and Tagaderm were used to complete the dressings at all sites.  At this point all needle, sponge, and instrument counts were correct.The patient was awakened from  anesthesia and taken to the PACU in stable condition.      Kathryne Eriksson. Dahlia Bailiff, MD, [place] [phone] [place] [facility]  PATIENT DISPOSITION:  PACU - hemodynamically stable.   Judeth Horn 4/26/20191:37 PM

## 2017-10-09 NOTE — Transfer of Care (Signed)
Immediate Anesthesia Transfer of Care Note  Patient: Katelyn Reyes  Procedure(s) Performed: LAPAROSCOPIC CHOLECYSTECTOMY WITH INTRAOPERATIVE CHOLANGIOGRAM (N/A Abdomen)  Patient Location: PACU  Anesthesia Type:General  Level of Consciousness: awake, alert , oriented, patient cooperative and responds to stimulation  Airway & Oxygen Therapy: Patient Spontanous Breathing and Patient connected to nasal cannula oxygen  Post-op Assessment: Report given to RN, Post -op Vital signs reviewed and stable and Patient moving all extremities X 4  Post vital signs: Reviewed and stable  Last Vitals:  Vitals Value Taken Time  BP    Temp    Pulse 76 10/09/2017  2:08 PM  Resp 19 10/09/2017  2:08 PM  SpO2 100 % 10/09/2017  2:08 PM  Vitals shown include unvalidated device data.  Last Pain:  Vitals:   10/09/17 1034  TempSrc:   PainSc: 5       Patients Stated Pain Goal: 3 (71/06/26 9485)  Complications: No apparent anesthesia complications

## 2017-10-10 ENCOUNTER — Encounter (HOSPITAL_COMMUNITY): Payer: Self-pay | Admitting: General Surgery

## 2017-10-11 NOTE — Telephone Encounter (Signed)
If this is in regards to her gallbladder surgery, it will need to come from the surgeon's office

## 2017-10-12 NOTE — Telephone Encounter (Signed)
Routing to Jessica

## 2017-10-12 NOTE — Telephone Encounter (Signed)
Correction of Error. Routing to C. Hassell Done.

## 2017-10-12 NOTE — Telephone Encounter (Signed)
Per conversation with pt, FMLA paperwork is in reference to the Right Shoulder Pain. She was seen by C. Hassell Done 4/22 for this. Asked that when complete, the paperwork be faxed back to [employer] and that we make a copy for the pt, calling her to let her know when it is ready for pick up.

## 2017-10-12 NOTE — Telephone Encounter (Signed)
FMLA completed. [employer] faxed

## 2017-10-12 NOTE — Telephone Encounter (Signed)
Can we ask pt if this is in regards to Gallbladder?

## 2017-10-12 NOTE — Telephone Encounter (Signed)
Katelyn Reyes - please find out what dates patient was absent, if any.

## 2017-10-12 NOTE — Telephone Encounter (Signed)
Apologies. To clarify; per conversation with the patient, she was only absent from work the one day that she was seen in the office by C. Martin for the Right Shoulder pain.

## 2017-10-12 NOTE — Telephone Encounter (Signed)
Received a second copy of the FMLA paperwork from [employer], via fax.

## 2017-10-14 ENCOUNTER — Encounter: Payer: Self-pay | Admitting: Family Medicine

## 2017-10-14 ENCOUNTER — Ambulatory Visit: Payer: 59 | Admitting: Physician Assistant

## 2017-10-14 ENCOUNTER — Ambulatory Visit: Payer: 59 | Admitting: Family Medicine

## 2017-10-14 ENCOUNTER — Other Ambulatory Visit: Payer: Self-pay

## 2017-10-14 ENCOUNTER — Ambulatory Visit (INDEPENDENT_AMBULATORY_CARE_PROVIDER_SITE_OTHER): Payer: 59

## 2017-10-14 VITALS — BP 114/80 | HR 100 | Temp 98.2°F | Resp 17 | Ht 62.0 in | Wt 199.6 lb

## 2017-10-14 DIAGNOSIS — M25511 Pain in right shoulder: Secondary | ICD-10-CM

## 2017-10-14 DIAGNOSIS — M50323 Other cervical disc degeneration at C6-C7 level: Secondary | ICD-10-CM | POA: Diagnosis not present

## 2017-10-14 DIAGNOSIS — M5412 Radiculopathy, cervical region: Secondary | ICD-10-CM | POA: Diagnosis not present

## 2017-10-14 DIAGNOSIS — Z9049 Acquired absence of other specified parts of digestive tract: Secondary | ICD-10-CM | POA: Diagnosis not present

## 2017-10-14 MED ORDER — GABAPENTIN 100 MG PO CAPS: ORAL_CAPSULE | ORAL | 0 refills | Status: DC

## 2017-10-14 MED ORDER — KETOROLAC TROMETHAMINE 60 MG/2ML IM SOLN
60.0000 mg | Freq: Once | INTRAMUSCULAR | Status: AC
  Administered 2017-10-14: 60 mg via INTRAMUSCULAR

## 2017-10-14 MED ORDER — PREDNISONE 10 MG PO TABS: ORAL_TABLET | ORAL | 0 refills | Status: DC

## 2017-10-14 MED FILL — predniSONE 10 MG TABS: 10 | 9 days supply | Qty: 21 | Fill #0

## 2017-10-14 MED FILL — GABAPENTIN 100 MG CAP: 100 | 30 days supply | Qty: 90 | Fill #0

## 2017-10-14 NOTE — Patient Instructions (Addendum)
Follow up in 1 week with Dr. Birdie Riddle OR Dr. Jonni Sanger.  Orders for x-ray have been placed, please go to the [place] office for these. -We will let you know your results.   Start the Prednisone - this was sent to the pharmacy for you.    Cervical Radiculopathy Cervical radiculopathy means that a nerve in the neck is pinched or bruised. This can cause pain or loss of feeling (numbness) that runs from your neck to your arm and fingers. Follow these instructions at home: Managing pain  Take over-the-counter and prescription medicines only as told by your doctor.  If directed, put ice on the injured or painful area. ? Put ice in a plastic bag. ? Place a towel between your skin and the bag. ? Leave the ice on for 20 minutes, 2-3 times per day.  If ice does not help, you can try using heat. Take a warm shower or warm bath, or use a heat pack as told by your doctor.  You may try a gentle neck and shoulder massage. Activity  Rest as needed. Follow instructions from your doctor about any activities to avoid.  Do exercises as told by your doctor or physical therapist. General instructions  If you were given a soft collar, wear it as told by your doctor.  Use a flat pillow when you sleep.  Keep all follow-up visits as told by your doctor. This is important. Contact a doctor if:  Your condition does not improve with treatment. Get help right away if:  Your pain gets worse and is not controlled with medicine.  You lose feeling or feel weak in your hand, arm, face, or leg.  You have a fever.  You have a stiff neck.  You cannot control when you poop or pee (have incontinence).  You have trouble with walking, balance, or talking. This information is not intended to replace advice given to you by your health care provider. Make sure you discuss any questions you have with your health care provider. Document Released: 05/22/2011 Document Revised: 11/08/2015 Document Reviewed:  07/27/2014 Elsevier Interactive Patient Education  [employer].

## 2017-10-14 NOTE — Progress Notes (Signed)
Subjective  CC:  Chief Complaint  Patient presents with  . Shoulder Pain    Right shoulder pain, has been happening for several weeks and pain and tightness  radiates down arm into right hand, numbness and tingling    HPI: Katelyn Reyes is a 57 y.o. female who presents to the office today to address the problems listed above in the chief complaint.  [name] is here for persistent pain. I have reviewed recent notes. Last here 2 weeks ago for right shoulder pain.   Reports started having RUQ pain and right shoulder pain weeks ago. Evaluation revealed possible biliary etiology: had cholecystectomy last week. However, still with left "shoulder pain". Reports pain in upper right back, neck "pops", pain with mvt, shooting pain down right arm into index finger with "feelings of going to sleep" in the arm. Pain is constant and unrelieved by narcotics, nsaids and mm relaxers. Interfering with sleep. Had massage w/o relief. No h/o neck or shoulder problems. No injuries. 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 No weakness in the arm or hand. No swelling in joints or warmth or redness. She is on FMLA from surgery until 10/28/2017.   I reviewed the patients updated PMH, FH, and SocHx.    Patient Active Problem List   Diagnosis Date Noted  . Vitamin D deficiency 07/07/2017  . Hearing loss of left ear due to cerumen impaction 11/19/2015  . Urinary incontinence in female 12/22/2013  . Asthma, mild intermittent 03/22/2013  . Bronchitis with obstruction ([place]) 06/28/2012  . Vaginal itching 06/01/2012  . Thyromegaly 03/18/2012  . General medical examination 03/14/2011  . Acute sinusitis 08/26/2010  . CHICKENPOX, HX OF 02/15/2010  . GASTROENTERITIS, ACUTE 08/15/2009  . Hyperlipidemia 02/14/2009  . DEPRESSION 02/14/2009  . ALLERGIC RHINITIS 02/14/2009   Current Meds  Medication Sig  . albuterol (PROVENTIL HFA;VENTOLIN HFA) 108 (90 BASE) MCG/ACT inhaler Inhale 2 puffs into the  lungs every 6 (six) hours as needed for wheezing.  Marland Kitchen ALPRAZolam (XANAX) 0.5 MG tablet Take 1 tablet (0.5 mg total) by mouth 3 (three) times daily as needed. (Patient taking differently: Take 0.5 mg by mouth 3 (three) times daily as needed for anxiety. )  . cetirizine (ZYRTEC) 10 MG tablet Take 10 mg by mouth daily.  . Cholecalciferol (VITAMIN D3) 2000 units TABS Take 2,000 Units by mouth daily.  Marland Kitchen estradiol (ESTRACE) 1 MG tablet Take 1 mg by mouth daily.   . famotidine (PEPCID) 20 MG tablet Take 20 mg by mouth daily.  . fluticasone (FLONASE) 50 MCG/ACT nasal spray PLACE 2 SPRAYS INTO BOTH NOSTRILS DAILY.  Marland Kitchen levonorgestrel (MIRENA) 20 MCG/24HR IUD 1 each by Intrauterine route once.    . montelukast (SINGULAIR) 10 MG tablet TAKE 1 TABLET (10 MG TOTAL) BY MOUTH DAILY.  . Multiple Vitamin (MULTIVITAMIN WITH MINERALS) TABS tablet Take 1 tablet by mouth daily.  . sertraline (ZOLOFT) 100 MG tablet TAKE 1 TABLET (100 MG TOTAL) BY MOUTH DAILY.  Marland Kitchen zolpidem (AMBIEN) 10 MG tablet Take 1 tablet (10 mg total) by mouth at bedtime as needed. for sleep (Patient taking differently: Take 10 mg by mouth at bedtime as needed for sleep. )   Past Medical History:  Diagnosis Date  . Allergic rhinitis   . Anxiety   . Asthma    related to seasonal allergies  . Breast calcification, right 02/02/2013   Surgically excised 02/17/13 > path benign   . Depression   . GERD (gastroesophageal  reflux disease)   . Hyperlipidemia   . IUD 02/07/10    Allergies: Patient is allergic to amoxicillin; erythromycin; hydrocodone-acetaminophen; and shrimp [shellfish allergy]. Family History: Patient family history includes Breast cancer in her unknown relative; Colon cancer in her paternal grandmother; Diabetes in her father; Fibrocystic breast disease in her mother; Hypertension in her father; Stroke in her maternal grandmother. Social History:  Patient  reports that she has never smoked. She has never used smokeless tobacco. She  reports that she does not drink alcohol or use drugs.  Review of Systems: Constitutional: Negative for fever malaise or anorexia Cardiovascular: negative for chest pain Respiratory: negative for SOB or persistent cough Gastrointestinal: negative for abdominal pain  Objective  Vitals: BP 114/80   Pulse 100   Temp 98.2 F (36.8 C) (Oral)   Resp 17   Ht 5\' 2"  (1.575 m)   Wt 199 lb 9.6 oz (90.5 kg)   SpO2 96%   BMI 36.51 kg/m  General: nontoxic but appears uncomfortable, A&Ox3 HEENT: PEERL, conjunctiva normal, Oropharynx moist,neck is supple but has + Spurlings, ttp right trap, no cervical spine ttp; pain with extension. FROM Left shoulder exam: nl ROM, active and passive. Mild anterior joint ttp, neg empty can testing. No impingement.  Nl left elbow and wrist; nl grip. Strength 5/5 right UE Cardiovascular:  RRR without murmur or gallop.  Respiratory:  Good breath sounds bilaterally, CTAB with normal respiratory effort Skin:  Warm, no rashes  Assessment  1. Cervical radiculopathy   2. Acute pain of right shoulder   3. S/P cholecystectomy      Plan   Possible radicular pain:  sxs most c/w pain coming from neck vs rotator cuff. Start steroids and gabapentin and recheck 1 week. Check xrays. Hopefully will get some pain relief and improvement. If not, then may need MRI/ortho or NS. Pt received toradol injection in office with mild benefit.   Follow up: Return in 1 week (on 10/21/2017).    Commons side effects, risks, benefits, and alternatives for medications and treatment plan prescribed today were discussed, and the patient expressed understanding of the given instructions. Patient is instructed to call or message via MyChart if he/she has any questions or concerns regarding our treatment plan. No barriers to understanding were identified. We discussed Red Flag symptoms and signs in detail. Patient expressed understanding regarding what to do in case of urgent or emergency type  symptoms.   Medication list was reconciled, printed and provided to the patient in AVS. Patient instructions and summary information was reviewed with the patient as documented in the AVS. This note was prepared with assistance of Dragon voice recognition software. Occasional wrong-word or sound-a-like substitutions may have occurred due to the inherent limitations of voice recognition software  Orders Placed This Encounter  Procedures  . DG Cervical Spine Complete  . DG Shoulder Right   Meds ordered this encounter  Medications  . gabapentin (NEURONTIN) 100 MG capsule    Sig: Take 1 capsule (100 mg total) by mouth 2 (two) times daily for 3 days, THEN 1 capsule (100 mg total) 3 (three) times daily for 3 days.    Dispense:  90 capsule    Refill:  0  . predniSONE (DELTASONE) 10 MG tablet    Sig: Take 4 tabs qd x 2 days, 3 qd x 2 days, 2 qd x 2d, 1qd x 3 days    Dispense:  21 tablet    Refill:  0  . ketorolac (TORADOL) injection  60 mg

## 2017-10-16 ENCOUNTER — Other Ambulatory Visit: Payer: Self-pay

## 2017-10-16 DIAGNOSIS — M5412 Radiculopathy, cervical region: Secondary | ICD-10-CM

## 2017-10-16 MED ORDER — GABAPENTIN 300 MG PO CAPS: 300.0000 mg | ORAL_CAPSULE | Freq: Three times a day (TID) | ORAL | 1 refills | Status: DC

## 2017-10-16 NOTE — Progress Notes (Signed)
Nl shoulder xray. See notes from cervical spine film.

## 2017-10-16 NOTE — Progress Notes (Signed)
Please call patient: I have reviewed his/her lab results. Her neck xrays show multilevel moderate to severe degenerative disc disease as expected. I do believe her pain is stemming from nerve impingement in her neck. F/u as directed. I hope the medications are helping.

## 2017-10-21 ENCOUNTER — Ambulatory Visit: Payer: 59 | Admitting: Family Medicine

## 2017-10-21 ENCOUNTER — Encounter: Payer: Self-pay | Admitting: *Deleted

## 2017-10-21 ENCOUNTER — Telehealth: Payer: Self-pay | Admitting: Family Medicine

## 2017-10-21 NOTE — Telephone Encounter (Signed)
Paperwork completed and faxed.  °

## 2017-10-21 NOTE — Telephone Encounter (Signed)
Received paperwork from [employer], via fax. Requesting clarification and additional information in referance to [facility] paperwork. Placed in front bin with charge sheet.

## 2017-10-21 NOTE — Telephone Encounter (Signed)
This encounter was created in error - please disregard.

## 2017-10-22 ENCOUNTER — Other Ambulatory Visit (HOSPITAL_COMMUNITY): Payer: Self-pay | Admitting: Neurosurgery

## 2017-10-22 DIAGNOSIS — M4722 Other spondylosis with radiculopathy, cervical region: Secondary | ICD-10-CM | POA: Diagnosis not present

## 2017-10-22 DIAGNOSIS — Z6836 Body mass index (BMI) 36.0-36.9, adult: Secondary | ICD-10-CM | POA: Diagnosis not present

## 2017-10-27 MED FILL — ALPRAZolam 0.5 MG TABS: 0.5 | 20 days supply | Qty: 60 | Fill #1

## 2017-10-27 MED FILL — GABAPENTIN 300 MG CAPSULE: 300 | 30 days supply | Qty: 90 | Fill #0

## 2017-10-27 MED FILL — ZOLPIDEM TARTRATE 10 MG TAB: 10 | 30 days supply | Qty: 30 | Fill #1

## 2017-10-30 ENCOUNTER — Ambulatory Visit (HOSPITAL_COMMUNITY)
Admission: RE | Admit: 2017-10-30 | Discharge: 2017-10-30 | Disposition: A | Discharge status: Home / Self Care | Payer: 59 | Source: Ambulatory Visit | Attending: Neurosurgery | Admitting: Neurosurgery

## 2017-10-30 DIAGNOSIS — M542 Cervicalgia: Secondary | ICD-10-CM | POA: Diagnosis not present

## 2017-10-30 DIAGNOSIS — M4722 Other spondylosis with radiculopathy, cervical region: Secondary | ICD-10-CM | POA: Diagnosis present

## 2017-10-30 DIAGNOSIS — M4802 Spinal stenosis, cervical region: Secondary | ICD-10-CM | POA: Diagnosis not present

## 2017-11-03 DIAGNOSIS — G5601 Carpal tunnel syndrome, right upper limb: Secondary | ICD-10-CM | POA: Diagnosis not present

## 2017-11-03 DIAGNOSIS — M4722 Other spondylosis with radiculopathy, cervical region: Secondary | ICD-10-CM | POA: Diagnosis not present

## 2017-11-03 DIAGNOSIS — Z6836 Body mass index (BMI) 36.0-36.9, adult: Secondary | ICD-10-CM | POA: Diagnosis not present

## 2017-11-05 ENCOUNTER — Other Ambulatory Visit: Payer: Self-pay | Admitting: Neurosurgery

## 2017-11-10 ENCOUNTER — Encounter (HOSPITAL_COMMUNITY): Payer: Self-pay

## 2017-11-10 ENCOUNTER — Other Ambulatory Visit: Payer: Self-pay

## 2017-11-10 ENCOUNTER — Encounter (HOSPITAL_COMMUNITY)
Admission: RE | Admit: 2017-11-10 | Discharge: 2017-11-10 | Disposition: A | Discharge status: Home / Self Care | Payer: 59 | Source: Ambulatory Visit | Attending: Neurosurgery | Admitting: Neurosurgery

## 2017-11-10 DIAGNOSIS — M4722 Other spondylosis with radiculopathy, cervical region: Secondary | ICD-10-CM | POA: Diagnosis not present

## 2017-11-10 DIAGNOSIS — Z79899 Other long term (current) drug therapy: Secondary | ICD-10-CM | POA: Diagnosis not present

## 2017-11-10 DIAGNOSIS — F418 Other specified anxiety disorders: Secondary | ICD-10-CM | POA: Diagnosis not present

## 2017-11-10 LAB — SURGICAL PCR SCREEN
MRSA, PCR: NEGATIVE
Staphylococcus aureus: NEGATIVE

## 2017-11-10 LAB — BASIC METABOLIC PANEL
Anion gap: 9 (ref 5–15)
BUN: 10 mg/dL (ref 6–20)
CO2: 24 mmol/L (ref 22–32)
Calcium: 9.2 mg/dL (ref 8.9–10.3)
Chloride: 106 mmol/L (ref 101–111)
Creatinine, Ser: 0.94 mg/dL (ref 0.44–1.00)
GFR calc Af Amer: >60 mL/min (ref >60)
GFR calc non Af Amer: >60 mL/min (ref >60)
Glucose, Bld: 80 mg/dL (ref 65–99)
Potassium: 3.7 mmol/L (ref 3.5–5.1)
Sodium: 139 mmol/L (ref 135–145)

## 2017-11-10 LAB — CBC
HCT: 36.9 % (ref 36.0–46.0)
Hemoglobin: 12.1 g/dL (ref 12.0–15.0)
MCH: 31 pg (ref 26.0–34.0)
MCHC: 32.8 g/dL (ref 30.0–36.0)
MCV: 94.6 fL (ref 78.0–100.0)
Platelets: 270 10*3/uL (ref 150–400)
RBC: 3.9 MIL/uL (ref 3.87–5.11)
RDW: 12.4 % (ref 11.5–15.5)
WBC: 6.8 10*3/uL (ref 4.0–10.5)

## 2017-11-10 NOTE — Progress Notes (Addendum)
PCP: Annye Asa, MD  Cardiologist:  EKG: 10/06/17 in [facility]  Stress test: pt denies  ECHO: pt denies  Cardiac Cath: pt denies  Chest x-ray: 10/06/17 in [facility]

## 2017-11-10 NOTE — Pre-Procedure Instructions (Signed)
Katelyn Reyes  11/10/2017      [place], [place] - [address] [facility]. [address] [place] [address] Phone: [phone] Fax: [place], [place] [address] [place] [address] Phone: [phone] Fax: [phone]    Your procedure is scheduled on Nov 12, 2017 .  Report to [facility] Admitting at 1145 AM.  Call this number if you have problems the morning of surgery:  [phone]   Remember:  No food or drink after midnight.  Continue all medications as directed by your physician except follow these medication instructions before surgery below    Take these medicines the morning of surgery with A SIP OF WATER  Albuterol inhaler Alprazolam (xanax)-if needed Cetirizine (zyrtec) Famotidine (pepcid) fluticasone (flonase) nasal spray-If needed Gabapentin (Neurontin) Montelukast (singulair) Sertraline (zoloft)  7 days prior to surgery STOP taking any Aspirin (unless otherwise instructed by your surgeon), Aleve, Naproxen, Ibuprofen, Motrin, Advil, Goody's, BC's, all herbal medications, fish oil, and all vitamins     Do not wear jewelry, make-up or nail polish.  Do not wear lotions, powders, or perfumes, or deodorant.  Do not shave 48 hours prior to surgery.   Do not bring valuables to the hospital.  [facility] is not responsible for any belongings or valuables.  Contacts, dentures or bridgework may not be worn into surgery.  Leave your suitcase in the car.  After surgery it may be brought to your room.  For patients admitted to the hospital, discharge time will be determined by your treatment team.  Patients discharged the day of surgery will not be allowed to drive home.   [facility]- Preparing For Surgery  Before surgery, you can play an important role. Because skin is not sterile, your skin needs to be as  free of germs as possible. You can reduce the number of germs on your skin by washing with CHG (chlorahexidine gluconate) Soap before surgery.  CHG is an antiseptic cleaner which kills germs and bonds with the skin to continue killing germs even after washing.    Oral Hygiene is also important to reduce your risk of infection.  Remember - BRUSH YOUR TEETH THE MORNING OF SURGERY WITH YOUR REGULAR TOOTHPASTE  Please do not use if you have an allergy to CHG or antibacterial soaps. If your skin becomes reddened/irritated stop using the CHG.  Do not shave (including legs and underarms) for at least 48 hours prior to first CHG shower. It is OK to shave your face.  Please follow these instructions carefully.   1. Shower the NIGHT BEFORE SURGERY and the MORNING OF SURGERY with CHG.   2. If you chose to wash your hair, wash your hair first as usual with your normal shampoo.  3. After you shampoo, rinse your hair and body thoroughly to remove the shampoo.  4. Use CHG as you would any other liquid soap. You can apply CHG directly to the skin and wash gently with a scrungie or a clean washcloth.   5. Apply the CHG Soap to your body ONLY FROM THE NECK DOWN.  Do not use on open wounds or open sores. Avoid contact with your eyes, ears, mouth and genitals (private parts). Wash Face and genitals (private parts)  with your normal soap.  6. Wash thoroughly, paying special attention to the area where your surgery will  be performed.  7. Thoroughly rinse your body with warm water from the neck down.  8. DO NOT shower/wash with your normal soap after using and rinsing off the CHG Soap.  9. Pat yourself dry with a CLEAN TOWEL.  10. Wear CLEAN PAJAMAS to bed the night before surgery, wear comfortable clothes the morning of surgery  11. Place CLEAN SHEETS on your bed the night of your first shower and DO NOT SLEEP WITH PETS.  Day of Surgery:  Do not apply any deodorants/lotions.  Please wear clean clothes to  the hospital/surgery center.   Remember to brush your teeth WITH YOUR REGULAR TOOTHPASTE.   Please read over the following fact sheets that you were given. Pain Booklet, Coughing and Deep Breathing and Surgical Site Infection Prevention

## 2017-11-11 ENCOUNTER — Other Ambulatory Visit: Payer: Self-pay | Admitting: Neurosurgery

## 2017-11-12 ENCOUNTER — Ambulatory Visit (HOSPITAL_COMMUNITY): Payer: 59 | Admitting: Certified Registered Nurse Anesthetist

## 2017-11-12 ENCOUNTER — Ambulatory Visit (HOSPITAL_COMMUNITY)
Admission: RE | Admit: 2017-11-12 | Discharge: 2017-11-13 | Disposition: A | Discharge status: Home / Self Care | Payer: 59 | Source: Ambulatory Visit | Attending: Neurosurgery | Admitting: Neurosurgery

## 2017-11-12 ENCOUNTER — Encounter (HOSPITAL_COMMUNITY)
Admission: RE | Disposition: A | Discharge status: Home / Self Care | Payer: Self-pay | Source: Ambulatory Visit | Attending: Neurosurgery

## 2017-11-12 ENCOUNTER — Inpatient Hospital Stay (HOSPITAL_COMMUNITY): Payer: 59

## 2017-11-12 ENCOUNTER — Encounter (HOSPITAL_COMMUNITY): Payer: Self-pay | Admitting: Certified Registered Nurse Anesthetist

## 2017-11-12 DIAGNOSIS — M9981 Other biomechanical lesions of cervical region: Secondary | ICD-10-CM | POA: Diagnosis not present

## 2017-11-12 DIAGNOSIS — Z981 Arthrodesis status: Secondary | ICD-10-CM | POA: Diagnosis not present

## 2017-11-12 DIAGNOSIS — Z419 Encounter for procedure for purposes other than remedying health state, unspecified: Secondary | ICD-10-CM

## 2017-11-12 DIAGNOSIS — M4722 Other spondylosis with radiculopathy, cervical region: Principal | ICD-10-CM | POA: Insufficient documentation

## 2017-11-12 DIAGNOSIS — I10 Essential (primary) hypertension: Secondary | ICD-10-CM | POA: Diagnosis not present

## 2017-11-12 DIAGNOSIS — E785 Hyperlipidemia, unspecified: Secondary | ICD-10-CM | POA: Diagnosis not present

## 2017-11-12 DIAGNOSIS — Z79899 Other long term (current) drug therapy: Secondary | ICD-10-CM | POA: Insufficient documentation

## 2017-11-12 DIAGNOSIS — Z6836 Body mass index (BMI) 36.0-36.9, adult: Secondary | ICD-10-CM | POA: Diagnosis not present

## 2017-11-12 DIAGNOSIS — Z9889 Other specified postprocedural states: Secondary | ICD-10-CM

## 2017-11-12 DIAGNOSIS — F418 Other specified anxiety disorders: Secondary | ICD-10-CM | POA: Insufficient documentation

## 2017-11-12 DIAGNOSIS — E559 Vitamin D deficiency, unspecified: Secondary | ICD-10-CM | POA: Diagnosis not present

## 2017-11-12 DIAGNOSIS — J4520 Mild intermittent asthma, uncomplicated: Secondary | ICD-10-CM | POA: Diagnosis not present

## 2017-11-12 DIAGNOSIS — M5412 Radiculopathy, cervical region: Secondary | ICD-10-CM | POA: Diagnosis not present

## 2017-11-12 HISTORY — PX: CERVICAL DISC ARTHROPLASTY: SHX587

## 2017-11-12 LAB — POCT PREGNANCY, URINE: Preg Test, Ur: NEGATIVE

## 2017-11-12 SURGERY — CERVICAL ANTERIOR DISC ARTHROPLASTY
Anesthesia: General | Site: Spine Cervical

## 2017-11-12 MED ORDER — ROCURONIUM BROMIDE 100 MG/10ML IV SOLN
INTRAVENOUS | Status: DC | PRN
  Administered 2017-11-12: 60 mg via INTRAVENOUS

## 2017-11-12 MED ORDER — PROPOFOL 10 MG/ML IV BOLUS
INTRAVENOUS | Status: DC | PRN
  Administered 2017-11-12: 130 mg via INTRAVENOUS
  Administered 2017-11-12: 10 mg via INTRAVENOUS

## 2017-11-12 MED ORDER — THROMBIN 5000 UNITS EX SOLR
CUTANEOUS | Status: AC
  Filled 2017-11-12: qty 10000

## 2017-11-12 MED ORDER — PHENOL 1.4 % MT LIQD: 1.0000 | OROMUCOSAL | Status: DC | PRN

## 2017-11-12 MED ORDER — OXYCODONE HCL ER 10 MG PO T12A
10.0000 mg | EXTENDED_RELEASE_TABLET | Freq: Two times a day (BID) | ORAL | Status: DC
  Administered 2017-11-12: 10 mg via ORAL
  Filled 2017-11-12 (×2): qty 1

## 2017-11-12 MED ORDER — SUGAMMADEX SODIUM 200 MG/2ML IV SOLN
INTRAVENOUS | Status: DC | PRN
  Administered 2017-11-12: 300 mg via INTRAVENOUS

## 2017-11-12 MED ORDER — CHLORHEXIDINE GLUCONATE CLOTH 2 % EX PADS: 6.0000 | MEDICATED_PAD | Freq: Once | CUTANEOUS | Status: DC

## 2017-11-12 MED ORDER — SODIUM CHLORIDE 0.9% FLUSH: 3.0000 mL | INTRAVENOUS | Status: DC | PRN

## 2017-11-12 MED ORDER — CELECOXIB 200 MG PO CAPS
200.0000 mg | ORAL_CAPSULE | Freq: Two times a day (BID) | ORAL | Status: DC
  Administered 2017-11-12: 200 mg via ORAL
  Filled 2017-11-12 (×2): qty 1

## 2017-11-12 MED ORDER — LIDOCAINE-EPINEPHRINE 0.5 %-1:200000 IJ SOLN
INTRAMUSCULAR | Status: AC
  Filled 2017-11-12: qty 1

## 2017-11-12 MED ORDER — VITAMIN D 1000 UNITS PO TABS
2000.0000 [IU] | ORAL_TABLET | Freq: Every day | ORAL | Status: DC
  Filled 2017-11-12: qty 2

## 2017-11-12 MED ORDER — HEMOSTATIC AGENTS (NO CHARGE) OPTIME
TOPICAL | Status: DC | PRN
  Administered 2017-11-12: 1 via TOPICAL

## 2017-11-12 MED ORDER — DEXAMETHASONE SODIUM PHOSPHATE 10 MG/ML IJ SOLN
INTRAMUSCULAR | Status: DC | PRN
  Administered 2017-11-12: 10 mg via INTRAVENOUS

## 2017-11-12 MED ORDER — VITAMIN B-12 1000 MCG PO TABS
1000.0000 ug | ORAL_TABLET | Freq: Every day | ORAL | Status: DC
  Filled 2017-11-12: qty 1

## 2017-11-12 MED ORDER — LEVONORGESTREL 20 MCG/24HR IU IUD: 1.0000 | INTRAUTERINE_SYSTEM | Freq: Once | INTRAUTERINE | Status: DC

## 2017-11-12 MED ORDER — DIAZEPAM 5 MG PO TABS
5.0000 mg | ORAL_TABLET | Freq: Four times a day (QID) | ORAL | Status: DC | PRN
  Administered 2017-11-13: 5 mg via ORAL
  Filled 2017-11-12: qty 1

## 2017-11-12 MED ORDER — ALPRAZOLAM 0.5 MG PO TABS: 0.5000 mg | ORAL_TABLET | Freq: Three times a day (TID) | ORAL | Status: DC | PRN

## 2017-11-12 MED ORDER — ROCURONIUM BROMIDE 50 MG/5ML IV SOLN
INTRAVENOUS | Status: AC
  Filled 2017-11-12: qty 1

## 2017-11-12 MED ORDER — ONDANSETRON HCL 4 MG/2ML IJ SOLN
INTRAMUSCULAR | Status: AC
  Filled 2017-11-12: qty 2

## 2017-11-12 MED ORDER — MORPHINE SULFATE (PF) 2 MG/ML IV SOLN: 2.0000 mg | INTRAVENOUS | Status: DC | PRN

## 2017-11-12 MED ORDER — LIDOCAINE-EPINEPHRINE 0.5 %-1:200000 IJ SOLN
INTRAMUSCULAR | Status: DC | PRN
  Administered 2017-11-12: 2 mL

## 2017-11-12 MED ORDER — ONDANSETRON HCL 4 MG/2ML IJ SOLN
INTRAMUSCULAR | Status: DC | PRN
  Administered 2017-11-12: 4 mg via INTRAVENOUS

## 2017-11-12 MED ORDER — FAMOTIDINE 20 MG PO TABS
20.0000 mg | ORAL_TABLET | Freq: Two times a day (BID) | ORAL | Status: DC
  Administered 2017-11-12 – 2017-11-13 (×2): 20 mg via ORAL
  Filled 2017-11-12 (×2): qty 1

## 2017-11-12 MED ORDER — ESTRADIOL 1 MG PO TABS
1.0000 mg | ORAL_TABLET | Freq: Every day | ORAL | Status: DC
  Filled 2017-11-12: qty 1

## 2017-11-12 MED ORDER — ACETAMINOPHEN 500 MG PO TABS
1000.0000 mg | ORAL_TABLET | Freq: Four times a day (QID) | ORAL | Status: DC
  Administered 2017-11-12 – 2017-11-13 (×2): 1000 mg via ORAL
  Filled 2017-11-12 (×2): qty 2

## 2017-11-12 MED ORDER — GABAPENTIN 300 MG PO CAPS
300.0000 mg | ORAL_CAPSULE | Freq: Three times a day (TID) | ORAL | Status: DC
  Administered 2017-11-12 – 2017-11-13 (×2): 300 mg via ORAL
  Filled 2017-11-12 (×2): qty 1

## 2017-11-12 MED ORDER — PROPOFOL 10 MG/ML IV BOLUS
INTRAVENOUS | Status: AC
  Filled 2017-11-12: qty 20

## 2017-11-12 MED ORDER — ONDANSETRON HCL 4 MG PO TABS: 4.0000 mg | ORAL_TABLET | Freq: Four times a day (QID) | ORAL | Status: DC | PRN

## 2017-11-12 MED ORDER — FENTANYL CITRATE (PF) 100 MCG/2ML IJ SOLN
INTRAMUSCULAR | Status: AC
  Administered 2017-11-12: 50 ug via INTRAVENOUS
  Filled 2017-11-12: qty 2

## 2017-11-12 MED ORDER — MONTELUKAST SODIUM 10 MG PO TABS
10.0000 mg | ORAL_TABLET | Freq: Every day | ORAL | Status: DC
  Filled 2017-11-12: qty 1

## 2017-11-12 MED ORDER — OXYCODONE HCL 5 MG PO TABS: 5.0000 mg | ORAL_TABLET | ORAL | Status: DC | PRN

## 2017-11-12 MED ORDER — MIDAZOLAM HCL 2 MG/2ML IJ SOLN
INTRAMUSCULAR | Status: AC
  Filled 2017-11-12: qty 2

## 2017-11-12 MED ORDER — VANCOMYCIN HCL IN DEXTROSE 1-5 GM/200ML-% IV SOLN
INTRAVENOUS | Status: AC
  Filled 2017-11-12: qty 200

## 2017-11-12 MED ORDER — ACETAMINOPHEN 650 MG RE SUPP: 650.0000 mg | RECTAL | Status: DC | PRN

## 2017-11-12 MED ORDER — ONDANSETRON HCL 4 MG/2ML IJ SOLN
4.0000 mg | Freq: Four times a day (QID) | INTRAMUSCULAR | Status: DC | PRN
Start: 2017-11-12 — End: 2017-11-13

## 2017-11-12 MED ORDER — LORATADINE 10 MG PO TABS
10.0000 mg | ORAL_TABLET | Freq: Every day | ORAL | Status: DC
  Filled 2017-11-12: qty 1

## 2017-11-12 MED ORDER — PHENYLEPHRINE 40 MCG/ML (10ML) SYRINGE FOR IV PUSH (FOR BLOOD PRESSURE SUPPORT)
PREFILLED_SYRINGE | INTRAVENOUS | Status: AC
  Filled 2017-11-12: qty 10

## 2017-11-12 MED ORDER — PHENYLEPHRINE HCL 10 MG/ML IJ SOLN
INTRAMUSCULAR | Status: DC | PRN
  Administered 2017-11-12: 100 ug via INTRAVENOUS

## 2017-11-12 MED ORDER — FENTANYL CITRATE (PF) 100 MCG/2ML IJ SOLN
25.0000 ug | INTRAMUSCULAR | Status: DC | PRN
  Administered 2017-11-12 (×2): 50 ug via INTRAVENOUS

## 2017-11-12 MED ORDER — OXYCODONE HCL 5 MG PO TABS
10.0000 mg | ORAL_TABLET | ORAL | Status: DC | PRN
  Administered 2017-11-13 (×3): 10 mg via ORAL
  Filled 2017-11-12 (×3): qty 2

## 2017-11-12 MED ORDER — SERTRALINE HCL 100 MG PO TABS
100.0000 mg | ORAL_TABLET | Freq: Every day | ORAL | Status: DC
  Filled 2017-11-12: qty 1

## 2017-11-12 MED ORDER — POTASSIUM CHLORIDE IN NACL 20-0.9 MEQ/L-% IV SOLN
INTRAVENOUS | Status: DC
Start: 2017-11-12 — End: 2017-11-13

## 2017-11-12 MED ORDER — ALBUTEROL SULFATE (2.5 MG/3ML) 0.083% IN NEBU
3.0000 mL | INHALATION_SOLUTION | Freq: Four times a day (QID) | RESPIRATORY_TRACT | Status: DC | PRN
  Filled 2017-11-12: qty 3

## 2017-11-12 MED ORDER — THROMBIN 5000 UNITS EX SOLR
CUTANEOUS | Status: DC | PRN
  Administered 2017-11-12 (×2): 5000 [IU] via TOPICAL

## 2017-11-12 MED ORDER — FENTANYL CITRATE (PF) 250 MCG/5ML IJ SOLN
INTRAMUSCULAR | Status: AC
  Filled 2017-11-12: qty 5

## 2017-11-12 MED ORDER — 0.9 % SODIUM CHLORIDE (POUR BTL) OPTIME
TOPICAL | Status: DC | PRN
  Administered 2017-11-12: 1000 mL

## 2017-11-12 MED ORDER — LIDOCAINE HCL (CARDIAC) PF 100 MG/5ML IV SOSY
PREFILLED_SYRINGE | INTRAVENOUS | Status: DC | PRN
  Administered 2017-11-12: 10 mg via INTRAVENOUS
  Administered 2017-11-12: 60 mg via INTRAVENOUS

## 2017-11-12 MED ORDER — FENTANYL CITRATE (PF) 100 MCG/2ML IJ SOLN
INTRAMUSCULAR | Status: DC | PRN
  Administered 2017-11-12 (×2): 100 ug via INTRAVENOUS
  Administered 2017-11-12: 50 ug via INTRAVENOUS

## 2017-11-12 MED ORDER — ACETAMINOPHEN 325 MG PO TABS: 650.0000 mg | ORAL_TABLET | ORAL | Status: DC | PRN

## 2017-11-12 MED ORDER — MIDAZOLAM HCL 5 MG/5ML IJ SOLN
INTRAMUSCULAR | Status: DC | PRN
  Administered 2017-11-12: 2 mg via INTRAVENOUS

## 2017-11-12 MED ORDER — ZOLPIDEM TARTRATE 5 MG PO TABS
5.0000 mg | ORAL_TABLET | Freq: Every evening | ORAL | Status: DC | PRN
Start: 2017-11-12 — End: 2017-11-13

## 2017-11-12 MED ORDER — SODIUM CHLORIDE 0.9 % IV SOLN: 250.0000 mL | INTRAVENOUS | Status: DC

## 2017-11-12 MED ORDER — EPHEDRINE SULFATE 50 MG/ML IJ SOLN
INTRAMUSCULAR | Status: AC
  Filled 2017-11-12: qty 1

## 2017-11-12 MED ORDER — VANCOMYCIN HCL IN DEXTROSE 1-5 GM/200ML-% IV SOLN: 1000.0000 mg | INTRAVENOUS | Status: DC

## 2017-11-12 MED ORDER — PHENYLEPHRINE HCL 10 MG/ML IJ SOLN
INTRAVENOUS | Status: DC | PRN
  Administered 2017-11-12: 25 ug/min via INTRAVENOUS

## 2017-11-12 MED ORDER — LACTATED RINGERS IV SOLN
INTRAVENOUS | Status: DC | PRN
  Administered 2017-11-12 (×2): via INTRAVENOUS

## 2017-11-12 MED ORDER — SODIUM CHLORIDE 0.9% FLUSH
3.0000 mL | Freq: Two times a day (BID) | INTRAVENOUS | Status: DC
Start: 2017-11-12 — End: 2017-11-13
  Administered 2017-11-13: 3 mL via INTRAVENOUS

## 2017-11-12 MED ORDER — MENTHOL 3 MG MT LOZG: 1.0000 | LOZENGE | OROMUCOSAL | Status: DC | PRN

## 2017-11-12 MED ORDER — FLUTICASONE PROPIONATE 50 MCG/ACT NA SUSP
2.0000 | Freq: Every day | NASAL | Status: DC
  Filled 2017-11-12: qty 16

## 2017-11-12 SURGICAL SUPPLY — 50 items
BIT DRILL FLUTELESS (BIT) ×2 IMPLANT
BNDG GAUZE ELAST 4 BULKY (GAUZE/BANDAGES/DRESSINGS) ×4 IMPLANT
BUR DRUM 4.0 (BURR) ×2 IMPLANT
BUR MATCHSTICK NEURO 3.0 LAGG (BURR) ×2 IMPLANT
CANISTER SUCT 3000ML PPV (MISCELLANEOUS) ×2 IMPLANT
CARTRIDGE OIL MAESTRO DRILL (MISCELLANEOUS) ×1 IMPLANT
DECANTER SPIKE VIAL GLASS SM (MISCELLANEOUS) IMPLANT
DERMABOND ADVANCED (GAUZE/BANDAGES/DRESSINGS) ×1
DERMABOND ADVANCED .7 DNX12 (GAUZE/BANDAGES/DRESSINGS) ×1 IMPLANT
DIFFUSER DRILL AIR PNEUMATIC (MISCELLANEOUS) ×2 IMPLANT
DISC CERVICAL PRESTIGE 7X16MM (Orthopedic Implant) ×2 IMPLANT
DRAPE C-ARM 42X72 X-RAY (DRAPES) ×2 IMPLANT
DRAPE C-ARMOR (DRAPES) ×2 IMPLANT
DRAPE HALF SHEET 40X57 (DRAPES) ×2 IMPLANT
DRAPE LAPAROTOMY 100X72 PEDS (DRAPES) ×2 IMPLANT
DRAPE MICROSCOPE LEICA (MISCELLANEOUS) ×2 IMPLANT
DURAPREP 6ML APPLICATOR 50/CS (WOUND CARE) ×2 IMPLANT
ELECT COATED BLADE 2.86 ST (ELECTRODE) ×2 IMPLANT
ELECT REM PT RETURN 9FT ADLT (ELECTROSURGICAL) ×2
ELECTRODE REM PT RTRN 9FT ADLT (ELECTROSURGICAL) ×1 IMPLANT
GLOVE BIOGEL PI IND STRL 6.5 (GLOVE) ×2 IMPLANT
GLOVE BIOGEL PI IND STRL 7.0 (GLOVE) ×2 IMPLANT
GLOVE BIOGEL PI IND STRL 7.5 (GLOVE) ×2 IMPLANT
GLOVE BIOGEL PI INDICATOR 6.5 (GLOVE) ×2
GLOVE BIOGEL PI INDICATOR 7.0 (GLOVE) ×2
GLOVE BIOGEL PI INDICATOR 7.5 (GLOVE) ×2
GLOVE ECLIPSE 6.5 STRL STRAW (GLOVE) ×4 IMPLANT
GLOVE SURG SS PI 6.0 STRL IVOR (GLOVE) ×4 IMPLANT
GLOVE SURG SS PI 6.5 STRL IVOR (GLOVE) ×4 IMPLANT
GOWN STRL REUS W/ TWL LRG LVL3 (GOWN DISPOSABLE) ×3 IMPLANT
GOWN STRL REUS W/ TWL XL LVL3 (GOWN DISPOSABLE) ×1 IMPLANT
GOWN STRL REUS W/TWL LRG LVL3 (GOWN DISPOSABLE) ×3
GOWN STRL REUS W/TWL XL LVL3 (GOWN DISPOSABLE) ×1
KIT BASIN OR (CUSTOM PROCEDURE TRAY) ×2 IMPLANT
KIT TURNOVER KIT B (KITS) ×2 IMPLANT
NEEDLE HYPO 25X1 1.5 SAFETY (NEEDLE) ×2 IMPLANT
NEEDLE SPNL 22GX3.5 QUINCKE BK (NEEDLE) ×4 IMPLANT
NS IRRIG 1000ML POUR BTL (IV SOLUTION) ×2 IMPLANT
OIL CARTRIDGE MAESTRO DRILL (MISCELLANEOUS) ×2
PACK LAMINECTOMY NEURO (CUSTOM PROCEDURE TRAY) ×2 IMPLANT
PAD ARMBOARD 7.5X6 YLW CONV (MISCELLANEOUS) ×6 IMPLANT
RUBBERBAND STERILE (MISCELLANEOUS) ×4 IMPLANT
SPONGE INTESTINAL PEANUT (DISPOSABLE) ×2 IMPLANT
SPONGE SURGIFOAM ABS GEL SZ50 (HEMOSTASIS) ×2 IMPLANT
SUT VIC AB 0 CT1 27 (SUTURE) ×1
SUT VIC AB 0 CT1 27XBRD ANTBC (SUTURE) ×1 IMPLANT
SUT VIC AB 3-0 SH 8-18 (SUTURE) ×4 IMPLANT
TOWEL GREEN STERILE (TOWEL DISPOSABLE) ×2 IMPLANT
TOWEL GREEN STERILE FF (TOWEL DISPOSABLE) ×2 IMPLANT
WATER STERILE IRR 1000ML POUR (IV SOLUTION) ×2 IMPLANT

## 2017-11-12 NOTE — Anesthesia Procedure Notes (Signed)
Procedure Name: Intubation Date/Time: 11/12/2017 7:40 PM Performed by: Eligha Bridegroom, CRNA Pre-anesthesia Checklist: Patient identified, Emergency Drugs available, Suction available, Patient being monitored and Timeout performed Patient Re-evaluated:Patient Re-evaluated prior to induction Preoxygenation: Pre-oxygenation with 100% oxygen Induction Type: IV induction Ventilation: Mask ventilation without difficulty and Oral airway inserted - appropriate to patient size Laryngoscope Size: Glidescope Grade View: Grade I Tube type: Oral Number of attempts: 1 Airway Equipment and Method: Stylet and Video-laryngoscopy Placement Confirmation: ETT inserted through vocal cords under direct vision,  positive ETCO2 and breath sounds checked- equal and bilateral Secured at: 21 cm Tube secured with: Tape Dental Injury: Teeth and Oropharynx as per pre-operative assessment

## 2017-11-12 NOTE — Transfer of Care (Signed)
Immediate Anesthesia Transfer of Care Note  Patient: Katelyn Reyes  Procedure(s) Performed: ARTIFICIAL DISC REPLACEMENT CERVICAL SIX-SEVEN (N/A Spine Cervical)  Patient Location: PACU  Anesthesia Type:General  Level of Consciousness: awake, alert  and oriented  Airway & Oxygen Therapy: Patient Spontanous Breathing  Post-op Assessment: Report given to RN and Post -op Vital signs reviewed and stable  Post vital signs: Reviewed and stable  Last Vitals:  Vitals Value Taken Time  BP 109/75 11/12/2017  9:57 PM  Temp    Pulse 88 11/12/2017 10:00 PM  Resp 16 11/12/2017 10:00 PM  SpO2 95 % 11/12/2017 10:00 PM  Vitals shown include unvalidated device data.  Last Pain:  Vitals:   11/12/17 1200  PainSc: 4       Patients Stated Pain Goal: 3 (20/94/70 9628)  Complications: No apparent anesthesia complications

## 2017-11-12 NOTE — Progress Notes (Signed)
Urine pregnancy test is negative.

## 2017-11-12 NOTE — Anesthesia Preprocedure Evaluation (Signed)
Anesthesia Evaluation  Patient identified by MRN, date of birth, ID band Patient awake    Reviewed: Allergy & Precautions, NPO status , Patient's Chart, lab work & pertinent test results  Airway Mallampati: II  TM Distance: >3 FB     Dental   Pulmonary asthma ,    breath sounds clear to auscultation       Cardiovascular negative cardio ROS   Rhythm:Regular Rate:Normal     Neuro/Psych    GI/Hepatic Neg liver ROS, GERD  ,  Endo/Other  negative endocrine ROS  Renal/GU negative Renal ROS     Musculoskeletal   Abdominal   Peds  Hematology   Anesthesia Other Findings   Reproductive/Obstetrics                             Anesthesia Physical Anesthesia Plan  ASA: III  Anesthesia Plan: General   Post-op Pain Management:    Induction: Intravenous  PONV Risk Score and Plan: Treatment may vary due to age or medical condition, Ondansetron, Dexamethasone and Midazolam  Airway Management Planned:   Additional Equipment:   Intra-op Plan:   Post-operative Plan: Extubation in OR  Informed Consent: I have reviewed the patients History and Physical, chart, labs and discussed the procedure including the risks, benefits and alternatives for the proposed anesthesia with the patient or authorized representative who has indicated his/her understanding and acceptance.   Dental advisory given  Plan Discussed with: CRNA and Anesthesiologist  Anesthesia Plan Comments:         Anesthesia Quick Evaluation

## 2017-11-12 NOTE — H&P (Signed)
BP (!) 119/43   Pulse 79   Temp 98.4 F (36.9 C)   Resp 20   SpO2 96%  Katelyn Reyes comes in today for evaluation of pain that she has in the right shoulder which is radiating into the right upper extremity. This has been ongoing now for approximately 2 months. There has been no antecedent trauma. The pain has gotten worse since it started and has responded to Neurontin, but did not respond to a steroid taper that she has completed.  She is 5 feet 3 inches, weighs 203 pounds, blood pressure is 103/71, pulse is 78, temperature is 97.8.  She has no discomfort on the left side. She did undergo a plain cervical spine series and was sent to me for further evaluation.  She is a Equities trader. She smokes a cigar every 3 months or so. She does drink alcohol socially. No history of substance abuse.  She has undergone a laparoscopic cholecystectomy, has had a Burch procedure, and has also had sinus surgery.  She has allergies to Amoxicillin, Vicodin, and shrimp. The Vicodin is intolerance due to hallucinations.  She is taking estradiol, Zyrtec, Singulair, Flonase, Zoloft, Gabapentin, Albuterol, Ambien, and Xanax.  Mother 66, is in good health, has hypertension. Father 41, is in good health, has hypertension and diabetes.  She says the 1st thing that started and that she was aware of was numbness. She has pain in the arms. She rates the pain as 5/10. Initially the pain was 10/10, but the Neurontin has helped with that. The numbness goes from the right shoulder to her hand. She has no bowel or bladder dysfunction. She has no weakness. She has no balance problems. She does find herself sometimes putting her hand behind her head to relieve it or simply hanging her arm and shoulder from her body. REVIEW OF SYSTEMS: Positive for anxiety depression, inhalant allergies. She has some seasonal allergies, which she is able to control with medication. PHYSICAL EXAMINATION: She is alert, oriented by 4, answering all  questions appropriately. Memory, language, attention span, and fund of knowledge are normal. Speech is clear. It is also fluent. Hearing is intact to finger rub bilaterally. She has symmetric facial sensation. Uvula elevates midline. Shoulder shrug is normal. Tongue protrudes in midline. She has 5/5 strength in both upper and lower extremities. Intact proprioception in the upper extremities. She has 2+ reflexes, biceps, triceps, brachioradialis, knees, and ankles. Gait is normal. Romberg is negative. She can tandem walk easily.  Plain x-rays of the cervical spine are reviewed. They show significant spondylitic change at every single level of the cervical spine. She has degenerative disc changes at every single level of the cervical spine. The neural foramina, however, are not nearly as bad as what 1 would have expected, looking at the lateral or AP. There is some foramina which is smaller, but they have smooth contours and there are no bone spur sticking in, per se into the foramen at many of these levels.  I do believe given the fact that she has had full treatment with conservative treatment, some physical therapy, medications and time, 2 months, that an MRI at this time is warranted. I do believe she does have radicular problems as the pain has moved from the shoulder into the right upper extremity. She has no pain with passive movement of her right shoulder or active movement of the right shoulder and no weakness in the deltoid. I will see Katelyn Reyes back in the office after she  has undergone her MRI examination. Katelyn Reyes returns today. She has horrific spondylitic disease in the cervical spine, 3-4, 4-5, 5-6, and 6-7. 7-1 appears to be normal on the plain films. 2-3 even has some anterior osteophytes. However, posteriorly, most of the problem is not there and foraminal narrowing she has present on the right side at 6-7, but nothing very much other than 3-4 on the left. Her pain is in the right upper  extremity. Goes down the arm. She has absolutely no neck pain. Therefore, I do not think all that other spondylitic disease is, quite honestly, important or relevant right now. I believe that artificial disc placement and cleaning out the neural foramen at 6-7 on the right side would provide significant relief.

## 2017-11-12 NOTE — Op Note (Signed)
11/12/2017  10:30 PM  PATIENT:  [name]  57 y.o. female  PRE-OPERATIVE DIAGNOSIS:  SPONDYLOSIS OF CERVICAL SPINE WITH RADICULOPATHY C6/7, right  POST-OPERATIVE DIAGNOSIS:  SPONDYLOSIS OF CERVICAL SPINE WITH RADICULOPATHY C6/7, right  PROCEDURE:  Anterior Cervical decompression C6/7 Arthroplasty C6/7(medtronic)   SURGEON:   Surgeon(s): Ashok Pall, MD   ASSISTANTS:none  ANESTHESIA:   general  EBL:  Total I/O In: 1600 [I.V.:1600] Out: 30 [Blood:30]  BLOOD ADMINISTERED:none  CELL SAVER GIVEN:none  COUNT:per nursing  DRAINS: none   SPECIMEN:  No Specimen  DICTATION: Ms. Shuford was taken to the operating room, intubated, and placed under general anesthesia without difficulty. She was positioned supine with her head in slight extension on the operating room table with a roll underneath her shoulders. The neck was prepped and draped in a sterile manner. I infiltrated 5 cc's 1/2%lidocaine/1:200,000 strength epinephrine into the planned incision starting from the midline to the medial border of the left sternocleidomastoid muscle. I opened the incision with a 10 blade and dissected sharply through soft tissue to the platysma. I dissected in the plane superior to the platysma both rostrally and caudally. I then opened the platysma in a horizontal fashion with Metzenbaum scissors, and dissected in the inferior plane rostrally and caudally. With both blunt and sharp technique I created an avascular corridor to the cervical spine. I placed a spinal needle(s) in the disc space at c6/7, and 5/6 . I then reflected the longus colli from C6 to C7 and placed self retaining retractors. I opened the disc space(s) at 6/7 with a 15 blade. I removed disc with curettes, Kerrison punches, and the drill. Using the drill I removed osteophytes and prepared for the decompression.  I decompressed the spinal canal and the C7 root(s) with the drill, Kerrison punches, and the curettes. I used the  microscope to aid in microdissection. I removed the posterior longitudinal ligament to fully expose and decompress the thecal sac. I exposed the roots laterally taking down the 6/7 uncovertebral joints. With the decompression complete I moved on to the arthroplasty. I used the drill to level the surfaces of C6 and 7. I removed soft tissue to prepare the disc space and the bony surfaces. I measured the space and placed a 7x22mm implant into the disc space. Fluoroscopy showed the disc to be in proper position and well seated, as did direct inspection. I irrigated the wound, achieved hemostasis, and closed the wound in layers. I approximated the platysma, and the subcuticular plane with vicryl sutures. I used Dermabond for a sterile dressing.   PLAN OF CARE: Admit for overnight observation  PATIENT DISPOSITION:  PACU - hemodynamically stable.   Delay start of Pharmacological VTE agent (>24hrs) due to surgical blood loss or risk of bleeding:  yes

## 2017-11-13 ENCOUNTER — Encounter (HOSPITAL_COMMUNITY): Payer: Self-pay | Admitting: Neurosurgery

## 2017-11-13 DIAGNOSIS — M4722 Other spondylosis with radiculopathy, cervical region: Secondary | ICD-10-CM | POA: Diagnosis not present

## 2017-11-13 DIAGNOSIS — F418 Other specified anxiety disorders: Secondary | ICD-10-CM | POA: Diagnosis not present

## 2017-11-13 DIAGNOSIS — Z79899 Other long term (current) drug therapy: Secondary | ICD-10-CM | POA: Diagnosis not present

## 2017-11-13 MED ORDER — KETOROLAC TROMETHAMINE 30 MG/ML IJ SOLN: 30.0000 mg | Freq: Once | INTRAMUSCULAR | Status: DC

## 2017-11-13 MED ORDER — ACETAMINOPHEN-CODEINE #3 300-30 MG PO TABS
1.0000 | ORAL_TABLET | Freq: Four times a day (QID) | ORAL | 0 refills | Status: DC | PRN
Start: 2017-11-13 — End: 2018-07-12

## 2017-11-13 MED ORDER — OXYCODONE HCL ER 10 MG PO T12A: 10.0000 mg | EXTENDED_RELEASE_TABLET | Freq: Two times a day (BID) | ORAL | 0 refills | Status: DC

## 2017-11-13 MED FILL — ACETAMINOPHEN/COD #3 TABLET: 300-30 | 7 days supply | Qty: 28 | Fill #0

## 2017-11-13 MED FILL — oxyCODONE HCL ER 10 MG T12A: 10 | 7 days supply | Qty: 14 | Fill #0

## 2017-11-13 NOTE — Discharge Summary (Signed)
Physician Discharge Summary  Patient ID: Katelyn Reyes MRN: [id number] DOB/AGE: 1961-01-18 57 y.o.  Admit date: 11/12/2017 Discharge date: 11/13/2017  Admission Diagnoses:Cervical spondylosis with radiculopathy, C6/7  Discharge Diagnoses: same Active Problems:   Status post surgery   Cervical spondylosis with radiculopathy   Discharged Condition: good  Hospital Course: [name] was admitted and taken to the operating room for an uncomplicated cervical arthroplasty using the Prestige disc(medtronic) 7x15mm implant. Post op she is alert, oriented x 4, speaking clearly, with a strong voice. The wound is clean, dry, and without signs of infection. She is voiding, ambulating, and tolerating a regular diet.  Treatments: surgery: as above  Discharge Exam: Blood pressure 99/60, pulse 68, temperature 97.9 F (36.6 C), temperature source Oral, resp. rate 20, SpO2 98 %. General appearance: alert, cooperative, appears stated age and mild distress Neurologic: Alert and oriented X 3, normal strength and tone. Normal symmetric reflexes. Normal coordination and gait  Disposition: Discharge disposition: 01-Home or Self Care      SPONDYLOSIS OF CERVICAL SPINE WITH RADICULOPATHY  Allergies as of 11/13/2017      Reactions   Amoxicillin Rash, Other (See Comments)   Has patient had a PCN reaction causing immediate rash, facial/tongue/throat swelling, SOB or lightheadedness with hypotension: No Has patient had a PCN reaction causing severe rash involving mucus membranes or skin necrosis: No Has patient had a PCN reaction that required treatment: #  #  #  YES  #  #  # - MD office Has patient had a PCN reaction occurring within the last 10 years: Yes If all of the above answers are "NO", then may proceed with Cephalosporin use.   Erythromycin Nausea Only   Hydrocodone-acetaminophen Other (See Comments)   Hallucinations   Shrimp [shellfish Allergy] Itching, Other (See Comments)   REACTS TO  SCALLOPS      Medication List    TAKE these medications   acetaminophen-codeine 300-30 MG tablet Commonly known as:  TYLENOL #3 Take 1 tablet by mouth every 6 (six) hours as needed for moderate pain.   albuterol 108 (90 Base) MCG/ACT inhaler Commonly known as:  PROVENTIL HFA;VENTOLIN HFA Inhale 2 puffs into the lungs every 6 (six) hours as needed for wheezing.   ALPRAZolam 0.5 MG tablet Commonly known as:  XANAX Take 1 tablet (0.5 mg total) by mouth 3 (three) times daily as needed. What changed:  reasons to take this   cetirizine 10 MG tablet Commonly known as:  ZYRTEC Take 10 mg by mouth daily.   estradiol 1 MG tablet Commonly known as:  ESTRACE Take 1 mg by mouth daily.   famotidine 20 MG tablet Commonly known as:  PEPCID Take 20 mg by mouth 2 (two) times daily.   fluticasone 50 MCG/ACT nasal spray Commonly known as:  FLONASE PLACE 2 SPRAYS INTO BOTH NOSTRILS DAILY.   gabapentin 300 MG capsule Commonly known as:  NEURONTIN Take 1 capsule (300 mg total) by mouth 3 (three) times daily.   levonorgestrel 20 MCG/24HR IUD Commonly known as:  MIRENA 1 each by Intrauterine route once.   metaxalone 400 MG tablet Commonly known as:  SKELAXIN Take 1 tablet (400 mg total) by mouth 3 (three) times daily.   montelukast 10 MG tablet Commonly known as:  SINGULAIR TAKE 1 TABLET (10 MG TOTAL) BY MOUTH DAILY.   multivitamin with minerals Tabs tablet Take 1 tablet by mouth daily.   oxyCODONE 10 mg 12 hr tablet Commonly known as:  OXYCONTIN Take 1 tablet (  10 mg total) by mouth every 12 (twelve) hours.   sertraline 100 MG tablet Commonly known as:  ZOLOFT TAKE 1 TABLET (100 MG TOTAL) BY MOUTH DAILY.   vitamin B-12 1000 MCG tablet Commonly known as:  CYANOCOBALAMIN Take 1,000 mcg by mouth daily.   Vitamin D3 2000 units Tabs Take 2,000 Units by mouth daily.   zolpidem 10 MG tablet Commonly known as:  AMBIEN Take 1 tablet (10 mg total) by mouth at bedtime as needed.  for sleep What changed:    reasons to take this  additional instructions      Follow-up Information    Ashok Pall, MD Follow up in 3 week(s).   Specialty:  Neurosurgery Why:  please call the office to make an appointment Contact information: [address] [place] [address] [phone]           Signed: Winfield Cunas 11/13/2017, 12:35 PM

## 2017-11-16 NOTE — Anesthesia Postprocedure Evaluation (Signed)
Anesthesia Post Note  Patient: Katelyn Reyes  Procedure(s) Performed: ARTIFICIAL DISC REPLACEMENT CERVICAL SIX-SEVEN (N/A Spine Cervical)     Patient location during evaluation: PACU Anesthesia Type: General Level of consciousness: awake and alert Pain management: pain level controlled Vital Signs Assessment: post-procedure vital signs reviewed and stable Respiratory status: spontaneous breathing, nonlabored ventilation, respiratory function stable and patient connected to nasal cannula oxygen Cardiovascular status: blood pressure returned to baseline and stable Postop Assessment: no apparent nausea or vomiting Anesthetic complications: no    Last Vitals:  Vitals:   11/13/17 0352 11/13/17 0900  BP: (!) 94/56 99/60  Pulse: 63 68  Resp:  20  Temp: 36.8 C 36.6 C  SpO2: 99% 98%    Last Pain:  Vitals:   11/13/17 1337  TempSrc:   PainSc: 3                  [name]

## 2017-11-20 MED FILL — oxyCODONE HCL 5 MG TABS: 5 | 14 days supply | Qty: 56 | Fill #0

## 2017-11-20 MED FILL — tiZANidine HCL 4 MG TABS: 4 | 20 days supply | Qty: 60 | Fill #0

## 2017-11-30 DIAGNOSIS — Z124 Encounter for screening for malignant neoplasm of cervix: Secondary | ICD-10-CM | POA: Diagnosis not present

## 2017-11-30 DIAGNOSIS — Z01419 Encounter for gynecological examination (general) (routine) without abnormal findings: Secondary | ICD-10-CM | POA: Diagnosis not present

## 2017-11-30 DIAGNOSIS — Z6835 Body mass index (BMI) 35.0-35.9, adult: Secondary | ICD-10-CM | POA: Diagnosis not present

## 2017-11-30 DIAGNOSIS — N959 Unspecified menopausal and perimenopausal disorder: Secondary | ICD-10-CM | POA: Diagnosis not present

## 2017-11-30 LAB — HM PAP SMEAR

## 2017-12-03 DIAGNOSIS — R030 Elevated blood-pressure reading, without diagnosis of hypertension: Secondary | ICD-10-CM | POA: Diagnosis not present

## 2017-12-03 DIAGNOSIS — Z6835 Body mass index (BMI) 35.0-35.9, adult: Secondary | ICD-10-CM | POA: Diagnosis not present

## 2017-12-03 DIAGNOSIS — M5412 Radiculopathy, cervical region: Secondary | ICD-10-CM | POA: Diagnosis not present

## 2017-12-04 ENCOUNTER — Encounter: Payer: Self-pay | Admitting: General Practice

## 2017-12-11 MED FILL — METAXALONE 800 MG TABLET: 800 | 13 days supply | Qty: 40 | Fill #0

## 2018-01-05 ENCOUNTER — Other Ambulatory Visit: Payer: Self-pay | Admitting: Family Medicine

## 2018-01-05 MED FILL — CETIRIZINE HCL 10 MG TABS: 10 | 90 days supply | Qty: 90 | Fill #1

## 2018-01-05 MED FILL — SERTRALINE HCL 100 MG TAB: 100 | 90 days supply | Qty: 90 | Fill #0 | Status: TO

## 2018-01-05 MED FILL — ALPRAZolam 0.5 MG TABS: 0.5 | 20 days supply | Qty: 60 | Fill #0

## 2018-01-05 MED FILL — ZOLPIDEM TARTRATE 10 MG TAB: 10 | 30 days supply | Qty: 30 | Fill #0

## 2018-01-05 NOTE — Telephone Encounter (Signed)
Last OV 10/14/17 Alprazolam last filled 07/07/17 #60 with 3 Zolpidem last filled 07/07/17 #30 with 3

## 2018-01-27 MED FILL — ESTRADIOL 1 MG TABLET: 1 | 90 days supply | Qty: 90 | Fill #0

## 2018-01-28 ENCOUNTER — Other Ambulatory Visit: Payer: Self-pay | Admitting: Family Medicine

## 2018-01-28 MED FILL — MONTELUKAST SOD 10 MG TAB: 10 | 90 days supply | Qty: 90 | Fill #0 | Status: TO

## 2018-02-08 MED FILL — tiZANidine HCL 4 MG TABS: 4 | 20 days supply | Qty: 60 | Fill #0

## 2018-02-26 ENCOUNTER — Other Ambulatory Visit: Payer: Self-pay | Admitting: Family Medicine

## 2018-02-26 MED FILL — VENTOLIN HFA 90 MCG INHALER: 108 (90 BAS | 25 days supply | Qty: 18 | Fill #0

## 2018-03-16 MED FILL — ZOLPIDEM TARTRATE 10 MG TAB: 10 | 30 days supply | Qty: 30 | Fill #1

## 2018-04-16 ENCOUNTER — Other Ambulatory Visit: Payer: Self-pay | Admitting: Family Medicine

## 2018-04-16 MED FILL — CETIRIZINE HCL 10 MG TABS: 10 | 90 days supply | Qty: 90 | Fill #0

## 2018-04-16 MED FILL — SERTRALINE HCL 100 MG TAB: 100 | 90 days supply | Qty: 90 | Fill #1 | Status: TO

## 2018-05-10 MED FILL — ESTRADIOL 1 MG TABLET: 1 | 90 days supply | Qty: 90 | Fill #1

## 2018-05-11 MED FILL — MONTELUKAST SOD 10 MG TAB: 10 | 90 days supply | Qty: 90 | Fill #1 | Status: TO

## 2018-06-10 MED FILL — ZOLPIDEM TARTRATE 10 MG TAB: 10 | 30 days supply | Qty: 30 | Fill #2

## 2018-06-10 MED FILL — ALPRAZolam 0.5 MG TABS: 0.5 | 20 days supply | Qty: 60 | Fill #1

## 2018-06-11 ENCOUNTER — Encounter: Payer: 59 | Admitting: Family Medicine

## 2018-07-08 ENCOUNTER — Encounter: Payer: 59 | Admitting: Family Medicine

## 2018-07-12 ENCOUNTER — Other Ambulatory Visit: Payer: Self-pay

## 2018-07-12 ENCOUNTER — Encounter: Payer: Self-pay | Admitting: Family Medicine

## 2018-07-12 ENCOUNTER — Ambulatory Visit: Payer: 59 | Admitting: Family Medicine

## 2018-07-12 VITALS — BP 110/78 | HR 80 | Temp 98.0°F | Resp 16 | Ht 63.0 in | Wt 204.0 lb

## 2018-07-12 DIAGNOSIS — E669 Obesity, unspecified: Secondary | ICD-10-CM

## 2018-07-12 DIAGNOSIS — E559 Vitamin D deficiency, unspecified: Secondary | ICD-10-CM

## 2018-07-12 DIAGNOSIS — Z0000 Encounter for general adult medical examination without abnormal findings: Secondary | ICD-10-CM

## 2018-07-12 MED ORDER — ZOLPIDEM TARTRATE 10 MG PO TABS: 10.0000 mg | ORAL_TABLET | Freq: Every evening | ORAL | 3 refills | Status: DC | PRN

## 2018-07-12 MED ORDER — SERTRALINE HCL 100 MG PO TABS: 100.0000 mg | ORAL_TABLET | Freq: Every day | ORAL | 1 refills | Status: DC

## 2018-07-12 MED ORDER — ALPRAZOLAM 0.5 MG PO TABS: 0.5000 mg | ORAL_TABLET | Freq: Three times a day (TID) | ORAL | 3 refills | Status: DC | PRN

## 2018-07-12 MED ORDER — ALBUTEROL SULFATE HFA 108 (90 BASE) MCG/ACT IN AERS: INHALATION_SPRAY | RESPIRATORY_TRACT | 6 refills | Status: DC

## 2018-07-12 MED ORDER — MONTELUKAST SODIUM 10 MG PO TABS: 10.0000 mg | ORAL_TABLET | Freq: Every day | ORAL | 1 refills | Status: DC

## 2018-07-12 MED ORDER — FLUTICASONE PROPIONATE 50 MCG/ACT NA SUSP: 2.0000 | Freq: Every day | NASAL | 6 refills | Status: DC

## 2018-07-12 MED ORDER — CETIRIZINE HCL 10 MG PO TABS: ORAL_TABLET | ORAL | 1 refills | Status: DC

## 2018-07-12 MED FILL — SERTRALINE HCL 100 MG TAB: 100 | 90 days supply | Qty: 90 | Fill #0

## 2018-07-12 MED FILL — VENTOLIN HFA 90 MCG INHALER: 108 (90 BAS | 25 days supply | Qty: 18 | Fill #0

## 2018-07-12 MED FILL — CETIRIZINE HCL 10 MG TABS: 10 | 90 days supply | Qty: 90 | Fill #0

## 2018-07-12 MED FILL — FLUTICASONE PROP 50 MCG SPR: 50 | 30 days supply | Qty: 16 | Fill #0

## 2018-07-12 MED FILL — ZOLPIDEM TARTRATE 10 MG TAB: 10 | 30 days supply | Qty: 30 | Fill #0

## 2018-07-12 MED FILL — ALPRAZolam 0.5 MG TABS: 0.5 | 20 days supply | Qty: 60 | Fill #0

## 2018-07-12 NOTE — Progress Notes (Signed)
   Subjective:    Patient ID: Katelyn Reyes, female    DOB: [birth year], 58 y.o.   MRN: [id number]  HPI CPE- UTD on colonoscopy, pap.  Due for mammo.  UTD on immunizations.   Review of Systems Patient reports no vision/ hearing changes, adenopathy,fever, weight change,  persistant/recurrent hoarseness , swallowing issues, chest pain, palpitations, edema, persistant/recurrent cough, hemoptysis, dyspnea (rest/exertional/paroxysmal nocturnal), gastrointestinal bleeding (melena, rectal bleeding), abdominal pain, significant heartburn, bowel changes, GU symptoms (dysuria, hematuria, incontinence), Gyn symptoms (abnormal  bleeding, pain),  syncope, focal weakness, memory loss, numbness & tingling, skin/hair/nail changes, abnormal bruising or bleeding, anxiety, or depression.     Objective:   Physical Exam General Appearance:    Alert, cooperative, no distress, appears stated age, obese  Head:    Normocephalic, without obvious abnormality, atraumatic  Eyes:    PERRL, conjunctiva/corneas clear, EOM's intact, fundi    benign, both eyes  Ears:    Normal TM's and external ear canals, both ears  Nose:   Nares normal, septum midline, mucosa normal, no drainage    or sinus tenderness  Throat:   Lips, mucosa, and tongue normal; teeth and gums normal  Neck:   Supple, symmetrical, trachea midline, no adenopathy;    Thyroid: no enlargement/tenderness/nodules  Back:     Symmetric, no curvature, ROM normal, no CVA tenderness  Lungs:     Clear to auscultation bilaterally, respirations unlabored  Chest Wall:    No tenderness or deformity   Heart:    Regular rate and rhythm, S1 and S2 normal, no murmur, rub   or gallop  Breast Exam:    Deferred to GYN  Abdomen:     Soft, non-tender, bowel sounds active all four quadrants,    no masses, no organomegaly  Genitalia:    Deferred to GYN  Rectal:    Extremities:   Extremities normal, atraumatic, no cyanosis or edema  Pulses:   2+ and symmetric all extremities   Skin:   Skin color, texture, turgor normal, no rashes or lesions  Lymph nodes:   Cervical, supraclavicular, and axillary nodes normal  Neurologic:   CNII-XII intact, normal strength, sensation and reflexes    throughout          Assessment & Plan:

## 2018-07-12 NOTE — Patient Instructions (Signed)
Follow up in 1 year or as needed We'll notify you of your lab results and make any changes if needed Continue to work on healthy diet and regular exercise- you can do it! Call with any questions or concerns Happy New Year!

## 2018-07-12 NOTE — Assessment & Plan Note (Signed)
Ongoing issue for pt.  Stressed need for healthy diet and regular exercise.  Check labs to risk stratify.  Will follow 

## 2018-07-12 NOTE — Assessment & Plan Note (Signed)
Pt has hx of this.  Check labs and replete prn. 

## 2018-07-12 NOTE — Assessment & Plan Note (Signed)
Pt's PE WNL w/ exception of obesity.  UTD on pap, due for mammo- pt plans to schedule.  Check labs.  Anticipatory guidance provided.

## 2018-07-13 LAB — BASIC METABOLIC PANEL
BUN: 15 mg/dL (ref 6–23)
CO2: 29 mEq/L (ref 19–32)
Calcium: 9.5 mg/dL (ref 8.4–10.5)
Chloride: 106 mEq/L (ref 96–112)
Creatinine, Ser: 0.99 mg/dL (ref 0.40–1.20)
GFR: 70.44 mL/min (ref >60.00)
Glucose, Bld: 109 mg/dL — ABNORMAL HIGH (ref 70–99)
Potassium: 4.3 mEq/L (ref 3.5–5.1)
Sodium: 141 mEq/L (ref 135–145)

## 2018-07-13 LAB — CBC WITH DIFFERENTIAL/PLATELET
Basophils Absolute: 0.1 10*3/uL (ref 0.0–0.1)
Basophils Relative: 0.8 % (ref 0.0–3.0)
Eosinophils Absolute: 0.2 10*3/uL (ref 0.0–0.7)
Eosinophils Relative: 2.7 % (ref 0.0–5.0)
HCT: 36.8 % (ref 36.0–46.0)
Hemoglobin: 12.3 g/dL (ref 12.0–15.0)
Lymphocytes Relative: 46.2 % — ABNORMAL HIGH (ref 12.0–46.0)
Lymphs Abs: 3.1 10*3/uL (ref 0.7–4.0)
MCHC: 33.3 g/dL (ref 30.0–36.0)
MCV: 94.3 fl (ref 78.0–100.0)
Monocytes Absolute: 0.6 10*3/uL (ref 0.1–1.0)
Monocytes Relative: 8.3 % (ref 3.0–12.0)
Neutro Abs: 2.8 10*3/uL (ref 1.4–7.7)
Neutrophils Relative %: 42 % — ABNORMAL LOW (ref 43.0–77.0)
Platelets: 297 10*3/uL (ref 150.0–400.0)
RBC: 3.91 Mil/uL (ref 3.87–5.11)
RDW: 13.2 % (ref 11.5–15.5)
WBC: 6.7 10*3/uL (ref 4.0–10.5)

## 2018-07-13 LAB — LIPID PANEL
Cholesterol: 173 mg/dL (ref 0–200)
HDL: 50.2 mg/dL (ref >39.00)
LDL Cholesterol: 91 mg/dL (ref 0–99)
NonHDL: 123.25
Total CHOL/HDL Ratio: 3
Triglycerides: 159 mg/dL — ABNORMAL HIGH (ref 0.0–149.0)
VLDL: 31.8 mg/dL (ref 0.0–40.0)

## 2018-07-13 LAB — VITAMIN D 25 HYDROXY (VIT D DEFICIENCY, FRACTURES): VITD: 34.33 ng/mL (ref 30.00–100.00)

## 2018-07-13 LAB — HEPATIC FUNCTION PANEL
ALT: 22 U/L (ref 0–35)
AST: 18 U/L (ref 0–37)
Albumin: 4.1 g/dL (ref 3.5–5.2)
Alkaline Phosphatase: 72 U/L (ref 39–117)
Bilirubin, Direct: 0.1 mg/dL (ref 0.0–0.3)
Total Bilirubin: 0.3 mg/dL (ref 0.2–1.2)
Total Protein: 7 g/dL (ref 6.0–8.3)

## 2018-07-13 LAB — TSH: TSH: 2.3 u[IU]/mL (ref 0.35–4.50)

## 2018-08-06 MED FILL — CETIRIZINE HCL 10 MG TABS: 10 | 90 days supply | Qty: 90 | Fill #0

## 2018-08-06 MED FILL — MONTELUKAST SOD 10 MG TAB: 10 | 90 days supply | Qty: 90 | Fill #0

## 2018-08-06 MED FILL — SERTRALINE HCL 100 MG TAB: 100 | 90 days supply | Qty: 90 | Fill #0

## 2018-08-06 MED FILL — ZOLPIDEM TARTRATE 10 MG TAB: 10 | 30 days supply | Qty: 30 | Fill #0

## 2018-08-06 MED FILL — ESTRADIOL 1 MG TABLET: 1 | 90 days supply | Qty: 90 | Fill #2

## 2018-08-24 ENCOUNTER — Ambulatory Visit: Payer: 59 | Admitting: Family Medicine

## 2018-08-24 ENCOUNTER — Other Ambulatory Visit: Payer: Self-pay

## 2018-08-24 ENCOUNTER — Encounter: Payer: Self-pay | Admitting: Family Medicine

## 2018-08-24 VITALS — BP 118/72 | Temp 98.1°F | Resp 16 | Ht 63.0 in | Wt 202.4 lb

## 2018-08-24 DIAGNOSIS — M25461 Effusion, right knee: Secondary | ICD-10-CM | POA: Diagnosis not present

## 2018-08-24 DIAGNOSIS — M25561 Pain in right knee: Secondary | ICD-10-CM

## 2018-08-24 MED ORDER — TRIAMCINOLONE ACETONIDE 40 MG/ML IJ SUSP
50.0000 mg | Freq: Once | INTRAMUSCULAR | Status: AC
  Administered 2018-08-24: 50 mg via INTRAMUSCULAR

## 2018-08-24 MED ORDER — TRAMADOL HCL 50 MG PO TABS: 50.0000 mg | ORAL_TABLET | Freq: Three times a day (TID) | ORAL | 0 refills | Status: DC | PRN

## 2018-08-24 MED FILL — traMADol HCL 50 MG TABS: 50 | 2 days supply | Qty: 10 | Fill #0

## 2018-08-24 NOTE — Progress Notes (Signed)
Subjective  CC:  Chief Complaint  Patient presents with  . Knee Pain    Right knee.. Started 1 week ago.. Taking IBU with minimal relief.. Swelling, painful, and popping   Same day acute visit; PCP not available.  Chart reviewed.   HPI: Katelyn Reyes is a 58 y.o. female who presents to the office today to address the problems listed above in the chief complaint.  59 year old female who recently started kickboxing about 5 weeks ago presents for acute knee pain with swelling.  Pain started over the last week.  No injury.  Now painful even at rest.  Painful with walking.  No locking, give way symptoms.  She does report that she had prior mild intermittent knee pain with climbing stairs etc.  No documented history arthritis in the knee.  Has never been imaged.  She said no associated symptoms including no fevers or calf pain.  She has been using ibuprofen 800 mg 3 times daily pretty regularly over the last week.  Pain has persisted.  She is used some icing.  She has used tramadol in the past and tolerates it well.  She is also used meloxicam for foot pain.   Assessment  1. Acute pain of right knee   2. Effusion of right knee      Plan   Acute pain with effusion of the right knee: Suspect osteoarthritic flare due to recent kickboxing.  Status post aspiration of effusion and steroid injection.  Patient tolerated well and felt better on discharge.  Recommend routine postprocedure care including icing and Ultram as needed.  Recommend rest for the next 2 weeks.  Avoid kickboxing.  Follow-up if not improving.  Recommend x-rays to evaluate joint space.  Follow up: Return if symptoms worsen or fail to improve.  Visit date not found  Orders Placed This Encounter  Procedures  . DG Knee AP/LAT W/Sunrise Right   Meds ordered this encounter  Medications  . traMADol (ULTRAM) 50 MG tablet    Sig: Take 1 tablet (50 mg total) by mouth every 8 (eight) hours as needed.    Dispense:  10 tablet   Refill:  0      I reviewed the patients updated PMH, FH, and SocHx.    Patient Active Problem List   Diagnosis Date Noted  . Obesity (BMI 30-39.9) 07/12/2018  . Status post surgery 11/12/2017  . Cervical spondylosis with radiculopathy 11/12/2017  . Vitamin D deficiency 07/07/2017  . Hearing loss of left ear due to cerumen impaction 11/19/2015  . Urinary incontinence in female 12/22/2013  . Asthma, mild intermittent 03/22/2013  . Vaginal itching 06/01/2012  . Thyromegaly 03/18/2012  . General medical examination 03/14/2011  . CHICKENPOX, HX OF 02/15/2010  . Hyperlipidemia 02/14/2009  . DEPRESSION 02/14/2009  . ALLERGIC RHINITIS 02/14/2009   Current Meds  Medication Sig  . albuterol (VENTOLIN HFA) 108 (90 Base) MCG/ACT inhaler INHALE 2 PUFFS INTO THE LUNGS EVERY 6 HOURS AS NEEDED FOR WHEEZING.  . ALPRAZolam (XANAX) 0.5 MG tablet Take 1 tablet (0.5 mg total) by mouth 3 (three) times daily as needed.  . cetirizine (ZYRTEC) 10 MG tablet TAKE 1 TABLET (10 MG TOTAL) BY MOUTH DAILY.  Marland Kitchen Cholecalciferol (VITAMIN D3) 2000 units TABS Take 2,000 Units by mouth daily.  Marland Kitchen estradiol (ESTRACE) 1 MG tablet Take 1 mg by mouth daily.   . famotidine (PEPCID) 20 MG tablet Take 20 mg by mouth 2 (two) times daily.   . fluticasone (FLONASE) 50 MCG/ACT  nasal spray Place 2 sprays into both nostrils daily.  Marland Kitchen levonorgestrel (MIRENA) 20 MCG/24HR IUD 1 each by Intrauterine route once.    . montelukast (SINGULAIR) 10 MG tablet Take 1 tablet (10 mg total) by mouth daily.  . Multiple Vitamin (MULTIVITAMIN WITH MINERALS) TABS tablet Take 1 tablet by mouth daily.  . sertraline (ZOLOFT) 100 MG tablet Take 1 tablet (100 mg total) by mouth daily.  . vitamin B-12 (CYANOCOBALAMIN) 1000 MCG tablet Take 1,000 mcg by mouth daily.  Marland Kitchen zolpidem (AMBIEN) 10 MG tablet Take 1 tablet (10 mg total) by mouth at bedtime as needed. for sleep    Allergies: Patient is allergic to amoxicillin; erythromycin;  hydrocodone-acetaminophen; and shrimp [shellfish allergy]. Family History: Patient family history includes Breast cancer in an other family member; Colon cancer in her paternal grandmother; Diabetes in her father; Fibrocystic breast disease in her mother; Hypertension in her father; Stroke in her maternal grandmother. Social History:  Patient  reports that she has never smoked. She has never used smokeless tobacco. She reports that she does not drink alcohol or use drugs.  Review of Systems: Constitutional: Negative for fever malaise or anorexia Cardiovascular: negative for chest pain Respiratory: negative for SOB or persistent cough Gastrointestinal: negative for abdominal pain  Objective  Vitals: BP 118/72   Temp 98.1 F (36.7 C) (Oral)   Resp 16   Ht 5\' 3"  (1.6 m)   Wt 202 lb 6.4 oz (91.8 kg)   BMI 35.85 kg/m  General: no acute distress , A&Ox3 Right knee: warm w/o erytema + suprapatellar effusion, FROM w/o crepitus, no laxity, no joint ttp, neg mcmurrays neg lachmans. Normal gait  Knee Arthrocentesis with Injection Procedure Note  Pre-operative Diagnosis: right knee pain, OA with effusion  Post-operative Diagnosis: same  Indications: Symptomatic relief of effusion and inflammation  Anesthesia: Lidocaine 1% without epinephrine without added sodium bicarbonate  Procedure Details   Verbal consent was obtained for the procedure. Universal time out taken.  The Knee joint was prepped with alcohol and an 18 gauge needle was inserted into the joint from the lateral approach. 10 ml of clear yellow fluid was removed from the joint and discarded. Four ml 1% lidocaine and one ml of triamcinolone (KENALOG) 40mg /ml was then injected into the joint through the same needle. The needle was removed and the area cleansed and dressed.  Complications:  None; patient tolerated the procedure well.    Commons side effects, risks, benefits, and alternatives for medications and treatment plan  prescribed today were discussed, and the patient expressed understanding of the given instructions. Patient is instructed to call or message via MyChart if he/she has any questions or concerns regarding our treatment plan. No barriers to understanding were identified. We discussed Red Flag symptoms and signs in detail. Patient expressed understanding regarding what to do in case of urgent or emergency type symptoms.   Medication list was reconciled, printed and provided to the patient in AVS. Patient instructions and summary information was reviewed with the patient as documented in the AVS. This note was prepared with assistance of Dragon voice recognition software. Occasional wrong-word or sound-a-like substitutions may have occurred due to the inherent limitations of voice recognition software

## 2018-08-24 NOTE — Addendum Note (Signed)
Addended by: Layla Barter on: 08/24/2018 04:44 PM   Modules accepted: Orders

## 2018-08-24 NOTE — Patient Instructions (Addendum)
Please follow up if symptoms do not improve or as needed.  Please avoid strenuous activities like kick boxing for the next 2 weeks.   Use ice and /or tramadol as needed over the next day or two until steroid injections starts to help.   Please go to our [facility] office to get your xrays done. You can walk in M-F between 8am and 5pm. Tell them you are there for xrays ordered by me. They will send me the results, then I will let you know the results with instructions.   Address: [place], [place], [place]  (office sits at [address] at [address] intersection; from here, turn left onto [address]), take to [address], turn right and go for a mile or so, office will be on left across form [employer] )   You had a steroid injection today.   Things to be aware of after this injection are listed below:  You may experience no significant improvement or even a slight worsening in your symptoms during the first 24 to 48 hours.  After that we expect your symptoms to improve gradually over the next 2 weeks for the medicine to have its maximal effect.  You should continue to have improvement out to 6 weeks after your injection.  I recommend icing the site of the injection for 20 minutes  1-2 times the day of your injection  You may shower but no swimming, tub bath or Jacuzzi for 24 hours.  If your bandage falls off this does not need to be replaced.  It is appropriate to remove the bandage after 4 hours.  You may resume light activities as tolerated.     POSSIBLE PROCEDURE SIDE EFFECTS: The side effects of the injection are usually fairly minimal however if you may experience some of the following side effects that are usually self-limited and will is off on their own.  If you are concerned please feel free to call the office with questions:             Increased numbness or tingling             Nausea or vomiting        Swelling or bruising at the injection site    Please call our office if if you experience any of the following symptoms over the next week as these can be signs of infection:              Fever greater than 100.20F             Significant swelling at the injection site             Significant redness or drainage from the injection site    Acute Knee Pain, Adult Acute knee pain is sudden and may be caused by damage, swelling, or irritation of the muscles and tissues that support your knee. The injury may result from:  A fall.  An injury to your knee from twisting motions.  A hit to the knee.  Infection. Acute knee pain may go away on its own with time and rest. If it does not, your health care provider may order tests to find the cause of the pain. These may include:  Imaging tests, such as an X-ray, MRI, or ultrasound.  Joint aspiration. In this test, fluid is removed from the knee.  Arthroscopy. In this test, a lighted tube is inserted into the  knee and an image is projected onto a TV screen.  Biopsy. In this test, a sample of tissue is removed from the body and studied under a microscope. Follow these instructions at home: Pay attention to any changes in your symptoms. Take these actions to relieve your pain. If you have a knee sleeve or brace:   Wear the sleeve or brace as told by your health care provider. Remove it only as told by your health care provider.  Loosen the sleeve or brace if your toes tingle, become numb, or turn cold and blue.  Keep the sleeve or brace clean.  If the sleeve or brace is not waterproof: ? Do not let it get wet. ? Cover it with a watertight covering when you take a bath or shower. Activity  Rest your knee.  Do not do things that cause pain or make pain worse.  Avoid high-impact activities or exercises, such as running, jumping rope, or doing jumping jacks.  Work with a physical therapist to make a safe exercise program, as  recommended by your health care provider. Do exercises as told by your physical therapist. Managing pain, stiffness, and swelling   If directed, put ice on the knee: ? Put ice in a plastic bag. ? Place a towel between your skin and the bag. ? Leave the ice on for 20 minutes, 2-3 times a day.  If directed, use an elastic bandage to put pressure (compression) on your injured knee. This may control swelling, give support, and help with discomfort. General instructions  Take over-the-counter and prescription medicines only as told by your health care provider.  Raise (elevate) your knee above the level of your heart when you are sitting or lying down.  Sleep with a pillow under your knee.  Do not use any products that contain nicotine or tobacco, such as cigarettes, e-cigarettes, and chewing tobacco. These can delay healing. If you need help quitting, ask your health care provider.  If you are overweight, work with your health care provider and a dietitian to set a weight-loss goal that is healthy and reasonable for you. Extra weight can put pressure on your knee.  Keep all follow-up visits as told by your health care provider. This is important. Contact a health care provider if:  Your knee pain continues, changes, or gets worse.  You have a fever along with knee pain.  Your knee feels warm to the touch.  Your knee buckles or locks up. Get help right away if:  Your knee swells, and the swelling becomes worse.  You cannot move your knee.  You have severe pain in your knee. Summary  Acute knee pain can be caused by a fall, an injury, an infection, or damage, swelling, or irritation of the tissues that support your knee.  Your health care provider may perform tests to find out the cause of the pain.  Pay attention to any changes in your symptoms. Relieve your pain with rest, medicines, light activity, and use of ice.  Get help if your pain continues or becomes worse, your  knee swells, or you cannot move your knee. This information is not intended to replace advice given to you by your health care provider. Make sure you discuss any questions you have with your health care provider. Document Released: 03/30/2007 Document Revised: 11/12/2017 Document Reviewed: 11/12/2017 Elsevier Interactive Patient Education  2019 [employer].

## 2018-08-25 ENCOUNTER — Ambulatory Visit (INDEPENDENT_AMBULATORY_CARE_PROVIDER_SITE_OTHER): Payer: 59

## 2018-08-25 DIAGNOSIS — M1711 Unilateral primary osteoarthritis, right knee: Secondary | ICD-10-CM | POA: Diagnosis not present

## 2018-08-25 DIAGNOSIS — M25561 Pain in right knee: Secondary | ICD-10-CM | POA: Diagnosis not present

## 2018-08-26 NOTE — Progress Notes (Signed)
Please call patient: I have reviewed his/her lab results. Xray confirms knee osteoarthritis. Hope she is feeling better. Follow up as directed.

## 2018-09-03 MED FILL — VENTOLIN HFA 90 MCG INHALER: 108 (90 BAS | 25 days supply | Qty: 18 | Fill #0

## 2018-09-03 MED FILL — FLUTICASONE PROP 50 MCG SPR: 50 | 30 days supply | Qty: 16 | Fill #0

## 2018-12-08 ENCOUNTER — Other Ambulatory Visit: Payer: Self-pay | Admitting: Family Medicine

## 2018-12-08 DIAGNOSIS — N6489 Other specified disorders of breast: Secondary | ICD-10-CM

## 2018-12-08 MED FILL — ZOLPIDEM TARTRATE 10 MG TAB: 10 | 30 days supply | Qty: 30 | Fill #1

## 2018-12-08 MED FILL — FLUTICASONE PROP 50 MCG SPR: 50 | 30 days supply | Qty: 16 | Fill #1

## 2018-12-08 MED FILL — SERTRALINE HCL 100 MG TAB: 100 | 90 days supply | Qty: 90 | Fill #1

## 2018-12-09 MED FILL — ESTRADIOL 1 MG TABLET: 1 | 90 days supply | Qty: 90 | Fill #0

## 2018-12-14 ENCOUNTER — Ambulatory Visit
Admission: RE | Admit: 2018-12-14 | Discharge: 2018-12-14 | Disposition: A | Discharge status: Home / Self Care | Payer: 59 | Source: Ambulatory Visit | Attending: Family Medicine | Admitting: Family Medicine

## 2018-12-14 ENCOUNTER — Other Ambulatory Visit: Payer: Self-pay

## 2018-12-14 ENCOUNTER — Other Ambulatory Visit: Payer: Self-pay | Admitting: Family Medicine

## 2018-12-14 DIAGNOSIS — R922 Inconclusive mammogram: Secondary | ICD-10-CM | POA: Diagnosis not present

## 2018-12-14 DIAGNOSIS — N6489 Other specified disorders of breast: Secondary | ICD-10-CM

## 2019-01-19 DIAGNOSIS — Z3040 Encounter for surveillance of contraceptives, unspecified: Secondary | ICD-10-CM | POA: Diagnosis not present

## 2019-01-19 DIAGNOSIS — Z1231 Encounter for screening mammogram for malignant neoplasm of breast: Secondary | ICD-10-CM | POA: Diagnosis not present

## 2019-01-19 DIAGNOSIS — Z975 Presence of (intrauterine) contraceptive device: Secondary | ICD-10-CM | POA: Diagnosis not present

## 2019-01-19 DIAGNOSIS — Z6836 Body mass index (BMI) 36.0-36.9, adult: Secondary | ICD-10-CM | POA: Diagnosis not present

## 2019-01-19 DIAGNOSIS — Z124 Encounter for screening for malignant neoplasm of cervix: Secondary | ICD-10-CM | POA: Diagnosis not present

## 2019-01-19 DIAGNOSIS — N951 Menopausal and female climacteric states: Secondary | ICD-10-CM | POA: Diagnosis not present

## 2019-01-19 DIAGNOSIS — Z01419 Encounter for gynecological examination (general) (routine) without abnormal findings: Secondary | ICD-10-CM | POA: Diagnosis not present

## 2019-01-20 ENCOUNTER — Other Ambulatory Visit: Payer: Self-pay | Admitting: Family Medicine

## 2019-01-20 LAB — HM PAP SMEAR

## 2019-01-20 MED FILL — FLUTICASONE PROP 50 MCG SPR: 50 | 30 days supply | Qty: 16 | Fill #2

## 2019-01-20 MED FILL — PROGESTERONE MICRONIZED 200: 200 | 90 days supply | Qty: 90 | Fill #0

## 2019-01-21 NOTE — Telephone Encounter (Signed)
Ambien  Last refill: 1.27.20 #30, 3  Last OV:3.10.20    Xanax  Last refill: 1.27.20 #60, 3 Last OV: 3.10.20

## 2019-01-24 MED FILL — ALPRAZolam 0.5 MG TABS: 0.5 | 20 days supply | Qty: 60 | Fill #0

## 2019-01-24 MED FILL — ZOLPIDEM TARTRATE 10 MG TAB: 10 | 30 days supply | Qty: 30 | Fill #0

## 2019-02-07 MED FILL — MONTELUKAST SOD 10 MG TAB: 10 | 90 days supply | Qty: 90 | Fill #1

## 2019-02-08 ENCOUNTER — Encounter: Payer: Self-pay | Admitting: General Practice

## 2019-03-10 ENCOUNTER — Encounter: Payer: Self-pay | Admitting: Family Medicine

## 2019-03-10 ENCOUNTER — Telehealth: Payer: 59 | Admitting: Family Medicine

## 2019-03-10 ENCOUNTER — Ambulatory Visit (INDEPENDENT_AMBULATORY_CARE_PROVIDER_SITE_OTHER): Payer: 59 | Admitting: Family Medicine

## 2019-03-10 VITALS — Ht 63.0 in | Wt 202.0 lb

## 2019-03-10 DIAGNOSIS — K219 Gastro-esophageal reflux disease without esophagitis: Secondary | ICD-10-CM

## 2019-03-10 DIAGNOSIS — R1013 Epigastric pain: Secondary | ICD-10-CM

## 2019-03-10 MED ORDER — DICYCLOMINE HCL 10 MG PO CAPS: 10.0000 mg | ORAL_CAPSULE | Freq: Three times a day (TID) | ORAL | 0 refills | Status: DC

## 2019-03-10 MED ORDER — PANTOPRAZOLE SODIUM 40 MG PO TBEC: DELAYED_RELEASE_TABLET | ORAL | 0 refills | Status: DC

## 2019-03-10 MED FILL — PANTOPRAZOLE SOD DR 40 MG T: 40 | 60 days supply | Qty: 60 | Fill #0

## 2019-03-10 MED FILL — DICYCLOMINE 10 MG CAPSULE: 10 | 8 days supply | Qty: 30 | Fill #0

## 2019-03-10 NOTE — Progress Notes (Signed)
Patient: Katelyn Reyes MRN: [id number] DOB: 06-10-1961 PCP: Midge Minium, MD     I connected with [name] on 03/10/19 a 1:16pm by a video enabled telemedicine application and verified that I am speaking with the correct person using two identifiers.  Location patient: Home Location provider: [facility], Office Persons participating in this virtual visit: Sheran Spine and Dr. Rogers Blocker   I discussed the limitations of evaluation and management by telemedicine and the availability of in person appointments. The patient expressed understanding and agreed to proceed.   Subjective:  Chief Complaint  Patient presents with  . Abdominal Pain    x 3 days    HPI: The patient is a 58 y.o. female who presents today for epigastric pain x 3 days.  Has also had some diarrhea as well as nausea also.  Denies any fever or vomiting but does admit to excessive belching and bloating.  Pain does not radiate anywhere else. Symptoms started on Sunday and have progressively gotten worse. She has had abdominal cramping, bloating, N/V. When she eats she gets nauseated and feels bad. Pain is worse with food. She has had some nausea, diarrhea, but no vomiting. She has had an endoscopy and was told she had early ulcers. She did take pepcid and it didn't help. She takes this prn. She has had her gallbladder removed. Pain in her epigastric area with radiation up her throat. Pain is a 4/10 and if she eats anything it's an 8/10. She does not eat fried/greasy/fatty foods. She only drinks 1 cup of coffee/day but this has really made it worse, so she stopped this. No other caffeine. No alcohol and she does not smoke. Her stool is not dark/tarry in appearance and  No blood. She is more stressed lately and covid is really wearing on her. The burning in her throat is constant, but much worse with food. No one else in her house has similar symptoms.   Review of Systems  Constitutional: Negative for chills,  fatigue and fever.  HENT: Negative for congestion, postnasal drip, rhinorrhea and sore throat.   Eyes: Negative for photophobia and pain.  Respiratory: Negative for cough and shortness of breath.   Cardiovascular: Negative for chest pain, palpitations and leg swelling.  Gastrointestinal: Positive for abdominal pain, diarrhea and nausea. Negative for blood in stool and vomiting.  Musculoskeletal: Negative for back pain, myalgias and neck pain.  Neurological: Negative for dizziness and headaches.  Psychiatric/Behavioral: Negative for sleep disturbance.    Allergies Patient is allergic to amoxicillin; erythromycin; hydrocodone-acetaminophen; and shrimp [shellfish allergy].  Past Medical History Patient  has a past medical history of Allergic rhinitis, Anxiety, Asthma, Breast calcification, right (02/02/2013), Depression, GERD (gastroesophageal reflux disease), Hyperlipidemia, and IUD (02/07/10).  Surgical History Patient  has a past surgical history that includes Mouth surgery; Nasal sinus surgery; Burch procedure (N/A, 12/22/2013); Cholecystectomy (N/A, 10/09/2017); Cervical disc arthroplasty (N/A, 11/12/2017); Breast biopsy (Right, 02/17/2013); and Breast excisional biopsy (Right, pt unsure).  Family History Pateint's family history includes Breast cancer in an other family member; Colon cancer in her paternal grandmother; Diabetes in her father; Fibrocystic breast disease in her mother; Hypertension in her father; Stroke in her maternal grandmother.  Social History Patient  reports that she has never smoked. She has never used smokeless tobacco. She reports that she does not drink alcohol or use drugs.    Objective: Vitals:   03/10/19 1302  Weight: 202 lb (91.6 kg)  Height: 5\' 3"  (1.6 m)  Body mass index is 35.78 kg/m.  Physical Exam Vitals signs reviewed.  Constitutional:      Appearance: She is well-developed. She is obese.  HENT:     Head: Normocephalic and atraumatic.   Pulmonary:     Effort: Pulmonary effort is normal.  Abdominal:     Tenderness: There is abdominal tenderness (when she presses on these areas herself) in the epigastric area and periumbilical area.  Neurological:     General: No focal deficit present.     Mental Status: She is alert.  Psychiatric:        Mood and Affect: Mood normal.        Behavior: Behavior normal.        Assessment/plan:  1. Gastroesophageal reflux disease, esophagitis presence not specified Starting protonix back. Diet seems GERD appropriate. If not better with medication she may need scoped again.   2. Epigastric pain Gastritis vs. Stress ulcer vs. Hiatal hernia. No hernia on last egd done in 09/2017. Seen by dr. Collene Mares. Starting her on PPI bid x 2 weeks then once/day. ER precautions given for worsening pain, coffee ground emesis or black stool. If not better with medication advised she needs to f/u with PCP for labs and exam, otherwise f/u in 3 months time.   For cramping gave short supply of bentyl, but again, if not better needs to f/u with pcp. Continue brat diet and GERD diet.       Return in about 3 months (around 06/09/2019) for f/u protonix. sooner if not better .   Orma Flaming, MD [place]  03/10/2019

## 2019-03-22 DIAGNOSIS — K219 Gastro-esophageal reflux disease without esophagitis: Secondary | ICD-10-CM | POA: Diagnosis not present

## 2019-03-22 DIAGNOSIS — R110 Nausea: Secondary | ICD-10-CM | POA: Diagnosis not present

## 2019-03-22 DIAGNOSIS — R1013 Epigastric pain: Secondary | ICD-10-CM | POA: Diagnosis not present

## 2019-03-22 DIAGNOSIS — R194 Change in bowel habit: Secondary | ICD-10-CM | POA: Diagnosis not present

## 2019-03-23 ENCOUNTER — Other Ambulatory Visit: Payer: Self-pay | Admitting: Gastroenterology

## 2019-03-23 ENCOUNTER — Other Ambulatory Visit (HOSPITAL_COMMUNITY): Payer: Self-pay | Admitting: Gastroenterology

## 2019-03-23 DIAGNOSIS — R1033 Periumbilical pain: Secondary | ICD-10-CM | POA: Diagnosis not present

## 2019-03-23 DIAGNOSIS — K219 Gastro-esophageal reflux disease without esophagitis: Secondary | ICD-10-CM | POA: Diagnosis not present

## 2019-03-23 DIAGNOSIS — R1013 Epigastric pain: Secondary | ICD-10-CM | POA: Diagnosis not present

## 2019-03-25 DIAGNOSIS — K219 Gastro-esophageal reflux disease without esophagitis: Secondary | ICD-10-CM | POA: Insufficient documentation

## 2019-03-31 ENCOUNTER — Encounter (HOSPITAL_COMMUNITY): Payer: Self-pay

## 2019-03-31 ENCOUNTER — Ambulatory Visit (HOSPITAL_COMMUNITY): Payer: 59

## 2019-04-04 DIAGNOSIS — H52223 Regular astigmatism, bilateral: Secondary | ICD-10-CM | POA: Diagnosis not present

## 2019-04-04 DIAGNOSIS — H5213 Myopia, bilateral: Secondary | ICD-10-CM | POA: Diagnosis not present

## 2019-04-04 DIAGNOSIS — Z135 Encounter for screening for eye and ear disorders: Secondary | ICD-10-CM | POA: Diagnosis not present

## 2019-04-04 DIAGNOSIS — H524 Presbyopia: Secondary | ICD-10-CM | POA: Diagnosis not present

## 2019-04-19 MED FILL — ZOLPIDEM TARTRATE 10 MG TAB: 10 | 30 days supply | Qty: 30 | Fill #1

## 2019-05-02 ENCOUNTER — Other Ambulatory Visit: Payer: Self-pay | Admitting: Family Medicine

## 2019-05-02 MED FILL — PANTOPRAZOLE SOD DR 40 MG T: 40 | 90 days supply | Qty: 90 | Fill #0

## 2019-05-02 MED FILL — PROGESTERONE MICRONIZED 200: 200 | 90 days supply | Qty: 90 | Fill #1

## 2019-05-02 MED FILL — MONTELUKAST SOD 10 MG TAB: 10 | 90 days supply | Qty: 90 | Fill #0

## 2019-05-02 MED FILL — SERTRALINE HCL 100 MG TAB: 100 | 90 days supply | Qty: 90 | Fill #0

## 2019-05-03 ENCOUNTER — Other Ambulatory Visit: Payer: Self-pay | Admitting: General Practice

## 2019-05-03 MED ORDER — PANTOPRAZOLE SODIUM 40 MG PO TBEC: DELAYED_RELEASE_TABLET | ORAL | 3 refills | Status: DC

## 2019-05-03 MED FILL — ESTRADIOL 1 MG TABLET: 1 | 90 days supply | Qty: 90 | Fill #0

## 2019-06-10 IMAGING — CT CT ABD-PELV W/ CM
2 of 5 series · 16 of 46 positions shown, 18 images · IV contrast (iopamidol)
Comparison: Ultrasound dated 04/28/2017 and hepatobiliary scan
dated 05/08/2017

CLINICAL DATA: Right upper quadrant abdominal pain.

EXAM:
CT ABDOMEN AND PELVIS WITH CONTRAST
TECHNIQUE: Multidetector CT imaging of the abdomen and pelvis was performed
using the standard protocol following bolus administration of
intravenous contrast.
CONTRAST:  100mL HNNVSP-UFF IOPAMIDOL (HNNVSP-UFF) INJECTION 61%

[Series 2: abd pelvis 5.00 br40 s3 ax · axial · 0.75mm/px · z∈[+1430,+1825]mm · 13 of 89 slices shown, 15 images]
[im 5/89  soft-tissue]
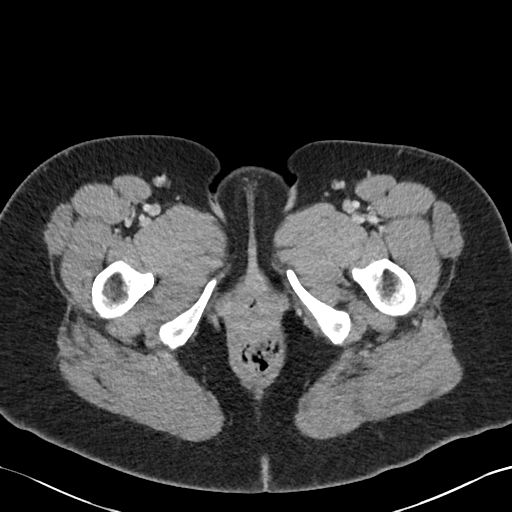
[im 5/89  bone]
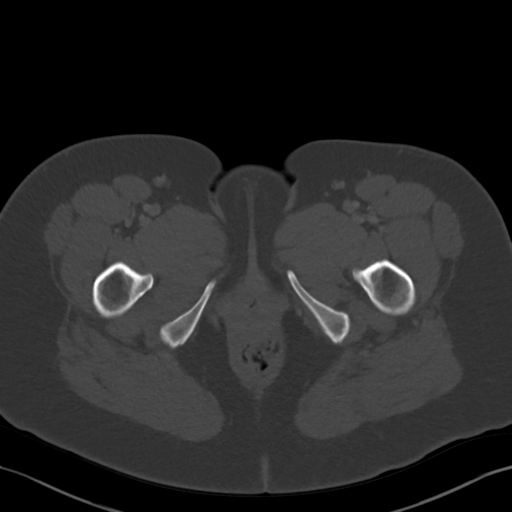
[im 14/89  soft-tissue]
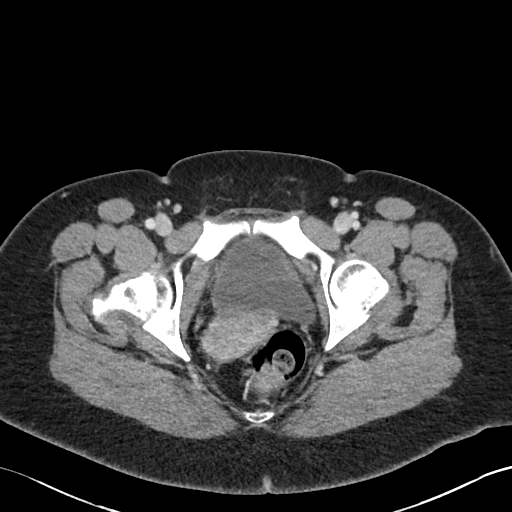
[im 18/89  soft-tissue]
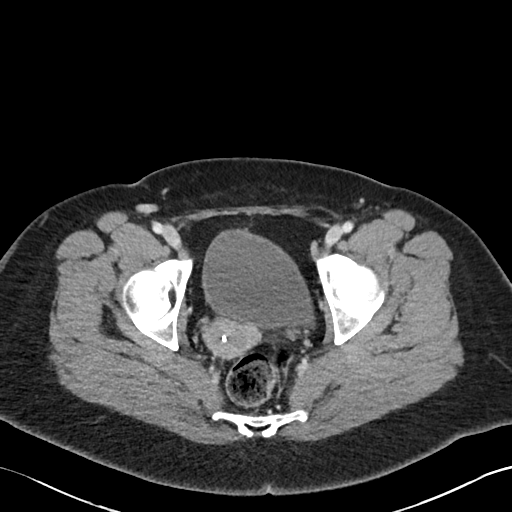
[im 27/89  soft-tissue]
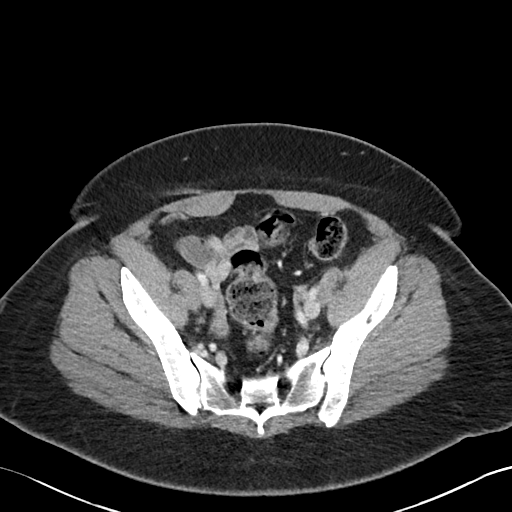
[im 31/89  soft-tissue]
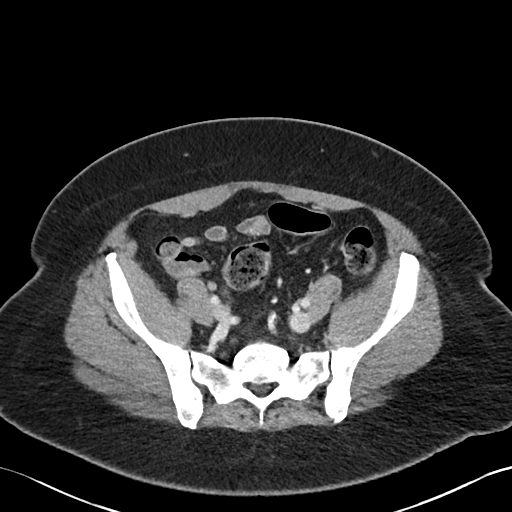
[im 40/89  soft-tissue]
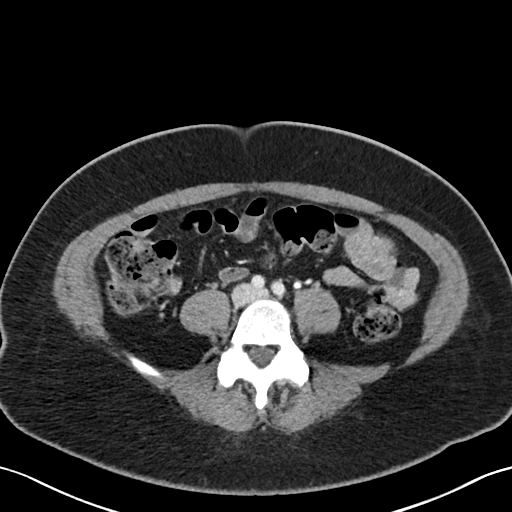
[im 45/89  soft-tissue]
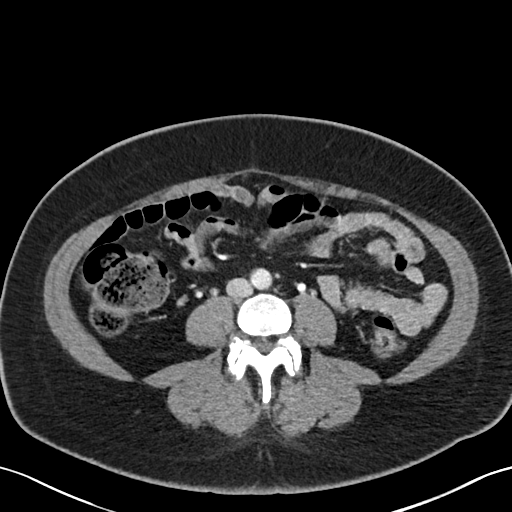
[im 49/89  soft-tissue]
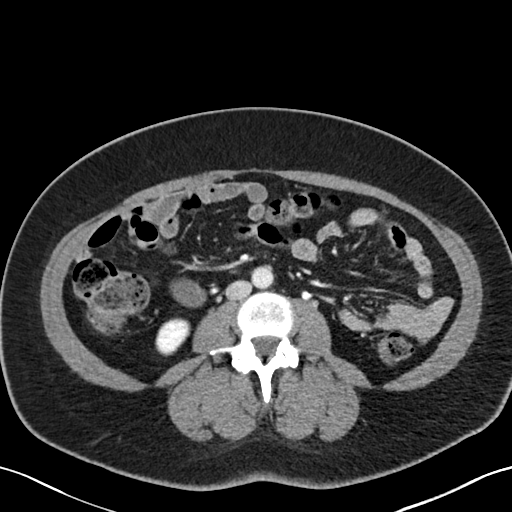
[im 58/89  soft-tissue]
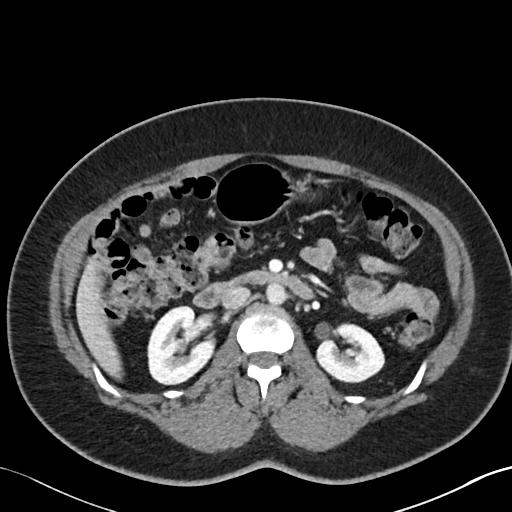
[im 58/89  bone]
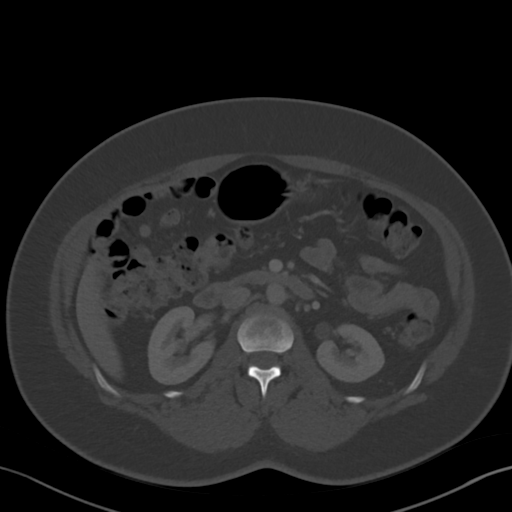
[im 62/89  soft-tissue]
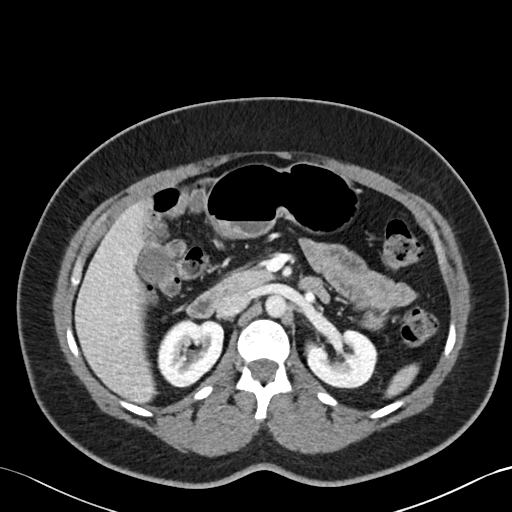
[im 71/89  soft-tissue]
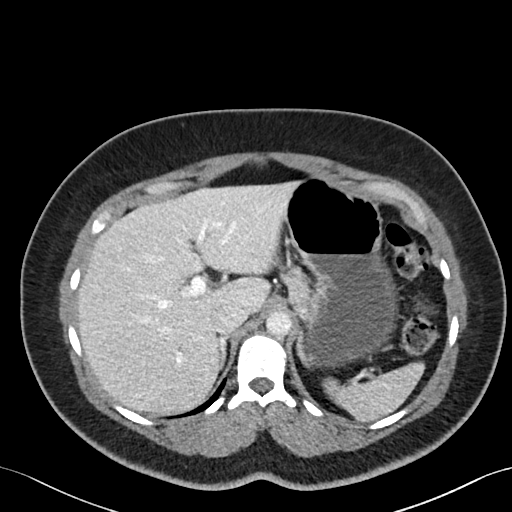
[im 75/89  soft-tissue]
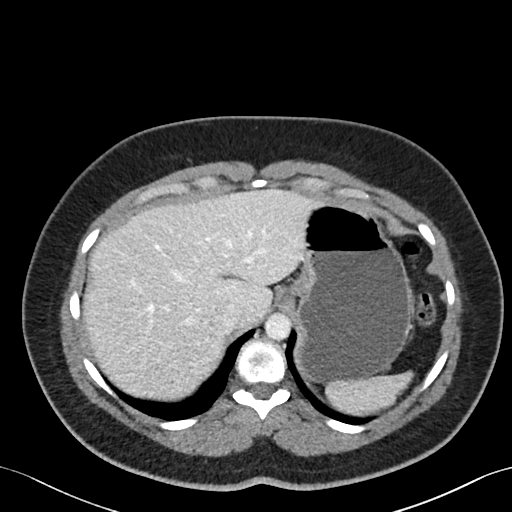
[im 84/89  soft-tissue]
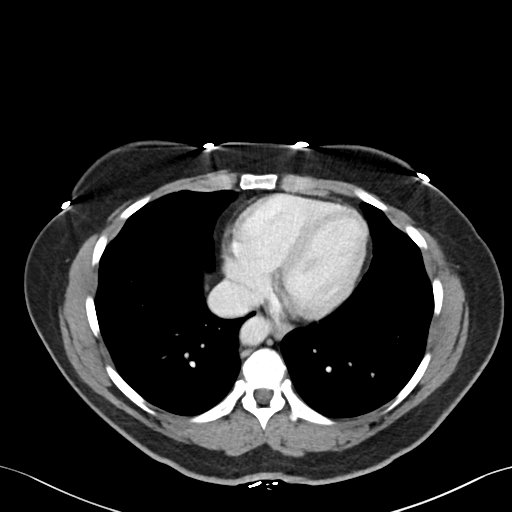

[Series 6: abd pelvis 2.00 br40 s3 cor · coronal · 0.75mm/px · 3 of 151 slices shown]
[im 51/151  soft-tissue]
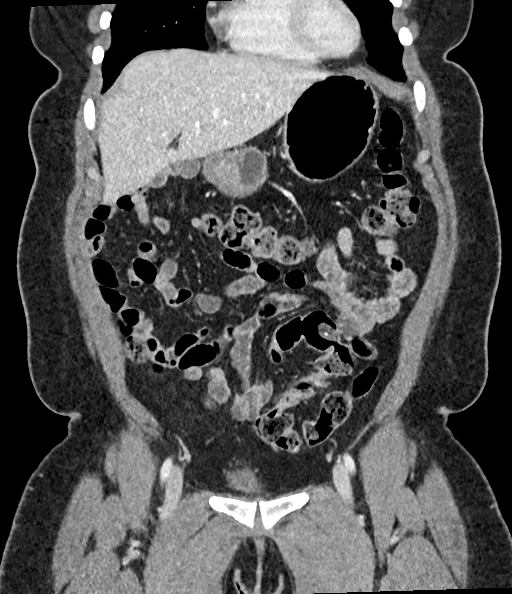
[im 67/151  soft-tissue]
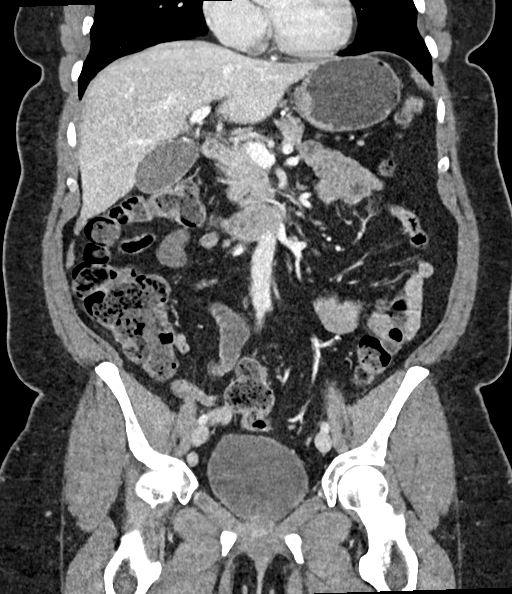
[im 84/151  soft-tissue]
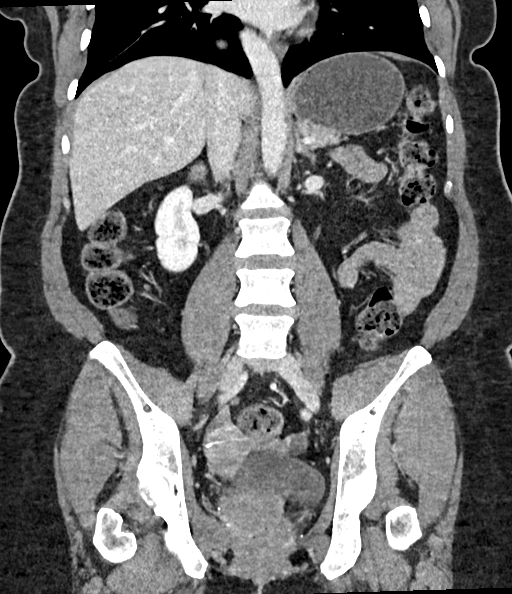

[16 of 46 positions shown; findings below may reference images not displayed]

FINDINGS: Lower chest: Normal.

Hepatobiliary: No focal liver abnormality is seen. No gallstones,
gallbladder wall thickening, or biliary dilatation.

Pancreas: Unremarkable. No pancreatic ductal dilatation or
surrounding inflammatory changes.

Spleen: Normal in size without focal abnormality.

Adrenals/Urinary Tract: Adrenal glands are unremarkable. Kidneys are
normal, without renal calculi, focal lesion, or hydronephrosis.
Bladder is unremarkable.

Stomach/Bowel: Stomach is within normal limits. Appendix appears
normal. No evidence of bowel wall thickening, distention, or
inflammatory changes.

Vascular/Lymphatic: No significant vascular findings are present. No
enlarged abdominal or pelvic lymph nodes.

Reproductive: IUD in place. 17 mm cyst on the left ovary, not felt
to be significant. Normal right ovary.

Other: No abdominal wall hernia or abnormality. No abdominopelvic
ascites.

Musculoskeletal: No acute abnormality. Chronic severe bilateral
facet arthritis at L4-5 and L5-S1.
IMPRESSION: No significant abnormality of the abdomen or pelvis.

Severe degenerative facet arthritis at L4-5 and L5-S1.

## 2019-06-14 MED FILL — ZOLPIDEM TARTRATE 10 MG TAB: 10 | 30 days supply | Qty: 30 | Fill #2

## 2019-06-20 ENCOUNTER — Other Ambulatory Visit: Payer: Self-pay

## 2019-06-20 ENCOUNTER — Other Ambulatory Visit: Payer: Self-pay | Admitting: Family Medicine

## 2019-06-20 ENCOUNTER — Ambulatory Visit
Admission: RE | Admit: 2019-06-20 | Discharge: 2019-06-20 | Disposition: A | Discharge status: Home / Self Care | Payer: 59 | Source: Ambulatory Visit | Attending: Family Medicine | Admitting: Family Medicine

## 2019-06-20 DIAGNOSIS — N6489 Other specified disorders of breast: Secondary | ICD-10-CM | POA: Diagnosis not present

## 2019-06-20 DIAGNOSIS — R922 Inconclusive mammogram: Secondary | ICD-10-CM | POA: Diagnosis not present

## 2019-06-29 ENCOUNTER — Ambulatory Visit
Admission: RE | Admit: 2019-06-29 | Discharge: 2019-06-29 | Disposition: A | Discharge status: Home / Self Care | Payer: 59 | Source: Ambulatory Visit | Attending: Family Medicine | Admitting: Family Medicine

## 2019-06-29 ENCOUNTER — Other Ambulatory Visit: Payer: Self-pay

## 2019-06-29 DIAGNOSIS — N6489 Other specified disorders of breast: Secondary | ICD-10-CM

## 2019-06-29 DIAGNOSIS — N6022 Fibroadenosis of left breast: Secondary | ICD-10-CM | POA: Diagnosis not present

## 2019-06-29 DIAGNOSIS — N6342 Unspecified lump in left breast, subareolar: Secondary | ICD-10-CM | POA: Diagnosis not present

## 2019-07-15 ENCOUNTER — Ambulatory Visit (INDEPENDENT_AMBULATORY_CARE_PROVIDER_SITE_OTHER): Payer: 59 | Admitting: Family Medicine

## 2019-07-15 ENCOUNTER — Encounter: Payer: Self-pay | Admitting: Family Medicine

## 2019-07-15 ENCOUNTER — Other Ambulatory Visit: Payer: Self-pay

## 2019-07-15 VITALS — BP 117/80 | HR 78 | Temp 97.8°F | Resp 16 | Ht 63.0 in | Wt 202.5 lb

## 2019-07-15 DIAGNOSIS — N898 Other specified noninflammatory disorders of vagina: Secondary | ICD-10-CM | POA: Diagnosis not present

## 2019-07-15 DIAGNOSIS — E559 Vitamin D deficiency, unspecified: Secondary | ICD-10-CM | POA: Diagnosis not present

## 2019-07-15 DIAGNOSIS — E669 Obesity, unspecified: Secondary | ICD-10-CM | POA: Diagnosis not present

## 2019-07-15 DIAGNOSIS — Z0000 Encounter for general adult medical examination without abnormal findings: Secondary | ICD-10-CM

## 2019-07-15 LAB — BASIC METABOLIC PANEL
BUN: 10 mg/dL (ref 6–23)
CO2: 27 mEq/L (ref 19–32)
Calcium: 9.4 mg/dL (ref 8.4–10.5)
Chloride: 106 mEq/L (ref 96–112)
Creatinine, Ser: 0.86 mg/dL (ref 0.40–1.20)
GFR: 82.56 mL/min (ref >60.00)
Glucose, Bld: 108 mg/dL — ABNORMAL HIGH (ref 70–99)
Potassium: 3.9 mEq/L (ref 3.5–5.1)
Sodium: 140 mEq/L (ref 135–145)

## 2019-07-15 LAB — CBC WITH DIFFERENTIAL/PLATELET
Basophils Absolute: 0.1 10*3/uL (ref 0.0–0.1)
Basophils Relative: 1.3 % (ref 0.0–3.0)
Eosinophils Absolute: 0.1 10*3/uL (ref 0.0–0.7)
Eosinophils Relative: 2.3 % (ref 0.0–5.0)
HCT: 39.4 % (ref 36.0–46.0)
Hemoglobin: 13.1 g/dL (ref 12.0–15.0)
Lymphocytes Relative: 42.9 % (ref 12.0–46.0)
Lymphs Abs: 2.6 10*3/uL (ref 0.7–4.0)
MCHC: 33.3 g/dL (ref 30.0–36.0)
MCV: 95 fl (ref 78.0–100.0)
Monocytes Absolute: 0.4 10*3/uL (ref 0.1–1.0)
Monocytes Relative: 6.9 % (ref 3.0–12.0)
Neutro Abs: 2.8 10*3/uL (ref 1.4–7.7)
Neutrophils Relative %: 46.6 % (ref 43.0–77.0)
Platelets: 305 10*3/uL (ref 150.0–400.0)
RBC: 4.15 Mil/uL (ref 3.87–5.11)
RDW: 12.9 % (ref 11.5–15.5)
WBC: 6.1 10*3/uL (ref 4.0–10.5)

## 2019-07-15 LAB — LIPID PANEL
Cholesterol: 187 mg/dL (ref 0–200)
HDL: 59 mg/dL (ref >39.00)
LDL Cholesterol: 109 mg/dL — ABNORMAL HIGH (ref 0–99)
NonHDL: 127.51
Total CHOL/HDL Ratio: 3
Triglycerides: 91 mg/dL (ref 0.0–149.0)
VLDL: 18.2 mg/dL (ref 0.0–40.0)

## 2019-07-15 LAB — HEPATIC FUNCTION PANEL
ALT: 18 U/L (ref 0–35)
AST: 17 U/L (ref 0–37)
Albumin: 4.2 g/dL (ref 3.5–5.2)
Alkaline Phosphatase: 76 U/L (ref 39–117)
Bilirubin, Direct: 0.1 mg/dL (ref 0.0–0.3)
Total Bilirubin: 0.4 mg/dL (ref 0.2–1.2)
Total Protein: 7.4 g/dL (ref 6.0–8.3)

## 2019-07-15 LAB — TSH: TSH: 1.9 u[IU]/mL (ref 0.35–4.50)

## 2019-07-15 LAB — VITAMIN D 25 HYDROXY (VIT D DEFICIENCY, FRACTURES): VITD: 31.33 ng/mL (ref 30.00–100.00)

## 2019-07-15 MED ORDER — SERTRALINE HCL 100 MG PO TABS: 100.0000 mg | ORAL_TABLET | Freq: Every day | ORAL | 1 refills | Status: DC

## 2019-07-15 MED ORDER — PANTOPRAZOLE SODIUM 40 MG PO TBEC: DELAYED_RELEASE_TABLET | ORAL | 3 refills | Status: DC

## 2019-07-15 MED ORDER — ALBUTEROL SULFATE HFA 108 (90 BASE) MCG/ACT IN AERS: INHALATION_SPRAY | RESPIRATORY_TRACT | 6 refills | Status: DC

## 2019-07-15 MED ORDER — ALPRAZOLAM 0.5 MG PO TABS: 0.5000 mg | ORAL_TABLET | Freq: Three times a day (TID) | ORAL | 3 refills | Status: DC | PRN

## 2019-07-15 MED ORDER — ZOLPIDEM TARTRATE 10 MG PO TABS: 10.0000 mg | ORAL_TABLET | Freq: Every evening | ORAL | 3 refills | Status: DC | PRN

## 2019-07-15 MED ORDER — CETIRIZINE HCL 10 MG PO TABS: ORAL_TABLET | ORAL | 1 refills | Status: DC

## 2019-07-15 MED ORDER — FLUTICASONE PROPIONATE 50 MCG/ACT NA SUSP: 2.0000 | Freq: Every day | NASAL | 6 refills | Status: DC

## 2019-07-15 MED FILL — SERTRALINE HCL 100 MG TAB: 100 | 90 days supply | Qty: 90 | Fill #0

## 2019-07-15 MED FILL — ZOLPIDEM TARTRATE 10 MG TAB: 10 | 30 days supply | Qty: 30 | Fill #0

## 2019-07-15 MED FILL — FLUCONAZOLE 150 MG TABS: 150 | 3 days supply | Qty: 2 | Fill #0

## 2019-07-15 MED FILL — ALBUTEROL SULFATE HFA 108 (: 108 (90 BAS | 25 days supply | Qty: 18 | Fill #0

## 2019-07-15 MED FILL — FLUTICASONE PROP 50 MCG SPR: 50 | 30 days supply | Qty: 16 | Fill #0

## 2019-07-15 MED FILL — ALPRAZolam 0.5 MG TABS: 0.5 | 20 days supply | Qty: 60 | Fill #0

## 2019-07-15 MED FILL — TERCONAZOLE 0.4% VAG CREAM: 0.4 | 7 days supply | Qty: 45 | Fill #0

## 2019-07-15 MED FILL — CETIRIZINE HCL 10 MG TABS: 10 | 100 days supply | Qty: 100 | Fill #0

## 2019-07-15 NOTE — Progress Notes (Signed)
   Subjective:    Patient ID: Katelyn Reyes, female    DOB: 19-Jan-1961, 59 y.o.   MRN: [id number]  HPI CPE- UTD on pap, mammo, colonoscopy, immunizations.  Getting 2nd COVID vaccine today.   Review of Systems Patient reports no vision/ hearing changes, adenopathy,fever, weight change,  persistant/recurrent hoarseness , swallowing issues, chest pain, palpitations, edema, persistant/recurrent cough, hemoptysis, dyspnea (rest/exertional/paroxysmal nocturnal), gastrointestinal bleeding (melena, rectal bleeding), abdominal pain, significant heartburn, bowel changes, GU symptoms (dysuria, hematuria, incontinence), Gyn symptoms (abnormal  bleeding, pain),  syncope, focal weakness, memory loss, numbness & tingling, skin/hair/nail changes, abnormal bruising or bleeding, anxiety, or depression.   This visit occurred during the SARS-CoV-2 public health emergency.  Safety protocols were in place, including screening questions prior to the visit, additional usage of staff PPE, and extensive cleaning of exam room while observing appropriate contact time as indicated for disinfecting solutions.       Objective:   Physical Exam General Appearance:    Alert, cooperative, no distress, appears stated age  Head:    Normocephalic, without obvious abnormality, atraumatic  Eyes:    PERRL, conjunctiva/corneas clear, EOM's intact, fundi    benign, both eyes  Ears:    Normal TM's and external ear canals, both ears  Nose:   Deferred due to COVID  Throat:   Neck:   Supple, symmetrical, trachea midline, no adenopathy;    Thyroid: no enlargement/tenderness/nodules  Back:     Symmetric, no curvature, ROM normal, no CVA tenderness  Lungs:     Clear to auscultation bilaterally, respirations unlabored  Chest Wall:    No tenderness or deformity   Heart:    Regular rate and rhythm, S1 and S2 normal, no murmur, rub   or gallop  Breast Exam:    Deferred to GYN  Abdomen:     Soft, non-tender, bowel sounds active all four  quadrants,    no masses, no organomegaly  Genitalia:    Deferred to GYN  Rectal:    Extremities:   Extremities normal, atraumatic, no cyanosis or edema  Pulses:   2+ and symmetric all extremities  Skin:   Skin color, texture, turgor normal, no rashes or lesions  Lymph nodes:   Cervical, supraclavicular, and axillary nodes normal  Neurologic:   CNII-XII intact, normal strength, sensation and reflexes    throughout          Assessment & Plan:

## 2019-07-15 NOTE — Patient Instructions (Signed)
Follow up in 1 year or as needed We'll notify you of your lab results and make any changes if needed Continue to work on healthy diet and regular exercise- you can do it! Call with any questions or concerns Stay Safe!  Stay Healthy! 

## 2019-07-15 NOTE — Assessment & Plan Note (Signed)
Pt's PE WNL w/ exception of obesity.  UTD on GYN, colonoscopy, immunizations.  Check labs.  Anticipatory guidance provided.  

## 2019-07-15 NOTE — Assessment & Plan Note (Signed)
Check labs and replete prn. 

## 2019-07-15 NOTE — Assessment & Plan Note (Signed)
Weight is stable.  Encouraged healthy diet and regular exercise.  Check labs to risk stratify.  Will follow.

## 2019-08-12 ENCOUNTER — Other Ambulatory Visit: Payer: Self-pay

## 2019-08-12 ENCOUNTER — Ambulatory Visit (INDEPENDENT_AMBULATORY_CARE_PROVIDER_SITE_OTHER): Payer: 59 | Admitting: Family Medicine

## 2019-08-12 ENCOUNTER — Encounter: Payer: Self-pay | Admitting: Family Medicine

## 2019-08-12 VITALS — BP 118/78 | HR 53 | Temp 97.9°F | Resp 16 | Wt 206.2 lb

## 2019-08-12 DIAGNOSIS — M545 Low back pain, unspecified: Secondary | ICD-10-CM

## 2019-08-12 MED ORDER — CYCLOBENZAPRINE HCL 10 MG PO TABS: 10.0000 mg | ORAL_TABLET | Freq: Three times a day (TID) | ORAL | 0 refills | Status: DC | PRN

## 2019-08-12 MED ORDER — PREDNISONE 10 MG PO TABS: ORAL_TABLET | ORAL | 0 refills | Status: DC

## 2019-08-12 MED FILL — predniSONE 10 MG TABS: 10 | 9 days supply | Qty: 18 | Fill #0

## 2019-08-12 MED FILL — CYCLOBENZAPRINE HCL 10 MG T: 10 | 10 days supply | Qty: 30 | Fill #0

## 2019-08-12 NOTE — Progress Notes (Signed)
   Subjective:    Patient ID: Katelyn Reyes, female    DOB: Jul 24, 1960, 59 y.o.   MRN: [id number]  HPI Fall- pt took a misstep yesterday on the 2nd stair, thinking she was at the bottom, and fell, hitting her back.  L lower back took the impact.  Was able to get up but 'it took my breath away it hurt so bad'.  Pain is localized.  No radiation.  Severe pain w/ rotation.  Difficulty w/ extension and flexion.  No numbness/tingling, no bowel or bladder incontinence.  Mild improvement w/ tylenol yesterday but not today, no improvement w/ motrin or Zanaflex.   Review of Systems For ROS see HPI   This visit occurred during the SARS-CoV-2 public health emergency.  Safety protocols were in place, including screening questions prior to the visit, additional usage of staff PPE, and extensive cleaning of exam room while observing appropriate contact time as indicated for disinfecting solutions.       Objective:   Physical Exam Vitals reviewed.  Constitutional:      General: She is not in acute distress.    Appearance: She is obese. She is not ill-appearing.     Comments: Obviously uncomfortable  Cardiovascular:     Pulses: Normal pulses.  Musculoskeletal:     Comments: Limited back flexion/extension/rotation  Skin:    General: Skin is warm and dry.     Findings: Bruising (faint bruising along L back between iliac crest and ribs) present.  Neurological:     General: No focal deficit present.     Mental Status: She is alert and oriented to person, place, and time.     Sensory: No sensory deficit.     Motor: No weakness.     Coordination: Coordination normal.     Deep Tendon Reflexes: Reflexes normal.           Assessment & Plan:  L sided back pain- new.  Pt sustained fall from standing height yesterday but hit her back on bottom step.  No apparent fx or bony injury but obvious soft tissue injury w/ bruising.  Start Prednisone taper, flexeril prn.  Reviewed supportive care and red  flags that should prompt return.  Pt expressed understanding and is in agreement w/ plan.

## 2019-08-12 NOTE — Patient Instructions (Signed)
Follow up as needed or as scheduled START the Prednisone taper as directed.  Take w/ food Use the Flexeril as needed- may cause drowsiness Alternate heat/ice Do some gentle stretching to prevent additional stiffness Call with any questions or concerns Hang in there!!

## 2019-09-14 MED FILL — MONTELUKAST SOD 10 MG TAB: 10 | 90 days supply | Qty: 90 | Fill #1

## 2019-09-14 MED FILL — PANTOPRAZOLE SOD DR 40 MG T: 40 | 46 days supply | Qty: 60 | Fill #0

## 2019-09-14 MED FILL — ESTRADIOL 1 MG TABS: 1 | 90 days supply | Qty: 90 | Fill #1

## 2019-09-14 MED FILL — PROGESTERONE MICRONIZED 200: 200 | 90 days supply | Qty: 90 | Fill #2

## 2019-09-28 ENCOUNTER — Other Ambulatory Visit: Payer: Self-pay | Admitting: Family Medicine

## 2019-09-28 DIAGNOSIS — Z1231 Encounter for screening mammogram for malignant neoplasm of breast: Secondary | ICD-10-CM

## 2019-11-09 ENCOUNTER — Other Ambulatory Visit: Payer: Self-pay

## 2019-11-09 ENCOUNTER — Encounter: Payer: Self-pay | Admitting: Physician Assistant

## 2019-11-09 ENCOUNTER — Telehealth (INDEPENDENT_AMBULATORY_CARE_PROVIDER_SITE_OTHER): Payer: 59 | Admitting: Physician Assistant

## 2019-11-09 DIAGNOSIS — H6981 Other specified disorders of Eustachian tube, right ear: Secondary | ICD-10-CM

## 2019-11-09 MED ORDER — PREDNISONE 10 MG PO TABS: ORAL_TABLET | ORAL | 0 refills | Status: AC

## 2019-11-09 MED FILL — FLUTICASONE PROP 50 MCG SPR: 50 | 30 days supply | Qty: 16 | Fill #1

## 2019-11-09 MED FILL — predniSONE 10 MG TABS: 10 | 8 days supply | Qty: 20 | Fill #0

## 2019-11-09 NOTE — Progress Notes (Signed)
I have discussed the procedure for the virtual visit with the patient who has given consent to proceed with assessment and treatment.   Satonya Lux S Laterrance Nauta, CMA     

## 2019-11-09 NOTE — Progress Notes (Signed)
Virtual Visit via Video   I connected with patient on 11/09/19 at  4:00 PM EDT by a video enabled telemedicine application and verified that I am speaking with the correct person using two identifiers.  Location patient: Home Location provider: [facility], [facility] Persons participating in the virtual visit: Patient, Provider, [place] (Patina Moore)  I discussed the limitations of evaluation and management by telemedicine and the availability of in person appointments. The patient expressed understanding and agreed to proceed.  Subjective:   HPI:   Patient presents via [facility] today c/o 1.5 weeks of R ear fullness/pressure. Notes it feels like it want to pop. Is associated with nasal congestion, some muffled hearing. Denies fever, chills, sinus pain, ear pain, facial pain. Denies chest congestion or cough. Has been taking Sudafed in addition to her Zyrtec and Flonase.   ROS:   See pertinent positives and negatives per HPI.  Patient Active Problem List   Diagnosis Date Noted  . Obesity (BMI 30-39.9) 07/12/2018  . Cervical spondylosis with radiculopathy 11/12/2017  . Vitamin D deficiency 07/07/2017  . Urinary incontinence in female 12/22/2013  . Asthma, mild intermittent 03/22/2013  . Vaginal itching 06/01/2012  . Thyromegaly 03/18/2012  . General medical examination 03/14/2011  . CHICKENPOX, HX OF 02/15/2010  . Hyperlipidemia 02/14/2009  . DEPRESSION 02/14/2009  . ALLERGIC RHINITIS 02/14/2009    Social History   Tobacco Use  . Smoking status: Never Smoker  . Smokeless tobacco: Never Used  Substance Use Topics  . Alcohol use: No    Comment: social    Current Outpatient Medications:  .  albuterol (VENTOLIN HFA) 108 (90 Base) MCG/ACT inhaler, INHALE 2 PUFFS INTO THE LUNGS EVERY 6 HOURS AS NEEDED FOR WHEEZING., Disp: 18 g, Rfl: 6 .  ALPRAZolam (XANAX) 0.5 MG tablet, Take 1 tablet (0.5 mg total) by mouth 3 (three) times daily as needed., Disp: 60 tablet, Rfl: 3 .   cetirizine (ZYRTEC) 10 MG tablet, TAKE 1 TABLET (10 MG TOTAL) BY MOUTH DAILY., Disp: 90 tablet, Rfl: 1 .  Cholecalciferol 50 MCG (2000 UT) CAPS, Vitamin D3 50 mcg (2,000 unit) capsule  Take by oral route., Disp: , Rfl:  .  cyclobenzaprine (FLEXERIL) 10 MG tablet, Take 1 tablet (10 mg total) by mouth 3 (three) times daily as needed for muscle spasms., Disp: 30 tablet, Rfl: 0 .  estradiol (ESTRACE) 1 MG tablet, Take 1 mg by mouth daily. , Disp: , Rfl: 0 .  fluticasone (FLONASE) 50 MCG/ACT nasal spray, Place 2 sprays into both nostrils daily., Disp: 16 g, Rfl: 6 .  levonorgestrel (MIRENA) 20 MCG/24HR IUD, 1 each by Intrauterine route once.  , Disp: , Rfl:  .  montelukast (SINGULAIR) 10 MG tablet, TAKE 1 TABLET BY MOUTH DAILY., Disp: 90 tablet, Rfl: 1 .  pantoprazole (PROTONIX) 40 MG tablet, Take one pill twice a day for 2 weeks then one/day after this, Disp: 60 tablet, Rfl: 3 .  progesterone (PROMETRIUM) 200 MG capsule, , Disp: , Rfl:  .  sertraline (ZOLOFT) 100 MG tablet, Take 1 tablet (100 mg total) by mouth daily., Disp: 90 tablet, Rfl: 1 .  vitamin B-12 (CYANOCOBALAMIN) 1000 MCG tablet, Take 1,000 mcg by mouth daily., Disp: , Rfl:  .  zolpidem (AMBIEN) 10 MG tablet, Take 1 tablet (10 mg total) by mouth at bedtime as needed. for sleep, Disp: 30 tablet, Rfl: 3  Allergies  Allergen Reactions  . Amoxicillin Rash and Other (See Comments)    Has patient had a PCN  reaction causing immediate rash, facial/tongue/throat swelling, SOB or lightheadedness with hypotension: No Has patient had a PCN reaction causing severe rash involving mucus membranes or skin necrosis: No Has patient had a PCN reaction that required treatment: #  #  #  YES  #  #  # - MD office Has patient had a PCN reaction occurring within the last 10 years: Yes If all of the above answers are "NO", then may proceed with Cephalosporin use.   . Erythromycin Nausea Only  . Hydrocodone-Acetaminophen Other (See Comments)    Hallucinations    . Shrimp [Shellfish Allergy] Itching and Other (See Comments)    REACTS TO SCALLOPS    Objective:   There were no vitals taken for this visit.  Patient is well-developed, well-nourished in no acute distress.  Resting comfortably at home.  Head is normocephalic, atraumatic.  No labored breathing.  Speech is clear and coherent with logical content.  Patient is alert and oriented at baseline.   Assessment and Plan:   1. Eustachian tube dysfunction, right Seems significant since not resolving with her allergy regimen and decongestant. Will have her continue nasal steroid. Switch to Claritin D instead of sudafed. Rx small prednisone taper to help open tubes. Discussed with patient that this can take a few weeks to fully return to normal. May need ENT assessment if not resolving.  - predniSONE (DELTASONE) 10 MG tablet; Take 4 tablets (40 mg total) by mouth daily with breakfast for 2 days, THEN 3 tablets (30 mg total) daily with breakfast for 2 days, THEN 2 tablets (20 mg total) daily with breakfast for 2 days, THEN 1 tablet (10 mg total) daily with breakfast for 2 days.  Dispense: 20 tablet; Refill: 0    Leeanne Rio, [place] 11/09/2019

## 2019-11-21 ENCOUNTER — Telehealth (INDEPENDENT_AMBULATORY_CARE_PROVIDER_SITE_OTHER): Payer: 59 | Admitting: Physician Assistant

## 2019-11-21 ENCOUNTER — Encounter: Payer: Self-pay | Admitting: Physician Assistant

## 2019-11-21 ENCOUNTER — Other Ambulatory Visit: Payer: Self-pay

## 2019-11-21 DIAGNOSIS — H6983 Other specified disorders of Eustachian tube, bilateral: Secondary | ICD-10-CM

## 2019-11-21 DIAGNOSIS — H66001 Acute suppurative otitis media without spontaneous rupture of ear drum, right ear: Secondary | ICD-10-CM | POA: Diagnosis not present

## 2019-11-21 MED ORDER — AZITHROMYCIN 250 MG PO TABS: ORAL_TABLET | ORAL | 0 refills | Status: DC

## 2019-11-21 MED ORDER — FLUCONAZOLE 150 MG PO TABS: 150.0000 mg | ORAL_TABLET | Freq: Once | ORAL | 0 refills | Status: AC

## 2019-11-21 MED ORDER — AZELASTINE HCL 0.1 % NA SOLN: 1.0000 | Freq: Two times a day (BID) | NASAL | 0 refills | Status: DC

## 2019-11-21 NOTE — Progress Notes (Signed)
Virtual Visit via Video   I connected with patient on 11/21/19 at  4:00 PM EDT by a video enabled telemedicine application and verified that I am speaking with the correct person using two identifiers.  Location patient: Home Location provider: [facility] Persons participating in the virtual visit: Patient, Provider, [place] (Patina Moore)  I discussed the limitations of evaluation and management by telemedicine and the availability of in person appointments. The patient expressed understanding and agreed to proceed.  Subjective:   HPI:   Patient presents via [place] today continued ear pressure and popping of R ear, now with some milder pressure of the L ear, despite treatment with antihistamine, decongestant and saline rinses. Is also s/p prednisone taper for ETD. Notes now with some R ear pain. Denies fever, chills, nausea. Denies other URI symptoms. .  ROS:   See pertinent positives and negatives per HPI.  Patient Active Problem List   Diagnosis Date Noted  . Obesity (BMI 30-39.9) 07/12/2018  . Cervical spondylosis with radiculopathy 11/12/2017  . Vitamin D deficiency 07/07/2017  . Urinary incontinence in female 12/22/2013  . Asthma, mild intermittent 03/22/2013  . Vaginal itching 06/01/2012  . Thyromegaly 03/18/2012  . General medical examination 03/14/2011  . CHICKENPOX, HX OF 02/15/2010  . Hyperlipidemia 02/14/2009  . DEPRESSION 02/14/2009  . ALLERGIC RHINITIS 02/14/2009    Social History   Tobacco Use  . Smoking status: Never Smoker  . Smokeless tobacco: Never Used  Substance Use Topics  . Alcohol use: No    Comment: social    Current Outpatient Medications:  .  albuterol (VENTOLIN HFA) 108 (90 Base) MCG/ACT inhaler, INHALE 2 PUFFS INTO THE LUNGS EVERY 6 HOURS AS NEEDED FOR WHEEZING., Disp: 18 g, Rfl: 6 .  ALPRAZolam (XANAX) 0.5 MG tablet, Take 1 tablet (0.5 mg total) by mouth 3 (three) times daily as needed., Disp: 60 tablet, Rfl: 3 .   cetirizine (ZYRTEC) 10 MG tablet, TAKE 1 TABLET (10 MG TOTAL) BY MOUTH DAILY., Disp: 90 tablet, Rfl: 1 .  Cholecalciferol 50 MCG (2000 UT) CAPS, Vitamin D3 50 mcg (2,000 unit) capsule  Take by oral route., Disp: , Rfl:  .  cyclobenzaprine (FLEXERIL) 10 MG tablet, Take 1 tablet (10 mg total) by mouth 3 (three) times daily as needed for muscle spasms., Disp: 30 tablet, Rfl: 0 .  estradiol (ESTRACE) 1 MG tablet, Take 1 mg by mouth daily. , Disp: , Rfl: 0 .  fluticasone (FLONASE) 50 MCG/ACT nasal spray, Place 2 sprays into both nostrils daily., Disp: 16 g, Rfl: 6 .  levonorgestrel (MIRENA) 20 MCG/24HR IUD, 1 each by Intrauterine route once.  , Disp: , Rfl:  .  montelukast (SINGULAIR) 10 MG tablet, TAKE 1 TABLET BY MOUTH DAILY., Disp: 90 tablet, Rfl: 1 .  pantoprazole (PROTONIX) 40 MG tablet, Take one pill twice a day for 2 weeks then one/day after this, Disp: 60 tablet, Rfl: 3 .  progesterone (PROMETRIUM) 200 MG capsule, , Disp: , Rfl:  .  sertraline (ZOLOFT) 100 MG tablet, Take 1 tablet (100 mg total) by mouth daily., Disp: 90 tablet, Rfl: 1 .  vitamin B-12 (CYANOCOBALAMIN) 1000 MCG tablet, Take 1,000 mcg by mouth daily., Disp: , Rfl:  .  zolpidem (AMBIEN) 10 MG tablet, Take 1 tablet (10 mg total) by mouth at bedtime as needed. for sleep, Disp: 30 tablet, Rfl: 3  Allergies  Allergen Reactions  . Amoxicillin Rash and Other (See Comments)    Has patient had a PCN reaction  causing immediate rash, facial/tongue/throat swelling, SOB or lightheadedness with hypotension: No Has patient had a PCN reaction causing severe rash involving mucus membranes or skin necrosis: No Has patient had a PCN reaction that required treatment: #  #  #  YES  #  #  # - MD office Has patient had a PCN reaction occurring within the last 10 years: Yes If all of the above answers are "NO", then may proceed with Cephalosporin use.   . Erythromycin Nausea Only  . Hydrocodone-Acetaminophen Other (See Comments)    Hallucinations    . Shrimp [Shellfish Allergy] Itching and Other (See Comments)    REACTS TO SCALLOPS    Objective:   There were no vitals taken for this visit.  Patient is well-developed, well-nourished in no acute distress.  Resting comfortably at home.  Head is normocephalic, atraumatic.  No labored breathing.  Speech is clear and coherent with logical content.  Patient is alert and oriented at baseline.   Assessment and Plan:   1. Non-recurrent acute suppurative otitis media of right ear without spontaneous rupture of tympanic membrane Penicillin allergic. Will start Azithromycin as directed. Supportive measures and OTC medications reviewed.  - azithromycin (ZITHROMAX) 250 MG tablet; Take 2 tablets on Day 1. Then take 1 tablet daily.  Dispense: 6 tablet; Refill: 0  2. Eustachian tube dysfunction, bilateral Continued despite antihistamine, decongestant, nasal steroids, saline rinse and prednisone taper. Will have her continue antihistamine and decongestant. Will add on astelin spray. Referral to ENT for further evaluation and management placed.  - Ambulatory referral to ENT - azelastine (ASTELIN) 0.1 % nasal spray; Place 1 spray into both nostrils 2 (two) times daily. Use in each nostril as directed  Dispense: 30 mL; Refill: 0 .   Leeanne Rio, PA-C 11/21/2019

## 2019-12-09 ENCOUNTER — Other Ambulatory Visit: Payer: Self-pay

## 2019-12-09 ENCOUNTER — Encounter: Payer: Self-pay | Admitting: Family Medicine

## 2019-12-09 ENCOUNTER — Ambulatory Visit: Payer: 59 | Admitting: Family Medicine

## 2019-12-09 VITALS — BP 110/70 | HR 81 | Temp 97.0°F | Resp 12 | Ht 63.0 in | Wt 207.1 lb

## 2019-12-09 DIAGNOSIS — H6981 Other specified disorders of Eustachian tube, right ear: Secondary | ICD-10-CM

## 2019-12-09 DIAGNOSIS — H9201 Otalgia, right ear: Secondary | ICD-10-CM

## 2019-12-09 MED FILL — ZOLPIDEM TARTRATE 10 MG TAB: 10 | 30 days supply | Qty: 30 | Fill #1

## 2019-12-09 MED FILL — PANTOPRAZOLE SOD DR 40 MG T: 40 | 60 days supply | Qty: 60 | Fill #1

## 2019-12-09 MED FILL — ALPRAZolam 0.5 MG TABS: 0.5 | 20 days supply | Qty: 60 | Fill #1

## 2019-12-09 NOTE — Patient Instructions (Signed)
A few things to remember from today's visit:   It seems like you already tried all non surgical treatments that are recommended for this problem. We could stop Cetirizine. Pop ears every 1-2 hours (try). Plain mucinex may help. Continue nasal steroids. Keep appointment with ENT.  Eustachian Tube Dysfunction  Eustachian tube dysfunction refers to a condition in which a blockage develops in the narrow passage that connects the middle ear to the back of the nose (eustachian tube). The eustachian tube regulates air pressure in the middle ear by letting air move between the ear and nose. It also helps to drain fluid from the middle ear space. Eustachian tube dysfunction can affect one or both ears. When the eustachian tube does not function properly, air pressure, fluid, or both can build up in the middle ear. What are the causes? This condition occurs when the eustachian tube becomes blocked or cannot open normally. Common causes of this condition include:  Ear infections.  Colds and other infections that affect the nose, mouth, and throat (upper respiratory tract).  Allergies.  Irritation from cigarette smoke.  Irritation from stomach acid coming up into the esophagus (gastroesophageal reflux). The esophagus is the tube that carries food from the mouth to the stomach.  Sudden changes in air pressure, such as from descending in an airplane or scuba diving.  Abnormal growths in the nose or throat, such as: ? Growths that line the nose (nasal polyps). ? Abnormal growth of cells (tumors). ? Enlarged tissue at the back of the throat (adenoids). What increases the risk? You are more likely to develop this condition if:  You smoke.  You are overweight.  You are a child who has: ? Certain birth defects of the mouth, such as cleft palate. ? Large tonsils or adenoids. What are the signs or symptoms? Common symptoms of this condition include:  A feeling of fullness in the ear.  Ear  pain.  Clicking or popping noises in the ear.  Ringing in the ear.  Hearing loss.  Loss of balance.  Dizziness. Symptoms may get worse when the air pressure around you changes, such as when you travel to an area of high elevation, fly on an airplane, or go scuba diving. How is this diagnosed? This condition may be diagnosed based on:  Your symptoms.  A physical exam of your ears, nose, and throat.  Tests, such as those that measure: ? The movement of your eardrum (tympanogram). ? Your hearing (audiometry). How is this treated? Treatment depends on the cause and severity of your condition.  In mild cases, you may relieve your symptoms by moving air into your ears. This is called "popping the ears."  In more severe cases, or if you have symptoms of fluid in your ears, treatment may include: ? Medicines to relieve congestion (decongestants). ? Medicines that treat allergies (antihistamines). ? Nasal sprays or ear drops that contain medicines that reduce swelling (steroids). ? A procedure to drain the fluid in your eardrum (myringotomy). In this procedure, a small tube is placed in the eardrum to:  Drain the fluid.  Restore the air in the middle ear space. ? A procedure to insert a balloon device through the nose to inflate the opening of the eustachian tube (balloon dilation). Follow these instructions at home: Lifestyle  Do not do any of the following until your health care provider approves: ? Travel to high altitudes. ? Fly in airplanes. ? Work in a Pension scheme manager or room. ? Scuba dive.  Do not use any products that contain nicotine or tobacco, such as cigarettes and e-cigarettes. If you need help quitting, ask your health care provider.  Keep your ears dry. Wear fitted earplugs during showering and bathing. Dry your ears completely after. General instructions  Take over-the-counter and prescription medicines only as told by your health care provider.  Use  techniques to help pop your ears as recommended by your health care provider. These may include: ? Chewing gum. ? Yawning. ? Frequent, forceful swallowing. ? Closing your mouth, holding your nose closed, and gently blowing as if you are trying to blow air out of your nose.  Keep all follow-up visits as told by your health care provider. This is important. Contact a health care provider if:  Your symptoms do not go away after treatment.  Your symptoms come back after treatment.  You are unable to pop your ears.  You have: ? A fever. ? Pain in your ear. ? Pain in your head or neck. ? Fluid draining from your ear.  Your hearing suddenly changes.  You become very dizzy.  You lose your balance. Summary  Eustachian tube dysfunction refers to a condition in which a blockage develops in the eustachian tube.  It can be caused by ear infections, allergies, inhaled irritants, or abnormal growths in the nose or throat.  Symptoms include ear pain, hearing loss, or ringing in the ears.  Mild cases are treated with maneuvers to unblock the ears, such as yawning or ear popping.  Severe cases are treated with medicines. Surgery may also be done (rare). This information is not intended to replace advice given to you by your health care provider. Make sure you discuss any questions you have with your health care provider. Document Revised: 09/22/2017 Document Reviewed: 09/22/2017 Elsevier Patient Education  [employer].  If you need refills please call your pharmacy. Do not use My Chart to request refills or for acute issues that need immediate attention.    Please be sure medication list is accurate. If a new problem present, please set up appointment sooner than planned today.

## 2019-12-09 NOTE — Progress Notes (Signed)
ACUTE VISIT Chief Complaint  Patient presents with  . Ear Pain    bilateral ear pain and pressure that started April 20th, right ear worse   HPI: [name] is a 59 y.o. female with history of asthma, allergic rhinitis, anxiety, and depression here today with above complaint. She has been having persistent bilateral ear discomfort, right one is worse. "Muffled" sensation. Problem is constant. She has not identified exacerbating or alleviating factors.  There is not associated ear drainage or earache. No recent travel or URI.  Seen for same problem on 11/09/2019 and 11/21/2019. She completed course of prednisone on 11/09/2019 for eustachian tube dysfunction. Antibiotic treatment on 11/21/2019  She has tried OTC Sudafed,nasal Afrin, and Nettie pot. These helps some with left ear but does not help with right ear symptoms.  Nasal congestion has improved. Sine problem started she can not taste certain foods Negative for fever, chills, anosmia, sore throat, acid reflux, CP, cough, wheezing, or dyspnea.  She feels like problem is getting worse. Currently she is on Astelin nasal spray, Flonase nasal spray, Singulair 10 mg daily, and cetirizine 10 mg daily.  Review of Systems  Constitutional: Negative for activity change and appetite change.  HENT: Negative for facial swelling, mouth sores, tinnitus and trouble swallowing.   Eyes: Negative for discharge and itching.  Gastrointestinal: Negative for nausea and vomiting.  Allergic/Immunologic: Positive for environmental allergies.  Neurological: Negative for dizziness and headaches.  Rest see pertinent positives and negatives per HPI.  Current Outpatient Medications on File Prior to Visit  Medication Sig Dispense Refill  . albuterol (VENTOLIN HFA) 108 (90 Base) MCG/ACT inhaler INHALE 2 PUFFS INTO THE LUNGS EVERY 6 HOURS AS NEEDED FOR WHEEZING. 18 g 6  . ALPRAZolam (XANAX) 0.5 MG tablet Take 1 tablet (0.5 mg total) by mouth 3  (three) times daily as needed. 60 tablet 3  . azelastine (ASTELIN) 0.1 % nasal spray Place 1 spray into both nostrils 2 (two) times daily. Use in each nostril as directed 30 mL 0  . azithromycin (ZITHROMAX) 250 MG tablet Take 2 tablets on Day 1. Then take 1 tablet daily. 6 tablet 0  . cetirizine (ZYRTEC) 10 MG tablet TAKE 1 TABLET (10 MG TOTAL) BY MOUTH DAILY. 90 tablet 1  . Cholecalciferol 50 MCG (2000 UT) CAPS Vitamin D3 50 mcg (2,000 unit) capsule  Take by oral route.    . cyclobenzaprine (FLEXERIL) 10 MG tablet Take 1 tablet (10 mg total) by mouth 3 (three) times daily as needed for muscle spasms. 30 tablet 0  . estradiol (ESTRACE) 1 MG tablet Take 1 mg by mouth daily.   0  . fluticasone (FLONASE) 50 MCG/ACT nasal spray Place 2 sprays into both nostrils daily. 16 g 6  . levonorgestrel (MIRENA) 20 MCG/24HR IUD 1 each by Intrauterine route once.      . montelukast (SINGULAIR) 10 MG tablet TAKE 1 TABLET BY MOUTH DAILY. 90 tablet 1  . pantoprazole (PROTONIX) 40 MG tablet Take one pill twice a day for 2 weeks then one/day after this 60 tablet 3  . progesterone (PROMETRIUM) 200 MG capsule     . sertraline (ZOLOFT) 100 MG tablet Take 1 tablet (100 mg total) by mouth daily. 90 tablet 1  . vitamin B-12 (CYANOCOBALAMIN) 1000 MCG tablet Take 1,000 mcg by mouth daily.    Marland Kitchen zolpidem (AMBIEN) 10 MG tablet Take 1 tablet (10 mg total) by mouth at bedtime as needed. for sleep 30 tablet 3  No current facility-administered medications on file prior to visit.   Past Medical History:  Diagnosis Date  . Allergic rhinitis   . Anxiety   . Asthma    related to seasonal allergies  . Breast calcification, right 02/02/2013   Surgically excised 02/17/13 > path benign   . Depression   . GERD (gastroesophageal reflux disease)   . Hyperlipidemia   . IUD 02/07/10   Allergies  Allergen Reactions  . Amoxicillin Rash and Other (See Comments)    Has patient had a PCN reaction causing immediate rash,  facial/tongue/throat swelling, SOB or lightheadedness with hypotension: No Has patient had a PCN reaction causing severe rash involving mucus membranes or skin necrosis: No Has patient had a PCN reaction that required treatment: #  #  #  YES  #  #  # - MD office Has patient had a PCN reaction occurring within the last 10 years: Yes If all of the above answers are "NO", then may proceed with Cephalosporin use.   . Erythromycin Nausea Only  . Hydrocodone-Acetaminophen Other (See Comments)    Hallucinations  . Shrimp [Shellfish Allergy] Itching and Other (See Comments)    REACTS TO SCALLOPS    Social History   Socioeconomic History  . Marital status: Single    Spouse name: Not on file  . Number of children: Not on file  . Years of education: Not on file  . Highest education level: Not on file  Occupational History  . Not on file  Tobacco Use  . Smoking status: Never Smoker  . Smokeless tobacco: Never Used  Vaping Use  . Vaping Use: Never used  Substance and Sexual Activity  . Alcohol use: No    Comment: social  . Drug use: No  . Sexual activity: Yes    Birth control/protection: I.U.D.  Other Topics Concern  . Not on file  Social History Narrative  . Not on file   Social Determinants of Health   Financial Resource Strain:   . Difficulty of Paying Living Expenses:   Food Insecurity:   . Worried About Charity fundraiser in the Last Year:   . Arboriculturist in the Last Year:   Transportation Needs:   . Film/video editor (Medical):   Marland Kitchen Lack of Transportation (Non-Medical):   Physical Activity:   . Days of Exercise per Week:   . Minutes of Exercise per Session:   Stress:   . Feeling of Stress :   Social Connections:   . Frequency of Communication with Friends and Family:   . Frequency of Social Gatherings with Friends and Family:   . Attends Religious Services:   . Active Member of Clubs or Organizations:   . Attends Archivist Meetings:   Marland Kitchen Marital  Status:     Vitals:   12/09/19 1558  BP: 110/70  Pulse: 81  Resp: 12  Temp: (!) 97 F (36.1 C)  SpO2: 97%   Body mass index is 36.69 kg/m.  Physical Exam Vitals and nursing note reviewed.  Constitutional:      General: She is not in acute distress.    Appearance: Normal appearance. She is well-developed. She is not ill-appearing.  HENT:     Head: Normocephalic and atraumatic.     Right Ear: Tympanic membrane, ear canal and external ear normal.     Left Ear: External ear normal. Tympanic membrane is not erythematous.     Ears:     Comments:  Left ear canal with excess cerumen , able to see TN partially.    Nose: No congestion or rhinorrhea.     Right Sinus: No maxillary sinus tenderness or frontal sinus tenderness.     Left Sinus: No maxillary sinus tenderness or frontal sinus tenderness.     Comments: Normal sinus transilluminations, bilateral frontal and maxillary.    Mouth/Throat:     Mouth: Mucous membranes are moist.     Pharynx: Oropharynx is clear.  Eyes:     Conjunctiva/sclera: Conjunctivae normal.  Cardiovascular:     Rate and Rhythm: Normal rate and regular rhythm.  Pulmonary:     Effort: Pulmonary effort is normal. No respiratory distress.     Breath sounds: Normal breath sounds. No stridor.  Musculoskeletal:     Cervical back: No edema or erythema. No muscular tenderness.  Lymphadenopathy:     Head:     Right side of head: No submandibular adenopathy.     Left side of head: No submandibular adenopathy.     Cervical: No cervical adenopathy.  Skin:    General: Skin is warm.     Findings: No erythema or rash.  Neurological:     General: No focal deficit present.     Mental Status: She is alert and oriented to person, place, and time.     Gait: Gait normal.  Psychiatric:        Mood and Affect: Affect normal. Mood is anxious.     Comments: Well groomed, good eye contact.    ASSESSMENT AND PLAN:  Ms.Cruz was seen today for ear pain.  Diagnoses  and all orders for this visit:  Ear discomfort, right At this time she does not report otalgia but rather ear fullness sensation. We discussed possible causes. She did not want hearing screening here in the office. Instructed to keep ENT appt.  Dysfunction of right eustachian tube She has already tried oral steroids,abx,and OTC medications. We discussed dx and treatment options. She agrees with trying OTC plain Mucinex and stopping Cetirizine for now. Continue intranasal steroid and nasal saline irrigations. If in fact this problem is the main etiology for ear symptoms, she may benefit from myringotomy. Instructed about warning signs  Return if symptoms worsen or fail to improve.   Dauna Ziska G. [place], MD  [facility]. [place] office.     A few things to remember from today's visit:   It seems like you already tried all non surgical treatments that are recommended for this problem. We could stop Cetirizine. Pop ears every 1-2 hours (try). Plain mucinex may help. Continue nasal steroids. Keep appointment with ENT.  Eustachian Tube Dysfunction  Eustachian tube dysfunction refers to a condition in which a blockage develops in the narrow passage that connects the middle ear to the back of the nose (eustachian tube). The eustachian tube regulates air pressure in the middle ear by letting air move between the ear and nose. It also helps to drain fluid from the middle ear space. Eustachian tube dysfunction can affect one or both ears. When the eustachian tube does not function properly, air pressure, fluid, or both can build up in the middle ear. What are the causes? This condition occurs when the eustachian tube becomes blocked or cannot open normally. Common causes of this condition include:  Ear infections.  Colds and other infections that affect the nose, mouth, and throat (upper respiratory tract).  Allergies.  Irritation from cigarette smoke.  Irritation from  stomach acid coming up  into the esophagus (gastroesophageal reflux). The esophagus is the tube that carries food from the mouth to the stomach.  Sudden changes in air pressure, such as from descending in an airplane or scuba diving.  Abnormal growths in the nose or throat, such as: ? Growths that line the nose (nasal polyps). ? Abnormal growth of cells (tumors). ? Enlarged tissue at the back of the throat (adenoids). What increases the risk? You are more likely to develop this condition if:  You smoke.  You are overweight.  You are a child who has: ? Certain birth defects of the mouth, such as cleft palate. ? Large tonsils or adenoids. What are the signs or symptoms? Common symptoms of this condition include:  A feeling of fullness in the ear.  Ear pain.  Clicking or popping noises in the ear.  Ringing in the ear.  Hearing loss.  Loss of balance.  Dizziness. Symptoms may get worse when the air pressure around you changes, such as when you travel to an area of high elevation, fly on an airplane, or go scuba diving. How is this diagnosed? This condition may be diagnosed based on:  Your symptoms.  A physical exam of your ears, nose, and throat.  Tests, such as those that measure: ? The movement of your eardrum (tympanogram). ? Your hearing (audiometry). How is this treated? Treatment depends on the cause and severity of your condition.  In mild cases, you may relieve your symptoms by moving air into your ears. This is called "popping the ears."  In more severe cases, or if you have symptoms of fluid in your ears, treatment may include: ? Medicines to relieve congestion (decongestants). ? Medicines that treat allergies (antihistamines). ? Nasal sprays or ear drops that contain medicines that reduce swelling (steroids). ? A procedure to drain the fluid in your eardrum (myringotomy). In this procedure, a small tube is placed in the eardrum to:  Drain the  fluid.  Restore the air in the middle ear space. ? A procedure to insert a balloon device through the nose to inflate the opening of the eustachian tube (balloon dilation). Follow these instructions at home: Lifestyle  Do not do any of the following until your health care provider approves: ? Travel to high altitudes. ? Fly in airplanes. ? Work in a Pension scheme manager or room. ? Scuba dive.  Do not use any products that contain nicotine or tobacco, such as cigarettes and e-cigarettes. If you need help quitting, ask your health care provider.  Keep your ears dry. Wear fitted earplugs during showering and bathing. Dry your ears completely after. General instructions  Take over-the-counter and prescription medicines only as told by your health care provider.  Use techniques to help pop your ears as recommended by your health care provider. These may include: ? Chewing gum. ? Yawning. ? Frequent, forceful swallowing. ? Closing your mouth, holding your nose closed, and gently blowing as if you are trying to blow air out of your nose.  Keep all follow-up visits as told by your health care provider. This is important. Contact a health care provider if:  Your symptoms do not go away after treatment.  Your symptoms come back after treatment.  You are unable to pop your ears.  You have: ? A fever. ? Pain in your ear. ? Pain in your head or neck. ? Fluid draining from your ear.  Your hearing suddenly changes.  You become very dizzy.  You lose your balance. Summary  Eustachian tube dysfunction refers to a condition in which a blockage develops in the eustachian tube.  It can be caused by ear infections, allergies, inhaled irritants, or abnormal growths in the nose or throat.  Symptoms include ear pain, hearing loss, or ringing in the ears.  Mild cases are treated with maneuvers to unblock the ears, such as yawning or ear popping.  Severe cases are treated with medicines.  Surgery may also be done (rare). This information is not intended to replace advice given to you by your health care provider. Make sure you discuss any questions you have with your health care provider. Document Revised: 09/22/2017 Document Reviewed: 09/22/2017 Elsevier Patient Education  [employer].  If you need refills please call your pharmacy. Do not use My Chart to request refills or for acute issues that need immediate attention.    Please be sure medication list is accurate. If a new problem present, please set up appointment sooner than planned today.

## 2019-12-15 ENCOUNTER — Other Ambulatory Visit: Payer: Self-pay

## 2019-12-15 ENCOUNTER — Ambulatory Visit
Admission: RE | Admit: 2019-12-15 | Discharge: 2019-12-15 | Disposition: A | Discharge status: Home / Self Care | Payer: 59 | Source: Ambulatory Visit | Attending: Family Medicine | Admitting: Family Medicine

## 2019-12-15 DIAGNOSIS — Z1231 Encounter for screening mammogram for malignant neoplasm of breast: Secondary | ICD-10-CM

## 2019-12-26 ENCOUNTER — Ambulatory Visit: Payer: 59 | Admitting: Family Medicine

## 2019-12-26 ENCOUNTER — Other Ambulatory Visit: Payer: Self-pay

## 2019-12-26 ENCOUNTER — Encounter: Payer: Self-pay | Admitting: Family Medicine

## 2019-12-26 VITALS — BP 126/85 | HR 80 | Temp 98.0°F | Resp 16 | Wt 204.5 lb

## 2019-12-26 DIAGNOSIS — M25561 Pain in right knee: Secondary | ICD-10-CM | POA: Diagnosis not present

## 2019-12-26 MED ORDER — PREDNISONE 10 MG PO TABS: ORAL_TABLET | ORAL | 0 refills | Status: DC

## 2019-12-26 MED FILL — ESTRADIOL 1 MG TABS: 1 | 90 days supply | Qty: 90 | Fill #2

## 2019-12-26 MED FILL — SERTRALINE HCL 100 MG TAB: 100 | 90 days supply | Qty: 90 | Fill #1

## 2019-12-26 MED FILL — PROGESTERONE MICRONIZED 200: 200 | 90 days supply | Qty: 90 | Fill #3

## 2019-12-26 MED FILL — predniSONE 10 MG TABS: 10 | 9 days supply | Qty: 18 | Fill #0

## 2019-12-26 NOTE — Patient Instructions (Signed)
Follow up as needed or as scheduled START the Prednisone as directed- 3 tabs at the same time x3 days and then 2 tabs at the same time x3 days, and then 1 tab daily.  Take w/ food We'll call you with your Ortho appt ICE! Call with any questions or concerns Hang in there!

## 2019-12-26 NOTE — Progress Notes (Signed)
   Subjective:    Patient ID: Katelyn Reyes, female    DOB: 12/18/1960, 59 y.o.   MRN: [id number]  HPI Knee pain- R sided.  Pt has known arthritis.  'it's throbbing'.  Has taken Motrin, Meloxicam, used both ice and heat.  Knee is swollen and painful.  Worse w/ standing/walking/bending.  sxs have been bad x2 weeks.  No known injury.   Review of Systems For ROS see HPI   This visit occurred during the SARS-CoV-2 public health emergency.  Safety protocols were in place, including screening questions prior to the visit, additional usage of staff PPE, and extensive cleaning of exam room while observing appropriate contact time as indicated for disinfecting solutions.       Objective:   Physical Exam Vitals reviewed.  Constitutional:      Appearance: Normal appearance. She is obese.  Cardiovascular:     Pulses: Normal pulses.  Musculoskeletal:        General: Swelling (diffusely swollen R knee) and tenderness (TTP over R knee medial joint line) present.     Comments: Pain w/ full flexion and extension R knee is warm to touch  Skin:    General: Skin is warm and dry.     Findings: No erythema.  Neurological:     Mental Status: She is alert.           Assessment & Plan:  R knee pain- new.  Start prednisone taper to calm inflammatory response.  Pain at extremes of flexion and extension concerning for possible medial meniscus.  Refer to ortho for complete evaluation and tx.  Pt expressed understanding and is in agreement w/ plan.

## 2019-12-27 DIAGNOSIS — H9190 Unspecified hearing loss, unspecified ear: Secondary | ICD-10-CM | POA: Diagnosis not present

## 2019-12-27 DIAGNOSIS — H6983 Other specified disorders of Eustachian tube, bilateral: Secondary | ICD-10-CM | POA: Diagnosis not present

## 2019-12-27 DIAGNOSIS — J310 Chronic rhinitis: Secondary | ICD-10-CM | POA: Diagnosis not present

## 2019-12-27 DIAGNOSIS — H93293 Other abnormal auditory perceptions, bilateral: Secondary | ICD-10-CM | POA: Diagnosis not present

## 2019-12-27 DIAGNOSIS — J302 Other seasonal allergic rhinitis: Secondary | ICD-10-CM | POA: Diagnosis not present

## 2020-01-02 ENCOUNTER — Encounter: Payer: Self-pay | Admitting: Family Medicine

## 2020-01-02 ENCOUNTER — Ambulatory Visit (INDEPENDENT_AMBULATORY_CARE_PROVIDER_SITE_OTHER): Payer: 59 | Admitting: Family Medicine

## 2020-01-02 ENCOUNTER — Ambulatory Visit (INDEPENDENT_AMBULATORY_CARE_PROVIDER_SITE_OTHER): Payer: 59

## 2020-01-02 ENCOUNTER — Other Ambulatory Visit: Payer: Self-pay

## 2020-01-02 DIAGNOSIS — M25561 Pain in right knee: Secondary | ICD-10-CM

## 2020-01-02 NOTE — Progress Notes (Signed)
Currently on prednisone which is helping Feels like it is going to give out on her No known injury  Pain for a couple of weeks

## 2020-01-02 NOTE — Progress Notes (Signed)
Office Visit Note   Patient: Katelyn Reyes           Date of Birth: [birth year]           MRN: [id number] Visit Date: 01/02/2020 Requested by: Midge Minium, MD [address] [place],  [place] [address] PCP: Midge Minium, MD  Subjective: Chief Complaint  Patient presents with  . Right Knee - Pain    HPI: She is here with right knee pain.  Symptoms started couple weeks ago, no definite injury.  She tried ibuprofen and meloxicam, ice and heat, but nothing was helping.  She went to her PCP who gave her prednisone and that seems to be helping.  The knee still feels swollen and stiff, and it feels like is going to give way sometimes.  It pops intermittently.  She has not had any locking.  In the past she reports that she has had her knee aspirated and injected with improvement in symptoms.  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               ROS:   All other systems were reviewed and are negative.  Objective: Vital Signs: There were no vitals taken for this visit.  Physical Exam:  General:  Alert and oriented, in no acute distress. Pulm:  Breathing unlabored. Psy:  Normal mood, congruent affect. Skin: No erythema or rash, she does have increased warmth compared to left. Left knee: 1+ patellofemoral crepitus, negative patella compression test.  Extensor mechanism is intact.  No laxity with varus or valgus stress.  Mild medial and lateral joint line tenderness but no pain or click with McMurray's.  Lachman's feels solid.  Imaging: XR Knee 1-2 Views Right  Result Date: 01/02/2020 X-rays right knee reveal moderate patellofemoral and medial compartment DJD.  Mild in the lateral compartment.  No sign of loose body.   Assessment & Plan: 1.  Right knee DJD with effusion, cannot rule out loose body or meniscus tear. -We discussed options and she wants to try having the knee aspirated and injected today.  If symptoms recur  she will contact me and I will order MRI scan.  Hinged knee brace given for support.     Procedures: Right knee aspiration and injection: After sterile prep with Betadine, injected 5 cc 1% lidocaine without epinephrine, then aspirated 15 cc of clear yellow synovial fluid, then injected 40 mg methylprednisolone from superolateral approach.    PMFS History: Patient Active Problem List   Diagnosis Date Noted  . Obesity (BMI 30-39.9) 07/12/2018  . Cervical spondylosis with radiculopathy 11/12/2017  . Vitamin D deficiency 07/07/2017  . Urinary incontinence in female 12/22/2013  . Asthma, mild intermittent 03/22/2013  . Vaginal itching 06/01/2012  . Thyromegaly 03/18/2012  . General medical examination 03/14/2011  . CHICKENPOX, HX OF 02/15/2010  . Hyperlipidemia 02/14/2009  . DEPRESSION 02/14/2009  . ALLERGIC RHINITIS 02/14/2009   Past Medical History:  Diagnosis Date  . Allergic rhinitis   . Anxiety   . Asthma    related to seasonal allergies  . Breast calcification, right 02/02/2013   Surgically excised 02/17/13 > path benign   . Depression   . GERD (gastroesophageal reflux disease)   . Hyperlipidemia   . IUD 02/07/10    Family History  Problem Relation Age of Onset  . Hypertension Father   . Diabetes Father   . Fibrocystic breast disease Mother   . Stroke  Maternal Grandmother   . Colon cancer Paternal Grandmother   . Breast cancer Other        aunt  . Coronary artery disease Neg Hx     Past Surgical History:  Procedure Laterality Date  . BREAST BIOPSY Right 02/17/2013   Procedure:  NEEDLE LOCALIZATION REMOVAL RIGHT BREAST CALCIFICATIONS;  Surgeon: Haywood Lasso, MD;  Location: [facility];  Service: General;  Laterality: Right;  . BREAST EXCISIONAL BIOPSY Right pt unsure   benign  . BURCH PROCEDURE N/A 12/22/2013   Procedure: BURCH CYSTO URETHROPEXY, ANTERIOR AND POSTERIOR CULPORRHAPHY;  Surgeon: Ena Dawley, MD;  Location: [facility];  Service:  Gynecology;  Laterality: N/A;  . CERVICAL DISC ARTHROPLASTY N/A 11/12/2017   Procedure: ARTIFICIAL DISC REPLACEMENT CERVICAL SIX-SEVEN;  Surgeon: Ashok Pall, MD;  Location: [facility];  Service: Neurosurgery;  Laterality: N/A;  ARTIFICIAL DISC REPLACEMENT CERVICAL 6- CERVICAL 7  . CHOLECYSTECTOMY N/A 10/09/2017   Procedure: LAPAROSCOPIC CHOLECYSTECTOMY WITH INTRAOPERATIVE CHOLANGIOGRAM;  Surgeon: Judeth Horn, MD;  Location: [facility];  Service: General;  Laterality: N/A;  . MOUTH SURGERY    . NASAL SINUS SURGERY     Social History   Occupational History  . Not on file  Tobacco Use  . Smoking status: Never Smoker  . Smokeless tobacco: Never Used  Vaping Use  . Vaping Use: Never used  Substance and Sexual Activity  . Alcohol use: No    Comment: social  . Drug use: No  . Sexual activity: Yes    Birth control/protection: I.U.D.

## 2020-02-06 ENCOUNTER — Encounter: Payer: Self-pay | Admitting: Family Medicine

## 2020-02-06 ENCOUNTER — Other Ambulatory Visit: Payer: Self-pay

## 2020-02-06 ENCOUNTER — Ambulatory Visit: Payer: 59 | Admitting: Family Medicine

## 2020-02-06 DIAGNOSIS — M25561 Pain in right knee: Secondary | ICD-10-CM

## 2020-02-06 NOTE — Progress Notes (Signed)
Office Visit Note   Patient: Katelyn Reyes           Date of Birth: 11-18-1960           MRN: [id number] Visit Date: 02/06/2020 Requested by: Katelyn Minium, MD [address] [place],  [place] [address] PCP: Katelyn Minium, MD  Subjective: Chief Complaint  Patient presents with  . Right Knee - Pain    HPI: She is here with persistent right knee pain.  Aspiration and injection gave 2 weeks of some relief, but not complete.  The pain is now as bad as it was before.  No definite locking symptoms, but she does have some popping and deep pain.              ROS:   All other systems were reviewed and are negative.  Objective: Vital Signs: There were no vitals taken for this visit.  Physical Exam:  General:  Alert and oriented, in no acute distress. Pulm:  Breathing unlabored. Psy:  Normal mood, congruent affect.  Right knee: 1+ effusion with no warmth.  No pain with patella compression, mild tenderness on the medial and lateral joint lines.  No palpable click with McMurray's.  Imaging: None today  Assessment & Plan: 1.  Persistent right knee pain, with underlying osteoarthritis, cannot rule out loose body or meniscus tear. -Elected to proceed with MRI scan.  Based on the results, if meniscal tear or loose body is seen we will have her consult with surgeon.  If only arthritis, then we will request approval for gel injections.     Procedures: No procedures performed  No notes on file     PMFS History: Patient Active Problem List   Diagnosis Date Noted  . Obesity (BMI 30-39.9) 07/12/2018  . Cervical spondylosis with radiculopathy 11/12/2017  . Vitamin D deficiency 07/07/2017  . Urinary incontinence in female 12/22/2013  . Asthma, mild intermittent 03/22/2013  . Vaginal itching 06/01/2012  . Thyromegaly 03/18/2012  . General medical examination 03/14/2011  . CHICKENPOX, HX OF 02/15/2010  . Hyperlipidemia 02/14/2009  . DEPRESSION 02/14/2009  . ALLERGIC  RHINITIS 02/14/2009   Past Medical History:  Diagnosis Date  . Allergic rhinitis   . Anxiety   . Asthma    related to seasonal allergies  . Breast calcification, right 02/02/2013   Surgically excised 02/17/13 > path benign   . Depression   . GERD (gastroesophageal reflux disease)   . Hyperlipidemia   . IUD 02/07/10    Family History  Problem Relation Age of Onset  . Hypertension Father   . Diabetes Father   . Fibrocystic breast disease Mother   . Stroke Maternal Grandmother   . Colon cancer Paternal Grandmother   . Breast cancer Other        aunt  . Coronary artery disease Neg Hx     Past Surgical History:  Procedure Laterality Date  . BREAST BIOPSY Right 02/17/2013   Procedure:  NEEDLE LOCALIZATION REMOVAL RIGHT BREAST CALCIFICATIONS;  Surgeon: Haywood Lasso, MD;  Location: [facility];  Service: General;  Laterality: Right;  . BREAST EXCISIONAL BIOPSY Right pt unsure   benign  . BURCH PROCEDURE N/A 12/22/2013   Procedure: BURCH CYSTO URETHROPEXY, ANTERIOR AND POSTERIOR CULPORRHAPHY;  Surgeon: Ena Dawley, MD;  Location: [facility];  Service: Gynecology;  Laterality: N/A;  . CERVICAL DISC ARTHROPLASTY N/A 11/12/2017   Procedure: ARTIFICIAL DISC REPLACEMENT CERVICAL SIX-SEVEN;  Surgeon: Ashok Pall, MD;  Location: [facility];  Service: Neurosurgery;  Laterality: N/A;  ARTIFICIAL DISC REPLACEMENT CERVICAL 6- CERVICAL 7  . CHOLECYSTECTOMY N/A 10/09/2017   Procedure: LAPAROSCOPIC CHOLECYSTECTOMY WITH INTRAOPERATIVE CHOLANGIOGRAM;  Surgeon: Judeth Horn, MD;  Location: [facility];  Service: General;  Laterality: N/A;  . MOUTH SURGERY    . NASAL SINUS SURGERY     Social History   Occupational History  . Not on file  Tobacco Use  . Smoking status: Never Smoker  . Smokeless tobacco: Never Used  Vaping Use  . Vaping Use: Never used  Substance and Sexual Activity  . Alcohol use: No    Comment: social  . Drug use: No  . Sexual activity: Yes    Birth control/protection:  I.U.D.

## 2020-02-16 ENCOUNTER — Other Ambulatory Visit: Payer: Self-pay | Admitting: Family Medicine

## 2020-02-16 MED FILL — MONTELUKAST SOD 10 MG TAB: 10 | 90 days supply | Qty: 90 | Fill #0

## 2020-02-16 MED FILL — FLUTICASONE PROP 50 MCG SPR: 50 | 90 days supply | Qty: 48 | Fill #2

## 2020-02-26 ENCOUNTER — Ambulatory Visit
Admission: RE | Admit: 2020-02-26 | Discharge: 2020-02-26 | Disposition: A | Discharge status: Home / Self Care | Payer: 59 | Source: Ambulatory Visit | Attending: Family Medicine | Admitting: Family Medicine

## 2020-02-26 DIAGNOSIS — M25561 Pain in right knee: Secondary | ICD-10-CM

## 2020-02-26 DIAGNOSIS — S83231A Complex tear of medial meniscus, current injury, right knee, initial encounter: Secondary | ICD-10-CM | POA: Diagnosis not present

## 2020-02-27 ENCOUNTER — Telehealth: Payer: Self-pay | Admitting: Family Medicine

## 2020-02-27 NOTE — Telephone Encounter (Signed)
MRI shows a tear of the medial meniscus cartilage, but there's also a substantial amount of arthritis in the knee.  Arthroscopic surgery could potentially make her pain worse.  Knee replacement might be a future option if we can't get the pain settled down.  Would suggest trying "gel" injections first.  If she's in agreement, we will request insurance approval.

## 2020-02-28 ENCOUNTER — Telehealth: Payer: Self-pay

## 2020-02-28 NOTE — Telephone Encounter (Signed)
I called and spoke with the patient. Duplicate - See other message regarding her MRI results.

## 2020-02-28 NOTE — Telephone Encounter (Signed)
I called and advised the patient of her findings and recommendations. She is leaning toward going ahead with arthroscopic surgery. She would like to either see Dr. Adriana Mccallum at [facility] or Dr. Marlou Sa here in our office.   However, the patient would like to speak directly with you about this first, as soon as you are able to call her.

## 2020-02-28 NOTE — Telephone Encounter (Signed)
Patient would like a call back to discuss her MRI results for her right knee.  CB# [phone].  Please advise.  Thank you

## 2020-02-29 ENCOUNTER — Other Ambulatory Visit: Payer: Self-pay | Admitting: Family Medicine

## 2020-02-29 DIAGNOSIS — M1711 Unilateral primary osteoarthritis, right knee: Secondary | ICD-10-CM

## 2020-02-29 DIAGNOSIS — M25561 Pain in right knee: Secondary | ICD-10-CM

## 2020-02-29 NOTE — Telephone Encounter (Signed)
Please schedule consult with SD for knee replacement.

## 2020-02-29 NOTE — Telephone Encounter (Signed)
Can you please get patient scheduled to discuss surgery with Dr Marlou Sa? Thanks.

## 2020-03-02 NOTE — Telephone Encounter (Signed)
FYI

## 2020-03-13 ENCOUNTER — Other Ambulatory Visit: Payer: Self-pay | Admitting: Family Medicine

## 2020-03-13 DIAGNOSIS — S83241A Other tear of medial meniscus, current injury, right knee, initial encounter: Secondary | ICD-10-CM | POA: Diagnosis not present

## 2020-03-13 DIAGNOSIS — M1711 Unilateral primary osteoarthritis, right knee: Secondary | ICD-10-CM | POA: Diagnosis not present

## 2020-03-13 DIAGNOSIS — M25561 Pain in right knee: Secondary | ICD-10-CM | POA: Diagnosis not present

## 2020-03-13 MED FILL — PANTOPRAZOLE SOD DR 40 MG T: 40 | 90 days supply | Qty: 90 | Fill #2

## 2020-03-13 MED FILL — ZOLPIDEM TARTRATE 10 MG TAB: 10 | 30 days supply | Qty: 30 | Fill #0

## 2020-03-13 MED FILL — ALPRAZolam 0.5 MG TABS: 0.5 | 20 days supply | Qty: 60 | Fill #0

## 2020-03-13 NOTE — Telephone Encounter (Signed)
Last OV 12/26/19 Alprazolam last filled 07/15/19 #60 with 3 Zolpidem last filled 07/15/19 #30 with 3

## 2020-03-21 ENCOUNTER — Other Ambulatory Visit (HOSPITAL_COMMUNITY): Payer: Self-pay | Admitting: Obstetrics and Gynecology

## 2020-03-21 DIAGNOSIS — N951 Menopausal and female climacteric states: Secondary | ICD-10-CM | POA: Diagnosis not present

## 2020-03-21 DIAGNOSIS — Z01419 Encounter for gynecological examination (general) (routine) without abnormal findings: Secondary | ICD-10-CM | POA: Diagnosis not present

## 2020-03-21 DIAGNOSIS — Z975 Presence of (intrauterine) contraceptive device: Secondary | ICD-10-CM | POA: Diagnosis not present

## 2020-03-21 DIAGNOSIS — Z6837 Body mass index (BMI) 37.0-37.9, adult: Secondary | ICD-10-CM | POA: Diagnosis not present

## 2020-03-21 DIAGNOSIS — Z124 Encounter for screening for malignant neoplasm of cervix: Secondary | ICD-10-CM | POA: Diagnosis not present

## 2020-03-21 LAB — HM PAP SMEAR: HM Pap smear: NORMAL

## 2020-03-21 LAB — RESULTS CONSOLE HPV: CHL HPV: NEGATIVE

## 2020-03-21 MED FILL — ESTRADIOL 1 MG TABS: 1 | 90 days supply | Qty: 90 | Fill #0

## 2020-03-21 MED FILL — PROGESTERONE MICRONIZED 200: 200 | 90 days supply | Qty: 90 | Fill #0

## 2020-04-16 ENCOUNTER — Ambulatory Visit: Payer: 59 | Attending: Internal Medicine

## 2020-04-16 DIAGNOSIS — Z23 Encounter for immunization: Secondary | ICD-10-CM

## 2020-04-16 NOTE — Progress Notes (Signed)
   Covid-19 Vaccination Clinic  Name:  Katelyn Reyes    MRN: [id number] DOB: May 21, 1961  04/16/2020  [name] was observed post Covid-19 immunization for 15 minutes without incident. She was provided with Vaccine Information Sheet and instruction to access the V-Safe system.   [name] was instructed to call 911 with any severe reactions post vaccine: Marland Kitchen Difficulty breathing  . Swelling of face and throat  . A fast heartbeat  . A bad rash all over body  . Dizziness and weakness

## 2020-04-16 NOTE — Progress Notes (Signed)
   Covid-19 Vaccination Clinic  Name:  Katelyn Reyes    MRN: [id number] DOB: 1961-05-27  04/16/2020  [name] was observed post Covid-19 immunization for 15 minutes without incident. She was provided with Vaccine Information Sheet and instruction to access the V-Safe system.   [name] was instructed to call 911 with any severe reactions post vaccine: Marland Kitchen Difficulty breathing  . Swelling of face and throat  . A fast heartbeat  . A bad rash all over body  . Dizziness and weakness

## 2020-04-18 NOTE — Patient Instructions (Signed)
DUE TO COVID-19 ONLY ONE VISITOR IS ALLOWED TO COME WITH YOU AND STAY IN THE WAITING ROOM ONLY DURING PRE OP AND PROCEDURE DAY OF SURGERY. THE 1 VISITOR  MAY VISIT WITH YOU AFTER SURGERY IN YOUR PRIVATE ROOM DURING VISITING HOURS ONLY!  YOU NEED TO HAVE A COVID 19 TEST ON: 04/27/20 @           , THIS TEST MUST BE DONE BEFORE SURGERY,  COVID TESTING SITE [place] [address], IT IS ON THE RIGHT GOING OUT [address] APPROXIMATELY  2 MINUTES PAST [employer] ON THE RIGHT. ONCE YOUR COVID TEST IS COMPLETED,  PLEASE BEGIN THE QUARANTINE INSTRUCTIONS AS OUTLINED IN YOUR HANDOUT.                Katelyn Reyes    Your procedure is scheduled on: 05/01/20   Report to [facility] Main  Entrance   Report to short stay at: 5:30 AM     Call this number if you have problems the morning of surgery [phone]    Remember:   NO SOLID FOOD AFTER MIDNIGHT THE NIGHT PRIOR TO SURGERY. NOTHING BY MOUTH EXCEPT CLEAR LIQUIDS UNTIL: 4:30 AM . PLEASE FINISH ENSURE DRINK PER SURGEON ORDER  WHICH NEEDS TO BE COMPLETED AT: 4:30 AM .  CLEAR LIQUID DIET   Foods Allowed                                                                     Foods Excluded  Coffee and tea, regular and decaf                             liquids that you cannot  Plain Jell-O any favor except red or purple                                           see through such as: Fruit ices (not with fruit pulp)                                     milk, soups, orange juice  Iced Popsicles                                    All solid food Carbonated beverages, regular and diet                                    Cranberry, grape and apple juices Sports drinks like Gatorade Lightly seasoned clear broth or consume(fat free) Sugar, honey syrup  Sample Menu Breakfast                                Lunch  Supper Cranberry juice                    Beef broth                             Chicken broth Jell-O                                     Grape juice                           Apple juice Coffee or tea                        Jell-O                                      Popsicle                                                Coffee or tea                        Coffee or tea  _____________________________________________________________________   BRUSH YOUR TEETH MORNING OF SURGERY AND RINSE YOUR MOUTH OUT, NO CHEWING GUM CANDY OR MINTS.     Take these medicines the morning of surgery with A SIP OF WATER: cetirizine,pantoprazole.Use inhalers,xanax,nasal spray and flonase as usual.              You may not have any metal on your body including hair pins and              piercings  Do not wear jewelry, make-up, lotions, powders or perfumes, deodorant             Do not wear nail polish on your fingernails.  Do not shave  48 hours prior to surgery.             Do not bring valuables to the hospital. [place].  Contacts, dentures or bridgework may not be worn into surgery.  Leave suitcase in the car. After surgery it may be brought to your room.     Patients discharged the day of surgery will not be allowed to drive home. IF YOU ARE HAVING SURGERY AND GOING HOME THE SAME DAY, YOU MUST HAVE AN ADULT TO DRIVE YOU HOME AND BE WITH YOU FOR 24 HOURS. YOU MAY GO HOME BY TAXI OR UBER OR ORTHERWISE, BUT AN ADULT MUST ACCOMPANY YOU HOME AND STAY WITH YOU FOR 24 HOURS.  Name and phone number of your driver:  Special Instructions: N/A              Please read over the following fact sheets you were given: _____________________________________________________________________        [facility] - Preparing for Surgery Before surgery, you can play an important role.  Because skin is not sterile, your skin needs to be as free of germs as possible.  You can reduce the  number of germs on your skin by washing with CHG  (chlorahexidine gluconate) soap before surgery.  CHG is an antiseptic cleaner which kills germs and bonds with the skin to continue killing germs even after washing. Please DO NOT use if you have an allergy to CHG or antibacterial soaps.  If your skin becomes reddened/irritated stop using the CHG and inform your nurse when you arrive at Short Stay. Do not shave (including legs and underarms) for at least 48 hours prior to the first CHG shower.  You may shave your face/neck. Please follow these instructions carefully:  1.  Shower with CHG Soap the night before surgery and the  morning of Surgery.  2.  If you choose to wash your hair, wash your hair first as usual with your  normal  shampoo.  3.  After you shampoo, rinse your hair and body thoroughly to remove the  shampoo.                           4.  Use CHG as you would any other liquid soap.  You can apply chg directly  to the skin and wash                       Gently with a scrungie or clean washcloth.  5.  Apply the CHG Soap to your body ONLY FROM THE NECK DOWN.   Do not use on face/ open                           Wound or open sores. Avoid contact with eyes, ears mouth and genitals (private parts).                       Wash face,  Genitals (private parts) with your normal soap.             6.  Wash thoroughly, paying special attention to the area where your surgery  will be performed.  7.  Thoroughly rinse your body with warm water from the neck down.  8.  DO NOT shower/wash with your normal soap after using and rinsing off  the CHG Soap.                9.  Pat yourself dry with a clean towel.            10.  Wear clean pajamas.            11.  Place clean sheets on your bed the night of your first shower and do not  sleep with pets. Day of Surgery : Do not apply any lotions/deodorants the morning of surgery.  Please wear clean clothes to the hospital/surgery center.  FAILURE TO FOLLOW THESE INSTRUCTIONS MAY RESULT IN THE CANCELLATION OF  YOUR SURGERY PATIENT SIGNATURE_________________________________  NURSE SIGNATURE__________________________________  ________________________________________________________________________   Katelyn Reyes  An incentive spirometer is a tool that can help keep your lungs clear and active. This tool measures how well you are filling your lungs with each breath. Taking long deep breaths may help reverse or decrease the chance of developing breathing (pulmonary) problems (especially infection) following:  A long period of time when you are unable to move or be active. BEFORE THE PROCEDURE   If the spirometer includes an indicator to show your best effort, your nurse or respiratory therapist will set it to a  desired goal.  If possible, sit up straight or lean slightly forward. Try not to slouch.  Hold the incentive spirometer in an upright position. INSTRUCTIONS FOR USE  1. Sit on the edge of your bed if possible, or sit up as far as you can in bed or on a chair. 2. Hold the incentive spirometer in an upright position. 3. Breathe out normally. 4. Place the mouthpiece in your mouth and seal your lips tightly around it. 5. Breathe in slowly and as deeply as possible, raising the piston or the ball toward the top of the column. 6. Hold your breath for 3-5 seconds or for as long as possible. Allow the piston or ball to fall to the bottom of the column. 7. Remove the mouthpiece from your mouth and breathe out normally. 8. Rest for a few seconds and repeat Steps 1 through 7 at least 10 times every 1-2 hours when you are awake. Take your time and take a few normal breaths between deep breaths. 9. The spirometer may include an indicator to show your best effort. Use the indicator as a goal to work toward during each repetition. 10. After each set of 10 deep breaths, practice coughing to be sure your lungs are clear. If you have an incision (the cut made at the time of surgery), support your  incision when coughing by placing a pillow or rolled up towels firmly against it. Once you are able to get out of bed, walk around indoors and cough well. You may stop using the incentive spirometer when instructed by your caregiver.  RISKS AND COMPLICATIONS  Take your time so you do not get dizzy or light-headed.  If you are in pain, you may need to take or ask for pain medication before doing incentive spirometry. It is harder to take a deep breath if you are having pain. AFTER USE  Rest and breathe slowly and easily.  It can be helpful to keep track of a log of your progress. Your caregiver can provide you with a simple table to help with this. If you are using the spirometer at home, follow these instructions: [place] IF:   You are having difficultly using the spirometer.  You have trouble using the spirometer as often as instructed.  Your pain medication is not giving enough relief while using the spirometer.  You develop fever of 100.5 F (38.1 C) or higher. SEEK IMMEDIATE MEDICAL CARE IF:   You cough up bloody sputum that had not been present before.  You develop fever of 102 F (38.9 C) or greater.  You develop worsening pain at or near the incision site. MAKE SURE YOU:   Understand these instructions.  Will watch your condition.  Will get help right away if you are not doing well or get worse. Document Released: 10/13/2006 Document Revised: 08/25/2011 Document Reviewed: 12/14/2006 [facility] Patient Information 2014 [place], [place].   ________________________________________________________________________

## 2020-04-19 ENCOUNTER — Encounter (HOSPITAL_COMMUNITY)
Admission: RE | Admit: 2020-04-19 | Discharge: 2020-04-19 | Disposition: A | Discharge status: Home / Self Care | Payer: 59 | Source: Ambulatory Visit | Attending: Orthopedic Surgery | Admitting: Orthopedic Surgery

## 2020-04-19 ENCOUNTER — Other Ambulatory Visit: Payer: Self-pay

## 2020-04-19 ENCOUNTER — Encounter (HOSPITAL_COMMUNITY): Payer: Self-pay

## 2020-04-19 DIAGNOSIS — Z01818 Encounter for other preprocedural examination: Secondary | ICD-10-CM | POA: Diagnosis not present

## 2020-04-19 HISTORY — DX: Unspecified osteoarthritis, unspecified site: M19.90

## 2020-04-19 LAB — CBC
HCT: 38.4 % (ref 36.0–46.0)
Hemoglobin: 12.4 g/dL (ref 12.0–15.0)
MCH: 31.3 pg (ref 26.0–34.0)
MCHC: 32.3 g/dL (ref 30.0–36.0)
MCV: 97 fL (ref 80.0–100.0)
Platelets: 291 10*3/uL (ref 150–400)
RBC: 3.96 MIL/uL (ref 3.87–5.11)
RDW: 12.2 % (ref 11.5–15.5)
WBC: 8.3 10*3/uL (ref 4.0–10.5)
nRBC: 0 % (ref 0.0–0.2)

## 2020-04-19 NOTE — Progress Notes (Signed)
COVID Vaccine Completed: Yes Date COVID Vaccine completed: 04/16/20 COVID vaccine manufacturer: Pfizer    PCP - Dr. Annye Asa. LOV: 12/26/19 Cardiologist -   Chest x-ray -  EKG -  Stress Test -  ECHO -  Cardiac Cath -  Pacemaker/ICD device last checked:  Sleep Study -  CPAP -   Fasting Blood Sugar -  Checks Blood Sugar _____ times a day  Blood Thinner Instructions: Aspirin Instructions: Last Dose:  Anesthesia review:   Patient denies shortness of breath, fever, cough and chest pain at PAT appointment   Patient verbalized understanding of instructions that were given to them at the PAT appointment. Patient was also instructed that they will need to review over the PAT instructions again at home before surgery.

## 2020-04-19 NOTE — Progress Notes (Addendum)
YOU ARE SCHEDULED FOR A COVID TEST _________@____________ . THIS TEST MUST BE DONE BEFORE SURGERY. GO TO  [facility] [place], [place], IT IS APPROXIMATELY 2 MINUTES PAST [employer] ON THE RIGHT AND REMAIN IN YOUR CAR, THIS IS A DRIVE UP TEST. ONCE YOUR COVID TEST IS DONE PLEASE FOLLOW ALL THE QUARANTINE  INSTRUCTIONS GIVEN IN YOUR HANDOUT.      Your procedure is scheduled on   Report to [facility] AT  5:30 A. M.   Call this number if you have problems the morning of surgery  :[phone].   OUR ADDRESS IS [place].  WE ARE LOCATED IN [facility].  PLEASE BRING YOUR INSURANCE CARD AND PHOTO ID DAY OF SURGERY.  ONLY ONE PERSON ALLOWED IN FACILITY WAITING AREA.                                     REMEMBER:  NO SOLID FOOD AFTER MIDNIGHT THE NIGHT PRIOR TO SURGERY. NOTHING BY MOUTH EXCEPT CLEAR LIQUIDS UNTIL 4:30 AM.  PLEASE FINISH ENSURE DRINK PER SURGEON ORDER  WHICH NEEDS TO BE COMPLETED AT 4:30 AM     CLEAR LIQUID DIET   Foods Allowed                                                                     Foods Excluded  Coffee and tea, regular and decaf                             liquids that you cannot  Plain Jell-O any favor except red or purple                                           see through such as: Fruit ices (not with fruit pulp)                                     milk, soups, orange juice  Iced Popsicles                                    All solid food Carbonated beverages, regular and diet                                    Cranberry, grape and apple juices Sports drinks like Gatorade Lightly seasoned clear broth or consume(fat free) Sugar, honey syrup  Sample Menu Breakfast                                Lunch  Supper Cranberry juice                    Beef broth                            Chicken broth Jell-O                                     Grape juice                            Apple juice Coffee or tea                        Jell-O                                      Popsicle                                                Coffee or tea                        Coffee or tea    TAKE THESE MEDICATIONS MORNING OF SURGERY WITH A SIP OF WATER:   Pantoprozole (protonix), Cetrizine (zyrtec) and if needed do inhalers take xanax, nasal sprays.  Please bring inhalers with you day of surgery  ONE VISITOR IS ALLOWED IN WAITING ROOM ONLY DAY OF SURGERY.  NO VISITOR MAY SPEND THE NIGHT.  VISITOR ARE ALLOWED TO STAY UNTIL 800 PM.                                    DO NOT WEAR JEWERLY, MAKE UP, OR NAIL POLISH ON FINGERNAILS. DO NOT WEAR LOTIONS, POWDERS, PERFUMES OR DEODORANT. DO NOT SHAVE FOR 24 HOURS PRIOR TO DAY OF SURGERY. MEN MAY SHAVE FACE AND NECK. CONTACTS, GLASSES, OR DENTURES MAY NOT BE WORN TO SURGERY.                                    [facility] IS NOT RESPONSIBLE  FOR ANY BELONGINGS.   [facility] - Preparing for Surgery Before surgery, you can play an important role.  Because skin is not sterile, your skin needs to be as free of germs as possible.  You can reduce the number of germs on your skin by washing with CHG (chlorahexidine gluconate) soap before surgery.  CHG is an antiseptic cleaner which kills germs and bonds with the skin to continue killing germs even after washing. Please DO NOT use if you have an allergy to CHG or antibacterial soaps.  If your skin becomes reddened/irritated stop using the CHG and inform your nurse when you arrive at Short Stay. Do not shave (including legs and underarms) for at least 48 hours prior to the first CHG shower.  You may shave your face/neck. Please follow these instructions carefully:  1.  Shower with CHG Soap the night before  surgery and the  morning of Surgery.  2.  If you choose to wash your hair, wash your hair first as usual with your  normal  shampoo.  3.  After you shampoo, rinse your hair and body  thoroughly to remove the  shampoo.                            4.  Use CHG as you would any other liquid soap.  You can apply chg directly  to the skin and wash                       Gently with a scrungie or clean washcloth.  5.  Apply the CHG Soap to your body ONLY FROM THE NECK DOWN.   Do not use on face/ open                           Wound or open sores. Avoid contact with eyes, ears mouth and genitals (private parts).                       Wash face,  Genitals (private parts) with your normal soap.             6.  Wash thoroughly, paying special attention to the area where your surgery  will be performed.  7.  Thoroughly rinse your body with warm water from the neck down.  8.  DO NOT shower/wash with your normal soap after using and rinsing off  the CHG Soap.             9.  Pat yourself dry with a clean towel.            10.  Wear clean pajamas.            11.  Place clean sheets on your bed the night of your first shower and do not  sleep with pets. Day of Surgery : Do not apply any lotions/deodorants the morning of surgery.  Please wear clean clothes to the hospital/surgery center.  FAILURE TO FOLLOW THESE INSTRUCTIONS MAY RESULT IN THE CANCELLATION OF YOUR SURGERY PATIENT SIGNATURE_________________________________  NURSE SIGNATURE__________________________________  ________________________________________________________________________                                                                    .

## 2020-04-27 ENCOUNTER — Other Ambulatory Visit (HOSPITAL_COMMUNITY)
Admission: RE | Admit: 2020-04-27 | Discharge: 2020-04-27 | Disposition: A | Discharge status: Home / Self Care | Payer: 59 | Source: Ambulatory Visit | Attending: Cardiology | Admitting: Cardiology

## 2020-04-27 DIAGNOSIS — Z20822 Contact with and (suspected) exposure to covid-19: Secondary | ICD-10-CM | POA: Diagnosis not present

## 2020-04-27 DIAGNOSIS — Z01812 Encounter for preprocedural laboratory examination: Secondary | ICD-10-CM | POA: Diagnosis not present

## 2020-04-27 LAB — SARS CORONAVIRUS 2 (TAT 6-24 HRS): SARS Coronavirus 2: NEGATIVE

## 2020-04-30 ENCOUNTER — Telehealth: Payer: Self-pay

## 2020-04-30 NOTE — Telephone Encounter (Signed)
Patient returning [place] call for COVID test results - connected call to [facility]

## 2020-05-01 ENCOUNTER — Encounter (HOSPITAL_BASED_OUTPATIENT_CLINIC_OR_DEPARTMENT_OTHER)
Admission: RE | Disposition: A | Discharge status: Home / Self Care | Payer: Self-pay | Source: Home / Self Care | Attending: Orthopedic Surgery

## 2020-05-01 ENCOUNTER — Other Ambulatory Visit: Payer: Self-pay

## 2020-05-01 ENCOUNTER — Ambulatory Visit (HOSPITAL_BASED_OUTPATIENT_CLINIC_OR_DEPARTMENT_OTHER): Payer: 59 | Admitting: Certified Registered Nurse Anesthetist

## 2020-05-01 ENCOUNTER — Encounter (HOSPITAL_BASED_OUTPATIENT_CLINIC_OR_DEPARTMENT_OTHER): Payer: Self-pay | Admitting: Orthopedic Surgery

## 2020-05-01 ENCOUNTER — Ambulatory Visit (HOSPITAL_COMMUNITY)
Admission: RE | Admit: 2020-05-01 | Discharge: 2020-05-01 | Disposition: A | Discharge status: Home / Self Care | Payer: 59 | Attending: Orthopedic Surgery | Admitting: Orthopedic Surgery

## 2020-05-01 ENCOUNTER — Other Ambulatory Visit (HOSPITAL_COMMUNITY): Payer: Self-pay | Admitting: Student

## 2020-05-01 DIAGNOSIS — M94261 Chondromalacia, right knee: Secondary | ICD-10-CM | POA: Insufficient documentation

## 2020-05-01 DIAGNOSIS — M23321 Other meniscus derangements, posterior horn of medial meniscus, right knee: Secondary | ICD-10-CM | POA: Diagnosis not present

## 2020-05-01 DIAGNOSIS — S83231A Complex tear of medial meniscus, current injury, right knee, initial encounter: Secondary | ICD-10-CM | POA: Insufficient documentation

## 2020-05-01 DIAGNOSIS — X58XXXA Exposure to other specified factors, initial encounter: Secondary | ICD-10-CM | POA: Diagnosis not present

## 2020-05-01 DIAGNOSIS — K219 Gastro-esophageal reflux disease without esophagitis: Secondary | ICD-10-CM | POA: Diagnosis not present

## 2020-05-01 DIAGNOSIS — Z79899 Other long term (current) drug therapy: Secondary | ICD-10-CM | POA: Insufficient documentation

## 2020-05-01 DIAGNOSIS — M2241 Chondromalacia patellae, right knee: Secondary | ICD-10-CM | POA: Diagnosis not present

## 2020-05-01 DIAGNOSIS — S83241D Other tear of medial meniscus, current injury, right knee, subsequent encounter: Secondary | ICD-10-CM

## 2020-05-01 DIAGNOSIS — J45909 Unspecified asthma, uncomplicated: Secondary | ICD-10-CM | POA: Diagnosis not present

## 2020-05-01 HISTORY — PX: KNEE ARTHROSCOPY WITH MEDIAL MENISECTOMY: SHX5651

## 2020-05-01 LAB — TYPE AND SCREEN
ABO/RH(D): O POS
Antibody Screen: NEGATIVE

## 2020-05-01 LAB — ABO/RH: ABO/RH(D): O POS

## 2020-05-01 SURGERY — ARTHROSCOPY, KNEE, WITH MEDIAL MENISCECTOMY
Anesthesia: General | Site: Knee | Laterality: Right

## 2020-05-01 MED ORDER — CHLORHEXIDINE GLUCONATE 0.12 % MT SOLN: 15.0000 mL | Freq: Once | OROMUCOSAL | Status: DC

## 2020-05-01 MED ORDER — EPHEDRINE SULFATE-NACL 50-0.9 MG/10ML-% IV SOSY
PREFILLED_SYRINGE | INTRAVENOUS | Status: DC | PRN
  Administered 2020-05-01 (×2): 10 mg via INTRAVENOUS

## 2020-05-01 MED ORDER — LIDOCAINE 2% (20 MG/ML) 5 ML SYRINGE
INTRAMUSCULAR | Status: AC
  Filled 2020-05-01: qty 5

## 2020-05-01 MED ORDER — LIDOCAINE-EPINEPHRINE 1 %-1:100000 IJ SOLN
INTRAMUSCULAR | Status: DC | PRN
  Administered 2020-05-01: 20 mL

## 2020-05-01 MED ORDER — OXYCODONE HCL 5 MG/5ML PO SOLN: 5.0000 mg | Freq: Once | ORAL | Status: DC | PRN

## 2020-05-01 MED ORDER — BUPIVACAINE-EPINEPHRINE 0.5% -1:200000 IJ SOLN
INTRAMUSCULAR | Status: DC | PRN
  Administered 2020-05-01: 30 mL

## 2020-05-01 MED ORDER — SODIUM CHLORIDE 0.9 % IR SOLN
Status: DC | PRN
Start: 2020-05-01 — End: 2020-05-01
  Administered 2020-05-01: 6000 mL

## 2020-05-01 MED ORDER — MIDAZOLAM HCL 2 MG/2ML IJ SOLN
INTRAMUSCULAR | Status: AC
  Filled 2020-05-01: qty 2

## 2020-05-01 MED ORDER — ACETAMINOPHEN 10 MG/ML IV SOLN: 1000.0000 mg | Freq: Once | INTRAVENOUS | Status: DC | PRN

## 2020-05-01 MED ORDER — DEXAMETHASONE SODIUM PHOSPHATE 10 MG/ML IJ SOLN
INTRAMUSCULAR | Status: DC | PRN
  Administered 2020-05-01: 10 mg via INTRAVENOUS

## 2020-05-01 MED ORDER — ONDANSETRON HCL 4 MG/2ML IJ SOLN
INTRAMUSCULAR | Status: DC | PRN
  Administered 2020-05-01: 4 mg via INTRAVENOUS

## 2020-05-01 MED ORDER — VANCOMYCIN HCL IN DEXTROSE 1-5 GM/200ML-% IV SOLN
INTRAVENOUS | Status: AC
  Filled 2020-05-01: qty 200

## 2020-05-01 MED ORDER — DEXAMETHASONE SODIUM PHOSPHATE 10 MG/ML IJ SOLN
INTRAMUSCULAR | Status: AC
  Filled 2020-05-01: qty 1

## 2020-05-01 MED ORDER — PROPOFOL 10 MG/ML IV BOLUS
INTRAVENOUS | Status: AC
  Filled 2020-05-01: qty 20

## 2020-05-01 MED ORDER — ACETAMINOPHEN 500 MG PO TABS: 1000.0000 mg | ORAL_TABLET | Freq: Once | ORAL | Status: DC | PRN

## 2020-05-01 MED ORDER — TRAMADOL HCL 50 MG PO TABS: 50.0000 mg | ORAL_TABLET | Freq: Four times a day (QID) | ORAL | Status: DC | PRN

## 2020-05-01 MED ORDER — LACTATED RINGERS IV SOLN: INTRAVENOUS | Status: DC

## 2020-05-01 MED ORDER — KETOROLAC TROMETHAMINE 30 MG/ML IJ SOLN
INTRAMUSCULAR | Status: DC | PRN
  Administered 2020-05-01: 30 mg via INTRAVENOUS

## 2020-05-01 MED ORDER — MIDAZOLAM HCL 5 MG/5ML IJ SOLN
INTRAMUSCULAR | Status: DC | PRN
  Administered 2020-05-01: 2 mg via INTRAVENOUS

## 2020-05-01 MED ORDER — KETOROLAC TROMETHAMINE 30 MG/ML IJ SOLN
INTRAMUSCULAR | Status: AC
  Filled 2020-05-01: qty 1

## 2020-05-01 MED ORDER — DEXAMETHASONE SODIUM PHOSPHATE 10 MG/ML IJ SOLN: 10.0000 mg | Freq: Once | INTRAMUSCULAR | Status: DC

## 2020-05-01 MED ORDER — OXYCODONE HCL 5 MG PO TABS: 5.0000 mg | ORAL_TABLET | Freq: Once | ORAL | Status: DC | PRN

## 2020-05-01 MED ORDER — FENTANYL CITRATE (PF) 100 MCG/2ML IJ SOLN
INTRAMUSCULAR | Status: DC | PRN
  Administered 2020-05-01 (×2): 50 ug via INTRAVENOUS

## 2020-05-01 MED ORDER — VANCOMYCIN HCL IN DEXTROSE 1-5 GM/200ML-% IV SOLN
1000.0000 mg | INTRAVENOUS | Status: AC
  Administered 2020-05-01: 1000 mg via INTRAVENOUS

## 2020-05-01 MED ORDER — FENTANYL CITRATE (PF) 100 MCG/2ML IJ SOLN
INTRAMUSCULAR | Status: AC
  Filled 2020-05-01: qty 2

## 2020-05-01 MED ORDER — PROPOFOL 10 MG/ML IV BOLUS
INTRAVENOUS | Status: DC | PRN
  Administered 2020-05-01: 200 mg via INTRAVENOUS

## 2020-05-01 MED ORDER — ONDANSETRON HCL 4 MG/2ML IJ SOLN
INTRAMUSCULAR | Status: AC
  Filled 2020-05-01: qty 2

## 2020-05-01 MED ORDER — LIDOCAINE 2% (20 MG/ML) 5 ML SYRINGE
INTRAMUSCULAR | Status: DC | PRN
  Administered 2020-05-01: 80 mg via INTRAVENOUS

## 2020-05-01 MED ORDER — ACETAMINOPHEN 160 MG/5ML PO SOLN: 1000.0000 mg | Freq: Once | ORAL | Status: DC | PRN

## 2020-05-01 MED ORDER — FENTANYL CITRATE (PF) 100 MCG/2ML IJ SOLN: 25.0000 ug | INTRAMUSCULAR | Status: DC | PRN

## 2020-05-01 MED ORDER — ORAL CARE MOUTH RINSE: 15.0000 mL | Freq: Once | OROMUCOSAL | Status: DC

## 2020-05-01 MED ORDER — EPHEDRINE 5 MG/ML INJ
INTRAVENOUS | Status: AC
  Filled 2020-05-01: qty 10

## 2020-05-01 MED FILL — traMADol HCL 50 MG TABS: 50 | 8 days supply | Qty: 30 | Fill #0

## 2020-05-01 MED FILL — MELOXICAM 15 MG TABLET: 15 | 55 days supply | Qty: 60 | Fill #0

## 2020-05-01 SURGICAL SUPPLY — 33 items
BNDG CMPR MED 15X6 ELC VLCR LF (GAUZE/BANDAGES/DRESSINGS) ×1
BNDG ELASTIC 6X15 VLCR STRL LF (GAUZE/BANDAGES/DRESSINGS) ×2 IMPLANT
BNDG ELASTIC 6X5.8 VLCR STR LF (GAUZE/BANDAGES/DRESSINGS) ×2 IMPLANT
COVER WAND RF STERILE (DRAPES) ×2 IMPLANT
DISSECTOR 4.0MM X 13CM (MISCELLANEOUS) IMPLANT
DRAPE ARTHROSCOPY W/POUCH 114 (DRAPES) ×4 IMPLANT
DRAPE U-SHAPE 47X51 STRL (DRAPES) ×2 IMPLANT
DRSG EMULSION OIL 3X3 NADH (GAUZE/BANDAGES/DRESSINGS) ×2 IMPLANT
DRSG PAD ABDOMINAL 8X10 ST (GAUZE/BANDAGES/DRESSINGS) ×2 IMPLANT
DURAPREP 26ML APPLICATOR (WOUND CARE) ×2 IMPLANT
GAUZE SPONGE 4X4 12PLY STRL (GAUZE/BANDAGES/DRESSINGS) ×2 IMPLANT
GLOVE BIO SURGEON STRL SZ7.5 (GLOVE) ×2 IMPLANT
GLOVE BIOGEL PI IND STRL 6.5 (GLOVE) ×1 IMPLANT
GLOVE BIOGEL PI IND STRL 7.0 (GLOVE) ×1 IMPLANT
GLOVE BIOGEL PI IND STRL 7.5 (GLOVE) ×1 IMPLANT
GLOVE BIOGEL PI INDICATOR 6.5 (GLOVE) ×1
GLOVE BIOGEL PI INDICATOR 7.0 (GLOVE) ×1
GLOVE BIOGEL PI INDICATOR 7.5 (GLOVE) ×1
GOWN STRL REUS W/TWL LRG LVL3 (GOWN DISPOSABLE) ×4 IMPLANT
IV NS IRRIG 3000ML ARTHROMATIC (IV SOLUTION) ×2 IMPLANT
KIT TURNOVER CYSTO (KITS) ×2 IMPLANT
MANIFOLD NEPTUNE II (INSTRUMENTS) ×2 IMPLANT
NS IRRIG 500ML POUR BTL (IV SOLUTION) ×2 IMPLANT
PACK ARTHROSCOPY DSU (CUSTOM PROCEDURE TRAY) ×2 IMPLANT
PACK BASIN DAY SURGERY FS (CUSTOM PROCEDURE TRAY) ×2 IMPLANT
PADDING CAST COTTON 6X4 STRL (CAST SUPPLIES) ×2 IMPLANT
PROBE BIPOLAR ATHRO 135MM 90D (MISCELLANEOUS) IMPLANT
SUT ETHILON 4 0 PS 2 18 (SUTURE) ×2 IMPLANT
SYR 30ML LL (SYRINGE) ×2 IMPLANT
TOWEL OR 17X26 10 PK STRL BLUE (TOWEL DISPOSABLE) ×2 IMPLANT
TUBE CONNECTING 12X1/4 (SUCTIONS) ×2 IMPLANT
TUBING ARTHRO INFLOW-ONLY STRL (TUBING) ×2 IMPLANT
WATER STERILE IRR 500ML POUR (IV SOLUTION) IMPLANT

## 2020-05-01 NOTE — Brief Op Note (Signed)
05/01/2020  8:20 AM  PATIENT:  Katelyn Reyes  59 y.o. female  PRE-OPERATIVE DIAGNOSIS:  Right knee medial meniscal tear, chondromalacia medial and patellofemoral  POST-OPERATIVE DIAGNOSIS:  Right knee complex medial meniscal tear, grade II-III chondromalacia MFC, and grade III and small area of IV lateral femoral trochlea  PROCEDURE:  Procedure(s): KNEE ARTHROSCOPY WITH PARTIAL MEDIAL MENISECTOMY,   PATELLA  CHONDROPLASTY (Right)  SURGEON:  Surgeon(s) and Role:    Paralee Cancel, MD - Primary  PHYSICIAN ASSISTANT: None   ANESTHESIA:   general  EBL:  2 mL   BLOOD ADMINISTERED:none  DRAINS: none   LOCAL MEDICATIONS USED:  MARCAINE     SPECIMEN:  No Specimen  DISPOSITION OF SPECIMEN:  N/A  COUNTS:  YES  TOURNIQUET:  * No tourniquets in log *  DICTATION: .Other Dictation: Dictation Number [id number]  PLAN OF CARE: Discharge to home after PACU  PATIENT DISPOSITION:  PACU - hemodynamically stable.   Delay start of Pharmacological VTE agent (>24hrs) due to surgical blood loss or risk of bleeding: no

## 2020-05-01 NOTE — Interval H&P Note (Signed)
History and Physical Interval Note:  05/01/2020 7:16 AM  Katelyn Reyes  has presented today for surgery, with the diagnosis of Right knee medial meniscal tear, chondromalacia.  The various methods of treatment have been discussed with the patient and family. After consideration of risks, benefits and other options for treatment, the patient has consented to  Procedure(s) with comments: KNEE ARTHROSCOPY WITH MEDIAL MENISECTOMY, CHONDROPLASTY (Right) - 60 mins as a surgical intervention.  The patient's history has been reviewed, patient examined, no change in status, stable for surgery.  I have reviewed the patient's chart and labs.  Questions were answered to the patient's satisfaction.     [name]

## 2020-05-01 NOTE — Discharge Instructions (Signed)
May remove surgical dressing tomorrow and place Bandaids on the 2 incision sites. May shower just don't soak right leg until sutures out WBAT, crutches to assist with ambulation initially but not required    Do not take any Ibuprofen or NSAIDS before 2:15 pm today.   Post Anesthesia Home Care Instructions  Activity: Get plenty of rest for the remainder of the day. A responsible individual must stay with you for 24 hours following the procedure.  For the next 24 hours, DO NOT: -Drive a car -Paediatric nurse -Drink alcoholic beverages -Take any medication unless instructed by your physician -Make any legal decisions or sign important papers.  Meals: Start with liquid foods such as gelatin or soup. Progress to regular foods as tolerated. Avoid greasy, spicy, heavy foods. If nausea and/or vomiting occur, drink only clear liquids until the nausea and/or vomiting subsides. Call your physician if vomiting continues.  Special Instructions/Symptoms: Your throat may feel dry or sore from the anesthesia or the breathing tube placed in your throat during surgery. If this causes discomfort, gargle with warm salt water. The discomfort should disappear within 24 hours.  If you had a scopolamine patch placed behind your ear for the management of post- operative nausea and/or vomiting:  1. The medication in the patch is effective for 72 hours, after which it should be removed.  Wrap patch in a tissue and discard in the trash. Wash hands thoroughly with soap and water. 2. You may remove the patch earlier than 72 hours if you experience unpleasant side effects which may include dry mouth, dizziness or visual disturbances. 3. Avoid touching the patch. Wash your hands with soap and water after contact with the patch.

## 2020-05-01 NOTE — Anesthesia Preprocedure Evaluation (Signed)
Anesthesia Evaluation  Patient identified by MRN, date of birth, ID band Patient awake    Reviewed: Allergy & Precautions, NPO status , Patient's Chart, lab work & pertinent test results  History of Anesthesia Complications Negative for: history of anesthetic complications  Airway Mallampati: III  TM Distance: >3 FB Neck ROM: Full    Dental  (+) Dental Advisory Given, Teeth Intact   Pulmonary asthma ,  Covid-19 Nucleic Acid Test Results Lab Results      Component                Value               Date                      SARSCOV2NAA              NEGATIVE            04/27/2020              breath sounds clear to auscultation       Cardiovascular (-) hypertension(-) angina(-) CAD and (-) Past MI negative cardio ROS   Rhythm:Regular     Neuro/Psych neg Seizures PSYCHIATRIC DISORDERS Anxiety Depression  Neuromuscular disease    GI/Hepatic GERD  Medicated and Controlled,  Endo/Other  negative endocrine ROS  Renal/GU negative Renal ROS     Musculoskeletal  (+) Arthritis , Right knee medial meniscal tear, chondromalacia   Abdominal   Peds  Hematology negative hematology ROS (+) Lab Results      Component                Value               Date                      WBC                      8.3                 04/19/2020                HGB                      12.4                04/19/2020                HCT                      38.4                04/19/2020                MCV                      97.0                04/19/2020                PLT                      291                 04/19/2020              Anesthesia Other Findings braces  Reproductive/Obstetrics  Anesthesia Physical Anesthesia Plan  ASA: II  Anesthesia Plan: General   Post-op Pain Management:    Induction: Intravenous  PONV Risk Score and Plan: 3 and Ondansetron and  Dexamethasone  Airway Management Planned: LMA  Additional Equipment: None  Intra-op Plan:   Post-operative Plan: Extubation in OR  Informed Consent: I have reviewed the patients History and Physical, chart, labs and discussed the procedure including the risks, benefits and alternatives for the proposed anesthesia with the patient or authorized representative who has indicated his/her understanding and acceptance.     Dental advisory given  Plan Discussed with: CRNA and Surgeon  Anesthesia Plan Comments: (Local by surgeon, discussed adductor block post op if pain poorly controlled. )        Anesthesia Quick Evaluation

## 2020-05-01 NOTE — Op Note (Signed)
NAME: Katelyn, Reyes MEDICAL RECORD [id number] ACCOUNT [id number] DATE OF BIRTH:[birth year] FACILITY: [facility] LOCATION: [facility] PHYSICIAN:Kirtis Challis DAlvan Dame, MD  OPERATIVE REPORT  DATE OF PROCEDURE:  05/01/2020  PREOPERATIVE DIAGNOSIS:  Right knee medial meniscal tear, complex, associated with chondromalacia within the medial and patellofemoral compartments.  POSTOPERATIVE DIAGNOSES/FINDINGS: 1.  Complex tear into the posterior horn to mid body of the medial meniscus. 2.  Grade II to III diffuse chondromalacia medially, no evidence of advanced eburnated bone or grade 4 chondromalacia. 3.  Grade III to IV chondromalacia in the patellofemoral compartment, worse over the lateral trochlea with a small area that was about 0.5 cm x 1 cm in area of grade IV chondromalacia.  PROCEDURES: 1.  Right knee diagnostic and operative arthroscopy with partial medial meniscectomy. 2.  Medial and patellofemoral chondroplasty with debridement, no microfracture or abrasion performed.  SURGEON:  Paralee Cancel, MD  ASSISTANT:  Surgical team.  ANESTHESIA:  General plus postoperative local injection with Marcaine.  SPECIMENS:  None.  COMPLICATIONS:  None.  INDICATIONS FOR PROCEDURE:  The patient is a pleasant 59 year old female who presented to the office for evaluation of right knee pain.  The predominance of her pain was medial.  She failed to respond to conservative measures prior to our evaluation and  had an MRI evaluation of the knee that had revealed medial meniscal tear.  Given the persistence of her symptoms correlating with her exam findings and MRI findings, she wished to proceed with definitive treatment.  At her current age and the  radiographic findings noted, arthroscopic surgery was recommended to address the meniscal pathology.  We discussed the limited risk for infection and DVT.  We discussed the potential for progression of arthritis.  We discussed and reviewed postoperative  course  that will include weight loss and strengthening.  Consent was obtained for benefit of pain relief.  DESCRIPTION OF PROCEDURE:  The patient was brought to the operative theater.  Once adequate anesthesia, preoperative antibiotics, vancomycin administered.  She was positioned supine with the right leg in a leg holder.  The right lower extremity was then  prepped and draped in sterile fashion.  A timeout was performed identifying the patient, planned procedure, and extremity.  Two-incision technique was utilized.  The inferior lateral portal was utilized for the arthroscopic camera.  Diagnostic evaluation  of knee revealed initially evidence of a very large effusion that was drained as well as generalized synovitis.  The inferior medial portal was created for the arthroscopic work.  In the medial compartment, we identified the diffuse grade II to III  chondromalacia without significant chondral defects and definitely no eburnated bone.  There was noted to be a complex tear of the posterior horn to mid body of the medial meniscus.  I used a combination of the straight biting baskets and the shaver to  perform partial meniscectomy, debriding the meniscus back to a stable level, which was back to the peripheral third.  I introduced the shaver to remove meniscal fragments as well as to blend and contour remaining meniscus into the mid body.  Any chondral  flaps that needed to be debrided were addressed.  Final pictures were taken showing a stable meniscal cartilage and no evidence of any further chondral defects articularly.  She was noted to have an intact anterior cruciate ligament.  In the lateral  compartment, there was some degenerative fraying to the central and anterior horn of the meniscus, which I debrided, but did not constitute as a documented and  codable procedure.  Within the anterior compartment, we performed synovectomy from  visualization standpoint.  I used the shaver to perform chondroplasty  predominantly on the apex of the patella and slightly over the lateral facet and then onto the lateral trochlea.  We did identify the chondral defect on this area with stable borders.   The chondral defects around this area were stabilized.  I did not perform abrasion or microfracture in this area.  She had plenty of other articular cartilage surrounding this area.  The knee was re-examined at this point to make certain that I was  satisfied with the remaining stability of the meniscal and articular surfaces.  There was no further debris in the knee.  The instrumentation was then removed from the knee.  Portal sites were reapproximated using 4-0 nylon.  The knee was injected at the  end of the case with 0.5% Marcaine with epinephrine for pain relief.  The knee was then wrapped in a sterile bulky Jones dressing.  She was brought to the recovery room in stable condition tolerating the procedure well.  Findings were reviewed with her  son.  We will see her back in the office in 10-12 days.  Prescription medication sent to her pharmacy.  IN/NUANCE  D:05/01/2020 T:05/01/2020 JOB:[id number]

## 2020-05-01 NOTE — Transfer of Care (Signed)
Immediate Anesthesia Transfer of Care Note  Patient: Katelyn Reyes  Procedure(s) Performed: KNEE ARTHROSCOPY WITH PARTIAL MEDIAL MENISECTOMY,   PATELLA  CHONDROPLASTY (Right Knee)  Patient Location: PACU  Anesthesia Type:General  Level of Consciousness: oriented, drowsy and patient cooperative  Airway & Oxygen Therapy: Patient Spontanous Breathing and Patient connected to nasal cannula oxygen  Post-op Assessment: Report given to RN and Post -op Vital signs reviewed and stable  Post vital signs: Reviewed and stable  Last Vitals:  Vitals Value Taken Time  BP    Temp    Pulse    Resp    SpO2      Last Pain:  Vitals:   05/01/20 0601  TempSrc: Oral  PainSc: 2       Patients Stated Pain Goal: 3 (77/03/40 3524)  Complications: No complications documented.

## 2020-05-01 NOTE — H&P (Signed)
CC- Katelyn Reyes is a 59 y.o. female who presents with right knee pain.  HPI- . Knee Pain: Patient presents for follow up on a knee problem involving the  right knee. Onset of the symptoms was several months ago. Inciting event: none known. Current symptoms include crepitus sensation, giving out, locking, pain located on the inside part of her knee predomionantly and swelling. Pain is aggravated by kneeling, lateral movements, pivoting and walking.  Patient has had prior knee problems. Evaluation to date: plain films: abnormal some medial narrowing, mld osteophytes and MRI: abnormal complex medial meniscus tear, with chondromalacia noted medially and within the patellofemoral compartments. Treatment to date: avoidance of offending activity, corticosteroid injection which was somewhat effective, OTC analgesics which are not very effective, prescription NSAIDS which are not very effective and rest.  Past Medical History:  Diagnosis Date  . Allergic rhinitis   . Anxiety   . Arthritis   . Asthma    related to seasonal allergies  . Breast calcification, right 02/02/2013   Surgically excised 02/17/13 > path benign   . Depression   . GERD (gastroesophageal reflux disease)   . Hyperlipidemia   . IUD 02/07/10    Past Surgical History:  Procedure Laterality Date  . BREAST BIOPSY Right 02/17/2013   Procedure:  NEEDLE LOCALIZATION REMOVAL RIGHT BREAST CALCIFICATIONS;  Surgeon: Haywood Lasso, MD;  Location: [facility];  Service: General;  Laterality: Right;  . BREAST EXCISIONAL BIOPSY Right pt unsure   benign  . BURCH PROCEDURE N/A 12/22/2013   Procedure: BURCH CYSTO URETHROPEXY, ANTERIOR AND POSTERIOR CULPORRHAPHY;  Surgeon: Ena Dawley, MD;  Location: [facility];  Service: Gynecology;  Laterality: N/A;  . CERVICAL DISC ARTHROPLASTY N/A 11/12/2017   Procedure: ARTIFICIAL DISC REPLACEMENT CERVICAL SIX-SEVEN;  Surgeon: Ashok Pall, MD;  Location: [facility];  Service: Neurosurgery;   Laterality: N/A;  ARTIFICIAL DISC REPLACEMENT CERVICAL 6- CERVICAL 7  . CHOLECYSTECTOMY N/A 10/09/2017   Procedure: LAPAROSCOPIC CHOLECYSTECTOMY WITH INTRAOPERATIVE CHOLANGIOGRAM;  Surgeon: Judeth Horn, MD;  Location: [facility];  Service: General;  Laterality: N/A;  . MOUTH SURGERY    . NASAL SINUS SURGERY      Prior to Admission medications   Medication Sig Start Date End Date Taking? Authorizing Provider  ALPRAZolam (XANAX) 0.5 MG tablet TAKE 1 TABLET (0.5 MG TOTAL) BY MOUTH 3 (THREE) TIMES DAILY AS NEEDED. Patient taking differently: Take 0.5 mg by mouth 2 (two) times daily as needed for anxiety.  03/13/20  Yes Midge Minium, MD  cetirizine (ZYRTEC) 10 MG tablet TAKE 1 TABLET (10 MG TOTAL) BY MOUTH DAILY. Patient taking differently: Take 10 mg by mouth every evening.  07/15/19  Yes Midge Minium, MD  Cholecalciferol (VITAMIN D-3) 125 MCG (5000 UT) TABS Take 5,000 Units by mouth every evening.   Yes [provider]  estradiol (ESTRACE) 1 MG tablet Take 1 mg by mouth every evening.  10/08/15  Yes [provider]  fluticasone (FLONASE) 50 MCG/ACT nasal spray Place 2 sprays into both nostrils daily. Patient taking differently: Place 2 sprays into both nostrils every evening.  07/15/19  Yes Midge Minium, MD  montelukast (SINGULAIR) 10 MG tablet TAKE 1 TABLET BY MOUTH DAILY. Patient taking differently: Take 10 mg by mouth every evening.  02/16/20  Yes Midge Minium, MD  pantoprazole (PROTONIX) 40 MG tablet Take one pill twice a day for 2 weeks then one/day after this Patient taking differently: Take 40 mg by mouth at bedtime.  07/15/19  Yes Midge Minium, MD  progesterone (PROMETRIUM) 200 MG capsule Take 200 mg by mouth at bedtime. 03/21/20  Yes [provider]  sertraline (ZOLOFT) 100 MG tablet Take 1 tablet (100 mg total) by mouth daily. Patient taking differently: Take 100 mg by mouth every evening.  07/15/19  Yes Midge Minium, MD  vitamin  B-12 (CYANOCOBALAMIN) 1000 MCG tablet Take 1,000 mcg by mouth every evening.   Yes [provider]  zolpidem (AMBIEN) 10 MG tablet TAKE 1 TABLET (10 MG TOTAL) BY MOUTH AT BEDTIME AS NEEDED FOR SLEEP Patient taking differently: Take 10 mg by mouth at bedtime as needed for sleep.  03/13/20  Yes Midge Minium, MD  albuterol (VENTOLIN HFA) 108 (90 Base) MCG/ACT inhaler INHALE 2 PUFFS INTO THE LUNGS EVERY 6 HOURS AS NEEDED FOR WHEEZING. Patient taking differently: Inhale 2 puffs into the lungs every 6 (six) hours as needed for wheezing.  07/15/19   Midge Minium, MD  azelastine (ASTELIN) 0.1 % nasal spray Place 1 spray into both nostrils 2 (two) times daily. Use in each nostril as directed Patient not taking: Reported on 04/17/2020 11/21/19   Brunetta Jeans, PA-C  ibuprofen (ADVIL) 200 MG tablet Take 800 mg by mouth every 8 (eight) hours as needed (pain.).    [provider]  levonorgestrel (MIRENA) 20 MCG/24HR IUD 1 each by Intrauterine route once.      [provider]  predniSONE (DELTASONE) 10 MG tablet 3 tabs x3 days and then 2 tabs x3 days and then 1 tab x3 days.  Take w/ food. Patient not taking: Reported on 04/17/2020 12/26/19   Midge Minium, MD    soft tissue tenderness over medial joint line, effusion, reduced range of motion, collateral ligaments intact, negative Lachman sign, normal ipsilateral hip exam, normal ipsilateral foot and ankle exam, normal contralateral knee exam  Physical Examination: General appearance - alert, well appearing, and in no distress Mental status - alert, oriented to person, place, and time Chest - no tachypnea, retractions or cyanosis Heart - normal rate and regular rhythm Abdomen - soft, nontender, nondistended, no masses or organomegaly Neurological - alert, oriented, normal speech, no focal findings or movement disorder noted Musculoskeletal - abnormal exam of right knee, see above Extremities - peripheral pulses  normal, no pedal edema, no clubbing or cyanosis Skin - normal coloration and turgor, no rashes, no suspicious skin lesions noted   Asessment/Plan--- Right knee medial meniscal tear- -   Plan right knee arthroscopy with meniscal debridement and chondroplasty medial and patellofemoral. Procedure risks and potential comps discussed with patient who elects to proceed. Goals are decreased pain and increased function with a high likelihood of achieving both

## 2020-05-01 NOTE — Anesthesia Procedure Notes (Signed)
Procedure Name: LMA Insertion Date/Time: 05/01/2020 7:45 AM Performed by: Rogers Blocker, CRNA Pre-anesthesia Checklist: Patient identified, Emergency Drugs available, Suction available and Patient being monitored Patient Re-evaluated:Patient Re-evaluated prior to induction Oxygen Delivery Method: Circle System Utilized Preoxygenation: Pre-oxygenation with 100% oxygen Induction Type: IV induction Ventilation: Mask ventilation without difficulty LMA: LMA inserted LMA Size: 4.0 Number of attempts: 1 Placement Confirmation: positive ETCO2 Tube secured with: Tape Dental Injury: Teeth and Oropharynx as per pre-operative assessment

## 2020-05-02 ENCOUNTER — Encounter (HOSPITAL_BASED_OUTPATIENT_CLINIC_OR_DEPARTMENT_OTHER): Payer: Self-pay | Admitting: Orthopedic Surgery

## 2020-05-04 ENCOUNTER — Other Ambulatory Visit: Payer: Self-pay | Admitting: Family Medicine

## 2020-05-04 MED FILL — SERTRALINE HCL 100 MG TAB: 100 | 90 days supply | Qty: 90 | Fill #0

## 2020-05-06 NOTE — Anesthesia Postprocedure Evaluation (Signed)
Anesthesia Post Note  Patient: Katelyn Reyes  Procedure(s) Performed: KNEE ARTHROSCOPY WITH PARTIAL MEDIAL MENISECTOMY,   PATELLA  CHONDROPLASTY (Right Knee)     Patient location during evaluation: PACU Anesthesia Type: General Level of consciousness: awake and alert Pain management: pain level controlled Vital Signs Assessment: post-procedure vital signs reviewed and stable Respiratory status: spontaneous breathing, nonlabored ventilation, respiratory function stable and patient connected to nasal cannula oxygen Cardiovascular status: blood pressure returned to baseline and stable Postop Assessment: no apparent nausea or vomiting Anesthetic complications: no   No complications documented.  Last Vitals:  Vitals:   05/01/20 0930 05/01/20 1025  BP: 107/65 112/75  Pulse: 72 83  Resp: 18 20  Temp: 37.1 C 36.7 C  SpO2: 98% 94%    Last Pain:  Vitals:   05/02/20 1335  TempSrc:   PainSc: 2                  Kyion Gautier

## 2020-05-09 DIAGNOSIS — M25561 Pain in right knee: Secondary | ICD-10-CM | POA: Diagnosis not present

## 2020-05-16 DIAGNOSIS — M25561 Pain in right knee: Secondary | ICD-10-CM | POA: Diagnosis not present

## 2020-05-22 DIAGNOSIS — M25561 Pain in right knee: Secondary | ICD-10-CM | POA: Diagnosis not present

## 2020-05-24 DIAGNOSIS — M25561 Pain in right knee: Secondary | ICD-10-CM | POA: Diagnosis not present

## 2020-05-29 DIAGNOSIS — M25561 Pain in right knee: Secondary | ICD-10-CM | POA: Diagnosis not present

## 2020-05-31 DIAGNOSIS — M25561 Pain in right knee: Secondary | ICD-10-CM | POA: Diagnosis not present

## 2020-06-05 DIAGNOSIS — M25561 Pain in right knee: Secondary | ICD-10-CM | POA: Diagnosis not present

## 2020-06-07 DIAGNOSIS — M25561 Pain in right knee: Secondary | ICD-10-CM | POA: Diagnosis not present

## 2020-06-13 DIAGNOSIS — M25561 Pain in right knee: Secondary | ICD-10-CM | POA: Diagnosis not present

## 2020-06-18 ENCOUNTER — Other Ambulatory Visit: Payer: Self-pay | Admitting: Gastroenterology

## 2020-06-18 DIAGNOSIS — R1033 Periumbilical pain: Secondary | ICD-10-CM

## 2020-06-25 ENCOUNTER — Other Ambulatory Visit: Payer: 59

## 2020-06-25 ENCOUNTER — Other Ambulatory Visit: Payer: Self-pay

## 2020-06-25 DIAGNOSIS — Z20822 Contact with and (suspected) exposure to covid-19: Secondary | ICD-10-CM | POA: Diagnosis not present

## 2020-06-27 ENCOUNTER — Telehealth: Payer: Self-pay

## 2020-06-27 LAB — NOVEL CORONAVIRUS, NAA: SARS-CoV-2, NAA: DETECTED — AB

## 2020-06-27 LAB — SARS-COV-2, NAA 2 DAY TAT

## 2020-06-27 NOTE — Telephone Encounter (Signed)
Pt. Checking on COVID 19 results, not available yet. °

## 2020-06-29 ENCOUNTER — Other Ambulatory Visit (HOSPITAL_COMMUNITY): Payer: Self-pay | Admitting: Obstetrics and Gynecology

## 2020-06-29 ENCOUNTER — Telehealth: Payer: Self-pay

## 2020-06-29 MED FILL — ESTRADIOL 1 MG TABS: 1 | 90 days supply | Qty: 90 | Fill #0

## 2020-06-29 NOTE — Telephone Encounter (Signed)
Called to discuss with patient about COVID-19 symptoms and the use of one of the available treatments for those with mild to moderate Covid symptoms and at a high risk of hospitalization.  Pt appears to qualify for outpatient treatment due to co-morbid conditions and/or a member of an at-risk group in accordance with the FDA Emergency Use Authorization.    Symptom onset: 06/20/20 Vaccinated: Yes Booster? Yes Immunocompromised? No Qualifiers: Asthma  Unable to reach pt - Reached pt. Out of window and pt. Reports she is feeling better.   Marcello Moores

## 2020-06-30 ENCOUNTER — Other Ambulatory Visit (HOSPITAL_COMMUNITY): Payer: Self-pay | Admitting: Obstetrics and Gynecology

## 2020-07-02 MED FILL — PROGESTERONE MICRONIZED 200: 200 | 90 days supply | Qty: 90 | Fill #0

## 2020-07-10 DIAGNOSIS — M25561 Pain in right knee: Secondary | ICD-10-CM | POA: Diagnosis not present

## 2020-07-17 ENCOUNTER — Other Ambulatory Visit: Payer: Self-pay

## 2020-07-17 ENCOUNTER — Ambulatory Visit (INDEPENDENT_AMBULATORY_CARE_PROVIDER_SITE_OTHER): Payer: 59 | Admitting: Family Medicine

## 2020-07-17 ENCOUNTER — Encounter: Payer: Self-pay | Admitting: Family Medicine

## 2020-07-17 VITALS — BP 124/78 | HR 80 | Temp 97.8°F | Resp 16 | Ht 62.0 in | Wt 200.6 lb

## 2020-07-17 DIAGNOSIS — Z0000 Encounter for general adult medical examination without abnormal findings: Secondary | ICD-10-CM | POA: Diagnosis not present

## 2020-07-17 DIAGNOSIS — Z860100 Personal history of colon polyps, unspecified: Secondary | ICD-10-CM | POA: Insufficient documentation

## 2020-07-17 DIAGNOSIS — R110 Nausea: Secondary | ICD-10-CM | POA: Insufficient documentation

## 2020-07-17 DIAGNOSIS — E559 Vitamin D deficiency, unspecified: Secondary | ICD-10-CM | POA: Diagnosis not present

## 2020-07-17 DIAGNOSIS — K7581 Nonalcoholic steatohepatitis (NASH): Secondary | ICD-10-CM | POA: Insufficient documentation

## 2020-07-17 DIAGNOSIS — R1013 Epigastric pain: Secondary | ICD-10-CM | POA: Insufficient documentation

## 2020-07-17 DIAGNOSIS — R194 Change in bowel habit: Secondary | ICD-10-CM | POA: Insufficient documentation

## 2020-07-17 DIAGNOSIS — K760 Fatty (change of) liver, not elsewhere classified: Secondary | ICD-10-CM | POA: Insufficient documentation

## 2020-07-17 DIAGNOSIS — Z23 Encounter for immunization: Secondary | ICD-10-CM

## 2020-07-17 DIAGNOSIS — Z800 Family history of malignant neoplasm of digestive organs: Secondary | ICD-10-CM | POA: Insufficient documentation

## 2020-07-17 DIAGNOSIS — K5730 Diverticulosis of large intestine without perforation or abscess without bleeding: Secondary | ICD-10-CM | POA: Insufficient documentation

## 2020-07-17 LAB — HEPATIC FUNCTION PANEL
ALT: 19 U/L (ref 0–35)
AST: 19 U/L (ref 0–37)
Albumin: 4.2 g/dL (ref 3.5–5.2)
Alkaline Phosphatase: 69 U/L (ref 39–117)
Bilirubin, Direct: 0 mg/dL (ref 0.0–0.3)
Total Bilirubin: 0.5 mg/dL (ref 0.2–1.2)
Total Protein: 7.5 g/dL (ref 6.0–8.3)

## 2020-07-17 LAB — VITAMIN D 25 HYDROXY (VIT D DEFICIENCY, FRACTURES): VITD: 26.69 ng/mL — ABNORMAL LOW (ref 30.00–100.00)

## 2020-07-17 LAB — CBC WITH DIFFERENTIAL/PLATELET
Basophils Absolute: 0.1 10*3/uL (ref 0.0–0.1)
Basophils Relative: 1.1 % (ref 0.0–3.0)
Eosinophils Absolute: 0.1 10*3/uL (ref 0.0–0.7)
Eosinophils Relative: 2.3 % (ref 0.0–5.0)
HCT: 38.1 % (ref 36.0–46.0)
Hemoglobin: 12.7 g/dL (ref 12.0–15.0)
Lymphocytes Relative: 41.3 % (ref 12.0–46.0)
Lymphs Abs: 2.4 10*3/uL (ref 0.7–4.0)
MCHC: 33.4 g/dL (ref 30.0–36.0)
MCV: 93.2 fl (ref 78.0–100.0)
Monocytes Absolute: 0.5 10*3/uL (ref 0.1–1.0)
Monocytes Relative: 8.1 % (ref 3.0–12.0)
Neutro Abs: 2.7 10*3/uL (ref 1.4–7.7)
Neutrophils Relative %: 47.2 % (ref 43.0–77.0)
Platelets: 310 10*3/uL (ref 150.0–400.0)
RBC: 4.09 Mil/uL (ref 3.87–5.11)
RDW: 12.8 % (ref 11.5–15.5)
WBC: 5.7 10*3/uL (ref 4.0–10.5)

## 2020-07-17 LAB — LIPID PANEL
Cholesterol: 186 mg/dL (ref 0–200)
HDL: 50.9 mg/dL (ref >39.00)
LDL Cholesterol: 107 mg/dL — ABNORMAL HIGH (ref 0–99)
NonHDL: 135.02
Total CHOL/HDL Ratio: 4
Triglycerides: 139 mg/dL (ref 0.0–149.0)
VLDL: 27.8 mg/dL (ref 0.0–40.0)

## 2020-07-17 LAB — BASIC METABOLIC PANEL
BUN: 13 mg/dL (ref 6–23)
CO2: 29 mEq/L (ref 19–32)
Calcium: 9.8 mg/dL (ref 8.4–10.5)
Chloride: 105 mEq/L (ref 96–112)
Creatinine, Ser: 0.89 mg/dL (ref 0.40–1.20)
GFR: 72.12 mL/min (ref >60.00)
Glucose, Bld: 88 mg/dL (ref 70–99)
Potassium: 3.7 mEq/L (ref 3.5–5.1)
Sodium: 139 mEq/L (ref 135–145)

## 2020-07-17 LAB — TSH: TSH: 2.73 u[IU]/mL (ref 0.35–4.50)

## 2020-07-17 MED ORDER — MONTELUKAST SODIUM 10 MG PO TABS
10.0000 mg | ORAL_TABLET | Freq: Every day | ORAL | 1 refills | Status: DC
Start: 2020-07-17 — End: 2020-07-17

## 2020-07-17 MED ORDER — SERTRALINE HCL 100 MG PO TABS
100.0000 mg | ORAL_TABLET | Freq: Every day | ORAL | 1 refills | Status: DC
Start: 2020-07-17 — End: 2020-07-17

## 2020-07-17 MED ORDER — FLUTICASONE PROPIONATE 50 MCG/ACT NA SUSP: 2.0000 | Freq: Every day | NASAL | 6 refills | Status: DC

## 2020-07-17 MED ORDER — ALBUTEROL SULFATE HFA 108 (90 BASE) MCG/ACT IN AERS
INHALATION_SPRAY | RESPIRATORY_TRACT | 6 refills | Status: DC
Start: 2020-07-17 — End: 2020-07-17

## 2020-07-17 MED ORDER — PANTOPRAZOLE SODIUM 40 MG PO TBEC
40.0000 mg | DELAYED_RELEASE_TABLET | Freq: Every day | ORAL | 1 refills | Status: DC
Start: 2020-07-17 — End: 2020-07-17

## 2020-07-17 MED ORDER — ALPRAZOLAM 0.5 MG PO TABS
0.5000 mg | ORAL_TABLET | Freq: Three times a day (TID) | ORAL | 3 refills | Status: DC | PRN
Start: 2020-07-17 — End: 2020-07-17

## 2020-07-17 MED ORDER — CETIRIZINE HCL 10 MG PO TABS
ORAL_TABLET | ORAL | 1 refills | Status: DC
Start: 2020-07-17 — End: 2020-07-17

## 2020-07-17 MED ORDER — ZOLPIDEM TARTRATE 10 MG PO TABS
10.0000 mg | ORAL_TABLET | Freq: Every evening | ORAL | 3 refills | Status: DC | PRN
Start: 2020-07-17 — End: 2020-07-17

## 2020-07-17 MED FILL — ZOLPIDEM TARTRATE 10 MG TAB: 10 | 30 days supply | Qty: 30 | Fill #0

## 2020-07-17 MED FILL — PANTOPRAZOLE SOD DR 40 MG T: 40 | 90 days supply | Qty: 90 | Fill #0

## 2020-07-17 MED FILL — ALBUTEROL SULFATE HFA 108 (: 108 (90 BAS | 25 days supply | Qty: 18 | Fill #0

## 2020-07-17 MED FILL — FLUTICASONE PROP 50 MCG SPR: 50 | 30 days supply | Qty: 16 | Fill #0

## 2020-07-17 MED FILL — SERTRALINE HCL 100 MG TAB: 100 | 90 days supply | Qty: 90 | Fill #0

## 2020-07-17 MED FILL — MONTELUKAST SOD 10 MG TAB: 10 | 90 days supply | Qty: 90 | Fill #0

## 2020-07-17 MED FILL — ALPRAZolam 0.5 MG TABS: 0.5 | 20 days supply | Qty: 60 | Fill #0

## 2020-07-17 NOTE — Patient Instructions (Addendum)
Follow up in 1 year or as needed We'll notify you of your lab results and make any changes if needed Continue to work on healthy diet and regular exercise- you can do it! Schedule a nurse visit for your 2nd shingles shot in 2-6 months Call with any questions or concerns Stay Safe!  Stay Healthy! GOOD LUCK!!!

## 2020-07-17 NOTE — Assessment & Plan Note (Signed)
Pt is down 5 lbs since last visit.  Encouraged her to continue to work on healthy diet and regular exercise.  Check labs to risk stratify.  Will follow.

## 2020-07-17 NOTE — Progress Notes (Signed)
   Subjective:    Patient ID: Katelyn Reyes, female    DOB: 03/31/1961, 60 y.o.   MRN: [id number]  HPI CPE- UTD on pap, mammo, colonoscopy, immunizations  Reviewed past medical, surgical, family and social histories.    Health Maintenance  Topic Date Due  . Hepatitis C Screening  12/25/2020 (Originally 09-05-1960)  . HIV Screening  12/25/2020 (Originally 06/02/1978)  . COLONOSCOPY (Pts 45-42yrs Insurance coverage will need to be confirmed)  09/16/2020  . MAMMOGRAM  12/14/2020  . PAP SMEAR-Modifier  03/23/2023  . TETANUS/TDAP  03/23/2023  . INFLUENZA VACCINE  Completed  . COVID-19 Vaccine  Completed    Patient Care Team    Relationship Specialty Notifications Start End  Midge Minium, MD PCP - General   05/29/10    Comment: Nestor Lewandowsky, MD Consulting Physician Obstetrics and Gynecology  06/06/15   Juanita Craver, MD Consulting Physician Gastroenterology  07/17/20      Review of Systems Patient reports no vision/ hearing changes, adenopathy,fever, weight change,  persistant/recurrent hoarseness , swallowing issues, chest pain, palpitations, edema, persistant/recurrent cough, hemoptysis, dyspnea (rest/exertional/paroxysmal nocturnal), gastrointestinal bleeding (melena, rectal bleeding), abdominal pain, significant heartburn, bowel changes, GU symptoms (dysuria, hematuria, incontinence), Gyn symptoms (abnormal  bleeding, pain),  syncope, focal weakness, memory loss, numbness & tingling, skin/hair/nail changes, abnormal bruising or bleeding, anxiety, or depression.   This visit occurred during the SARS-CoV-2 public health emergency.  Safety protocols were in place, including screening questions prior to the visit, additional usage of staff PPE, and extensive cleaning of exam room while observing appropriate contact time as indicated for disinfecting solutions.       Objective:   Physical Exam General Appearance:    Alert, cooperative, no distress, appears stated age,  obese  Head:    Normocephalic, without obvious abnormality, atraumatic  Eyes:    PERRL, conjunctiva/corneas clear, EOM's intact, fundi    benign, both eyes  Ears:    Normal TM's and external ear canals, both ears  Nose:   Deferred due to COVID  Throat:   Neck:   Supple, symmetrical, trachea midline, no adenopathy;    Thyroid: no enlargement/tenderness/nodules  Back:     Symmetric, no curvature, ROM normal, no CVA tenderness  Lungs:     Clear to auscultation bilaterally, respirations unlabored  Chest Wall:    No tenderness or deformity   Heart:    Regular rate and rhythm, S1 and S2 normal, no murmur, rub   or gallop  Breast Exam:    Deferred to mammo  Abdomen:     Soft, non-tender, bowel sounds active all four quadrants,    no masses, no organomegaly  Genitalia:    Deferred to GYN  Rectal:    Extremities:   Extremities normal, atraumatic, no cyanosis or edema  Pulses:   2+ and symmetric all extremities  Skin:   Skin color, texture, turgor normal, no rashes or lesions  Lymph nodes:   Cervical, supraclavicular, and axillary nodes normal  Neurologic:   CNII-XII intact, normal strength, sensation and reflexes    throughout          Assessment & Plan:

## 2020-07-17 NOTE — Assessment & Plan Note (Signed)
Pt's PE WNL w/ exception of obesity.  UTD on pap, mammo, colonoscopy, immunizations (Shingles shot given today).  Check labs.  Anticipatory guidance provided.

## 2020-07-17 NOTE — Assessment & Plan Note (Signed)
Check labs and replete prn. 

## 2020-07-17 NOTE — Addendum Note (Signed)
Addended by: Midge Minium on: 07/17/2020 08:44 AM   Modules accepted: Orders

## 2020-07-18 ENCOUNTER — Telehealth: Payer: Self-pay | Admitting: Family Medicine

## 2020-07-18 NOTE — Telephone Encounter (Signed)
Patient states she needs a quantiferom blood draw done for school - please advise

## 2020-07-19 ENCOUNTER — Other Ambulatory Visit: Payer: Self-pay

## 2020-07-19 ENCOUNTER — Other Ambulatory Visit: Payer: 59

## 2020-07-19 DIAGNOSIS — E559 Vitamin D deficiency, unspecified: Secondary | ICD-10-CM

## 2020-07-19 DIAGNOSIS — A159 Respiratory tuberculosis unspecified: Secondary | ICD-10-CM

## 2020-07-19 MED ORDER — VITAMIN D (ERGOCALCIFEROL) 1.25 MG (50000 UNIT) PO CAPS: 50000.0000 [IU] | ORAL_CAPSULE | ORAL | 0 refills | Status: DC

## 2020-07-19 MED FILL — VIT D2 1.25 MG (50,000 UNIT: 1.25 MG | 84 days supply | Qty: 12 | Fill #0

## 2020-07-19 NOTE — Telephone Encounter (Signed)
Called and scheduled patient to come in to get quantiferon test per provider

## 2020-07-21 LAB — QUANTIFERON-TB GOLD PLUS
Mitogen-NIL: >10 IU/mL
NIL: 0.08 IU/mL
QuantiFERON-TB Gold Plus: NEGATIVE
TB1-NIL: 0 IU/mL
TB2-NIL: 0.01 IU/mL

## 2020-07-23 NOTE — Progress Notes (Signed)
Results reviewed via mychart

## 2020-09-03 MED FILL — ALPRAZolam 0.5 MG TABS: 0.5 | 20 days supply | Qty: 60 | Fill #0

## 2020-09-03 MED FILL — MONTELUKAST SOD 10 MG TAB: 10 | 90 days supply | Qty: 90 | Fill #0

## 2020-09-03 MED FILL — CETIRIZINE HCL 10 MG TABS: 10 | 90 days supply | Qty: 90 | Fill #0

## 2020-09-03 MED FILL — ALBUTEROL SULFATE HFA 108 (: 108 (90 BAS | 25 days supply | Qty: 18 | Fill #0

## 2020-09-03 MED FILL — FLUTICASONE PROP 50 MCG SPR: 50 | 30 days supply | Qty: 16 | Fill #0

## 2020-09-03 MED FILL — SERTRALINE HCL 100 MG TAB: 100 | 90 days supply | Qty: 90 | Fill #0

## 2020-09-03 MED FILL — PANTOPRAZOLE SOD DR 40 MG T: 40 | 90 days supply | Qty: 90 | Fill #0

## 2020-09-19 ENCOUNTER — Other Ambulatory Visit: Payer: Self-pay

## 2020-09-19 ENCOUNTER — Ambulatory Visit (INDEPENDENT_AMBULATORY_CARE_PROVIDER_SITE_OTHER): Payer: 59 | Admitting: Family Medicine

## 2020-09-19 DIAGNOSIS — Z23 Encounter for immunization: Secondary | ICD-10-CM

## 2020-09-24 ENCOUNTER — Other Ambulatory Visit (HOSPITAL_COMMUNITY): Payer: Self-pay

## 2020-09-24 MED FILL — Alprazolam Tab 0.5 MG: ORAL | 20 days supply | Qty: 60 | Fill #0 | Status: AC

## 2020-09-27 ENCOUNTER — Ambulatory Visit: Payer: 59 | Attending: Internal Medicine

## 2020-09-27 ENCOUNTER — Other Ambulatory Visit: Payer: Self-pay

## 2020-09-27 DIAGNOSIS — Z23 Encounter for immunization: Secondary | ICD-10-CM

## 2020-09-27 NOTE — Progress Notes (Signed)
   Covid-19 Vaccination Clinic  Name:  Katelyn Reyes    MRN: [id number] DOB: [birth year]  09/27/2020  [name] was observed post Covid-19 immunization for 15 minutes without incident. She was provided with Vaccine Information Sheet and instruction to access the V-Safe system.   [name] was instructed to call 911 with any severe reactions post vaccine: Marland Kitchen Difficulty breathing  . Swelling of face and throat  . A fast heartbeat  . A bad rash all over body  . Dizziness and weakness   Immunizations Administered    Name Date Dose VIS Date Route   PFIZER Comrnaty(Gray TOP) Covid-19 Vaccine 09/27/2020  3:45 PM 0.3 mL 05/24/2020 Intramuscular   Manufacturer: [place]   Lot: [id number]   NDC: [phone]

## 2020-10-01 ENCOUNTER — Other Ambulatory Visit (HOSPITAL_BASED_OUTPATIENT_CLINIC_OR_DEPARTMENT_OTHER): Payer: Self-pay

## 2020-10-01 MED ORDER — COVID-19 MRNA VACCINE (PFIZER) 30 MCG/0.3ML IM SUSP
INTRAMUSCULAR | 0 refills | Status: DC
  Filled 2020-10-01: qty 0.3, 1d supply, fill #0

## 2020-10-10 DIAGNOSIS — H52223 Regular astigmatism, bilateral: Secondary | ICD-10-CM | POA: Diagnosis not present

## 2020-10-10 DIAGNOSIS — H524 Presbyopia: Secondary | ICD-10-CM | POA: Diagnosis not present

## 2020-10-10 DIAGNOSIS — H5213 Myopia, bilateral: Secondary | ICD-10-CM | POA: Diagnosis not present

## 2020-10-10 DIAGNOSIS — Z135 Encounter for screening for eye and ear disorders: Secondary | ICD-10-CM | POA: Diagnosis not present

## 2020-10-12 ENCOUNTER — Other Ambulatory Visit (HOSPITAL_BASED_OUTPATIENT_CLINIC_OR_DEPARTMENT_OTHER): Payer: Self-pay

## 2020-11-22 ENCOUNTER — Other Ambulatory Visit (HOSPITAL_COMMUNITY): Payer: Self-pay

## 2020-11-22 MED FILL — Alprazolam Tab 0.5 MG: ORAL | 20 days supply | Qty: 60 | Fill #1 | Status: AC

## 2020-11-27 ENCOUNTER — Other Ambulatory Visit: Payer: Self-pay

## 2020-11-27 ENCOUNTER — Encounter: Payer: Self-pay | Admitting: Family Medicine

## 2020-11-27 ENCOUNTER — Ambulatory Visit: Payer: 59 | Admitting: Family Medicine

## 2020-11-27 ENCOUNTER — Other Ambulatory Visit (HOSPITAL_COMMUNITY): Payer: Self-pay

## 2020-11-27 VITALS — BP 130/72 | HR 78 | Temp 98.1°F | Resp 16 | Ht 62.0 in | Wt 206.6 lb

## 2020-11-27 DIAGNOSIS — R252 Cramp and spasm: Secondary | ICD-10-CM

## 2020-11-27 DIAGNOSIS — E8881 Metabolic syndrome: Secondary | ICD-10-CM | POA: Diagnosis not present

## 2020-11-27 LAB — MAGNESIUM: Magnesium: 2.2 mg/dL (ref 1.5–2.5)

## 2020-11-27 LAB — BASIC METABOLIC PANEL
BUN: 17 mg/dL (ref 6–23)
CO2: 26 mEq/L (ref 19–32)
Calcium: 9.3 mg/dL (ref 8.4–10.5)
Chloride: 105 mEq/L (ref 96–112)
Creatinine, Ser: 1.02 mg/dL (ref 0.40–1.20)
GFR: 61.08 mL/min (ref >60.00)
Glucose, Bld: 91 mg/dL (ref 70–99)
Potassium: 3.8 mEq/L (ref 3.5–5.1)
Sodium: 139 mEq/L (ref 135–145)

## 2020-11-27 MED ORDER — OZEMPIC (0.25 OR 0.5 MG/DOSE) 2 MG/1.5ML ~~LOC~~ SOPN
PEN_INJECTOR | SUBCUTANEOUS | 3 refills | Status: DC
  Filled 2020-11-27: qty 1.5, 42d supply, fill #0
  Filled 2021-01-02: qty 1.5, 28d supply, fill #1

## 2020-11-27 NOTE — Patient Instructions (Signed)
Follow up in 6-8 weeks to recheck weight loss progress We'll notify you of your lab results and make any changes if needed Start the Ozempic weekly- 0.25mg  x4 weeks and then increase to 0.5mg  weekly Continue to work on healthy diet and regular exercise- you can do it! Call with any questions or concerns Stay Safe!  Stay Healthy!

## 2020-11-27 NOTE — Progress Notes (Signed)
   Subjective:    Patient ID: Katelyn Reyes, female    DOB: 05-Jun-1961, 60 y.o.   MRN: [id number]  HPI Obesity- pt has gained 6 lbs since Feb.  BMI is now 37.79.  mom has hx of diabetes.  Pt has hx of elevated sugar.  'i gotta get this weight off'.  Interested in starting [employer].  Pt admits to limited exercise due to busy schedule.  Muscle cramps- sxs started ~6 weeks ago.  Drinking 'at least a half gallon of water/day'.  Cramps will occur diffusely throughout body w/o particular trigger.  Cramps wll last 'minutes'  resolve w/ massage or 'walk it out'.   Review of Systems For ROS see HPI   This visit occurred during the SARS-CoV-2 public health emergency.  Safety protocols were in place, including screening questions prior to the visit, additional usage of staff PPE, and extensive cleaning of exam room while observing appropriate contact time as indicated for disinfecting solutions.      Objective:   Physical Exam Vitals reviewed.  Constitutional:      General: She is not in acute distress.    Appearance: Normal appearance. She is obese. She is not ill-appearing.  HENT:     Head: Normocephalic and atraumatic.  Eyes:     Extraocular Movements: Extraocular movements intact.     Conjunctiva/sclera: Conjunctivae normal.     Pupils: Pupils are equal, round, and reactive to light.  Cardiovascular:     Rate and Rhythm: Normal rate and regular rhythm.     Pulses: Normal pulses.  Pulmonary:     Effort: Pulmonary effort is normal. No respiratory distress.     Breath sounds: Normal breath sounds. No wheezing.  Musculoskeletal:     Cervical back: Normal range of motion and neck supple.  Skin:    General: Skin is warm and dry.  Neurological:     General: No focal deficit present.     Mental Status: She is alert and oriented to person, place, and time.  Psychiatric:        Mood and Affect: Mood normal.        Behavior: Behavior normal.        Thought Content: Thought content normal.           Assessment & Plan:  Metabolic syndrome- new.  pt has central obesity, has hx of elevated sugar, hx of elevated trigs which qualifies for metabolic syndrome.  After discussion on tx options, weight loss products/medications we decided to start Ozempic.  Pt was instructed on proper use, black box warnings, possible side effects.  Will follow closely.  Leg cramps- new.  Encouraged increased hydration.  Check BMP and Mg to r/o metabolic cause

## 2020-11-28 ENCOUNTER — Telehealth: Payer: Self-pay | Admitting: Family Medicine

## 2020-11-28 ENCOUNTER — Other Ambulatory Visit: Payer: Self-pay

## 2020-11-28 ENCOUNTER — Other Ambulatory Visit (HOSPITAL_COMMUNITY): Payer: Self-pay

## 2020-11-28 DIAGNOSIS — E876 Hypokalemia: Secondary | ICD-10-CM

## 2020-11-28 MED ORDER — POTASSIUM CHLORIDE CRYS ER 20 MEQ PO TBCR
20.0000 meq | EXTENDED_RELEASE_TABLET | Freq: Every day | ORAL | 3 refills | Status: DC
  Filled 2020-11-28: qty 30, 30d supply, fill #0

## 2020-11-28 NOTE — Telephone Encounter (Signed)
Pt returned phone call about lab results, she states that she emptied out her vm so if she doesn't answer then you can leave a VM

## 2020-12-12 ENCOUNTER — Encounter: Payer: Self-pay | Admitting: *Deleted

## 2021-01-02 ENCOUNTER — Other Ambulatory Visit (HOSPITAL_COMMUNITY): Payer: Self-pay

## 2021-01-03 ENCOUNTER — Ambulatory Visit: Payer: 59 | Admitting: Family Medicine

## 2021-01-10 ENCOUNTER — Other Ambulatory Visit: Payer: Self-pay

## 2021-01-10 ENCOUNTER — Telehealth (INDEPENDENT_AMBULATORY_CARE_PROVIDER_SITE_OTHER): Payer: 59 | Admitting: Registered Nurse

## 2021-01-10 ENCOUNTER — Other Ambulatory Visit (HOSPITAL_COMMUNITY): Payer: Self-pay

## 2021-01-10 ENCOUNTER — Encounter: Payer: Self-pay | Admitting: Registered Nurse

## 2021-01-10 DIAGNOSIS — E8881 Metabolic syndrome: Secondary | ICD-10-CM

## 2021-01-10 MED ORDER — SEMAGLUTIDE (1 MG/DOSE) 4 MG/3ML ~~LOC~~ SOPN
1.0000 mg | PEN_INJECTOR | SUBCUTANEOUS | 2 refills | Status: DC
  Filled 2021-01-10: qty 3, 28d supply, fill #0
  Filled 2021-02-12: qty 3, 28d supply, fill #1
  Filled 2021-03-19: qty 3, 28d supply, fill #2

## 2021-01-10 NOTE — Patient Instructions (Signed)
° ° ° °  If you have lab work done today you will be contacted with your lab results within the next 2 weeks.  If you have not heard from us then please contact us. The fastest way to get your results is to register for My Chart. ° ° °IF you received an x-ray today, you will receive an invoice from [facility]. Please contact [facility] at [phone] with questions or concerns regarding your invoice.  ° °IF you received labwork today, you will receive an invoice from [facility]. Please contact [facility] at [phone] with questions or concerns regarding your invoice.  ° °Our billing staff will not be able to assist you with questions regarding bills from these companies. ° °You will be contacted with the lab results as soon as they are available. The fastest way to get your results is to activate your My Chart account. Instructions are located on the last page of this paperwork. If you have not heard from us regarding the results in 2 weeks, please contact this office. °  ° ° ° °

## 2021-01-10 NOTE — Progress Notes (Signed)
Telemedicine Encounter- SOAP NOTE Established Patient  This telephone encounter was conducted with the patient's (or proxy's) verbal consent via audio telecommunications: yes/no: Yes Patient was instructed to have this encounter in a suitably private space; and to only have persons present to whom they give permission to participate. In addition, patient identity was confirmed by use of name plus two identifiers (DOB and address).  I discussed the limitations, risks, security and privacy concerns of performing an evaluation and management service by telephone and the availability of in person appointments. I also discussed with the patient that there may be a patient responsible charge related to this service. The patient expressed understanding and agreed to proceed.  I spent a total of 11 minutes talking with the patient or their proxy.  Patient at home Provider in office  Participants: Kathrin Ruddy, NP and [name]  Chief Complaint  Patient presents with   Follow-up    Patient states she would like to follow up on her Ozempic and discuss medication refill.    Subjective   Katelyn Reyes is a 60 y.o. established patient. Telephone visit today for med management  HPI Had been started on Ozempic 0.'5mg'$  weekly by PCP Taking for metabolic syndrome Doing well, no AE, taking as prescribed Wants to increase to '1mg'$  weekly dose.  Otherwise no concerns.   Patient Active Problem List   Diagnosis Date Noted   Change in bowel habit 07/17/2020   Diverticular disease of colon 07/17/2020   Family history of malignant neoplasm of gastrointestinal tract 07/17/2020   Fatty liver 07/17/2020   Morbid obesity ([place]) 07/17/2020   Nausea [id number]   Nonalcoholic steatohepatitis (NASH) 07/17/2020   Personal history of colonic polyps 07/17/2020   Epigastric pain 07/17/2020   Acute medial meniscal tear, right, subsequent encounter 05/01/2020   ETD (Eustachian tube dysfunction),  bilateral 12/27/2019   Obesity (BMI 30-39.9) 07/12/2018   Cervical spondylosis with radiculopathy 11/12/2017   Vitamin D deficiency 07/07/2017   Urinary incontinence in female 12/22/2013   Asthma, mild intermittent 03/22/2013   Vaginal itching 06/01/2012   Thyromegaly 03/18/2012   General medical examination 03/14/2011   CHICKENPOX, HX OF 02/15/2010   Hyperlipidemia 02/14/2009   DEPRESSION 02/14/2009   ALLERGIC RHINITIS 02/14/2009    Past Medical History:  Diagnosis Date   Allergic rhinitis    Anxiety    Arthritis    Asthma    related to seasonal allergies   Breast calcification, right 02/02/2013   Surgically excised 02/17/13 > path benign    Depression    GERD (gastroesophageal reflux disease)    Hyperlipidemia    IUD 02/07/10    Current Outpatient Medications  Medication Sig Dispense Refill   albuterol (VENTOLIN HFA) 108 (90 Base) MCG/ACT inhaler INHALE 2 PUFFS INTO THE LUNGS EVERY 6 HOURS AS NEEDED FOR WHEEZING. 18 g 6   ALPRAZolam (XANAX) 0.5 MG tablet TAKE 1 TABLET (0.5 MG TOTAL) BY MOUTH 3 (THREE) TIMES DAILY AS NEEDED. 60 tablet 3   azelastine (ASTELIN) 0.1 % nasal spray Place 1 spray into both nostrils 2 (two) times daily. Use in each nostril as directed 30 mL 0   cetirizine (ZYRTEC) 10 MG tablet TAKE 1 TABLET (10 MG TOTAL) BY MOUTH DAILY. 90 tablet 1   Cholecalciferol (VITAMIN D-3) 125 MCG (5000 UT) TABS Take 5,000 Units by mouth every evening.     COVID-19 mRNA vaccine, Pfizer, 30 MCG/0.3ML injection Inject into the muscle. 0.3 mL 0   estradiol (ESTRACE) 1  MG tablet Take 1 mg by mouth every evening.   0   estradiol (ESTRACE) 1 MG tablet TAKE 1 TABLET BY MOUTH ONCE DAILY 90 tablet 3   estradiol (ESTRACE) 1 MG tablet TAKE 1 TABLET BY MOUTH ONCE DAILY 90 tablet 3   estradiol (ESTRACE) 1 MG tablet TAKE 1 TABLET BY MOUTH ONCE DAILY 90 tablet 3   fluticasone (FLONASE) 50 MCG/ACT nasal spray PLACE 2 SPRAYS INTO BOTH NOSTRILS DAILY. 16 g 6   levonorgestrel (MIRENA) 20  MCG/24HR IUD 1 each by Intrauterine route once.     meloxicam (MOBIC) 15 MG tablet meloxicam 15 mg tablet  TAKE 1 TABLET(S) BY MOUTH TWICE DAILY FOR 5 DAYS, THEN TAKE ONE TABLET BY MOUTH DAILY AS NEEDED     meloxicam (MOBIC) 15 MG tablet TAKE 1 TABLET BY MOUTH TWICE DAILY FOR 5 DAYS, THEN TAKE ONE TABLET BY MOUTH DAILY AS NEEDED 60 tablet 1   montelukast (SINGULAIR) 10 MG tablet TAKE 1 TABLET (10 MG TOTAL) BY MOUTH DAILY. 90 tablet 1   pantoprazole (PROTONIX) 40 MG tablet TAKE 1 TABLET (40 MG TOTAL) BY MOUTH AT BEDTIME. 90 tablet 1   potassium chloride SA (KLOR-CON) 20 MEQ tablet Take 1 tablet (20 mEq total) by mouth daily. 30 tablet 3   progesterone (PROMETRIUM) 200 MG capsule Take 200 mg by mouth at bedtime.     progesterone (PROMETRIUM) 200 MG capsule TAKE 1 CAPSULE BY MOUTH AT BEDTIME 90 capsule 3   progesterone (PROMETRIUM) 200 MG capsule TAKE 1 CAPSULE BY MOUTH AT BEDTIME 90 capsule 3   Semaglutide, 1 MG/DOSE, 4 MG/3ML SOPN Inject 1 mg as directed once a week. 3 mL 2   sertraline (ZOLOFT) 100 MG tablet TAKE 1 TABLET (100 MG TOTAL) BY MOUTH DAILY. 90 tablet 1   traMADol (ULTRAM) 50 MG tablet tramadol 50 mg tablet  TAKE 1 TABLET BY MOUTH EVERY 6 HOURS     vitamin B-12 (CYANOCOBALAMIN) 1000 MCG tablet Take 1,000 mcg by mouth every evening.     Vitamin D, Ergocalciferol, (DRISDOL) 1.25 MG (50000 UNIT) CAPS capsule TAKE 1 CAPSULE (50,000 UNITS TOTAL) BY MOUTH EVERY 7 DAYS. 12 capsule 0   zolpidem (AMBIEN) 10 MG tablet TAKE 1 TABLET (10 MG TOTAL) BY MOUTH AT BEDTIME AS NEEDED. FOR SLEEP 30 tablet 3   No current facility-administered medications for this visit.    Allergies  Allergen Reactions   Amoxicillin Rash and Other (See Comments)    Has patient had a PCN reaction causing immediate rash, facial/tongue/throat swelling, SOB or lightheadedness with hypotension: No Has patient had a PCN reaction causing severe rash involving mucus membranes or skin necrosis: No Has patient had a PCN  reaction that required treatment: #  #  #  YES  #  #  # - MD office Has patient had a PCN reaction occurring within the last 10 years: Yes If all of the above answers are "NO", then may proceed with Cephalosporin use.    Hydrocodone-Acetaminophen Nausea Only   Erythromycin Nausea Only   Hydrocodone-Acetaminophen Other (See Comments)    Hallucinations   Shrimp [Shellfish Allergy] Itching and Other (See Comments)    REACTS TO SCALLOPS   Sulfa Antibiotics Rash    Social History   Socioeconomic History   Marital status: Single    Spouse name: Not on file   Number of children: Not on file   Years of education: Not on file   Highest education level: Not on file  Occupational History  Not on file  Tobacco Use   Smoking status: Never   Smokeless tobacco: Never  Vaping Use   Vaping Use: Never used  Substance and Sexual Activity   Alcohol use: Yes    Comment: social   Drug use: No   Sexual activity: Yes    Birth control/protection: I.U.D.  Other Topics Concern   Not on file  Social History Narrative   Not on file   Social Determinants of Health   Financial Resource Strain: Not on file  Food Insecurity: Not on file  Transportation Needs: Not on file  Physical Activity: Not on file  Stress: Not on file  Social Connections: Not on file  Intimate Partner Violence: Not on file    Review of Systems  Constitutional: Negative.   HENT: Negative.    Eyes: Negative.   Respiratory: Negative.    Cardiovascular: Negative.   Gastrointestinal: Negative.   Genitourinary: Negative.   Musculoskeletal: Negative.   Skin: Negative.   Neurological: Negative.   Endo/Heme/Allergies: Negative.   Psychiatric/Behavioral: Negative.    All other systems reviewed and are negative.  Objective   Vitals as reported by the patient: There were no vitals filed for this visit.  Maddix was seen today for follow-up.  Diagnoses and all orders for this visit:  Metabolic syndrome -      Semaglutide, 1 MG/DOSE, 4 MG/3ML SOPN; Inject 1 mg as directed once a week.   PLAN Increase dose to '1mg'$  subq weekly. Follow up in office in 2-3 mo Reviewed risks, benefits, and alternatives to this medication. Discussed expected side effects and reasons to contact office Patient encouraged to call clinic with any questions, comments, or concerns.   I discussed the assessment and treatment plan with the patient. The patient was provided an opportunity to ask questions and all were answered. The patient agreed with the plan and demonstrated an understanding of the instructions.   The patient was advised to call back or seek an in-person evaluation if the symptoms worsen or if the condition fails to improve as anticipated.  I provided 11 minutes of non-face-to-face time during this encounter.  Maximiano Coss, NP  Primary Care at [facility]

## 2021-01-16 ENCOUNTER — Ambulatory Visit: Payer: 59 | Admitting: Family Medicine

## 2021-02-12 ENCOUNTER — Other Ambulatory Visit (HOSPITAL_COMMUNITY): Payer: Self-pay

## 2021-02-12 MED FILL — Sertraline HCl Tab 100 MG: ORAL | 90 days supply | Qty: 90 | Fill #0 | Status: AC

## 2021-02-19 ENCOUNTER — Other Ambulatory Visit: Payer: Self-pay | Admitting: Family Medicine

## 2021-02-19 DIAGNOSIS — Z1231 Encounter for screening mammogram for malignant neoplasm of breast: Secondary | ICD-10-CM

## 2021-02-25 ENCOUNTER — Encounter: Payer: Self-pay | Admitting: Family Medicine

## 2021-02-25 ENCOUNTER — Other Ambulatory Visit: Payer: Self-pay

## 2021-02-25 ENCOUNTER — Ambulatory Visit: Payer: 59 | Admitting: Family Medicine

## 2021-02-25 ENCOUNTER — Ambulatory Visit
Admission: RE | Admit: 2021-02-25 | Discharge: 2021-02-25 | Disposition: A | Discharge status: Home / Self Care | Payer: 59 | Source: Ambulatory Visit | Attending: Family Medicine | Admitting: Family Medicine

## 2021-02-25 VITALS — BP 112/78 | HR 79 | Temp 97.5°F | Resp 16 | Ht 62.0 in | Wt 188.0 lb

## 2021-02-25 DIAGNOSIS — E8881 Metabolic syndrome: Secondary | ICD-10-CM | POA: Diagnosis not present

## 2021-02-25 DIAGNOSIS — Z1231 Encounter for screening mammogram for malignant neoplasm of breast: Secondary | ICD-10-CM | POA: Diagnosis not present

## 2021-02-25 DIAGNOSIS — Z23 Encounter for immunization: Secondary | ICD-10-CM | POA: Diagnosis not present

## 2021-02-25 LAB — BASIC METABOLIC PANEL
BUN: 14 mg/dL (ref 6–23)
CO2: 27 mEq/L (ref 19–32)
Calcium: 9.3 mg/dL (ref 8.4–10.5)
Chloride: 103 mEq/L (ref 96–112)
Creatinine, Ser: 0.94 mg/dL (ref 0.40–1.20)
GFR: 67.25 mL/min (ref >60.00)
Glucose, Bld: 89 mg/dL (ref 70–99)
Potassium: 3.9 mEq/L (ref 3.5–5.1)
Sodium: 139 mEq/L (ref 135–145)

## 2021-02-25 NOTE — Patient Instructions (Signed)
Schedule your complete physical for Feb We'll notify you of your lab results and make any changes if needed Keep up the good work!  You look great! Try and add regular exercise to the mix Call with any questions or concerns Happy Fall!!!

## 2021-02-25 NOTE — Assessment & Plan Note (Signed)
Ongoing issue for pt.  At last visit we started Ozempic and she has been able to titrate to '1mg'$ /week.  She is tolerating medication w/o difficulty and has lost 20 lbs!  I encouraged her to add regular exercise to improve her weight loss as she states she would like to lose an additional 40lbs.  Check BMP since medication was started but no anticipated med changes.  Pt expressed understanding and is in agreement w/ plan.

## 2021-02-25 NOTE — Progress Notes (Signed)
   Subjective:    Patient ID: Katelyn Reyes, female    DOB: 05/02/1961, 60 y.o.   MRN: [id number]  HPI Metabolic syndrome- pt started Ozempic at last visit.  Currently on '1mg'$ /week.  She is down 20 lbs in 3 months.  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  No nausea/vomiting.  Some constipation if low water intake.  Denies symptomatic low blood sugars   Review of Systems For ROS see HPI   This visit occurred during the SARS-CoV-2 public health emergency.  Safety protocols were in place, including screening questions prior to the visit, additional usage of staff PPE, and extensive cleaning of exam room while observing appropriate contact time as indicated for disinfecting solutions.      Objective:   Physical Exam Constitutional:      General: She is not in acute distress.    Appearance: She is well-developed.  HENT:     Head: Normocephalic and atraumatic.  Eyes:     Conjunctiva/sclera: Conjunctivae normal.     Pupils: Pupils are equal, round, and reactive to light.  Neck:     Thyroid: No thyromegaly.  Cardiovascular:     Rate and Rhythm: Normal rate and regular rhythm.     Pulses: Normal pulses.     Heart sounds: Normal heart sounds. No murmur heard. Pulmonary:     Effort: Pulmonary effort is normal. No respiratory distress.     Breath sounds: Normal breath sounds.  Abdominal:     General: There is no distension.     Palpations: Abdomen is soft.     Tenderness: There is no abdominal tenderness.  Musculoskeletal:     Cervical back: Normal range of motion and neck supple.     Right lower leg: No edema.     Left lower leg: No edema.  Lymphadenopathy:     Cervical: No cervical adenopathy.  Skin:    General: Skin is warm and dry.  Neurological:     General: No focal deficit present.     Mental Status: She is alert and oriented to person, place, and time.  Psychiatric:        Behavior: Behavior normal.          Assessment & Plan:

## 2021-03-01 ENCOUNTER — Other Ambulatory Visit: Payer: Self-pay | Admitting: Family Medicine

## 2021-03-01 DIAGNOSIS — R928 Other abnormal and inconclusive findings on diagnostic imaging of breast: Secondary | ICD-10-CM

## 2021-03-19 ENCOUNTER — Other Ambulatory Visit: Payer: Self-pay | Admitting: Family Medicine

## 2021-03-19 ENCOUNTER — Other Ambulatory Visit (HOSPITAL_COMMUNITY): Payer: Self-pay

## 2021-03-19 ENCOUNTER — Ambulatory Visit
Admission: RE | Admit: 2021-03-19 | Discharge: 2021-03-19 | Disposition: A | Discharge status: Home / Self Care | Payer: 59 | Source: Ambulatory Visit | Attending: Family Medicine | Admitting: Family Medicine

## 2021-03-19 ENCOUNTER — Other Ambulatory Visit: Payer: Self-pay

## 2021-03-19 DIAGNOSIS — R928 Other abnormal and inconclusive findings on diagnostic imaging of breast: Secondary | ICD-10-CM

## 2021-03-19 DIAGNOSIS — R921 Mammographic calcification found on diagnostic imaging of breast: Secondary | ICD-10-CM | POA: Diagnosis not present

## 2021-03-19 DIAGNOSIS — N6489 Other specified disorders of breast: Secondary | ICD-10-CM | POA: Diagnosis not present

## 2021-03-19 NOTE — Telephone Encounter (Signed)
Didn't see xanax in her med list any more. Please advise

## 2021-03-21 ENCOUNTER — Other Ambulatory Visit (HOSPITAL_COMMUNITY): Payer: Self-pay

## 2021-03-21 MED ORDER — ALPRAZOLAM 0.5 MG PO TABS
0.5000 mg | ORAL_TABLET | Freq: Three times a day (TID) | ORAL | 3 refills | Status: DC | PRN
  Filled 2021-03-21 – 2021-04-01 (×2): qty 60, 20d supply, fill #0
  Filled 2021-06-24: qty 60, 20d supply, fill #1

## 2021-03-26 DIAGNOSIS — Z975 Presence of (intrauterine) contraceptive device: Secondary | ICD-10-CM | POA: Diagnosis not present

## 2021-03-26 DIAGNOSIS — Z6832 Body mass index (BMI) 32.0-32.9, adult: Secondary | ICD-10-CM | POA: Diagnosis not present

## 2021-03-26 DIAGNOSIS — N951 Menopausal and female climacteric states: Secondary | ICD-10-CM | POA: Diagnosis not present

## 2021-03-26 DIAGNOSIS — R87810 Cervical high risk human papillomavirus (HPV) DNA test positive: Secondary | ICD-10-CM | POA: Diagnosis not present

## 2021-03-26 DIAGNOSIS — Z01419 Encounter for gynecological examination (general) (routine) without abnormal findings: Secondary | ICD-10-CM | POA: Diagnosis not present

## 2021-03-26 DIAGNOSIS — Z124 Encounter for screening for malignant neoplasm of cervix: Secondary | ICD-10-CM | POA: Diagnosis not present

## 2021-03-29 ENCOUNTER — Other Ambulatory Visit (HOSPITAL_COMMUNITY): Payer: Self-pay

## 2021-03-29 ENCOUNTER — Other Ambulatory Visit: Payer: 59

## 2021-04-01 ENCOUNTER — Ambulatory Visit
Admission: RE | Admit: 2021-04-01 | Discharge: 2021-04-01 | Disposition: A | Discharge status: Home / Self Care | Payer: 59 | Source: Ambulatory Visit | Attending: Family Medicine | Admitting: Family Medicine

## 2021-04-01 ENCOUNTER — Other Ambulatory Visit (HOSPITAL_COMMUNITY): Payer: Self-pay

## 2021-04-01 ENCOUNTER — Other Ambulatory Visit: Payer: Self-pay

## 2021-04-01 ENCOUNTER — Other Ambulatory Visit: Payer: Self-pay | Admitting: Family Medicine

## 2021-04-01 DIAGNOSIS — R928 Other abnormal and inconclusive findings on diagnostic imaging of breast: Secondary | ICD-10-CM

## 2021-04-01 DIAGNOSIS — C50811 Malignant neoplasm of overlapping sites of right female breast: Secondary | ICD-10-CM | POA: Diagnosis not present

## 2021-04-01 DIAGNOSIS — R921 Mammographic calcification found on diagnostic imaging of breast: Secondary | ICD-10-CM | POA: Diagnosis not present

## 2021-04-01 DIAGNOSIS — N6315 Unspecified lump in the right breast, overlapping quadrants: Secondary | ICD-10-CM | POA: Diagnosis not present

## 2021-04-01 DIAGNOSIS — N62 Hypertrophy of breast: Secondary | ICD-10-CM | POA: Diagnosis not present

## 2021-04-02 ENCOUNTER — Encounter: Payer: Self-pay | Admitting: *Deleted

## 2021-04-02 NOTE — Progress Notes (Signed)
Reached out to [name] to introduce myself as the office RN Navigator and explain our new patient process. Reviewed the reason for their referral and scheduled their new patient appointment along with labs. Provided address and directions to the office including call back phone number. Reviewed with patient any concerns they may have or any possible barriers to attending their appointment.   Informed patient about my role as a navigator and that I will meet with them prior to their New Patient appointment and more fully discuss what services I can provide. At this time patient has no further questions or needs.    Message also sent via [facility].  Oncology Nurse Navigator Documentation  Oncology Nurse Navigator Flowsheets 04/02/2021  Abnormal Finding Date 02/28/2021  Confirmed Diagnosis Date 04/01/2021  Diagnosis Status Confirmed Diagnosis Complete  Navigator Follow Up Date: 04/15/2021  Navigator Follow Up Reason: New Patient Appointment  Navigator Location [facility]  Referral Date to RadOnc/MedOnc 04/02/2021  Navigator Encounter Type Introductory Phone Call  Patient Visit Type MedOnc  Treatment Phase Pre-Tx/Tx Discussion  Barriers/Navigation Needs Coordination of Care;Education  Education Other  Interventions Coordination of Care;Education  Acuity Level 2-Minimal Needs (1-2 Barriers Identified)  Coordination of Care Appts  Education Method Verbal;Written  Time Spent with Patient 48

## 2021-04-05 ENCOUNTER — Other Ambulatory Visit: Payer: Self-pay | Admitting: Surgery

## 2021-04-05 DIAGNOSIS — Z853 Personal history of malignant neoplasm of breast: Secondary | ICD-10-CM

## 2021-04-05 DIAGNOSIS — C50911 Malignant neoplasm of unspecified site of right female breast: Secondary | ICD-10-CM | POA: Diagnosis not present

## 2021-04-09 ENCOUNTER — Telehealth: Payer: Self-pay | Admitting: Genetic Counselor

## 2021-04-09 NOTE — Telephone Encounter (Signed)
Scheduled appointment per 10/25 referral. Patient is aware.

## 2021-04-10 DIAGNOSIS — C50411 Malignant neoplasm of upper-outer quadrant of right female breast: Secondary | ICD-10-CM | POA: Diagnosis not present

## 2021-04-10 DIAGNOSIS — Z170 Estrogen receptor positive status [ER+]: Secondary | ICD-10-CM | POA: Diagnosis not present

## 2021-04-11 ENCOUNTER — Other Ambulatory Visit: Payer: Self-pay

## 2021-04-11 ENCOUNTER — Inpatient Hospital Stay: Payer: 59 | Attending: Internal Medicine | Admitting: Genetic Counselor

## 2021-04-11 ENCOUNTER — Inpatient Hospital Stay: Payer: 59

## 2021-04-11 ENCOUNTER — Other Ambulatory Visit: Payer: Self-pay | Admitting: Genetic Counselor

## 2021-04-11 DIAGNOSIS — Z8041 Family history of malignant neoplasm of ovary: Secondary | ICD-10-CM | POA: Diagnosis not present

## 2021-04-11 DIAGNOSIS — Z170 Estrogen receptor positive status [ER+]: Secondary | ICD-10-CM

## 2021-04-11 DIAGNOSIS — Z803 Family history of malignant neoplasm of breast: Secondary | ICD-10-CM

## 2021-04-11 DIAGNOSIS — C50411 Malignant neoplasm of upper-outer quadrant of right female breast: Secondary | ICD-10-CM

## 2021-04-11 DIAGNOSIS — C50911 Malignant neoplasm of unspecified site of right female breast: Secondary | ICD-10-CM | POA: Insufficient documentation

## 2021-04-11 DIAGNOSIS — Z800 Family history of malignant neoplasm of digestive organs: Secondary | ICD-10-CM

## 2021-04-11 LAB — GENETIC SCREENING ORDER

## 2021-04-12 ENCOUNTER — Encounter: Payer: Self-pay | Admitting: Genetic Counselor

## 2021-04-12 DIAGNOSIS — Z803 Family history of malignant neoplasm of breast: Secondary | ICD-10-CM

## 2021-04-12 DIAGNOSIS — Z8041 Family history of malignant neoplasm of ovary: Secondary | ICD-10-CM

## 2021-04-12 DIAGNOSIS — C50419 Malignant neoplasm of upper-outer quadrant of unspecified female breast: Secondary | ICD-10-CM | POA: Insufficient documentation

## 2021-04-12 DIAGNOSIS — Z800 Family history of malignant neoplasm of digestive organs: Secondary | ICD-10-CM | POA: Insufficient documentation

## 2021-04-12 HISTORY — DX: Family history of malignant neoplasm of breast: Z80.3

## 2021-04-12 HISTORY — DX: Family history of malignant neoplasm of ovary: Z80.41

## 2021-04-12 NOTE — Progress Notes (Signed)
REFERRING PROVIDER: [name], MD [address] [place],  [place] [address]  PRIMARY PROVIDER:  Midge Minium, MD  PRIMARY REASON FOR VISIT:  1. Malignant neoplasm of upper-outer quadrant of right breast in female, estrogen receptor positive ([place])   2. Family history of breast cancer   3. Family history of colon cancer   4. Family history of ovarian cancer     HISTORY OF PRESENT ILLNESS:   Katelyn Reyes, a 60 y.o. female, was seen for a [place] cancer genetics consultation during the breast multidisciplinary clinic at the request of Dr. Ninfa Linden due to a personal and family history of cancer.  Katelyn Reyes presents to clinic today to discuss the possibility of a hereditary predisposition to cancer, to discuss genetic testing, and to further clarify her future cancer risks, as well as potential cancer risks for family members.   In October 2022, at the age of 58, Katelyn Reyes was diagnosed with invasive ductal carcinoma of the right breast (ER+/PR+/HER2-). The treatment plan is pending.   RISK FACTORS:  Menarche was at age 3.  First live birth at age 72.  OCP use for approximately 12 years.  Ovaries intact: yes.  Hysterectomy: no.  HRT use: 2-3 years; stopped recently  Colonoscopy: yes; most recent 2 years ago per pt. Mammogram within the last year: yes.   Past Medical History:  Diagnosis Date   Allergic rhinitis    Anxiety    Arthritis    Asthma    related to seasonal allergies   Breast calcification, right 02/02/2013   Surgically excised 02/17/13 > path benign    Depression    Family history of breast cancer 04/12/2021   Family history of ovarian cancer 04/12/2021   GERD (gastroesophageal reflux disease)    Hyperlipidemia    IUD 02/07/10    Past Surgical History:  Procedure Laterality Date   BREAST BIOPSY Right 02/17/2013   Procedure:  NEEDLE LOCALIZATION REMOVAL RIGHT BREAST CALCIFICATIONS;  Surgeon: Haywood Lasso, MD;  Location: [facility];  Service: General;  Laterality: Right;   BREAST EXCISIONAL BIOPSY Right pt unsure   benign   BURCH PROCEDURE N/A 12/22/2013   Procedure: BURCH CYSTO URETHROPEXY, ANTERIOR AND POSTERIOR CULPORRHAPHY;  Surgeon: Ena Dawley, MD;  Location: [facility];  Service: Gynecology;  Laterality: N/A;   CERVICAL DISC ARTHROPLASTY N/A 11/12/2017   Procedure: ARTIFICIAL DISC REPLACEMENT CERVICAL SIX-SEVEN;  Surgeon: Ashok Pall, MD;  Location: [facility];  Service: Neurosurgery;  Laterality: N/A;  ARTIFICIAL DISC REPLACEMENT CERVICAL 6- CERVICAL 7   CHOLECYSTECTOMY N/A 10/09/2017   Procedure: LAPAROSCOPIC CHOLECYSTECTOMY WITH INTRAOPERATIVE CHOLANGIOGRAM;  Surgeon: Judeth Horn, MD;  Location: [facility];  Service: General;  Laterality: N/A;   KNEE ARTHROSCOPY WITH MEDIAL MENISECTOMY Right 05/01/2020   Procedure: KNEE ARTHROSCOPY WITH PARTIAL MEDIAL MENISECTOMY,   PATELLA  CHONDROPLASTY;  Surgeon: Paralee Cancel, MD;  Location: [facility];  Service: Orthopedics;  Laterality: Right;   MOUTH SURGERY     NASAL SINUS SURGERY      Social History   Socioeconomic History   Marital status: Single    Spouse name: Not on file   Number of children: Not on file   Years of education: Not on file   Highest education level: Not on file  Occupational History   Not on file  Tobacco Use   Smoking status: Never   Smokeless tobacco: Never  Vaping Use   Vaping Use: Never used  Substance and Sexual  Activity   Alcohol use: Yes    Comment: social   Drug use: No   Sexual activity: Yes    Birth control/protection: I.U.D.  Other Topics Concern   Not on file  Social History Narrative   Not on file   Social Determinants of Health   Financial Resource Strain: Not on file  Food Insecurity: Not on file  Transportation Needs: Not on file  Physical Activity: Not on file  Stress: Not on file  Social Connections: Not on file     FAMILY HISTORY:  We obtained a detailed, 4-generation family history.   Significant diagnoses are listed below: Family History  Problem Relation Age of Onset   Breast cancer Maternal Aunt        dx 60s   Colon cancer Paternal Aunt        x2 pat aunts, dx after 25   Cancer Other        ovarian/GYN; MGF's mother   Colon cancer Other        MGM's father; dx after 25    Katelyn Reyes is unaware of previous family history of genetic testing for hereditary cancer risks. There is no reported Ashkenazi Jewish ancestry. There is no known consanguinity.  GENETIC COUNSELING ASSESSMENT: Katelyn Reyes is a 60 y.o. female with a personal and family history of cancer which is somewhat suggestive of a hereditary cancer syndrome and predisposition to cancer given the presence of related cancers in the family. We, therefore, discussed and recommended the following at today's visit.   DISCUSSION: We discussed that 5 - 10% of cancer is hereditary, with most cases of hereditary breast cancer associated with mutations in BRCA1/2.  There are other genes that can be associated with hereditary breast cancer syndromes.  Type of cancer risk and level of risk are gene-specific. We discussed that testing is beneficial for several reasons including knowing how to follow individuals after completing their treatment, identifying whether potential treatment options would be beneficial, and understanding if other family members could be at risk for cancer and allowing them to undergo genetic testing.   We reviewed the characteristics, features and inheritance patterns of hereditary cancer syndromes. We also discussed genetic testing, including the appropriate family members to test, the process of testing, insurance coverage and turn-around-time for results. We discussed the implications of a negative, positive and/or variant of uncertain significant result. In order to get genetic test results in a timely manner so that Katelyn Reyes can use these genetic test results for surgical decisions, we recommended  Katelyn Reyes pursue genetic testing for the [facility] BRCAPlus Panel.  The BRCAplus panel offered by [employer] and includes sequencing and deletion/duplication analysis for the following 8 genes: ATM, BRCA1, BRCA2, CDH1, CHEK2, PALB2, PTEN, and TP53. Once complete, we recommend Katelyn Reyes pursue reflex genetic testing to a more comprehensive gene panel.   Katelyn Reyes  was offered a common hereditary cancer panel (47 genes) and an expanded pan-cancer panel (77 genes). Katelyn Reyes was informed of the benefits and limitations of each panel, including that expanded pan-cancer panels contain genes that do not have clear management guidelines at this point in time.  We also discussed that as the number of genes included on a panel increases, the chances of variants of uncertain significance increases.  After considering the benefits and limitations of each gene panel, Katelyn Reyes  elected to have an expanded pan-cancer panel through [facility].  The CancerNext-Expanded gene panel offered by [facility] and includes sequencing,  rearrangement, and RNA analysis for the following 77 genes: AIP, ALK, APC, ATM, AXIN2, BAP1, BARD1, BLM, BMPR1A, BRCA1, BRCA2, BRIP1, CDC73, CDH1, CDK4, CDKN1B, CDKN2A, CHEK2, CTNNA1, DICER1, FANCC, FH, FLCN, GALNT12, KIF1B, LZTR1, MAX, MEN1, MET, MLH1, MSH2, MSH3, MSH6, MUTYH, NBN, NF1, NF2, NTHL1, PALB2, PHOX2B, PMS2, POT1, PRKAR1A, PTCH1, PTEN, RAD51C, RAD51D, RB1, RECQL, RET, SDHA, SDHAF2, SDHB, SDHC, SDHD, SMAD4, SMARCA4, SMARCB1, SMARCE1, STK11, SUFU, TMEM127, TP53, TSC1, TSC2, VHL and XRCC2 (sequencing and deletion/duplication); EGFR, EGLN1, HOXB13, KIT, MITF, PDGFRA, POLD1, and POLE (sequencing only); EPCAM and GREM1 (deletion/duplication only).   Based on Katelyn Reyes personal and family history of cancer, she meets medical criteria for genetic testing. Despite that she meets criteria, she may still have an out of pocket cost. We discussed that if her out of pocket cost for testing is over  $100, the laboratory should contact them to discuss self-pay prices, patient pay assistance programs, if applicable, and other billing options.   PLAN: After considering the risks, benefits, and limitations, Katelyn Reyes provided informed consent to pursue genetic testing and the blood sample was sent to [facility] for analysis of the BRCAPlus and CancerNext-Expanded +RNAinsight. Results should be available within approximately 1-2 weeks' time, at which point they will be disclosed by telephone to Katelyn Reyes, as will any additional recommendations warranted by these results. Katelyn Reyes will receive a summary of her genetic counseling visit and a copy of her results once available. This information will also be available in Epic.    Lastly, we encouraged Katelyn Reyes to remain in contact with cancer genetics annually so that we can continuously update the family history and inform her of any changes in cancer genetics and testing that may be of benefit for this family.   Katelyn Reyes questions were answered to her satisfaction today. Our contact information was provided should additional questions or concerns arise. Thank you for the referral and allowing [place] to share in the care of your patient.   Marisa Hage M. Joette Catching, [place], [facility] Genetic Counselor [email or web address] (P) [phone]  The patient was seen for a total of 35 minutes in face-to-face genetic counseling.  The patient was seen alone.  Drs. Magrinat, Lindi Adie and/or Burr Medico were available to discuss this case as needed.  _______________________________________________________________________ For Office Staff:  Number of people involved in session: 1 Was an Intern/ student involved with case: no

## 2021-04-15 ENCOUNTER — Inpatient Hospital Stay: Payer: 59

## 2021-04-15 ENCOUNTER — Other Ambulatory Visit: Payer: Self-pay

## 2021-04-15 ENCOUNTER — Encounter: Payer: Self-pay | Admitting: Hematology & Oncology

## 2021-04-15 ENCOUNTER — Inpatient Hospital Stay: Payer: 59 | Admitting: Hematology & Oncology

## 2021-04-15 VITALS — BP 112/80 | HR 57 | Temp 98.2°F | Resp 18 | Ht 62.0 in | Wt 182.0 lb

## 2021-04-15 DIAGNOSIS — Z170 Estrogen receptor positive status [ER+]: Secondary | ICD-10-CM | POA: Diagnosis not present

## 2021-04-15 DIAGNOSIS — Z803 Family history of malignant neoplasm of breast: Secondary | ICD-10-CM | POA: Diagnosis not present

## 2021-04-15 DIAGNOSIS — C50411 Malignant neoplasm of upper-outer quadrant of right female breast: Secondary | ICD-10-CM

## 2021-04-15 DIAGNOSIS — C50911 Malignant neoplasm of unspecified site of right female breast: Secondary | ICD-10-CM | POA: Diagnosis not present

## 2021-04-15 LAB — CMP (CANCER CENTER ONLY)
ALT: 17 U/L (ref 0–44)
AST: 21 U/L (ref 15–41)
Albumin: 4.3 g/dL (ref 3.5–5.0)
Alkaline Phosphatase: 78 U/L (ref 38–126)
Anion gap: 8 (ref 5–15)
BUN: 11 mg/dL (ref 6–20)
CO2: 28 mmol/L (ref 22–32)
Calcium: 10 mg/dL (ref 8.9–10.3)
Chloride: 104 mmol/L (ref 98–111)
Creatinine: 0.94 mg/dL (ref 0.44–1.00)
GFR, Estimated: >60 mL/min (ref >60)
Glucose, Bld: 106 mg/dL — ABNORMAL HIGH (ref 70–99)
Potassium: 3.9 mmol/L (ref 3.5–5.1)
Sodium: 140 mmol/L (ref 135–145)
Total Bilirubin: 0.5 mg/dL (ref 0.3–1.2)
Total Protein: 7.6 g/dL (ref 6.5–8.1)

## 2021-04-15 LAB — CBC WITH DIFFERENTIAL (CANCER CENTER ONLY)
Abs Immature Granulocytes: 0.02 10*3/uL (ref 0.00–0.07)
Basophils Absolute: 0 10*3/uL (ref 0.0–0.1)
Basophils Relative: 1 %
Eosinophils Absolute: 0.2 10*3/uL (ref 0.0–0.5)
Eosinophils Relative: 3 %
HCT: 39.9 % (ref 36.0–46.0)
Hemoglobin: 13 g/dL (ref 12.0–15.0)
Immature Granulocytes: 0 %
Lymphocytes Relative: 53 %
Lymphs Abs: 3.1 10*3/uL (ref 0.7–4.0)
MCH: 30.9 pg (ref 26.0–34.0)
MCHC: 32.6 g/dL (ref 30.0–36.0)
MCV: 94.8 fL (ref 80.0–100.0)
Monocytes Absolute: 0.4 10*3/uL (ref 0.1–1.0)
Monocytes Relative: 7 %
Neutro Abs: 2.1 10*3/uL (ref 1.7–7.7)
Neutrophils Relative %: 36 %
Platelet Count: 328 10*3/uL (ref 150–400)
RBC: 4.21 MIL/uL (ref 3.87–5.11)
RDW: 11.9 % (ref 11.5–15.5)
WBC Count: 5.8 10*3/uL (ref 4.0–10.5)
nRBC: 0 % (ref 0.0–0.2)

## 2021-04-15 LAB — LACTATE DEHYDROGENASE: LDH: 181 U/L (ref 98–192)

## 2021-04-15 NOTE — Progress Notes (Signed)
Referral MD  Reason for Referral: Invasive Ductal Carcinoma of the RIGHT breast-  ER+/PR+/HER2-  Chief Complaint  Patient presents with   Follow-up  : I have breast cancer in the right breast.  HPI: Ms. Katelyn Reyes is a really nice 60 year old African-American female.  She actually is not related to one of our other patients.  She is in training to become a Marine scientist.  She has been fairly healthy.  She has had past surgeries for multiple different issues.  Her gallbladder was taken out.  I think her thyroid has been taken out.  She has had orthopedic surgery..  She had a routine mammogram on 03/19/2021.  This showed a lesion in the right breast that measured 4 x 3 x 4 mm.  She then an ultrasound of this area.  The ultrasound confirmed that this was a solid lesion.  She subsequently underwent a biopsy.  The biopsy was done on 04/01/2021.  The pathology report ([id number]) showed invasive ductal carcinoma.  There is some carcinoma in situ.  It was grade 1 of 3.  It measures 0.4 cm in greatest extent.  Prognostic markers show the tumor to be ER +/PR +/HER2 -.  She is already seen surgery.  She is planning on a double mastectomy with implants.  Apparently this cannot be done until December.  We will see if we cannot try to move this up a little bit more.  She says that she is still perimenopausal.  I think she had a [place] that was done which was 29 which would probably indicate that she was postmenopausal.  There is a history of maternal aunt with breast cancer.  This was probably 20 years ago.  She had been on estrogens.  This is because of hot flashes.  She stopped these when she was found to have breast cancer.  She has had no bony pain.  There is been no problems with nausea and vomiting.  She has had no change in bowel or bladder habits.  There has been no issues with fever.  She has had no problems with COVID although she had COVID I think last year.  She was currently referred to the [facility] to help with management recommendations.  Overall, I would say her performance status is probably ECOG 0.   Past Medical History:  Diagnosis Date   Allergic rhinitis    Anxiety    Arthritis    Asthma    related to seasonal allergies   Breast calcification, right 02/02/2013   Surgically excised 02/17/13 > path benign    Depression    Family history of breast cancer 04/12/2021   Family history of ovarian cancer 04/12/2021   GERD (gastroesophageal reflux disease)    Hyperlipidemia    IUD 02/07/10  :   Past Surgical History:  Procedure Laterality Date   BREAST BIOPSY Right 02/17/2013   Procedure:  NEEDLE LOCALIZATION REMOVAL RIGHT BREAST CALCIFICATIONS;  Surgeon: Haywood Lasso, MD;  Location: [facility];  Service: General;  Laterality: Right;   BREAST EXCISIONAL BIOPSY Right pt unsure   benign   BURCH PROCEDURE N/A 12/22/2013   Procedure: BURCH CYSTO URETHROPEXY, ANTERIOR AND POSTERIOR CULPORRHAPHY;  Surgeon: Ena Dawley, MD;  Location: [facility];  Service: Gynecology;  Laterality: N/A;   CERVICAL DISC ARTHROPLASTY N/A 11/12/2017   Procedure: ARTIFICIAL DISC REPLACEMENT CERVICAL SIX-SEVEN;  Surgeon: Ashok Pall, MD;  Location: [facility];  Service: Neurosurgery;  Laterality: N/A;  ARTIFICIAL DISC REPLACEMENT CERVICAL 6-  CERVICAL 7   CHOLECYSTECTOMY N/A 10/09/2017   Procedure: LAPAROSCOPIC CHOLECYSTECTOMY WITH INTRAOPERATIVE CHOLANGIOGRAM;  Surgeon: Judeth Horn, MD;  Location: [facility];  Service: General;  Laterality: N/A;   KNEE ARTHROSCOPY WITH MEDIAL MENISECTOMY Right 05/01/2020   Procedure: KNEE ARTHROSCOPY WITH PARTIAL MEDIAL MENISECTOMY,   PATELLA  CHONDROPLASTY;  Surgeon: Paralee Cancel, MD;  Location: [facility];  Service: Orthopedics;  Laterality: Right;   MOUTH SURGERY     NASAL SINUS SURGERY    :   Current Outpatient Medications:    albuterol (VENTOLIN HFA) 108 (90 Base) MCG/ACT inhaler, INHALE 2 PUFFS INTO THE LUNGS EVERY 6 HOURS AS  NEEDED FOR WHEEZING., Disp: 18 g, Rfl: 6   ALPRAZolam (XANAX) 0.5 MG tablet, Take 1 tablet (0.5 mg total) by mouth 3 (three) times daily as needed., Disp: 60 tablet, Rfl: 3   cetirizine (ZYRTEC) 10 MG tablet, TAKE 1 TABLET (10 MG TOTAL) BY MOUTH DAILY., Disp: 90 tablet, Rfl: 1   Cholecalciferol (VITAMIN D-3) 125 MCG (5000 UT) TABS, Take 5,000 Units by mouth every evening., Disp: , Rfl:    estradiol (ESTRACE) 1 MG tablet, Take 1 mg by mouth every evening. , Disp: , Rfl: 0   fluticasone (FLONASE) 50 MCG/ACT nasal spray, PLACE 2 SPRAYS INTO BOTH NOSTRILS DAILY., Disp: 16 g, Rfl: 6   levonorgestrel (MIRENA) 20 MCG/24HR IUD, 1 each by Intrauterine route once., Disp: , Rfl:    meloxicam (MOBIC) 15 MG tablet, meloxicam 15 mg tablet  TAKE 1 TABLET(S) BY MOUTH TWICE DAILY FOR 5 DAYS, THEN TAKE ONE TABLET BY MOUTH DAILY AS NEEDED, Disp: , Rfl:    montelukast (SINGULAIR) 10 MG tablet, TAKE 1 TABLET (10 MG TOTAL) BY MOUTH DAILY., Disp: 90 tablet, Rfl: 1   pantoprazole (PROTONIX) 40 MG tablet, TAKE 1 TABLET (40 MG TOTAL) BY MOUTH AT BEDTIME., Disp: 90 tablet, Rfl: 1   progesterone (PROMETRIUM) 200 MG capsule, Take 200 mg by mouth at bedtime., Disp: , Rfl:    Semaglutide, 1 MG/DOSE, 4 MG/3ML SOPN, Inject 1 mg as directed once a week., Disp: 3 mL, Rfl: 2   sertraline (ZOLOFT) 100 MG tablet, TAKE 1 TABLET (100 MG TOTAL) BY MOUTH DAILY., Disp: 90 tablet, Rfl: 1   vitamin B-12 (CYANOCOBALAMIN) 1000 MCG tablet, Take 1,000 mcg by mouth every evening., Disp: , Rfl:    zolpidem (AMBIEN) 10 MG tablet, TAKE 1 TABLET (10 MG TOTAL) BY MOUTH AT BEDTIME AS NEEDED. FOR SLEEP, Disp: 30 tablet, Rfl: 3:  :   Allergies  Allergen Reactions   Amoxicillin Rash and Other (See Comments)    Has patient had a PCN reaction causing immediate rash, facial/tongue/throat swelling, SOB or lightheadedness with hypotension: No Has patient had a PCN reaction causing severe rash involving mucus membranes or skin necrosis: No Has patient had  a PCN reaction that required treatment: #  #  #  YES  #  #  # - MD office Has patient had a PCN reaction occurring within the last 10 years: Yes If all of the above answers are "NO", then may proceed with Cephalosporin use.  Has patient had a PCN reaction causing immediate rash, facial/tongue/throat swelling, SOB or lightheadedness with hypotension: No Has patient had a PCN reaction causing severe rash involving mucus membranes or skin necrosis: No Has patient had a PCN reaction that required treatment: #  #  #  YES  #  #  # - MD office Has patient had a PCN reaction occurring within the  last 10 years: Yes If all of the above answers are "NO", then may proceed with Cephalosporin use. Has patient had a PCN reaction causing immediate rash, facial/tongue/throat swelling, SOB or lightheadedness with hypotension: No Has patient had a PCN reaction causing severe rash involving mucus membranes or skin necrosis: No Has patient had a PCN reaction that required treatment: #  #  #  YES  #  #  # - MD office Has patient had a PCN reaction occurring within the last 10 years: Yes If all of the above answers are "NO", then may proceed with Cephalosporin use.   Erythromycin Nausea Only   Hydrocodone-Acetaminophen Other (See Comments)    Hallucinations   Hydrocodone-Acetaminophen Nausea Only and Other (See Comments)    Hallucinations Hallucinations   Shellfish Allergy Itching and Other (See Comments)    REACTS TO SCALLOPS REACTS TO SCALLOPS   Sulfa Antibiotics Rash  :   Family History  Problem Relation Age of Onset   Fibrocystic breast disease Mother    Hypertension Father    Diabetes Father    Breast cancer Maternal Aunt        dx 42s   Colon cancer Paternal Aunt        x2 pat aunts, dx after 22   Stroke Maternal Grandmother    Cancer Other        ovarian/GYN; MGF's mother   Colon cancer Other        MGM's father; dx after 67   Coronary artery disease Neg Hx   :   Social History    Socioeconomic History   Marital status: Single    Spouse name: Not on file   Number of children: Not on file   Years of education: Not on file   Highest education level: Not on file  Occupational History   Not on file  Tobacco Use   Smoking status: Never   Smokeless tobacco: Never  Vaping Use   Vaping Use: Never used  Substance and Sexual Activity   Alcohol use: Yes    Comment: social   Drug use: No   Sexual activity: Yes    Birth control/protection: I.U.D.  Other Topics Concern   Not on file  Social History Narrative   Not on file   Social Determinants of Health   Financial Resource Strain: Not on file  Food Insecurity: Not on file  Transportation Needs: Not on file  Physical Activity: Not on file  Stress: Not on file  Social Connections: Not on file  Intimate Partner Violence: Not on file  :  Review of Systems  Constitutional: Negative.   HENT: Negative.    Eyes: Negative.   Respiratory: Negative.    Cardiovascular: Negative.   Gastrointestinal: Negative.   Genitourinary: Negative.   Musculoskeletal: Negative.   Skin: Negative.   Neurological: Negative.   Endo/Heme/Allergies: Negative.   Psychiatric/Behavioral: Negative.      Exam: @IPVITALS @ This is a well-developed and well-nourished African-American female in no obvious distress.  Vital signs show temperature of 98.2.  Pulse 57.  Blood pressure 112/80.  Weight is 182 pounds.  Head and neck exam shows no ocular or oral lesions.  She has no palpable cervical or supraclavicular lymph nodes.  Lungs are clear to percussion and auscultation bilaterally.  Cardiac exam regular rate and rhythm with no murmurs, rubs or bruits.  Breast exam shows left breast with no masses, edema or erythema.  There is no left axillary adenopathy.  Right breast shows past  biopsy scars.  She does have some keloid formation with the biopsies.  She has no obvious mass in the right breast.  There is no right nipple discharge.  There is  no right axillary adenopathy.  Abdomen is soft.  She has good bowel sounds.  There is no fluid wave.  There is no palpable liver or spleen tip.  Back exam shows no tenderness over the spine, ribs or hips.  Extremities shows no clubbing, cyanosis or edema.  She has good range of motion of her joints.  Neurological exam shows no focal neurological deficits.  Skin exam shows no rashes, ecchymosis or petechia.   Recent Labs    04/15/21 1020  WBC 5.8  HGB 13.0  HCT 39.9  PLT 328    Recent Labs    04/15/21 1020  NA 140  K 3.9  CL 104  CO2 28  GLUCOSE 106*  BUN 11  CREATININE 0.94  CALCIUM 10.0    Blood smear review: None  Pathology: See above    Assessment and Plan: Ms. Katelyn Reyes is a very charming 60 year old African-American woman.  I do believe she is postmenopausal.  She has a new diagnosis of invasive ductal carcinoma of the right breast.  I have to believe that this is going to be stage I.  I would have to believe that given the very low proliferation index, that she would also have a low Oncotype score when we would send this off.  I am just surprised asking to take 2 months before she has surgery.  We will have to call Dr. Ninfa Linden to try to get this moved up.  I cannot imagine that this cannot be done sooner.  I would like to get an MRI of her right breast.  I think this might help with surgical planning.  Again I would believe that her lymph nodes would be negative.  I cannot find any lymphadenopathy on exam.  I would believe that she is going to need Femara in the adjuvant setting.  I cannot imagine that she would need chemotherapy although with African-American patients, they do tend to have a higher degree of aggressive breast cancer stage for stage.  She says she does not want radiation therapy which is why she is having the double mastectomy.  She does not want to worry about breast cancer coming back.  It was very nice talking with her.  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  I  do think that she probably will have to take off a semester after having her surgery and reconstruction.  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  I will plan to see her back probably about a month after her surgery at which time we would be able to see about getting her on Femara and then we would have the results back from the surgery, and the Oncotype score.

## 2021-04-16 ENCOUNTER — Other Ambulatory Visit (HOSPITAL_COMMUNITY): Payer: Self-pay

## 2021-04-16 ENCOUNTER — Encounter: Payer: Self-pay | Admitting: *Deleted

## 2021-04-16 DIAGNOSIS — C50411 Malignant neoplasm of upper-outer quadrant of right female breast: Secondary | ICD-10-CM | POA: Diagnosis not present

## 2021-04-16 DIAGNOSIS — Z170 Estrogen receptor positive status [ER+]: Secondary | ICD-10-CM | POA: Diagnosis not present

## 2021-04-16 MED ORDER — OXYCODONE HCL 5 MG PO TABS
5.0000 mg | ORAL_TABLET | ORAL | 0 refills | Status: DC | PRN
  Filled 2021-04-16 – 2021-04-25 (×2): qty 20, 4d supply, fill #0

## 2021-04-16 MED ORDER — SULFAMETHOXAZOLE-TRIMETHOPRIM 800-160 MG PO TABS
1.0000 | ORAL_TABLET | Freq: Two times a day (BID) | ORAL | 0 refills | Status: DC
  Filled 2021-04-16 – 2021-04-25 (×2): qty 12, 6d supply, fill #0

## 2021-04-16 MED ORDER — METHOCARBAMOL 500 MG PO TABS
500.0000 mg | ORAL_TABLET | Freq: Three times a day (TID) | ORAL | 0 refills | Status: DC | PRN
  Filled 2021-04-16 – 2021-04-25 (×2): qty 20, 7d supply, fill #0

## 2021-04-16 NOTE — Progress Notes (Signed)
This navigator out of the office for new patient appointment.   Patient diagnosed with early stage breast cancer. She is scheduled for surgery, bilateral mastectomy with implants, on 04/26/2021. She will need an oncotype score sent on pathology. She also needs an MRI which has already been scheduled for 04/19/2021. Dr Marin Olp would like to see the patient approx one month post surgery. Scheduled for 05/28/2021.  Called patient and reviewed follow up appointment details including date, time and location. Patient message also sent via mychart. She is encouraged to call the office with any questions or concerns.   Oncology Nurse Navigator Documentation  Oncology Nurse Navigator Flowsheets 04/16/2021  Abnormal Finding Date -  Confirmed Diagnosis Date -  Diagnosis Status -  Planned Course of Treatment Surgery  Phase of Treatment Surgery  Expected Surgery Date 04/26/2021  Navigator Follow Up Date: 04/26/2021  Navigator Follow Up Reason: Surgery  Navigator Location [facility]  Referral Date to RadOnc/MedOnc -  Navigator Encounter Type Appt/Treatment Plan Review;Telephone  Telephone Appt Confirmation/Clarification;Outgoing Call  Patient Visit Type MedOnc  Treatment Phase Pre-Tx/Tx Discussion  Barriers/Navigation Needs Coordination of Care;Education  Education Other  Interventions Coordination of Care  Acuity Level 2-Minimal Needs (1-2 Barriers Identified)  Coordination of Care Appts  Education Method Verbal  Support Groups/Services Friends and Family  Time Spent with Patient 30

## 2021-04-16 NOTE — H&P (Signed)
Subjective:     Patient ID: Katelyn Reyes is a 60 y.o. female.   HPI    Here for follow up discussion breast reconstruction prior to planned bilateral mastectomies. Presented following screening MMG showing right breast asymmetry. Diagnostic MMG/US showed 0.4 cm mass at 12 o'clock right breast 10 cmfn, axilla normal. Biopsy showed IDC with DCIS, ER/PR+, Her2-. Additional biopsy of right breast upper calcifications benign.   MA with breast ca. Genetics pending. Seen by Dr. Marin Olp who discussed Oncotype and Femara. He also ordered right breast MRI, this has not been scheduled at this time.   Patient has history multiple benign breast biopsies. Patient has elected for bilateral mastectomies. Per patient Dr. Ninfa Linden has counseled against NSM.   Current 38 C happy with this. Wt down over 30 lb over last year. 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 Lives alone. Family in area to assist with post op care. Also in school for FNP.   Review of Systems  Allergic/Immunologic: Positive for environmental allergies and food allergies.    Remainder 12 point review negative    Objective:   Physical Exam Cardiovascular:     Rate and Rhythm: Normal rate and regular rhythm.     Heart sounds: Normal heart sounds.  Pulmonary:     Effort: Pulmonary effort is normal.     Breath sounds: Normal breath sounds.  Abdominal:     Comments: Sufficient tissue for reconstruction  Lymphadenopathy:     Upper Body:     Right upper body: No axillary adenopathy.     Left upper body: No axillary adenopathy.  Skin:    Comments: Fitzpatrick 6     Over sternum hyperpigmented raised scar from pimple per patient, similar appearing scar neck from surgery Low transverse abdominal scar flat faded Breasts: Multiple raised hyperpigmented scars from prior biopsies SN to nipple R 27 L 26. 5 cm BW R 20 L 10 cm CW 16 cm Nipple to IMF R 10 L 10 cm      Assessment:     Right breast cancer    Plan:      Counseled mastectomy surgery and reconstruction could be accompanied by hyperpigmented hypertrophic scars as she is clearly prone to developing them.    Reviewed reconstruction will be asensate and not stimulate. Reviewed risks mastectomy flap necrosis requiring additional surgery. Reviewed anchor type scar.    Discussed use of acellular dermis in reconstruction, cadaveric source, incorporation over several weeks, risk that if has seroma or infection can act as additional nidus for infection if not incorporated.    Discussed prepectoral vs sub pectoral reconstruction. Discussed with patient and benefit of this is no animation deformity, may be less pain. Risk may be more visible rippling over upper poles, greater need of ADM. Reviewed pre pectoral would require larger amount acellular dermis, more drains. Discussed any type reconstruction also risks long term displacement implant and visible rippling. If prepectoral counseled I would recommend she be comfortable with silicone implants as more options that have less rippling. She agrees to prepectoral placement.   Reviewed reconstruction will be asensate and not stimulate. Reviewed additional risks including but not limited to risks mastectomy flap necrosis requiring additional surgery, seroma, hematoma, asymmetry, need to additional procedures, fat necrosis, DVT/PE, damage to adjacent structures, cardiopulmonary complications   Rx for [place] given. Rx for oxycodone robaxin and Bactrim given.

## 2021-04-17 ENCOUNTER — Telehealth: Payer: Self-pay | Admitting: *Deleted

## 2021-04-17 NOTE — Telephone Encounter (Signed)
No 04/15/21 LOS

## 2021-04-18 ENCOUNTER — Encounter (HOSPITAL_BASED_OUTPATIENT_CLINIC_OR_DEPARTMENT_OTHER): Payer: Self-pay | Admitting: Surgery

## 2021-04-18 ENCOUNTER — Other Ambulatory Visit: Payer: Self-pay

## 2021-04-18 NOTE — Progress Notes (Signed)
Pt states she will fax [facility] results to show she is post-menopausal. Currently has IUD but is planning to have this removed d/t these results. No need for pre-surgical pregnancy test per Dr. Sabra Heck.

## 2021-04-19 ENCOUNTER — Ambulatory Visit
Admission: RE | Admit: 2021-04-19 | Discharge: 2021-04-19 | Disposition: A | Discharge status: Home / Self Care | Payer: 59 | Source: Ambulatory Visit | Attending: Hematology & Oncology | Admitting: Hematology & Oncology

## 2021-04-19 ENCOUNTER — Encounter: Payer: Self-pay | Admitting: Genetic Counselor

## 2021-04-19 ENCOUNTER — Telehealth: Payer: Self-pay | Admitting: Genetic Counselor

## 2021-04-19 ENCOUNTER — Encounter: Payer: Self-pay | Admitting: *Deleted

## 2021-04-19 DIAGNOSIS — Z1379 Encounter for other screening for genetic and chromosomal anomalies: Secondary | ICD-10-CM | POA: Insufficient documentation

## 2021-04-19 DIAGNOSIS — C50411 Malignant neoplasm of upper-outer quadrant of right female breast: Secondary | ICD-10-CM

## 2021-04-19 DIAGNOSIS — C50911 Malignant neoplasm of unspecified site of right female breast: Secondary | ICD-10-CM | POA: Diagnosis not present

## 2021-04-19 DIAGNOSIS — C50912 Malignant neoplasm of unspecified site of left female breast: Secondary | ICD-10-CM | POA: Diagnosis not present

## 2021-04-19 MED ORDER — GADOBUTROL 1 MMOL/ML IV SOLN
8.0000 mL | Freq: Once | INTRAVENOUS | Status: AC | PRN
  Administered 2021-04-19: 8 mL via INTRAVENOUS

## 2021-04-19 NOTE — Progress Notes (Signed)
Oncology Nurse Navigator Documentation  Oncology Nurse Navigator Flowsheets 04/19/2021  Abnormal Finding Date -  Confirmed Diagnosis Date -  Diagnosis Status -  Planned Course of Treatment -  Phase of Treatment -  Expected Surgery Date -  Navigator Follow Up Date: 04/26/2021  Navigator Follow Up Reason: Surgery  Navigator Location [facility]  Referral Date to RadOnc/MedOnc -  Navigator Encounter Type Scan Review  Telephone -  Patient Visit Type MedOnc  Treatment Phase Pre-Tx/Tx Discussion  Barriers/Navigation Needs Coordination of Care;Education  Education -  Interventions None Required  Acuity Level 2-Minimal Needs (1-2 Barriers Identified)  Coordination of Care -  Education Method -  Support Groups/Services Friends and Family  Time Spent with Patient 15

## 2021-04-19 NOTE — Telephone Encounter (Signed)
I attempted to contact Ms. Katelyn Reyes to discuss her [facility] genetic testing results (8 genes). Her voicemail box was full. I will send her a MyChart message and attempt to contact her at a later date.   Lucille Passy, MS, [facility] Genetic Counselor [email or web address] (P) [phone]

## 2021-04-22 ENCOUNTER — Encounter: Payer: Self-pay | Admitting: Genetic Counselor

## 2021-04-22 ENCOUNTER — Telehealth: Payer: Self-pay | Admitting: Genetic Counselor

## 2021-04-22 NOTE — Telephone Encounter (Signed)
I contacted [name] to discuss her genetic testing results. No pathogenic variants were identified in the 77 genes analyzed.   The test report has been scanned into EPIC and is located under the Molecular Pathology section of the Results Review tab.  A portion of the result report is included below for reference. Detailed clinic note to follow.  Katelyn Passy, MS, Katelyn Reyes Genetic Counselor [email or web address] (P) [phone]

## 2021-04-23 ENCOUNTER — Ambulatory Visit: Payer: Self-pay | Admitting: Genetic Counselor

## 2021-04-23 DIAGNOSIS — Z800 Family history of malignant neoplasm of digestive organs: Secondary | ICD-10-CM

## 2021-04-23 DIAGNOSIS — Z1379 Encounter for other screening for genetic and chromosomal anomalies: Secondary | ICD-10-CM

## 2021-04-23 DIAGNOSIS — Z8041 Family history of malignant neoplasm of ovary: Secondary | ICD-10-CM

## 2021-04-23 DIAGNOSIS — C50411 Malignant neoplasm of upper-outer quadrant of right female breast: Secondary | ICD-10-CM

## 2021-04-23 DIAGNOSIS — Z803 Family history of malignant neoplasm of breast: Secondary | ICD-10-CM

## 2021-04-23 NOTE — Progress Notes (Signed)
HPI:   Katelyn Reyes was previously seen in the [place] clinic due to a personal and family history of cancer and concerns regarding a hereditary predisposition to cancer. Please refer to our prior cancer genetics clinic note for more information regarding our discussion, assessment and recommendations, at the time. Katelyn Reyes recent genetic test results were disclosed to her, as were recommendations warranted by these results. These results and recommendations are discussed in more detail below.  CANCER HISTORY:  Oncology History  Malignant neoplasm of upper outer quadrant of female breast ([place])  04/12/2021 Initial Diagnosis   Malignant neoplasm of upper outer quadrant of female breast ([place])   04/18/2021 Genetic Testing   [facility] CancerNext-Expanded Panel+RNA is Negative. Report date is 04/20/2021.  The CancerNext-Expanded gene panel offered by [facility] and includes sequencing, rearrangement, and RNA analysis for the following 77 genes: AIP, ALK, APC, ATM, AXIN2, BAP1, BARD1, BLM, BMPR1A, BRCA1, BRCA2, BRIP1, CDC73, CDH1, CDK4, CDKN1B, CDKN2A, CHEK2, CTNNA1, DICER1, FANCC, FH, FLCN, GALNT12, KIF1B, LZTR1, MAX, MEN1, MET, MLH1, MSH2, MSH3, MSH6, MUTYH, NBN, NF1, NF2, NTHL1, PALB2, PHOX2B, PMS2, POT1, PRKAR1A, PTCH1, PTEN, RAD51C, RAD51D, RB1, RECQL, RET, SDHA, SDHAF2, SDHB, SDHC, SDHD, SMAD4, SMARCA4, SMARCB1, SMARCE1, STK11, SUFU, TMEM127, TP53, TSC1, TSC2, VHL and XRCC2 (sequencing and deletion/duplication); EGFR, EGLN1, HOXB13, KIT, MITF, PDGFRA, POLD1, and POLE (sequencing only); EPCAM and GREM1 (deletion/duplication only).      FAMILY HISTORY:  We obtained a detailed, 4-generation family history.  Significant diagnoses are listed below: Family History  Problem Relation Age of Onset   Breast cancer Maternal Aunt        dx 16s   Colon cancer Paternal Aunt        x2 pat aunts, dx after 72   Cancer Other        ovarian/GYN; MGF's mother   Colon cancer Other         MGM's father; dx after 74         Katelyn Reyes is unaware of previous family history of genetic testing for hereditary cancer risks. There is no reported Ashkenazi Jewish ancestry. There is no known consanguinity.   GENETIC TEST RESULTS:  The [facility] CancerNext-Expanded +RNAinsight Panel found no pathogenic mutations. The CancerNext-Expanded gene panel offered by [facility] and includes sequencing, rearrangement, and RNA analysis for the following 77 genes: AIP, ALK, APC, ATM, AXIN2, BAP1, BARD1, BLM, BMPR1A, BRCA1, BRCA2, BRIP1, CDC73, CDH1, CDK4, CDKN1B, CDKN2A, CHEK2, CTNNA1, DICER1, FANCC, FH, FLCN, GALNT12, KIF1B, LZTR1, MAX, MEN1, MET, MLH1, MSH2, MSH3, MSH6, MUTYH, NBN, NF1, NF2, NTHL1, PALB2, PHOX2B, PMS2, POT1, PRKAR1A, PTCH1, PTEN, RAD51C, RAD51D, RB1, RECQL, RET, SDHA, SDHAF2, SDHB, SDHC, SDHD, SMAD4, SMARCA4, SMARCB1, SMARCE1, STK11, SUFU, TMEM127, TP53, TSC1, TSC2, VHL and XRCC2 (sequencing and deletion/duplication); EGFR, EGLN1, HOXB13, KIT, MITF, PDGFRA, POLD1, and POLE (sequencing only); EPCAM and GREM1 (deletion/duplication only).   The test report has been scanned into EPIC and is located under the Molecular Pathology section of the Results Review tab.  A portion of the result report is included below for reference. Genetic testing reported out on April 20, 2021.      Even though a pathogenic variant was not identified, possible explanations for the cancer in the family may include: There may be no hereditary risk for cancer in the family. The cancers in Katelyn Reyes and/or her family may be sporadic/familial or due to other genetic and environmental factors. There may be a gene mutation in one of these genes that current testing methods  cannot detect but that chance is small. There could be another gene that has not yet been discovered, or that we have not yet tested, that is responsible for the cancer diagnoses in the family.  It is also possible there is a hereditary cause for  the cancer in the family that Katelyn Reyes did not inherit.  Therefore, it is important to remain in touch with cancer genetics in the future so that we can continue to offer Katelyn Reyes the most up to date genetic testing.   ADDITIONAL GENETIC TESTING:  We discussed with Katelyn Reyes that her genetic testing was fairly extensive.  If there are other relevant genes identified to increase cancer risk that can be analyzed in the future, we would be happy to discuss and coordinate this testing at that time.    CANCER SCREENING RECOMMENDATIONS:  Katelyn Reyes test result is considered negative (normal).  This means that we have not identified a hereditary cause for her personal history of cancer at this time.   An individual's cancer risk and medical management are not determined by genetic test results alone. Overall cancer risk assessment incorporates additional factors, including personal medical history, family history, and any available genetic information that may result in a personalized plan for cancer prevention and surveillance. Therefore, it is recommended she continue to follow the cancer management and screening guidelines provided by her oncology and primary healthcare provider.  RECOMMENDATIONS FOR FAMILY MEMBERS:   Since she did not inherit a identifiable mutation in a cancer predisposition gene included on this panel, her children could not have inherited a known mutation from her in one of these genes. Individuals in this family might be at some increased risk of developing cancer, over the general population risk, due to the family history of cancer.  Individuals in the family should notify their providers of the family history of cancer. We recommend women in this family have a yearly mammogram beginning at age 35, or 65 years younger than the earliest onset of cancer, an annual clinical breast exam, and perform monthly breast self-exams.  Family members should have colonoscopies by at age  57, or earlier, as recommended by their providers.  Other members of the family may still carry a pathogenic variant in one of these genes that Katelyn Reyes did not inherit. Based on the family history, we recommend her maternal aunt, who was diagnosed with breast cancer in her 69s, have genetic counseling and testing. Katelyn Reyes will let us know if we can be of any assistance in coordinating genetic counseling and/or testing for this family member.    FOLLOW-UP:  Lastly, we discussed with Katelyn Reyes that cancer genetics is a rapidly advancing field and it is possible that new genetic tests will be appropriate for her and/or her family members in the future. We encouraged her to remain in contact with cancer genetics on an annual basis so we can update her personal and family histories and let her know of advances in cancer genetics that may benefit this family.   Our contact number was provided. Katelyn Reyes questions were answered to her satisfaction, and she knows she is welcome to call us at anytime with additional questions or concerns.   June Rode M. Joette Catching, [place], [facility] Genetic Counselor [email or web address] (P) [phone]

## 2021-04-24 ENCOUNTER — Other Ambulatory Visit (HOSPITAL_COMMUNITY): Payer: Self-pay

## 2021-04-25 ENCOUNTER — Other Ambulatory Visit (HOSPITAL_COMMUNITY): Payer: Self-pay

## 2021-04-25 ENCOUNTER — Other Ambulatory Visit: Payer: Self-pay | Admitting: Registered Nurse

## 2021-04-25 DIAGNOSIS — Z9012 Acquired absence of left breast and nipple: Secondary | ICD-10-CM | POA: Diagnosis not present

## 2021-04-25 DIAGNOSIS — E8881 Metabolic syndrome: Secondary | ICD-10-CM

## 2021-04-25 DIAGNOSIS — C50911 Malignant neoplasm of unspecified site of right female breast: Secondary | ICD-10-CM | POA: Diagnosis not present

## 2021-04-25 MED ORDER — OZEMPIC (1 MG/DOSE) 4 MG/3ML ~~LOC~~ SOPN
1.0000 mg | PEN_INJECTOR | SUBCUTANEOUS | 2 refills | Status: DC
  Filled 2021-04-25: qty 3, 28d supply, fill #0
  Filled 2021-06-24: qty 3, 28d supply, fill #1
  Filled 2021-07-29: qty 3, 28d supply, fill #2

## 2021-04-25 NOTE — Progress Notes (Signed)
Provided patient with CHG soap, Ensure presurgery drink. Provided written intsructions for both, along with preop instruction sheet. Confirmed NPO instructions, arrival time.

## 2021-04-25 NOTE — H&P (Signed)
REFERRING PHYSICIAN: Midge Minium, MD  PROVIDER: Beverlee Nims, MD  MRN: [id number] DOB: 11-12-1960  Subjective  Chief Complaint: New Consultation (Breast Cancer )   History of Present Illness: Katelyn Reyes is a 60 y.o. female who is seen t as an office consultation at the request of Dr. Birdie Riddle for evaluation of New Consultation (Breast Cancer ) .   This is a pleasant 60 year old female who presents with a recent diagnosis of a right breast cancer. This was found on recent screening mammography. She has had multiple breast biopsies bilaterally which have all been benign in the past. She has a family history of breast cancer in her maternal aunt in her 65s. She denies nipple discharge. She is otherwise been doing well. The mammogram showed a 0.4 cm mass at 12 o'clock position of the right breast was 10 cm from the nipple. The biopsy showed invasive and in situ ductal carcinoma. It was ER and PR strongly positive with a Ki-67 of 5%. HER2 is pending. She has no cardiopulmonary issues.  Review of Systems: A complete review of systems was obtained from the patient. I have reviewed this information and discussed as appropriate with the patient. See HPI as well for other ROS.  ROS   Medical History: Past Medical History:  Diagnosis Date   Anxiety   Arthritis   Asthma, unspecified asthma severity, unspecified whether complicated, unspecified whether persistent   GERD (gastroesophageal reflux disease)   History of cancer   Hyperlipidemia   Patient Active Problem List  Diagnosis   Acute medial meniscal tear, right, subsequent encounter   Asthma, mild intermittent   Cervical spondylosis with radiculopathy   Change in bowel habit   Depression   Diverticular disease of colon   Epigastric pain   ETD (Eustachian tube dysfunction), bilateral   Family history of malignant neoplasm of gastrointestinal tract   Fatty liver   General medical examination   Goiter    Thyromegaly   Hearing loss   Hyperlipidemia   Metabolic syndrome   Morbid obesity (CMS-HCC)   Nausea   Nonalcoholic steatohepatitis (NA[facility])   Obesity (BMI 30-39.9), unspecified   Personal history of colonic polyps   Rhinitis, chronic   Seasonal allergic rhinitis   Unspecified personal history presenting hazards to health   Urinary incontinence in female   Vaginal itching   Vitamin D deficiency   Past Surgical History:  Procedure Laterality Date   BREAST EXCISIONAL BIOPSY Right 02/17/2013   CERVICAL DISC ARTHROPLASTY; ANTERIOR APPROACH 11/12/2017   CHOLECYSTECTOMY 10/09/2017   COMBINED HYSTERECTOMY ABDOMINAL W/ MMK / BURCH PROCEDURE 12/22/2013   KNEE ARTHROSCOPY Right 05/01/2020   Mouth Surgery   Nasal Sinus Surgery    Allergies  Allergen Reactions   Erythromycin Base Anaphylaxis and Nausea   Amoxicillin Other (See Comments) and Rash  Has patient had a PCN reaction causing immediate rash, facial/tongue/throat swelling, SOB or lightheadedness with hypotension: No Has patient had a PCN reaction causing severe rash involving mucus membranes or skin necrosis: No Has patient had a PCN reaction that required treatment: #  #  #  YES  #  #  # - MD office Has patient had a PCN reaction occurring within the last 10 years: Yes If all of the above answers are "NO", then may proceed with Cephalosporin use. Has patient had a PCN reaction causing immediate rash, facial/tongue/throat swelling, SOB or lightheadedness with hypotension: No Has patient had a PCN reaction causing severe rash involving mucus membranes or skin  necrosis: No Has patient had a PCN reaction that required treatment: #  #  #  YES  #  #  # - MD office Has patient had a PCN reaction occurring within the last 10 years: Yes If all of the above answers are "NO", then may proceed with Cephalosporin use.   Shellfish Derived Itching   Erythromycin Nausea   Hydrocodone-Acetaminophen Nausea and Other (See Comments)   Hallucinations Hallucinations   Shellfish Containing Products Itching and Other (See Comments)  REACTS TO SCALLOPS   Sulfa (Sulfonamide Antibiotics) Rash   Current Outpatient Medications on File Prior to Visit  Medication Sig Dispense Refill   albuterol 90 mcg/actuation inhaler albuterol sulfate HFA 90 mcg/actuation aerosol inhaler   ALPRAZolam (XANAX) 0.5 MG tablet alprazolam 0.5 mg tablet 0.5 mg by oral route.   cetirizine (ZYRTEC) 10 MG tablet cetirizine 10 mg tablet   estradioL (ESTRACE) 0.01 % (0.1 mg/gram) vaginal cream Place vaginally   montelukast (SINGULAIR) 10 mg tablet montelukast 10 mg tablet TAKE 1 TABLET BY MOUTH DAILY.   OZEMPIC 0.25 mg or 0.5 mg(2 mg/1.5 mL) pen injector   pantoprazole (PROTONIX) 40 MG DR tablet pantoprazole 40 mg tablet,delayed release   progesterone (PROMETRIUM) 200 MG capsule progesterone micronized 200 mg capsule   sertraline (ZOLOFT) 100 MG tablet sertraline 100 mg tablet TAKE 1 TABLET (100 MG TOTAL) BY MOUTH DAILY.   zolpidem (AMBIEN) 10 mg tablet zolpidem 10 mg tablet 10 mg by oral route.   No current facility-administered medications on file prior to visit.   History reviewed. No pertinent family history.   Social History   Tobacco Use  Smoking Status Former Smoker   Quit date: 2022   Years since quitting: 0.8  Smokeless Tobacco Never Used    Social History   Socioeconomic History   Marital status: Single  Tobacco Use   Smoking status: Former Smoker  Quit date: 2022  Years since quitting: 0.8   Smokeless tobacco: Never Used  Scientific laboratory technician Use: Never used  Substance and Sexual Activity   Alcohol use: Yes   Drug use: Never   Objective:   Vitals:  04/05/21 1006  BP: 118/80  Pulse: 99  Temp: 36.8 C (98.3 F)  SpO2: 99%  Weight: 83.5 kg (184 lb)  Height: 157.5 cm (_0 )   Body mass index is 33.65 kg/m.  Physical Exam   She appears well on exam  She does have keloids on her breast from previous  biopsies. There are no palpable breast masses. There is no axillary adenopathy  Lungs clear  CV RRR  Abdomen soft, non-tender  Skin without erythema  Neuro grossly intact  Labs, Imaging and Diagnostic Testing: I reviewed her pathology results, ultrasound, and mammograms  Assessment and Plan:  Diagnoses and all orders for this visit:  Breast cancer, stage 1, right (CMS-HCC) - Ambulatory Referral to Genetics - Ambulatory Referral to Plastic Surgery    I reviewed all the notes in the electronic medical records. I gave her and her sister copy the pathology results. She has both invasive and in situ small right ductal carcinoma of the breast. Ultrasound of the axilla was negative. I discussed the diagnosis of breast cancer in detail and we went over the pathology results. We discussed treatment options which include breast conservation versus mastectomy. She has already decided that she wants to proceed with bilateral mastectomies and a right sentinel lymph node biopsy. We discussed the reasonings for lymph node biopsy. I discussed the  surgical procedure in detail we discussed the risks including need for further procedures if the nodes are positive. She will be referred to Dr. Iran Planas at her request to consider immediate reconstruction at the time of mastectomies. She already has an appointment with medical oncology and I will refer her to genetics as well. Surgery will then be scheduled for as soon as possible.

## 2021-04-26 ENCOUNTER — Ambulatory Visit (HOSPITAL_BASED_OUTPATIENT_CLINIC_OR_DEPARTMENT_OTHER): Payer: 59 | Admitting: Anesthesiology

## 2021-04-26 ENCOUNTER — Ambulatory Visit (HOSPITAL_BASED_OUTPATIENT_CLINIC_OR_DEPARTMENT_OTHER)
Admission: RE | Admit: 2021-04-26 | Discharge: 2021-04-27 | Disposition: A | Discharge status: Home / Self Care | Payer: 59 | Source: Ambulatory Visit | Attending: Plastic Surgery | Admitting: Plastic Surgery

## 2021-04-26 ENCOUNTER — Encounter (HOSPITAL_BASED_OUTPATIENT_CLINIC_OR_DEPARTMENT_OTHER)
Admission: RE | Disposition: A | Discharge status: Home / Self Care | Payer: Self-pay | Source: Ambulatory Visit | Attending: Plastic Surgery

## 2021-04-26 ENCOUNTER — Encounter: Payer: Self-pay | Admitting: *Deleted

## 2021-04-26 ENCOUNTER — Other Ambulatory Visit: Payer: Self-pay

## 2021-04-26 ENCOUNTER — Encounter (HOSPITAL_BASED_OUTPATIENT_CLINIC_OR_DEPARTMENT_OTHER): Payer: Self-pay | Admitting: Surgery

## 2021-04-26 DIAGNOSIS — K219 Gastro-esophageal reflux disease without esophagitis: Secondary | ICD-10-CM | POA: Diagnosis not present

## 2021-04-26 DIAGNOSIS — Z170 Estrogen receptor positive status [ER+]: Secondary | ICD-10-CM | POA: Insufficient documentation

## 2021-04-26 DIAGNOSIS — C50911 Malignant neoplasm of unspecified site of right female breast: Secondary | ICD-10-CM | POA: Diagnosis present

## 2021-04-26 DIAGNOSIS — C50111 Malignant neoplasm of central portion of right female breast: Secondary | ICD-10-CM | POA: Diagnosis not present

## 2021-04-26 DIAGNOSIS — R921 Mammographic calcification found on diagnostic imaging of breast: Secondary | ICD-10-CM | POA: Diagnosis not present

## 2021-04-26 DIAGNOSIS — E785 Hyperlipidemia, unspecified: Secondary | ICD-10-CM | POA: Diagnosis not present

## 2021-04-26 DIAGNOSIS — N6012 Diffuse cystic mastopathy of left breast: Secondary | ICD-10-CM | POA: Diagnosis not present

## 2021-04-26 DIAGNOSIS — G8918 Other acute postprocedural pain: Secondary | ICD-10-CM | POA: Diagnosis not present

## 2021-04-26 DIAGNOSIS — C50411 Malignant neoplasm of upper-outer quadrant of right female breast: Secondary | ICD-10-CM | POA: Diagnosis not present

## 2021-04-26 HISTORY — PX: TOTAL MASTECTOMY: SHX6129

## 2021-04-26 HISTORY — PX: MASTECTOMY W/ SENTINEL NODE BIOPSY: SHX2001

## 2021-04-26 HISTORY — PX: BREAST RECONSTRUCTION WITH PLACEMENT OF TISSUE EXPANDER AND ALLODERM: SHX6805

## 2021-04-26 LAB — POCT PREGNANCY, URINE: Preg Test, Ur: NEGATIVE

## 2021-04-26 SURGERY — MASTECTOMY WITH SENTINEL LYMPH NODE BIOPSY
Anesthesia: Regional | Site: Breast | Laterality: Right

## 2021-04-26 MED ORDER — LIDOCAINE HCL (CARDIAC) PF 100 MG/5ML IV SOSY
PREFILLED_SYRINGE | INTRAVENOUS | Status: DC | PRN
  Administered 2021-04-26: 60 mg via INTRAVENOUS

## 2021-04-26 MED ORDER — HYDROMORPHONE HCL 1 MG/ML IJ SOLN: 0.5000 mg | INTRAMUSCULAR | Status: DC | PRN

## 2021-04-26 MED ORDER — CHLORHEXIDINE GLUCONATE CLOTH 2 % EX PADS: 6.0000 | MEDICATED_PAD | Freq: Once | CUTANEOUS | Status: DC

## 2021-04-26 MED ORDER — ENOXAPARIN SODIUM 40 MG/0.4ML IJ SOSY
40.0000 mg | PREFILLED_SYRINGE | INTRAMUSCULAR | Status: DC
  Administered 2021-04-27: 40 mg via SUBCUTANEOUS
  Filled 2021-04-26: qty 0.4

## 2021-04-26 MED ORDER — FENTANYL CITRATE (PF) 100 MCG/2ML IJ SOLN: 25.0000 ug | INTRAMUSCULAR | Status: DC | PRN

## 2021-04-26 MED ORDER — PANTOPRAZOLE SODIUM 40 MG PO TBEC
40.0000 mg | DELAYED_RELEASE_TABLET | Freq: Every day | ORAL | Status: DC
  Administered 2021-04-26: 40 mg via ORAL
  Filled 2021-04-26: qty 1

## 2021-04-26 MED ORDER — ACETAMINOPHEN 10 MG/ML IV SOLN: 1000.0000 mg | Freq: Once | INTRAVENOUS | Status: DC | PRN

## 2021-04-26 MED ORDER — EPHEDRINE SULFATE 50 MG/ML IJ SOLN
INTRAMUSCULAR | Status: DC | PRN
  Administered 2021-04-26 (×3): 10 mg via INTRAVENOUS

## 2021-04-26 MED ORDER — SERTRALINE HCL 100 MG PO TABS
100.0000 mg | ORAL_TABLET | Freq: Every day | ORAL | Status: DC
  Filled 2021-04-26: qty 1

## 2021-04-26 MED ORDER — FENTANYL CITRATE (PF) 100 MCG/2ML IJ SOLN
INTRAMUSCULAR | Status: DC | PRN
  Administered 2021-04-26 (×2): 25 ug via INTRAVENOUS
  Administered 2021-04-26: 100 ug via INTRAVENOUS
  Administered 2021-04-26 (×2): 25 ug via INTRAVENOUS

## 2021-04-26 MED ORDER — FENTANYL CITRATE (PF) 100 MCG/2ML IJ SOLN
INTRAMUSCULAR | Status: AC
  Filled 2021-04-26: qty 2

## 2021-04-26 MED ORDER — MIDAZOLAM HCL 2 MG/2ML IJ SOLN
2.0000 mg | Freq: Once | INTRAMUSCULAR | Status: AC
  Administered 2021-04-26: 2 mg via INTRAVENOUS

## 2021-04-26 MED ORDER — ACETAMINOPHEN 500 MG PO TABS
ORAL_TABLET | ORAL | Status: AC
  Filled 2021-04-26: qty 2

## 2021-04-26 MED ORDER — CEFAZOLIN SODIUM-DEXTROSE 2-4 GM/100ML-% IV SOLN: 2.0000 g | INTRAVENOUS | Status: DC

## 2021-04-26 MED ORDER — FENTANYL CITRATE (PF) 100 MCG/2ML IJ SOLN
100.0000 ug | Freq: Once | INTRAMUSCULAR | Status: AC
  Administered 2021-04-26: 100 ug via INTRAVENOUS

## 2021-04-26 MED ORDER — VANCOMYCIN HCL IN DEXTROSE 1-5 GM/200ML-% IV SOLN
INTRAVENOUS | Status: AC
  Filled 2021-04-26: qty 200

## 2021-04-26 MED ORDER — BUPIVACAINE-EPINEPHRINE (PF) 0.5% -1:200000 IJ SOLN
INTRAMUSCULAR | Status: DC | PRN
  Administered 2021-04-26 (×2): 25 mL via PERINEURAL

## 2021-04-26 MED ORDER — CEFAZOLIN SODIUM-DEXTROSE 2-4 GM/100ML-% IV SOLN
INTRAVENOUS | Status: AC
  Filled 2021-04-26: qty 100

## 2021-04-26 MED ORDER — ALBUTEROL SULFATE HFA 108 (90 BASE) MCG/ACT IN AERS: 1.0000 | INHALATION_SPRAY | RESPIRATORY_TRACT | Status: DC

## 2021-04-26 MED ORDER — BUPIVACAINE HCL (PF) 0.5 % IJ SOLN
INTRAMUSCULAR | Status: AC
  Filled 2021-04-26: qty 30

## 2021-04-26 MED ORDER — MIDAZOLAM HCL 2 MG/2ML IJ SOLN
INTRAMUSCULAR | Status: AC
  Filled 2021-04-26: qty 2

## 2021-04-26 MED ORDER — KETOROLAC TROMETHAMINE 30 MG/ML IJ SOLN
30.0000 mg | Freq: Three times a day (TID) | INTRAMUSCULAR | Status: AC
  Administered 2021-04-26 – 2021-04-27 (×2): 30 mg via INTRAVENOUS
  Filled 2021-04-26 (×2): qty 1

## 2021-04-26 MED ORDER — ONDANSETRON 4 MG PO TBDP: 4.0000 mg | ORAL_TABLET | Freq: Four times a day (QID) | ORAL | Status: DC | PRN

## 2021-04-26 MED ORDER — ONDANSETRON HCL 4 MG/2ML IJ SOLN: 4.0000 mg | Freq: Four times a day (QID) | INTRAMUSCULAR | Status: DC | PRN

## 2021-04-26 MED ORDER — SODIUM CHLORIDE 0.9 % IV SOLN
INTRAVENOUS | Status: AC
  Filled 2021-04-26: qty 10

## 2021-04-26 MED ORDER — METHOCARBAMOL 500 MG PO TABS
500.0000 mg | ORAL_TABLET | Freq: Four times a day (QID) | ORAL | Status: DC | PRN
  Administered 2021-04-26 – 2021-04-27 (×2): 500 mg via ORAL
  Filled 2021-04-26 (×2): qty 1

## 2021-04-26 MED ORDER — PHENYLEPHRINE HCL-NACL 20-0.9 MG/250ML-% IV SOLN
INTRAVENOUS | Status: DC | PRN
  Administered 2021-04-26: 40 ug/min via INTRAVENOUS

## 2021-04-26 MED ORDER — ACETAMINOPHEN 160 MG/5ML PO SOLN: 1000.0000 mg | Freq: Once | ORAL | Status: DC | PRN

## 2021-04-26 MED ORDER — OXYCODONE HCL 5 MG PO TABS
5.0000 mg | ORAL_TABLET | ORAL | Status: DC | PRN
  Administered 2021-04-26 (×2): 5 mg via ORAL
  Filled 2021-04-26: qty 1

## 2021-04-26 MED ORDER — KCL IN DEXTROSE-NACL 20-5-0.45 MEQ/L-%-% IV SOLN
INTRAVENOUS | Status: DC
  Filled 2021-04-26: qty 1000

## 2021-04-26 MED ORDER — OXYCODONE HCL 5 MG PO TABS
5.0000 mg | ORAL_TABLET | Freq: Once | ORAL | Status: DC | PRN
  Filled 2021-04-26: qty 1

## 2021-04-26 MED ORDER — VANCOMYCIN HCL 1000 MG IV SOLR
INTRAVENOUS | Status: DC | PRN
  Administered 2021-04-26: 1000 mg via INTRAVENOUS

## 2021-04-26 MED ORDER — PHENYLEPHRINE HCL (PRESSORS) 10 MG/ML IV SOLN
INTRAVENOUS | Status: AC
  Filled 2021-04-26: qty 1

## 2021-04-26 MED ORDER — PROPOFOL 500 MG/50ML IV EMUL
INTRAVENOUS | Status: DC | PRN
  Administered 2021-04-26: 15 ug/kg/min via INTRAVENOUS

## 2021-04-26 MED ORDER — MAGTRACE LYMPHATIC TRACER
INTRAMUSCULAR | Status: DC | PRN
  Administered 2021-04-26: 2 mL via INTRAMUSCULAR

## 2021-04-26 MED ORDER — ONDANSETRON HCL 4 MG/2ML IJ SOLN
INTRAMUSCULAR | Status: DC | PRN
  Administered 2021-04-26: 4 mg via INTRAVENOUS

## 2021-04-26 MED ORDER — PROPOFOL 10 MG/ML IV BOLUS
INTRAVENOUS | Status: DC | PRN
  Administered 2021-04-26: 160 mg via INTRAVENOUS

## 2021-04-26 MED ORDER — DEXAMETHASONE SODIUM PHOSPHATE 4 MG/ML IJ SOLN
INTRAMUSCULAR | Status: DC | PRN
  Administered 2021-04-26: 10 mg via INTRAVENOUS

## 2021-04-26 MED ORDER — ACETAMINOPHEN 500 MG PO TABS: 1000.0000 mg | ORAL_TABLET | Freq: Once | ORAL | Status: DC | PRN

## 2021-04-26 MED ORDER — ACETAMINOPHEN 500 MG PO TABS
1000.0000 mg | ORAL_TABLET | ORAL | Status: AC
  Administered 2021-04-26: 1000 mg via ORAL

## 2021-04-26 MED ORDER — LACTATED RINGERS IV SOLN: INTRAVENOUS | Status: DC

## 2021-04-26 MED ORDER — LIDOCAINE HCL (PF) 2 % IJ SOLN
INTRAMUSCULAR | Status: DC | PRN
  Administered 2021-04-26: 80 mg via INTRADERMAL

## 2021-04-26 MED ORDER — SUGAMMADEX SODIUM 200 MG/2ML IV SOLN
INTRAVENOUS | Status: DC | PRN
  Administered 2021-04-26: 200 mg via INTRAVENOUS

## 2021-04-26 MED ORDER — ROCURONIUM BROMIDE 100 MG/10ML IV SOLN
INTRAVENOUS | Status: DC | PRN
  Administered 2021-04-26: 20 mg via INTRAVENOUS
  Administered 2021-04-26: 80 mg via INTRAVENOUS

## 2021-04-26 MED ORDER — OXYCODONE HCL 5 MG/5ML PO SOLN: 5.0000 mg | Freq: Once | ORAL | Status: DC | PRN

## 2021-04-26 MED ORDER — LIDOCAINE HCL (PF) 2 % IJ SOLN: INTRAMUSCULAR | Status: DC | PRN

## 2021-04-26 MED ORDER — SODIUM CHLORIDE 0.9 % IV SOLN
INTRAVENOUS | Status: DC | PRN
  Administered 2021-04-26: 500 mL

## 2021-04-26 SURGICAL SUPPLY — 90 items
ALLOGRAFT PERF 16X20 1.6+/-0.4 (Tissue) ×2 IMPLANT
APPLIER CLIP 9.375 MED OPEN (MISCELLANEOUS)
BAG DECANTER FOR FLEXI CONT (MISCELLANEOUS) ×4 IMPLANT
BINDER BREAST LRG (GAUZE/BANDAGES/DRESSINGS) IMPLANT
BINDER BREAST MEDIUM (GAUZE/BANDAGES/DRESSINGS) IMPLANT
BINDER BREAST XLRG (GAUZE/BANDAGES/DRESSINGS) ×1 IMPLANT
BINDER BREAST XXLRG (GAUZE/BANDAGES/DRESSINGS) IMPLANT
BLADE CLIPPER SURG (BLADE) IMPLANT
BLADE SURG 10 STRL SS (BLADE) ×6 IMPLANT
BLADE SURG 15 STRL LF DISP TIS (BLADE) ×3 IMPLANT
BLADE SURG 15 STRL SS (BLADE) ×4
BNDG GAUZE ELAST 4 BULKY (GAUZE/BANDAGES/DRESSINGS) IMPLANT
CANISTER SUCT 1200ML W/VALVE (MISCELLANEOUS) ×4 IMPLANT
CHLORAPREP W/TINT 26 (MISCELLANEOUS) ×5 IMPLANT
CLIP APPLIE 9.375 MED OPEN (MISCELLANEOUS) IMPLANT
COUNTER NEEDLE 1200 MAGNETIC (NEEDLE) ×1 IMPLANT
COVER BACK TABLE 60X90IN (DRAPES) ×4 IMPLANT
COVER MAYO STAND STRL (DRAPES) ×5 IMPLANT
COVER PROBE W GEL 5X96 (DRAPES) ×4 IMPLANT
DECANTER SPIKE VIAL GLASS SM (MISCELLANEOUS) IMPLANT
DERMABOND ADVANCED (GAUZE/BANDAGES/DRESSINGS) ×2
DERMABOND ADVANCED .7 DNX12 (GAUZE/BANDAGES/DRESSINGS) ×3 IMPLANT
DRAIN CHANNEL 15F RND FF W/TCR (WOUND CARE) ×2 IMPLANT
DRAIN CHANNEL 19F RND (DRAIN) ×5 IMPLANT
DRAPE INCISE IOBAN 66X45 STRL (DRAPES) ×1 IMPLANT
DRAPE LAPAROSCOPIC ABDOMINAL (DRAPES) ×3 IMPLANT
DRAPE TOP ARMCOVERS (MISCELLANEOUS) ×4 IMPLANT
DRAPE U-SHAPE 76X120 STRL (DRAPES) ×5 IMPLANT
DRAPE UTILITY XL STRL (DRAPES) ×5 IMPLANT
DRSG PAD ABDOMINAL 8X10 ST (GAUZE/BANDAGES/DRESSINGS) ×8 IMPLANT
DRSG TEGADERM 2-3/8X2-3/4 SM (GAUZE/BANDAGES/DRESSINGS) IMPLANT
DRSG TEGADERM 4X10 (GAUZE/BANDAGES/DRESSINGS) ×2 IMPLANT
ELECT BLADE 4.0 EZ CLEAN MEGAD (MISCELLANEOUS)
ELECT COATED BLADE 2.86 ST (ELECTRODE) ×4 IMPLANT
ELECT REM PT RETURN 9FT ADLT (ELECTROSURGICAL) ×4
ELECTRODE BLDE 4.0 EZ CLN MEGD (MISCELLANEOUS) ×3 IMPLANT
ELECTRODE REM PT RTRN 9FT ADLT (ELECTROSURGICAL) ×3 IMPLANT
EVACUATOR SILICONE 100CC (DRAIN) ×7 IMPLANT
EXPANDER TISSUE FV FOURTE 500 (Prosthesis & Implant Plastic) IMPLANT
GAUZE SPONGE 4X4 12PLY STRL LF (GAUZE/BANDAGES/DRESSINGS) IMPLANT
GLOVE SURG ENC MOIS LTX SZ6.5 (GLOVE) ×1 IMPLANT
GLOVE SURG HYDRASOFT LTX SZ5.5 (GLOVE) ×8 IMPLANT
GLOVE SURG SIGNA 7.5 PF LTX (GLOVE) ×4 IMPLANT
GLOVE SURG UNDER POLY LF SZ6.5 (GLOVE) ×1 IMPLANT
GOWN STRL REUS W/ TWL LRG LVL3 (GOWN DISPOSABLE) ×6 IMPLANT
GOWN STRL REUS W/ TWL XL LVL3 (GOWN DISPOSABLE) ×3 IMPLANT
GOWN STRL REUS W/TWL LRG LVL3 (GOWN DISPOSABLE) ×8
GOWN STRL REUS W/TWL XL LVL3 (GOWN DISPOSABLE) ×4
HEMOSTAT SURGICEL 2X14 (HEMOSTASIS) IMPLANT
KIT FILL SYSTEM UNIVERSAL (SET/KITS/TRAYS/PACK) ×1 IMPLANT
LIGHT WAVEGUIDE WIDE FLAT (MISCELLANEOUS) ×3 IMPLANT
MARKER SKIN DUAL TIP RULER LAB (MISCELLANEOUS) ×1 IMPLANT
NDL HYPO 25X1 1.5 SAFETY (NEEDLE) ×6 IMPLANT
NDL SAFETY ECLIPSE 18X1.5 (NEEDLE) ×3 IMPLANT
NEEDLE HYPO 18GX1.5 SHARP (NEEDLE)
NEEDLE HYPO 25X1 1.5 SAFETY (NEEDLE) ×8 IMPLANT
NS IRRIG 1000ML POUR BTL (IV SOLUTION) ×4 IMPLANT
PACK BASIN DAY SURGERY FS (CUSTOM PROCEDURE TRAY) ×4 IMPLANT
PENCIL SMOKE EVACUATOR (MISCELLANEOUS) ×4 IMPLANT
PIN SAFETY STERILE (MISCELLANEOUS) ×5 IMPLANT
PUNCH BIOPSY DERMAL 4MM (MISCELLANEOUS) IMPLANT
SHEET MEDIUM DRAPE 40X70 STRL (DRAPES) ×4 IMPLANT
SLEEVE SCD COMPRESS KNEE MED (STOCKING) ×4 IMPLANT
SPONGE T-LAP 18X18 ~~LOC~~+RFID (SPONGE) ×10 IMPLANT
STAPLER VISISTAT 35W (STAPLE) ×4 IMPLANT
SUT CHROMIC 4 0 PS 2 18 (SUTURE) ×6 IMPLANT
SUT ETHILON 2 0 FS 18 (SUTURE) ×5 IMPLANT
SUT ETHILON 3 0 PS 1 (SUTURE) ×4 IMPLANT
SUT MNCRL AB 4-0 PS2 18 (SUTURE) ×7 IMPLANT
SUT PDS AB 2-0 CT2 27 (SUTURE) IMPLANT
SUT SILK 2 0 SH (SUTURE) ×1 IMPLANT
SUT VIC AB 3-0 SH 27 (SUTURE) ×12
SUT VIC AB 3-0 SH 27X BRD (SUTURE) ×3 IMPLANT
SUT VICRYL 0 CT-2 (SUTURE) ×4 IMPLANT
SUT VICRYL 4-0 PS2 18IN ABS (SUTURE) ×2 IMPLANT
SUT VLOC 180 0 24IN GS25 (SUTURE) ×2 IMPLANT
SYR 10ML LL (SYRINGE) ×3 IMPLANT
SYR 50ML LL SCALE MARK (SYRINGE) ×4 IMPLANT
SYR BULB EAR ULCER 3OZ GRN STR (SYRINGE) ×3 IMPLANT
SYR BULB IRRIG 60ML STRL (SYRINGE) ×4 IMPLANT
SYR CONTROL 10ML LL (SYRINGE) ×8 IMPLANT
TAPE MEASURE VINYL STERILE (MISCELLANEOUS) IMPLANT
TISSUE EXPNDR FV FOURTE 500 (Prosthesis & Implant Plastic) ×8 IMPLANT
TOWEL GREEN STERILE FF (TOWEL DISPOSABLE) ×4 IMPLANT
TRACER MAGTRACE VIAL (MISCELLANEOUS) ×1 IMPLANT
TRAY DSU PREP LF (CUSTOM PROCEDURE TRAY) IMPLANT
TRAY FOLEY W/BAG SLVR 14FR LF (SET/KITS/TRAYS/PACK) ×1 IMPLANT
TUBE CONNECTING 20X1/4 (TUBING) ×4 IMPLANT
UNDERPAD 30X36 HEAVY ABSORB (UNDERPADS AND DIAPERS) ×8 IMPLANT
YANKAUER SUCT BULB TIP NO VENT (SUCTIONS) ×4 IMPLANT

## 2021-04-26 NOTE — Anesthesia Preprocedure Evaluation (Signed)
Anesthesia Evaluation  Patient identified by MRN, date of birth, ID band Patient awake    Reviewed: Allergy & Precautions, NPO status , Patient's Chart, lab work & pertinent test results  History of Anesthesia Complications Negative for: history of anesthetic complications  Airway Mallampati: II  TM Distance: >3 FB Neck ROM: Full    Dental  (+) Dental Advisory Given, Teeth Intact   Pulmonary neg shortness of breath, asthma , neg sleep apnea, neg COPD, neg recent URI,    breath sounds clear to auscultation       Cardiovascular negative cardio ROS   Rhythm:Regular     Neuro/Psych PSYCHIATRIC DISORDERS Anxiety Depression  Neuromuscular disease    GI/Hepatic GERD  ,(+) Hepatitis -  Endo/Other  negative endocrine ROS  Renal/GU negative Renal ROS     Musculoskeletal  (+) Arthritis ,   Abdominal   Peds  Hematology negative hematology ROS (+) Lab Results      Component                Value               Date                      WBC                      5.8                 04/15/2021                HGB                      13.0                04/15/2021                HCT                      39.9                04/15/2021                MCV                      94.8                04/15/2021                PLT                      328                 04/15/2021              Anesthesia Other Findings   Reproductive/Obstetrics Lab Results      Component                Value               Date                      PREGTESTUR               NEGATIVE            04/26/2021  Anesthesia Physical Anesthesia Plan  ASA: 2  Anesthesia Plan: General and Regional   Post-op Pain Management:  Regional for Post-op pain   Induction: Intravenous  PONV Risk Score and Plan: 3 and Ondansetron, Dexamethasone, Propofol infusion and Midazolam  Airway Management Planned:  Oral ETT  Additional Equipment: None  Intra-op Plan:   Post-operative Plan: Extubation in OR  Informed Consent: I have reviewed the patients History and Physical, chart, labs and discussed the procedure including the risks, benefits and alternatives for the proposed anesthesia with the patient or authorized representative who has indicated his/her understanding and acceptance.     Dental advisory given  Plan Discussed with: CRNA and Anesthesiologist  Anesthesia Plan Comments:         Anesthesia Quick Evaluation

## 2021-04-26 NOTE — Interval H&P Note (Signed)
History and Physical Interval Note:  04/26/2021 11:39 AM  [name]  has presented today for surgery, with the diagnosis of RIGHT BREAST CANCER.  The various methods of treatment have been discussed with the patient and family. After consideration of risks, benefits and other options for treatment, the patient has consented to  Procedure(s): RIGHT MASTECTOMY WITH RIGHT SENTINEL LYMPH NODE BIOPSY (Right) LEFT TOTAL MASTECTOMY (Left) BREAST RECONSTRUCTION WITH PLACEMENT OF TISSUE EXPANDER AND ALLODERM (Bilateral) as a surgical intervention.  The patient's history has been reviewed, patient examined, no change in status, stable for surgery.  I have reviewed the patient's chart and labs.  Questions were answered to the patient's satisfaction.     Arnoldo Hooker Hope Brandenburger

## 2021-04-26 NOTE — Anesthesia Procedure Notes (Signed)
Anesthesia Regional Block: Pectoralis block   Pre-Anesthetic Checklist: , timeout performed,  Correct Patient, Correct Site, Correct Laterality,  Correct Procedure, Correct Position, site marked,  Risks and benefits discussed,  Surgical consent,  Pre-op evaluation,  At surgeon's request and post-op pain management  Laterality: Right  Prep: chloraprep       Needles:  Injection technique: Single-shot      Needle Length: 9cm  Needle Gauge: 22     Additional Needles: Arrow StimuQuik ECHO Echogenic Stimulating PNB Needle  Procedures:,,,, ultrasound used (permanent image in chart),,    Narrative:  Start time: 04/26/2021 12:14 PM End time: 04/26/2021 12:18 PM Injection made incrementally with aspirations every 5 mL.  Performed by: Personally  Anesthesiologist: Oleta Mouse, MD

## 2021-04-26 NOTE — Anesthesia Procedure Notes (Signed)
Procedure Name: Intubation Date/Time: 04/26/2021 12:33 PM Performed by: Willa Frater, CRNA Pre-anesthesia Checklist: Patient identified, Emergency Drugs available, Suction available and Patient being monitored Patient Re-evaluated:Patient Re-evaluated prior to induction Oxygen Delivery Method: Circle system utilized Preoxygenation: Pre-oxygenation with 100% oxygen Induction Type: IV induction Ventilation: Mask ventilation without difficulty Laryngoscope Size: Mac and 3 Grade View: Grade I Tube type: Oral Number of attempts: 1 Airway Equipment and Method: Stylet and Oral airway Placement Confirmation: ETT inserted through vocal cords under direct vision, positive ETCO2 and breath sounds checked- equal and bilateral Secured at: 23 cm Tube secured with: Tape Dental Injury: Teeth and Oropharynx as per pre-operative assessment

## 2021-04-26 NOTE — Progress Notes (Signed)
Patient had surgery today. Will follow for path results and send for oncotype score.  Oncology Nurse Navigator Documentation  Oncology Nurse Navigator Flowsheets 04/26/2021  Abnormal Finding Date -  Confirmed Diagnosis Date -  Diagnosis Status -  Planned Course of Treatment -  Phase of Treatment Surgery  Expected Surgery Date -  Surgery Actual Start Date: 04/26/2021  Navigator Follow Up Date: 04/30/2021  Navigator Follow Up Reason: Pathology  Navigator Location [facility]  Referral Date to RadOnc/MedOnc -  Navigator Encounter Type Appt/Treatment Plan Review  Telephone -  Treatment Initiated Date 04/26/2021  Patient Visit Type MedOnc  Treatment Phase Active Tx  Barriers/Navigation Needs Coordination of Care;Education  Education -  Interventions None Required  Acuity Level 2-Minimal Needs (1-2 Barriers Identified)  Coordination of Care -  Education Method -  Support Groups/Services Friends and Family  Time Spent with Patient 15

## 2021-04-26 NOTE — Interval H&P Note (Signed)
History and Physical Interval Note:no change in H and P  04/26/2021 12:07 PM  Katelyn Reyes  has presented today for surgery, with the diagnosis of RIGHT BREAST CANCER.  The various methods of treatment have been discussed with the patient and family. After consideration of risks, benefits and other options for treatment, the patient has consented to  Procedure(s): RIGHT MASTECTOMY WITH RIGHT SENTINEL LYMPH NODE BIOPSY (Right) LEFT TOTAL MASTECTOMY (Left) BREAST RECONSTRUCTION WITH PLACEMENT OF TISSUE EXPANDER AND ALLODERM (Bilateral) as a surgical intervention.  The patient's history has been reviewed, patient examined, no change in status, stable for surgery.  I have reviewed the patient's chart and labs.  Questions were answered to the patient's satisfaction.     [name]

## 2021-04-26 NOTE — Op Note (Signed)
Operative Note   DATE OF OPERATION: 11.11.22  LOCATION: [facility]-observation  SURGICAL DIVISION: Plastic Surgery  PREOPERATIVE DIAGNOSES:  1. Right breast cancer ER+  POSTOPERATIVE DIAGNOSES:  same  PROCEDURE:  1. Bilateral breast reconstruction with tissue expanders 2. Acellular dermis (Alloderm) to bilateral chest 600 cm2  SURGEON: Irene Limbo MD MBA  ASSISTANT: none  ANESTHESIA:  General.   EBL: 45 ml for entire procedure  COMPLICATIONS: None immediate.   INDICATIONS FOR PROCEDURE:  The patient, Katelyn Reyes, is a 60 y.o. female born on [birth year], is here for immediate prepectoral tissue expander based reconstruction following skin reduction mastectomies.   FINDINGS: Natrelle 133S FV-13-T 500 ml tissue expanders placed bilateral, initial fill volume 250 ml air bilateral. RIGHT SN [id number] LEFT SN [id number]  DESCRIPTION OF PROCEDURE:  The patient was marked with the patient in the preoperative area to mark sternal notch, chest midline, anterior axillary lines and inframammary folds. Patient was marked for skin reduction mastectomy with most superior portion nipple areola marked on breast meridian. Vertical limbs marked at lateral border areolae and set at 8.5 cm length. The patient was taken to the operating room. SCDs were placed and IV antibiotics were given. Foley catheter placed. The patient's operative site was prepped and draped in a sterile fashion. A time out was performed and all information was confirmed to be correct. Following completion of mastectomies, reconstruction began on left side. In supine position, the lateral limbs for resection marked and area over lower pole preserved as inferiorly based dermal pedicle. Skin de epithelialized in this area.     The cavity was irrigated with saline solution containing Ancef, gentamicin, and betadine. Hemostasis was ensured. A 19 Fr drain was placed in subcutaneous position laterally and a 15 Fr drain placed  along inframammary fold. Each secured to skin with 2-0 nylon. The tissue expanders were prepared on back table prior in insertion. The expander was filled with air. Perforated acellular dermis was  draped over anterior surface expander. The ADM was then secured to itself over posterior surface of expander with 4-0 chromic. Redundant folds acellular dermis excised so that the ADM lay flat without folds over air filled expander. The expander was secured to medial insertion pectoralis with a 0 vicryl. The superior and lateral tabs also secured to pectoralis muscle with 0-vicryl. The ADM was secured to pectoralis muscle and chest wall along inferior border at inframammary fold with 0 v lock suture. Laterally the mastectomy flap over posterior axillary line was advanced anteriorly and the subcutaneous tissue and superficial fascia was secured to chest wall with 0-vicryl. The inferiorly based dermal pedicle was redraped superiorly over expander and acellular dermis and secured to pectoralis with interrupted 0-vicryl. Skin closure completed with 3-0 vicryl in fascial layer and 4-0 vicryl in dermis. Skin closure completed with 4-0 monocryl subcuticular and tissue adhesive.   I then directed my attention to right chest where similar irrigation and drain placement completed. The prepared expander with ADM secured over anterior surface was placed in left chest and tabs secured to chest wall and pectoralis muscle with 0- vicryl suture. The acellular dermis at inframammary fold was secured to chest wall with 0 V-lock suture. Laterally the mastectomy flap over posterior axillary line was advanced anteriorly and the subcutaneous tissue and superficial fascia was secured to chest wall with 0-vicryl. The inferiorly based dermal pedicle was redraped superiorly over expander and acellular dermis and secured to pectoralis with interrupted 0-vicryl. Skin closure completed with 3-0 vicryl in fascial  layer and 4-0 vicryl in dermis. Skin  closure completed with 4-0 monocryl subcuticular and tissue adhesive. Tegaderms applied bilateral, followed by dry dressing and breast.  The patient was allowed to wake from anesthesia, extubated and taken to the recovery room in satisfactory condition.   SPECIMENS: none  DRAINS: 15 and 19 Fr JP in right and left reconstruction

## 2021-04-26 NOTE — Progress Notes (Signed)
Assisted Dr. Ermalene Postin with right, left, ultrasound guided, pectoralis block. Side rails up, monitors on throughout procedure. See vital signs in flow sheet. Tolerated Procedure well.

## 2021-04-26 NOTE — Op Note (Signed)
Katelyn Reyes 04/26/2021   Pre-op Diagnosis: Right breast cancer     Post-op Diagnosis: Same  Procedure(s): Bilateral total mastectomy with right deep axillary sentinel lymph node biopsy  Surgeon: [name], MD  Anesthesia: General  Staff:  Circulator: Patric Dykes, RN Scrub Person: Lorenza Burton, CST  Estimated Blood Loss: Minimal               Specimens: Sent to pathology  Indications: This is a 60 year old female presents with an invasive ductal carcinoma of the right breast 12 o'clock position.  She is elected proceed with mastectomies bilaterally with a right sentinel lymph node biopsy and immediate reconstruction by plastic surgery  Procedure: The patient was identified in the preoperative holding area.  Her right breast was prepped and I injected mag trace underneath the nipple areolar complex after anesthetizing the breast with lidocaine.  She was then taken to the operating room.  She had also received a preoperative pectoralis nerve block by anesthesiology.  She was placed upon the operating table general anesthesia was induced.  I then gently massaged the right breast.  Her bilateral breast and axilla were then prepped and draped in usual sterile fashion.  Dr. Iran Planas had marked both breast around the areola for the incisions.  We proceeded with the left mastectomy first.  I made a circumareolar incision with a scalpel through the marked area.  I then dissected down to the breast tissue circumferentially with electrocautery.  I then created a superior medial skin flap with electrocautery staying just underneath the skin dissecting toward the chest wall and superiorly toward the clavicle.  I then took dissection around laterally toward the upper outer quadrant in the axilla.  I next dissected inferiorly toward the inframammary ridge.  Again I stayed just under the skin and taken down to the chest wall.  I felt that the flap was still slightly thickened  superiorly so I started a new plane and dissected further.  I then completed the ostectomy taking the breast off of the chest wall.  I marked it laterally with a suture.  I still felt a thickened area of the skin flap medially and took some more breast tissue there and sent it with the mastectomy on the left side. I next turned my attention toward the right mastectomy.  Again I made a circumareolar incision through the previous marked area with a scalpel.  I then dissected into the breast tissue with electrocautery circumferentially.  I then dissected the superior skin flap going to the level of the clavicle.  She had had a previous lumpectomy and I encountered scar just underneath the skin.  I continued superiorly staying just underneath the skin and then down to the chest wall.  I dissected the flap medially and laterally as well with the cautery.  I then dissected out the inferior skin flap going down to the inframammary ridge again staying just underneath the skin and dermis.  I then completed the mastectomy dissecting the breast off the chest wall this time and a lateral to medial fashion.  Once the mastectomy was completed I marked the lateral margin with a silk suture.  I then brought the mag trace probe onto the field.  Identified an area of increased uptake in the right axilla.  I dissected in the deep axillary tissue and identified a lymph node which I excised with the surrounding fat.  Palpation of this appeared contained at least 1 more lymph node.  I then reevaluated  the axilla with the probe and found no other increased uptake.  There were no palpable nodes left in the axilla.  The sentinel node specimen was sent to pathology for evaluation.  I evaluated both mastectomy sites and hemostasis appeared to be achieved.  At this point Dr. Iran Planas continued the procedure and will dictate her portion of the reconstruction.  The patient continued in a stable condition in the operating room.           [name]   Date: 04/26/2021  Time: 1:45 PM

## 2021-04-26 NOTE — Transfer of Care (Signed)
Immediate Anesthesia Transfer of Care Note  Patient: Katelyn Reyes  Procedure(s) Performed: RIGHT MASTECTOMY WITH RIGHT SENTINEL LYMPH NODE BIOPSY (Right: Breast) LEFT TOTAL MASTECTOMY (Left: Breast) BREAST RECONSTRUCTION WITH PLACEMENT OF TISSUE EXPANDER AND ALLODERM (Bilateral: Breast)  Patient Location: PACU  Anesthesia Type:General  Level of Consciousness: awake, alert  and oriented  Airway & Oxygen Therapy: Patient Spontanous Breathing and Patient connected to face mask oxygen  Post-op Assessment: Report given to RN and Post -op Vital signs reviewed and stable  Post vital signs: Reviewed and stable  Last Vitals:  Vitals Value Taken Time  BP 106/62 04/26/21 1615  Temp    Pulse 86 04/26/21 1621  Resp 15 04/26/21 1621  SpO2 100 % 04/26/21 1621  Vitals shown include unvalidated device data.  Last Pain:  Vitals:   04/26/21 1103  TempSrc: Oral  PainSc: 0-No pain      Patients Stated Pain Goal: 4 (82/95/62 1308)  Complications: No notable events documented.

## 2021-04-26 NOTE — Anesthesia Procedure Notes (Signed)
Anesthesia Regional Block: Pectoralis block   Pre-Anesthetic Checklist: , timeout performed,  Correct Patient, Correct Site, Correct Laterality,  Correct Procedure, Correct Position, site marked,  Risks and benefits discussed,  Surgical consent,  Pre-op evaluation,  At surgeon's request and post-op pain management  Laterality: Left  Prep: chloraprep       Needles:  Injection technique: Single-shot      Needle Length: 9cm  Needle Gauge: 22     Additional Needles: Arrow StimuQuik ECHO Echogenic Stimulating PNB Needle  Procedures:,,,, ultrasound used (permanent image in chart),,    Narrative:  Start time: 04/26/2021 12:08 PM End time: 04/26/2021 12:13 PM Injection made incrementally with aspirations every 5 mL.  Performed by: Personally  Anesthesiologist: Oleta Mouse, MD

## 2021-04-26 NOTE — Discharge Instructions (Addendum)
[facility] [phone]  MASTECTOMY: POST OP INSTRUCTIONS  Always review your discharge instruction sheet given to you by the facility where your surgery was performed. IF YOU HAVE DISABILITY OR FAMILY LEAVE FORMS, YOU MUST BRING THEM TO THE OFFICE FOR PROCESSING.   DO NOT GIVE THEM TO YOUR DOCTOR. A prescription for pain medication may be given to you upon discharge.  Take your pain medication as prescribed, if needed.  If narcotic pain medicine is not needed, then you may take acetaminophen (Tylenol) or ibuprofen (Advil) as needed. Take your usually prescribed medications unless otherwise directed. If you need a refill on your pain medication, please contact your pharmacy.  They will contact our office to request authorization.  Prescriptions will not be filled after 5pm or on week-ends. You should follow a light diet the first few days after arrival home, such as soup and crackers, etc.  Resume your normal diet the day after surgery. Most patients will experience some swelling and bruising on the chest and underarm.  Ice packs will help.  Swelling and bruising can take several days to resolve.  It is common to experience some constipation if taking pain medication after surgery.  Increasing fluid intake and taking a stool softener (such as Colace) will usually help or prevent this problem from occurring.  A mild laxative (Milk of Magnesia or Miralax) should be taken according to package instructions if there are no bowel movements after 48 hours. Unless discharge instructions indicate otherwise, leave your bandage dry and in place until your next appointment in 3-5 days.  You may take a limited sponge bath.  No tube baths or showers until the drains are removed.  You may have steri-strips (small skin tapes) in place directly over the incision.  These strips should be left on the skin for 7-10 days.  If your surgeon used skin glue on the incision, you may shower in 24 hours.   The glue will flake off over the next 2-3 weeks.  Any sutures or staples will be removed at the office during your follow-up visit. DRAINS:  If you have drains in place, it is important to keep a list of the amount of drainage produced each day in your drains.  Before leaving the hospital, you should be instructed on drain care.  Call our office if you have any questions about your drains. ACTIVITIES:  You may resume regular (light) daily activities beginning the next day--such as daily self-care, walking, climbing stairs--gradually increasing activities as tolerated.  You may have sexual intercourse when it is comfortable.  Refrain from any heavy lifting or straining until approved by your doctor. You may drive when you are no longer taking prescription pain medication, you can comfortably wear a seatbelt, and you can safely maneuver your car and apply brakes. RETURN TO WORK:  __________________________________________________________ You should see your doctor in the office for a follow-up appointment approximately 3-5 days after your surgery.  Your doctor's nurse will typically make your follow-up appointment when she calls you with your pathology report.  Expect your pathology report 2-3 business days after your surgery.  You may call to check if you do not hear from us after three days.   OTHER INSTRUCTIONS: ______________________________________________________________________________________________ ____________________________________________________________________________________________ WHEN TO CALL YOUR DOCTOR: Fever over 101.0 Nausea and/or vomiting Extreme swelling or bruising Continued bleeding from incision. Increased pain, redness, or drainage from the incision. The clinic staff is available to answer your questions during regular business hours.  Please don't hesitate   to call and ask to speak to one of the nurses for clinical concerns.  If you have a medical emergency, go to the  nearest emergency room or call 911.  A surgeon from [facility] is always on call at the hospital. [address], [address], [place], [place]  [address] ? [address] [address], [place],    [address] [phone] ? [phone] ? FAX [phone] Web site: [email or web address]   About my Jackson-Pratt Bulb Drain  What is a Jackson-Pratt bulb? A Jackson-Pratt is a soft, round device used to collect drainage. It is connected to a long, thin drainage catheter, which is held in place by one or two small stiches near your surgical incision site. When the bulb is squeezed, it forms a vacuum, forcing the drainage to empty into the bulb.  Emptying the Jackson-Pratt bulb- To empty the bulb: 1. Release the plug on the top of the bulb. 2. Pour the bulb's contents into a measuring container which your nurse will provide. 3. Record the time emptied and amount of drainage. Empty the drain(s) as often as your     doctor or nurse recommends.  Date                  Time                    Amount (Drain 1)                 Amount (Drain 2)  _____________________________________________________________________  _____________________________________________________________________  _____________________________________________________________________  _____________________________________________________________________  _____________________________________________________________________  _____________________________________________________________________  _____________________________________________________________________  _____________________________________________________________________  Squeezing the Jackson-Pratt Bulb- To squeeze the bulb: 1. Make sure the plug at the top of the bulb is open. 2. Squeeze the bulb tightly in your fist. You will hear air squeezing from the bulb. 3. Replace the plug while the bulb is squeezed. 4. Use a safety pin to attach the bulb to your clothing.  This will keep the catheter from     pulling at the bulb insertion site.  When to call your doctor- Call your doctor if: Drain site becomes red, swollen or hot. You have a fever greater than 101 degrees F. There is oozing at the drain site. Drain falls out (apply a guaze bandage over the drain hole and secure it with tape). Drainage increases daily not related to activity patterns. (You will usually have more drainage when you are active than when you are resting.) Drainage has a bad odor.  

## 2021-04-27 DIAGNOSIS — C50111 Malignant neoplasm of central portion of right female breast: Secondary | ICD-10-CM | POA: Diagnosis not present

## 2021-04-27 DIAGNOSIS — Z170 Estrogen receptor positive status [ER+]: Secondary | ICD-10-CM | POA: Diagnosis not present

## 2021-04-27 NOTE — Discharge Summary (Signed)
Physician Discharge Summary  Patient ID: Katelyn Reyes MRN: [id number] DOB/AGE: May 18, 1961 60 y.o.  Admit date: 04/26/2021 Discharge date: 04/27/2021  Admission Diagnoses: Right breast cancer  Discharge Diagnoses:  Active Problems:   Breast cancer, right [facility])   Discharged Condition: stable  Hospital Course: Post operatively patient did well tolerating diet, ambulatory with minimal assist and pain controlled. Instructed on bathing and drain care.  Treatments: surgery: bilateral mastectomies right SLN bilateral breast reconstruction with tissue expanders acellular dermis 11.11.22  Discharge Exam: Blood pressure (!) 93/59, pulse 86, temperature 98.2 F (36.8 C), resp. rate 16, height 5\' 2"  (1.575 m), weight 83.6 kg, SpO2 98 %. Incision/Wound: incisions intact dry Tegaderms in place chest soft drains serosanguinous  Disposition: Discharge disposition: 01-Home or Self Care       Discharge Instructions     Call MD for:  redness, tenderness, or signs of infection (pain, swelling, bleeding, redness, odor or green/yellow discharge around incision site)   Complete by: As directed    Call MD for:  temperature >100.5   Complete by: As directed    Discharge instructions   Complete by: As directed    Ok to remove dressings and shower am 11.13.22. Soap and water ok, pat Tegaderms dry. Do not remove Tegaderms. No creams or ointments over incisions. Do not let drains dangle in shower, attach to lanyard or similar. Strip and record drains twice daily and bring log to clinic visit.  Breast binder or soft compression bra all other times.  Ok to raise arms above shoulders for bathing and dressing.  No house yard work or exercise until cleared by MD.   Patient received all Rx preop. Recommend ibuprofen with meals to aid with pain control. Also ok to use Tylenol for pain. Recommend Miralax or Dulcolax as needed for constipation.   Driving Restrictions   Complete by: As directed    No  driving for 2 weeks then no driving if taking prescription pain medication.   Lifting restrictions   Complete by: As directed    No lifting > 5-10 lbs until cleared by MD   Resume previous diet   Complete by: As directed       Allergies as of 04/27/2021       Reactions   Amoxicillin Rash, Other (See Comments)   Has patient had a PCN reaction causing immediate rash, facial/tongue/throat swelling, SOB or lightheadedness with hypotension: No Has patient had a PCN reaction causing severe rash involving mucus membranes or skin necrosis: No Has patient had a PCN reaction that required treatment: #  #  #  YES  #  #  # - MD office Has patient had a PCN reaction occurring within the last 10 years: Yes If all of the above answers are "NO", then may proceed with Cephalosporin use. Has patient had a PCN reaction causing immediate rash, facial/tongue/throat swelling, SOB or lightheadedness with hypotension: No Has patient had a PCN reaction causing severe rash involving mucus membranes or skin necrosis: No Has patient had a PCN reaction that required treatment: #  #  #  YES  #  #  # - MD office Has patient had a PCN reaction occurring within the last 10 years: Yes If all of the above answers are "NO", then may proceed with Cephalosporin use. Has patient had a PCN reaction causing immediate rash, facial/tongue/throat swelling, SOB or lightheadedness with hypotension: No Has patient had a PCN reaction causing severe rash involving mucus membranes or skin necrosis: No  Has patient had a PCN reaction that required treatment: #  #  #  YES  #  #  # - MD office Has patient had a PCN reaction occurring within the last 10 years: Yes If all of the above answers are "NO", then may proceed with Cephalosporin use.   Erythromycin Nausea Only   Hydrocodone-acetaminophen Other (See Comments)   Hallucinations   Hydrocodone-acetaminophen Nausea Only, Other (See Comments)   Hallucinations Hallucinations   Shellfish  Allergy Itching, Other (See Comments)   REACTS TO SCALLOPS REACTS TO SCALLOPS        Medication List     TAKE these medications    albuterol 108 (90 Base) MCG/ACT inhaler Commonly known as: VENTOLIN HFA INHALE 2 PUFFS INTO THE LUNGS EVERY 6 HOURS AS NEEDED FOR WHEEZING.   ALPRAZolam 0.5 MG tablet Commonly known as: XANAX Take 1 tablet (0.5 mg total) by mouth 3 (three) times daily as needed.   cetirizine 10 MG tablet Commonly known as: ZYRTEC TAKE 1 TABLET (10 MG TOTAL) BY MOUTH DAILY.   estradiol 1 MG tablet Commonly known as: ESTRACE Take 1 mg by mouth every evening.   fluticasone 50 MCG/ACT nasal spray Commonly known as: FLONASE PLACE 2 SPRAYS INTO BOTH NOSTRILS DAILY.   levonorgestrel 20 MCG/24HR IUD Commonly known as: MIRENA 1 each by Intrauterine route once.   meloxicam 15 MG tablet Commonly known as: MOBIC meloxicam 15 mg tablet  TAKE 1 TABLET(S) BY MOUTH TWICE DAILY FOR 5 DAYS, THEN TAKE ONE TABLET BY MOUTH DAILY AS NEEDED   methocarbamol 500 MG tablet Commonly known as: ROBAXIN Take 1 tablet (500 mg total) by mouth 3 (three) times daily as needed for use following surgery   montelukast 10 MG tablet Commonly known as: SINGULAIR TAKE 1 TABLET (10 MG TOTAL) BY MOUTH DAILY.   oxyCODONE 5 MG immediate release tablet Commonly known as: Oxy IR/ROXICODONE Take 1 tablet (5 mg total) by mouth every 4 (four) hours as needed for up to 5 days. For use following surgery   Ozempic (1 MG/DOSE) 4 MG/3ML Sopn Generic drug: Semaglutide (1 MG/DOSE) Inject 1 mg as directed once a week.   pantoprazole 40 MG tablet Commonly known as: PROTONIX TAKE 1 TABLET (40 MG TOTAL) BY MOUTH AT BEDTIME.   progesterone 200 MG capsule Commonly known as: PROMETRIUM Take 200 mg by mouth at bedtime.   sertraline 100 MG tablet Commonly known as: ZOLOFT TAKE 1 TABLET (100 MG TOTAL) BY MOUTH DAILY.   sulfamethoxazole-trimethoprim 800-160 MG tablet Commonly known as: BACTRIM DS Take  1 tablet by mouth 2 (two) times daily. For use following surgery   vitamin B-12 1000 MCG tablet Commonly known as: CYANOCOBALAMIN Take 1,000 mcg by mouth every evening.   VITAMIN D PO Take by mouth.   Vitamin D-3 125 MCG (5000 UT) Tabs Take 5,000 Units by mouth every evening.   zolpidem 10 MG tablet Commonly known as: AMBIEN TAKE 1 TABLET (10 MG TOTAL) BY MOUTH AT BEDTIME AS NEEDED. FOR SLEEP        Follow-up Information     [name], MD. Schedule an appointment as soon as possible for a visit in 3 week(s).   Specialty: General Surgery Contact information: [address] [place] [address] [phone]         Irene Limbo, MD Follow up in 1 week(s).   Specialty: Plastic Surgery Why: as scheduled Contact information: [place] [address] [phone]  SignedIrene Limbo 04/27/2021, 8:05 AM

## 2021-04-27 NOTE — Anesthesia Postprocedure Evaluation (Signed)
Anesthesia Post Note  Patient: MILESSA HOGAN  Procedure(s) Performed: RIGHT MASTECTOMY WITH RIGHT SENTINEL LYMPH NODE BIOPSY (Right: Breast) LEFT TOTAL MASTECTOMY (Left: Breast) BREAST RECONSTRUCTION WITH PLACEMENT OF TISSUE EXPANDER AND ALLODERM (Bilateral: Breast)     Patient location during evaluation: PACU Anesthesia Type: Regional and General Level of consciousness: awake and alert Pain management: pain level controlled Vital Signs Assessment: post-procedure vital signs reviewed and stable Respiratory status: spontaneous breathing, nonlabored ventilation, respiratory function stable and patient connected to nasal cannula oxygen Cardiovascular status: blood pressure returned to baseline and stable Postop Assessment: no apparent nausea or vomiting Anesthetic complications: no   No notable events documented.  Last Vitals:  Vitals:   04/27/21 0715 04/27/21 0815  BP: (!) 93/59   Pulse: 86 87  Resp: 16   Temp: 36.8 C   SpO2: 98% 98%    Last Pain:  Vitals:   04/27/21 0815  TempSrc:   PainSc: 3                  Garnett Rekowski

## 2021-04-29 ENCOUNTER — Encounter (HOSPITAL_BASED_OUTPATIENT_CLINIC_OR_DEPARTMENT_OTHER): Payer: Self-pay | Admitting: Surgery

## 2021-04-30 ENCOUNTER — Encounter (HOSPITAL_BASED_OUTPATIENT_CLINIC_OR_DEPARTMENT_OTHER): Payer: Self-pay | Admitting: Surgery

## 2021-04-30 LAB — SURGICAL PATHOLOGY

## 2021-05-01 ENCOUNTER — Encounter: Payer: Self-pay | Admitting: *Deleted

## 2021-05-01 NOTE — Progress Notes (Signed)
Per Dr Marin Olp, oncotype score testing request sent on specimen [id number] DOS 04/26/2021  Oncology Nurse Navigator Documentation  Oncology Nurse Navigator Flowsheets 05/01/2021  Abnormal Finding Date -  Confirmed Diagnosis Date -  Diagnosis Status -  Planned Course of Treatment -  Phase of Treatment -  Expected Surgery Date -  Surgery Actual Start Date: -  Navigator Follow Up Date: 05/28/2021  Navigator Follow Up Reason: Follow-up Appointment  Navigator Location [facility]  Referral Date to RadOnc/MedOnc -  Navigator Encounter Type Molecular Studies;Pathology Review  Telephone -  Treatment Initiated Date -  Patient Visit Type MedOnc  Treatment Phase Active Tx  Barriers/Navigation Needs Coordination of Care;Education  Education -  Interventions Coordination of Care  Acuity Level 2-Minimal Needs (1-2 Barriers Identified)  Coordination of Care Pathology  Education Method -  Support Groups/Services Friends and Family  Time Spent with Patient 62

## 2021-05-14 DIAGNOSIS — C50911 Malignant neoplasm of unspecified site of right female breast: Secondary | ICD-10-CM | POA: Diagnosis not present

## 2021-05-14 DIAGNOSIS — Z9012 Acquired absence of left breast and nipple: Secondary | ICD-10-CM | POA: Diagnosis not present

## 2021-05-23 ENCOUNTER — Other Ambulatory Visit (HOSPITAL_COMMUNITY): Payer: Self-pay

## 2021-05-23 DIAGNOSIS — Z86010 Personal history of colonic polyps: Secondary | ICD-10-CM | POA: Diagnosis not present

## 2021-05-23 DIAGNOSIS — Z30432 Encounter for removal of intrauterine contraceptive device: Secondary | ICD-10-CM | POA: Diagnosis not present

## 2021-05-23 DIAGNOSIS — E669 Obesity, unspecified: Secondary | ICD-10-CM | POA: Diagnosis not present

## 2021-05-23 DIAGNOSIS — K5730 Diverticulosis of large intestine without perforation or abscess without bleeding: Secondary | ICD-10-CM | POA: Diagnosis not present

## 2021-05-23 DIAGNOSIS — C50911 Malignant neoplasm of unspecified site of right female breast: Secondary | ICD-10-CM | POA: Diagnosis not present

## 2021-05-23 DIAGNOSIS — Z1211 Encounter for screening for malignant neoplasm of colon: Secondary | ICD-10-CM | POA: Diagnosis not present

## 2021-05-23 MED ORDER — CLENPIQ 10-3.5-12 MG-GM -GM/160ML PO SOLN
ORAL | 0 refills | Status: DC
  Filled 2021-05-23: qty 320, 1d supply, fill #0

## 2021-05-25 ENCOUNTER — Encounter (HOSPITAL_BASED_OUTPATIENT_CLINIC_OR_DEPARTMENT_OTHER): Payer: Self-pay

## 2021-05-25 ENCOUNTER — Other Ambulatory Visit: Payer: Self-pay

## 2021-05-25 ENCOUNTER — Emergency Department (HOSPITAL_BASED_OUTPATIENT_CLINIC_OR_DEPARTMENT_OTHER)
Admission: EM | Admit: 2021-05-25 | Discharge: 2021-05-25 | Disposition: A | Discharge status: Home / Self Care | Payer: 59 | Attending: Emergency Medicine | Admitting: Emergency Medicine

## 2021-05-25 DIAGNOSIS — J4520 Mild intermittent asthma, uncomplicated: Secondary | ICD-10-CM | POA: Insufficient documentation

## 2021-05-25 DIAGNOSIS — R197 Diarrhea, unspecified: Secondary | ICD-10-CM | POA: Insufficient documentation

## 2021-05-25 DIAGNOSIS — Z9049 Acquired absence of other specified parts of digestive tract: Secondary | ICD-10-CM | POA: Insufficient documentation

## 2021-05-25 DIAGNOSIS — R1010 Upper abdominal pain, unspecified: Secondary | ICD-10-CM | POA: Diagnosis not present

## 2021-05-25 DIAGNOSIS — K219 Gastro-esophageal reflux disease without esophagitis: Secondary | ICD-10-CM | POA: Diagnosis not present

## 2021-05-25 DIAGNOSIS — R1013 Epigastric pain: Secondary | ICD-10-CM | POA: Diagnosis not present

## 2021-05-25 DIAGNOSIS — R112 Nausea with vomiting, unspecified: Secondary | ICD-10-CM | POA: Insufficient documentation

## 2021-05-25 DIAGNOSIS — K529 Noninfective gastroenteritis and colitis, unspecified: Secondary | ICD-10-CM

## 2021-05-25 DIAGNOSIS — Z79899 Other long term (current) drug therapy: Secondary | ICD-10-CM | POA: Diagnosis not present

## 2021-05-25 DIAGNOSIS — Z853 Personal history of malignant neoplasm of breast: Secondary | ICD-10-CM | POA: Diagnosis not present

## 2021-05-25 HISTORY — DX: Malignant (primary) neoplasm, unspecified: C80.1

## 2021-05-25 LAB — COMPREHENSIVE METABOLIC PANEL
ALT: 29 U/L (ref 0–44)
AST: 28 U/L (ref 15–41)
Albumin: 4.2 g/dL (ref 3.5–5.0)
Alkaline Phosphatase: 88 U/L (ref 38–126)
Anion gap: 10 (ref 5–15)
BUN: 15 mg/dL (ref 6–20)
CO2: 28 mmol/L (ref 22–32)
Calcium: 9.8 mg/dL (ref 8.9–10.3)
Chloride: 100 mmol/L (ref 98–111)
Creatinine, Ser: 0.98 mg/dL (ref 0.44–1.00)
GFR, Estimated: >60 mL/min (ref >60)
Glucose, Bld: 124 mg/dL — ABNORMAL HIGH (ref 70–99)
Potassium: 3.2 mmol/L — ABNORMAL LOW (ref 3.5–5.1)
Sodium: 138 mmol/L (ref 135–145)
Total Bilirubin: 0.5 mg/dL (ref 0.3–1.2)
Total Protein: 7.7 g/dL (ref 6.5–8.1)

## 2021-05-25 LAB — URINALYSIS, ROUTINE W REFLEX MICROSCOPIC
Bilirubin Urine: NEGATIVE
Glucose, UA: NEGATIVE mg/dL
Ketones, ur: NEGATIVE mg/dL
Leukocytes,Ua: NEGATIVE
Nitrite: NEGATIVE
Protein, ur: NEGATIVE mg/dL
Specific Gravity, Urine: 1.006 (ref 1.005–1.030)
pH: 6.5 (ref 5.0–8.0)

## 2021-05-25 LAB — CBC WITH DIFFERENTIAL/PLATELET
Abs Immature Granulocytes: 0.01 10*3/uL (ref 0.00–0.07)
Basophils Absolute: 0 10*3/uL (ref 0.0–0.1)
Basophils Relative: 0 %
Eosinophils Absolute: 0.2 10*3/uL (ref 0.0–0.5)
Eosinophils Relative: 3 %
HCT: 37.7 % (ref 36.0–46.0)
Hemoglobin: 12.3 g/dL (ref 12.0–15.0)
Immature Granulocytes: 0 %
Lymphocytes Relative: 49 %
Lymphs Abs: 2.8 10*3/uL (ref 0.7–4.0)
MCH: 31.1 pg (ref 26.0–34.0)
MCHC: 32.6 g/dL (ref 30.0–36.0)
MCV: 95.2 fL (ref 80.0–100.0)
Monocytes Absolute: 0.3 10*3/uL (ref 0.1–1.0)
Monocytes Relative: 6 %
Neutro Abs: 2.4 10*3/uL (ref 1.7–7.7)
Neutrophils Relative %: 42 %
Platelets: 269 10*3/uL (ref 150–400)
RBC: 3.96 MIL/uL (ref 3.87–5.11)
RDW: 12.3 % (ref 11.5–15.5)
WBC: 5.8 10*3/uL (ref 4.0–10.5)
nRBC: 0 % (ref 0.0–0.2)

## 2021-05-25 LAB — TROPONIN I (HIGH SENSITIVITY): Troponin I (High Sensitivity): 3 ng/L (ref <18)

## 2021-05-25 LAB — LIPASE, BLOOD: Lipase: 24 U/L (ref 11–51)

## 2021-05-25 MED ORDER — PROMETHAZINE HCL 25 MG PO TABS: 25.0000 mg | ORAL_TABLET | Freq: Four times a day (QID) | ORAL | 0 refills | Status: DC | PRN

## 2021-05-25 MED ORDER — ONDANSETRON HCL 4 MG/2ML IJ SOLN
4.0000 mg | Freq: Once | INTRAMUSCULAR | Status: AC
  Administered 2021-05-25: 4 mg via INTRAVENOUS
  Filled 2021-05-25: qty 2

## 2021-05-25 MED ORDER — FENTANYL CITRATE PF 50 MCG/ML IJ SOSY
50.0000 ug | PREFILLED_SYRINGE | Freq: Once | INTRAMUSCULAR | Status: AC
  Administered 2021-05-25: 50 ug via INTRAVENOUS
  Filled 2021-05-25: qty 1

## 2021-05-25 MED ORDER — POTASSIUM CHLORIDE CRYS ER 20 MEQ PO TBCR
40.0000 meq | EXTENDED_RELEASE_TABLET | Freq: Once | ORAL | Status: AC
  Administered 2021-05-25: 40 meq via ORAL
  Filled 2021-05-25: qty 2

## 2021-05-25 MED ORDER — SODIUM CHLORIDE 0.9 % IV BOLUS
1000.0000 mL | Freq: Once | INTRAVENOUS | Status: AC
  Administered 2021-05-25: 1000 mL via INTRAVENOUS

## 2021-05-25 MED ORDER — METOCLOPRAMIDE HCL 5 MG/ML IJ SOLN
10.0000 mg | Freq: Once | INTRAMUSCULAR | Status: AC
  Administered 2021-05-25: 10 mg via INTRAVENOUS
  Filled 2021-05-25: qty 2

## 2021-05-25 MED ORDER — ALUM & MAG HYDROXIDE-SIMETH 200-200-20 MG/5ML PO SUSP
30.0000 mL | Freq: Once | ORAL | Status: AC
  Administered 2021-05-25: 30 mL via ORAL
  Filled 2021-05-25: qty 30

## 2021-05-25 NOTE — ED Provider Notes (Signed)
7:43 AM Care transferred to me earlier this shift. Patient was given po potassium and tolerated.  However she is still complaining of diffuse upper abdominal discomfort and nausea.  We will try some more symptomatic care.  She has some vague diffuse tenderness, primarily in the upper abdomen and I suspect based on history this is gastroenteritis.  Will reassess after further medication control.  9:02 AM Patient is feeling much better.  Feels like her abdominal pain is better though there is a little bit of bloating.  I still suspect this is gastroenteritis I do not feel she needs emergent CT as I have low suspicion for acute intra-abdominal emergency.  We discussed return precautions but otherwise will give supportive care at home.  She is on Protonix.  We will give some Phenergan for nausea.   Sherwood Gambler, MD 05/25/21 [phone]

## 2021-05-25 NOTE — ED Triage Notes (Signed)
Started last night with burping and this morning at 5 woke up nauseated and has vomited 4 times since. Also having diarrhea.

## 2021-05-25 NOTE — Discharge Instructions (Signed)
If you develop worsening, continued, or recurrent abdominal pain, uncontrolled vomiting, fever, chest or back pain, or any other new/concerning symptoms then return to the ER for evaluation.  

## 2021-05-25 NOTE — ED Provider Notes (Signed)
[facility] EMER[facility] DEPT Provider Note   CSN: [id number] Arrival date & time: 05/25/21  9211     History Chief Complaint  Patient presents with   Nausea    Katelyn Reyes is a 60 y.o. female.  Patient with a history of breast cancer status post recent mastectomy and reconstruction here with sudden onset "indigestion" last night followed by multiple episodes of vomiting, nausea and diarrhea since 5 AM.  Reports 4 episodes of nonbloody nonbilious emesis and 4 episodes of loose stools.  No black or bloody stools.  No fever.  States normal day yesterday and was feeling well eating and drinking normally.  She saw both her plastic surgeon and general surgeon and was told her wounds were healing well and they had no concerns.  She has not yet started any chemotherapy or radiation or immunotherapy. She ate some cake last night and began to feel some indigestion after that.  Her mother also had the cake and has a having diarrhea as well but no vomiting. No travel or sick contacts.  Was on the 5-day course of Septa about 3 weeks ago.  Previous cholecystectomy.  Still has appendix. Does have a history of acid reflux but this does not feel the same.   The history is provided by the patient.      Past Medical History:  Diagnosis Date   Allergic rhinitis    Anxiety    Arthritis    Asthma    related to seasonal allergies   Breast calcification, right 02/02/2013   Surgically excised 02/17/13 > path benign    Cancer ([facility])    Depression    Family history of breast cancer 04/12/2021   Family history of ovarian cancer 04/12/2021   GERD (gastroesophageal reflux disease)    Hyperlipidemia    IUD 02/07/2010    Patient Active Problem List   Diagnosis Date Noted   Breast cancer, right ([facility]) 04/26/2021   Genetic testing 04/19/2021   Family history of breast cancer 04/12/2021   Malignant neoplasm of upper outer quadrant of female breast ([facility]) 04/12/2021   Family history of  colon cancer 04/12/2021   Family history of ovarian cancer 94/17/4081   Metabolic syndrome 44/81/8563   Change in bowel habit 07/17/2020   Diverticular disease of colon 07/17/2020   Family history of malignant neoplasm of gastrointestinal tract 07/17/2020   Fatty liver 07/17/2020   Morbid obesity ([facility]) 07/17/2020   Nausea 14/97/0263   Nonalcoholic steatohepatitis ([facility]) 07/17/2020   Personal history of colonic polyps 07/17/2020   Epigastric pain 07/17/2020   Acute medial meniscal tear, right, subsequent encounter 05/01/2020   ETD (Eustachian tube dysfunction), bilateral 12/27/2019   Obesity (BMI 30-39.9) 07/12/2018   Cervical spondylosis with radiculopathy 11/12/2017   Vitamin D deficiency 07/07/2017   Urinary incontinence in female 12/22/2013   Asthma, mild intermittent 03/22/2013   Vaginal itching 06/01/2012   Thyromegaly 03/18/2012   General medical examination 03/14/2011   CHICKENPOX, HX OF 02/15/2010   Hyperlipidemia 02/14/2009   DEPRESSION 02/14/2009   ALLERGIC RHINITIS 02/14/2009    Past Surgical History:  Procedure Laterality Date   BREAST BIOPSY Right 02/17/2013   Procedure:  NEEDLE LOCALIZATION REMOVAL RIGHT BREAST CALCIFICATIONS;  Surgeon: Haywood Lasso, MD;  Location: [facility];  Service: General;  Laterality: Right;   BREAST EXCISIONAL BIOPSY Right pt unsure   benign   BREAST RECONSTRUCTION WITH PLACEMENT OF TISSUE EXPANDER AND ALLODERM Bilateral 04/26/2021   Procedure: BREAST RECONSTRUCTION WITH PLACEMENT OF TISSUE  EXPANDER AND ALLODERM;  Surgeon: Irene Limbo, MD;  Location: [facility];  Service: Plastics;  Laterality: Bilateral;   BURCH PROCEDURE N/A 12/22/2013   Procedure: BURCH CYSTO URETHROPEXY, ANTERIOR AND POSTERIOR CULPORRHAPHY;  Surgeon: Ena Dawley, MD;  Location: [facility];  Service: Gynecology;  Laterality: N/A;   CERVICAL DISC ARTHROPLASTY N/A 11/12/2017   Procedure: ARTIFICIAL DISC REPLACEMENT CERVICAL SIX-SEVEN;   Surgeon: Ashok Pall, MD;  Location: [facility];  Service: Neurosurgery;  Laterality: N/A;  ARTIFICIAL DISC REPLACEMENT CERVICAL 6- CERVICAL 7   CHOLECYSTECTOMY N/A 10/09/2017   Procedure: LAPAROSCOPIC CHOLECYSTECTOMY WITH INTRAOPERATIVE CHOLANGIOGRAM;  Surgeon: Judeth Horn, MD;  Location: [facility];  Service: General;  Laterality: N/A;   KNEE ARTHROSCOPY WITH MEDIAL MENISECTOMY Right 05/01/2020   Procedure: KNEE ARTHROSCOPY WITH PARTIAL MEDIAL MENISECTOMY,   PATELLA  CHONDROPLASTY;  Surgeon: Paralee Cancel, MD;  Location: [facility];  Service: Orthopedics;  Laterality: Right;   MASTECTOMY W/ SENTINEL NODE BIOPSY Right 04/26/2021   Procedure: RIGHT MASTECTOMY WITH RIGHT SENTINEL LYMPH NODE BIOPSY;  Surgeon: [name], MD;  Location: [facility];  Service: General;  Laterality: Right;   MOUTH SURGERY     NASAL SINUS SURGERY     TOTAL MASTECTOMY Left 04/26/2021   Procedure: LEFT TOTAL MASTECTOMY;  Surgeon: [name], MD;  Location: [facility];  Service: General;  Laterality: Left;     OB History   No obstetric history on file.     Family History  Problem Relation Age of Onset   Fibrocystic breast disease Mother    Hypertension Father    Diabetes Father    Breast cancer Maternal Aunt        dx 16s   Colon cancer Paternal Aunt        x2 pat aunts, dx after 37   Stroke Maternal Grandmother    Cancer Other        ovarian/GYN; MGF's mother   Colon cancer Other        MGM's father; dx after 45   Coronary artery disease Neg Hx     Social History   Tobacco Use   Smoking status: Never   Smokeless tobacco: Never  Vaping Use   Vaping Use: Never used  Substance Use Topics   Alcohol use: Yes    Comment: social   Drug use: No    Home Medications Prior to Admission medications   Medication Sig Start Date End Date Taking? Authorizing Provider  albuterol (VENTOLIN HFA) 108 (90 Base) MCG/ACT inhaler INHALE 2 PUFFS INTO THE LUNGS  EVERY 6 HOURS AS NEEDED FOR WHEEZING. 07/17/20 07/17/21  Midge Minium, MD  ALPRAZolam Duanne Moron) 0.5 MG tablet Take 1 tablet (0.5 mg total) by mouth 3 (three) times daily as needed. 03/21/21   Midge Minium, MD  cetirizine (ZYRTEC) 10 MG tablet TAKE 1 TABLET (10 MG TOTAL) BY MOUTH DAILY. 07/17/20 07/17/21  Midge Minium, MD  Cholecalciferol (VITAMIN D-3) 125 MCG (5000 UT) TABS Take 5,000 Units by mouth every evening.    [provider]  estradiol (ESTRACE) 1 MG tablet Take 1 mg by mouth every evening.  10/08/15   [provider]  fluticasone (FLONASE) 50 MCG/ACT nasal spray PLACE 2 SPRAYS INTO BOTH NOSTRILS DAILY. 07/17/20 07/17/21  Midge Minium, MD  levonorgestrel (MIRENA) 20 MCG/24HR IUD 1 each by Intrauterine route once.    [provider]  meloxicam (MOBIC) 15 MG tablet meloxicam 15 mg tablet  TAKE 1 TABLET(S) BY  MOUTH TWICE DAILY FOR 5 DAYS, THEN TAKE ONE TABLET BY MOUTH DAILY AS NEEDED    [provider]  methocarbamol (ROBAXIN) 500 MG tablet Take 1 tablet (500 mg total) by mouth 3 (three) times daily as needed for use following surgery 04/16/21     montelukast (SINGULAIR) 10 MG tablet TAKE 1 TABLET (10 MG TOTAL) BY MOUTH DAILY. 07/17/20 07/17/21  Midge Minium, MD  oxyCODONE (OXY IR/ROXICODONE) 5 MG immediate release tablet Take 1 tablet (5 mg total) by mouth every 4 (four) hours as needed for up to 5 days. For use following surgery 04/16/21     pantoprazole (PROTONIX) 40 MG tablet TAKE 1 TABLET (40 MG TOTAL) BY MOUTH AT BEDTIME. 07/17/20 07/17/21  Midge Minium, MD  progesterone (PROMETRIUM) 200 MG capsule Take 200 mg by mouth at bedtime. 03/21/20   [provider]  Semaglutide, 1 MG/DOSE, (OZEMPIC, 1 MG/DOSE,) 4 MG/3ML SOPN Inject 1 mg as directed once a week. 04/25/21   Maximiano Coss, NP  sertraline (ZOLOFT) 100 MG tablet TAKE 1 TABLET (100 MG TOTAL) BY MOUTH DAILY. 07/17/20 07/17/21  Midge Minium, MD  Sod Picosulfate-Mag Ox-Cit  [facility]cd [facility]) 10-3.5-12 MG-GM -GM/160ML SOLN Take as directed per office and not instructions on packaging. Do not refrigerate. 05/23/21     vitamin B-12 (CYANOCOBALAMIN) 1000 MCG tablet Take 1,000 mcg by mouth every evening.    [provider]  VITAMIN D PO Take by mouth.    [provider]  zolpidem (AMBIEN) 10 MG tablet TAKE 1 TABLET (10 MG TOTAL) BY MOUTH AT BEDTIME AS NEEDED. FOR SLEEP 07/17/20 04/26/21  Midge Minium, MD    Allergies    Amoxicillin, Erythromycin, Hydrocodone-acetaminophen, Hydrocodone-acetaminophen, and Shellfish allergy  Review of Systems   Review of Systems  Constitutional:  Positive for activity change and appetite change. Negative for fever.  HENT:  Negative for congestion and rhinorrhea.   Eyes:  Negative for visual disturbance.  Respiratory:  Negative for cough, chest tightness and shortness of breath.   Gastrointestinal:  Positive for abdominal pain, diarrhea, nausea and vomiting.  Genitourinary:  Negative for dysuria and hematuria.  Musculoskeletal:  Negative for arthralgias and myalgias.  Skin:  Negative for rash.  Neurological:  Negative for dizziness, weakness and headaches.   all other systems are negative except as noted in the HPI and PMH.   Physical Exam Updated Vital Signs BP 121/88 (BP Location: Left Arm)   Pulse (!) 101   Temp 98.8 F (37.1 C) (Oral)   Resp 18   Ht 5\' 2"  (1.575 m)   Wt 82.6 kg   LMP  (LMP Unknown)   SpO2 96%   BMI 33.29 kg/m   Physical Exam Vitals and nursing note reviewed.  Constitutional:      General: She is not in acute distress.    Appearance: She is well-developed.  HENT:     Head: Normocephalic and atraumatic.     Mouth/Throat:     Pharynx: No oropharyngeal exudate.  Eyes:     Conjunctiva/sclera: Conjunctivae normal.     Pupils: Pupils are equal, round, and reactive to light.  Neck:     Comments: No meningismus. Cardiovascular:     Rate and Rhythm: Normal rate and regular rhythm.      Heart sounds: Normal heart sounds. No murmur heard. Pulmonary:     Effort: Pulmonary effort is normal. No respiratory distress.     Breath sounds: Normal breath sounds.  Abdominal:  Palpations: Abdomen is soft.     Tenderness: There is abdominal tenderness. There is no guarding or rebound.     Comments: Diffuse tenderness, worse in the epigastrium, no lower abdominal pain  Musculoskeletal:        General: No tenderness. Normal range of motion.     Cervical back: Normal range of motion and neck supple.  Skin:    General: Skin is warm.     Capillary Refill: Capillary refill takes less than 2 seconds.  Neurological:     General: No focal deficit present.     Mental Status: She is alert and oriented to person, place, and time. Mental status is at baseline.     Cranial Nerves: No cranial nerve deficit.     Motor: No abnormal muscle tone.     Coordination: Coordination normal.     Comments:  5/5 strength throughout. CN 2-12 intact.Equal grip strength.   Psychiatric:        Behavior: Behavior normal.    ED Results / Procedures / Treatments   Labs (all labs ordered are listed, but only abnormal results are displayed) Labs Reviewed  COMPREHENSIVE METABOLIC PANEL - Abnormal; Notable for the following components:      Result Value   Potassium 3.2 (*)    Glucose, Bld 124 (*)    All other components within normal limits  C DIFFICILE QUICK SCREEN W PCR REFLEX    GASTROINTESTINAL PANEL BY PCR, STOOL (REPLACES STOOL CULTURE)  CBC WITH DIFFERENTIAL/PLATELET  LIPASE, BLOOD  URINALYSIS, ROUTINE W REFLEX MICROSCOPIC  TROPONIN I (HIGH SENSITIVITY)    EKG EKG Interpretation  Date/Time:  Saturday May 25 2021 06:37:43 EST Ventricular Rate:  93 PR Interval:  143 QRS Duration: 60 QT Interval:  453 QTC Calculation: 564 R Axis:   37 Text Interpretation: Sinus rhythm Low voltage, precordial leads Borderline T abnormalities, diffuse leads Prolonged QT interval Nonspecific T wave  abnormality Confirmed by Ezequiel Essex [phone]) on 05/25/2021 6:44:31 AM  Radiology No results found.  Procedures Procedures   Medications Ordered in ED Medications  sodium chloride 0.9 % bolus 1,000 mL (has no administration in time range)  ondansetron (ZOFRAN) injection 4 mg (has no administration in time range)  fentaNYL (SUBLIMAZE) injection 50 mcg (has no administration in time range)    ED Course  I have reviewed the triage vital signs and the nursing notes.  Pertinent labs & imaging results that were available during my care of the patient were reviewed by me and considered in my medical decision making (see chart for details).    MDM Rules/Calculators/A&P                          Sudden onset of nausea, vomiting and diarrhea within the past 2 hours.  Upper abdominal pain.  Vital stable, no distress. Suspicious food intake likely.  Patient given fluids as well as symptom control.  Her abdomen is soft without peritoneal signs.  LFTs and lipase are normal.  No leukocytosis.  Suspect likely viral gastroenteritis versus foodborne illness. Continue hydration.  At shift change, she is pending symptom control with reassessment.  Holding off on imaging currently.  Patient informed to provide stool studies if she is able to.  Care transferred to Dr. Regenia Skeeter. Final Clinical Impression(s) / ED Diagnoses Final diagnoses:  None    Rx / DC Orders ED Discharge Orders     None        Lucy Boardman, Annie Main, MD  05/25/21 0710  

## 2021-05-27 ENCOUNTER — Other Ambulatory Visit: Payer: Self-pay | Admitting: *Deleted

## 2021-05-27 DIAGNOSIS — Z803 Family history of malignant neoplasm of breast: Secondary | ICD-10-CM

## 2021-05-27 DIAGNOSIS — C50411 Malignant neoplasm of upper-outer quadrant of right female breast: Secondary | ICD-10-CM

## 2021-05-28 ENCOUNTER — Other Ambulatory Visit: Payer: Self-pay | Admitting: *Deleted

## 2021-05-28 ENCOUNTER — Encounter: Payer: Self-pay | Admitting: *Deleted

## 2021-05-28 ENCOUNTER — Encounter: Payer: Self-pay | Admitting: Hematology & Oncology

## 2021-05-28 ENCOUNTER — Other Ambulatory Visit: Payer: Self-pay

## 2021-05-28 ENCOUNTER — Inpatient Hospital Stay: Payer: 59 | Attending: Internal Medicine | Admitting: Hematology & Oncology

## 2021-05-28 ENCOUNTER — Inpatient Hospital Stay: Payer: 59

## 2021-05-28 ENCOUNTER — Other Ambulatory Visit (HOSPITAL_BASED_OUTPATIENT_CLINIC_OR_DEPARTMENT_OTHER): Payer: Self-pay

## 2021-05-28 VITALS — BP 112/92 | HR 95 | Temp 98.5°F | Resp 18 | Wt 177.0 lb

## 2021-05-28 DIAGNOSIS — C50411 Malignant neoplasm of upper-outer quadrant of right female breast: Secondary | ICD-10-CM

## 2021-05-28 DIAGNOSIS — Z170 Estrogen receptor positive status [ER+]: Secondary | ICD-10-CM | POA: Insufficient documentation

## 2021-05-28 DIAGNOSIS — C50911 Malignant neoplasm of unspecified site of right female breast: Secondary | ICD-10-CM | POA: Insufficient documentation

## 2021-05-28 DIAGNOSIS — A084 Viral intestinal infection, unspecified: Secondary | ICD-10-CM

## 2021-05-28 DIAGNOSIS — R112 Nausea with vomiting, unspecified: Secondary | ICD-10-CM | POA: Diagnosis not present

## 2021-05-28 DIAGNOSIS — Z9012 Acquired absence of left breast and nipple: Secondary | ICD-10-CM | POA: Diagnosis not present

## 2021-05-28 DIAGNOSIS — R197 Diarrhea, unspecified: Secondary | ICD-10-CM | POA: Diagnosis not present

## 2021-05-28 DIAGNOSIS — Z9013 Acquired absence of bilateral breasts and nipples: Secondary | ICD-10-CM | POA: Insufficient documentation

## 2021-05-28 DIAGNOSIS — Z803 Family history of malignant neoplasm of breast: Secondary | ICD-10-CM

## 2021-05-28 LAB — CBC WITH DIFFERENTIAL (CANCER CENTER ONLY)
Abs Immature Granulocytes: 0.02 10*3/uL (ref 0.00–0.07)
Basophils Absolute: 0 10*3/uL (ref 0.0–0.1)
Basophils Relative: 0 %
Eosinophils Absolute: 0.2 10*3/uL (ref 0.0–0.5)
Eosinophils Relative: 3 %
HCT: 41 % (ref 36.0–46.0)
Hemoglobin: 13.2 g/dL (ref 12.0–15.0)
Immature Granulocytes: 0 %
Lymphocytes Relative: 45 %
Lymphs Abs: 3.4 10*3/uL (ref 0.7–4.0)
MCH: 31.1 pg (ref 26.0–34.0)
MCHC: 32.2 g/dL (ref 30.0–36.0)
MCV: 96.7 fL (ref 80.0–100.0)
Monocytes Absolute: 0.8 10*3/uL (ref 0.1–1.0)
Monocytes Relative: 11 %
Neutro Abs: 3.1 10*3/uL (ref 1.7–7.7)
Neutrophils Relative %: 41 %
Platelet Count: 293 10*3/uL (ref 150–400)
RBC: 4.24 MIL/uL (ref 3.87–5.11)
RDW: 12.2 % (ref 11.5–15.5)
WBC Count: 7.5 10*3/uL (ref 4.0–10.5)
nRBC: 0 % (ref 0.0–0.2)

## 2021-05-28 LAB — CMP (CANCER CENTER ONLY)
ALT: 19 U/L (ref 0–44)
AST: 16 U/L (ref 15–41)
Albumin: 4 g/dL (ref 3.5–5.0)
Alkaline Phosphatase: 98 U/L (ref 38–126)
Anion gap: 11 (ref 5–15)
BUN: 22 mg/dL — ABNORMAL HIGH (ref 6–20)
CO2: 29 mmol/L (ref 22–32)
Calcium: 9.8 mg/dL (ref 8.9–10.3)
Chloride: 105 mmol/L (ref 98–111)
Creatinine: 1.26 mg/dL — ABNORMAL HIGH (ref 0.44–1.00)
GFR, Estimated: 50 mL/min — ABNORMAL LOW (ref >60)
Glucose, Bld: 116 mg/dL — ABNORMAL HIGH (ref 70–99)
Potassium: 4.1 mmol/L (ref 3.5–5.1)
Sodium: 145 mmol/L (ref 135–145)
Total Bilirubin: 0.5 mg/dL (ref 0.3–1.2)
Total Protein: 7 g/dL (ref 6.5–8.1)

## 2021-05-28 LAB — LACTATE DEHYDROGENASE: LDH: 180 U/L (ref 98–192)

## 2021-05-28 MED ORDER — ONDANSETRON HCL 8 MG PO TABS
8.0000 mg | ORAL_TABLET | Freq: Three times a day (TID) | ORAL | 0 refills | Status: DC | PRN
  Filled 2021-05-28: qty 20, 7d supply, fill #0

## 2021-05-28 MED ORDER — SODIUM CHLORIDE 0.9 % IV SOLN: INTRAVENOUS | Status: AC

## 2021-05-28 MED ORDER — SODIUM CHLORIDE 0.9 % IV SOLN
8.0000 mg | Freq: Once | INTRAVENOUS | Status: AC
  Administered 2021-05-28: 8 mg via INTRAVENOUS
  Filled 2021-05-28: qty 4

## 2021-05-28 MED ORDER — SODIUM CHLORIDE 0.9 % IV SOLN
10.0000 mg | Freq: Once | INTRAVENOUS | Status: AC
  Administered 2021-05-28: 10 mg via INTRAVENOUS
  Filled 2021-05-28: qty 10

## 2021-05-28 NOTE — Patient Instructions (Signed)

## 2021-05-28 NOTE — Progress Notes (Signed)
Patient is here for follow up after surgery. Followed up on her oncotype testing and found that testing had been held as her surgical pathology did not contain invasive carcinoma. Spoke to Dr Marin Olp and he would like testing completed on her initial biopsy. Updated order placed.   Due to patient feeling poorly with likely gastroenteritis, she will receive IV fluids and symptom management today. Dr Marin Olp will bring her back in one month to start AI and discuss oncotype score results.   Oncology Nurse Navigator Documentation  Oncology Nurse Navigator Flowsheets 05/28/2021  Abnormal Finding Date -  Confirmed Diagnosis Date -  Diagnosis Status -  Planned Course of Treatment -  Phase of Treatment -  Expected Surgery Date -  Surgery Actual Start Date: -  Navigator Follow Up Date: 06/28/2021  Navigator Follow Up Reason: Follow-up Appointment  Navigator Location [facility]  Referral Date to RadOnc/MedOnc -  Navigator Encounter Type Follow-up Appt;Pathology Review  Telephone -  Treatment Initiated Date -  Patient Visit Type MedOnc  Treatment Phase Active Tx  Barriers/Navigation Needs Coordination of Care;Education  Education -  Interventions Coordination of Care  Acuity Level 2-Minimal Needs (1-2 Barriers Identified)  Coordination of Care Pathology  Education Method -  Support Groups/Services Friends and Family  Time Spent with Patient 30

## 2021-05-29 ENCOUNTER — Ambulatory Visit: Payer: 59 | Attending: Plastic Surgery | Admitting: Physical Therapy

## 2021-05-29 ENCOUNTER — Encounter: Payer: Self-pay | Admitting: Physical Therapy

## 2021-05-29 DIAGNOSIS — C50411 Malignant neoplasm of upper-outer quadrant of right female breast: Secondary | ICD-10-CM

## 2021-05-29 DIAGNOSIS — Z170 Estrogen receptor positive status [ER+]: Secondary | ICD-10-CM | POA: Diagnosis not present

## 2021-05-29 DIAGNOSIS — R293 Abnormal posture: Secondary | ICD-10-CM

## 2021-05-29 DIAGNOSIS — M25612 Stiffness of left shoulder, not elsewhere classified: Secondary | ICD-10-CM

## 2021-05-29 DIAGNOSIS — Z483 Aftercare following surgery for neoplasm: Secondary | ICD-10-CM

## 2021-05-29 DIAGNOSIS — M25611 Stiffness of right shoulder, not elsewhere classified: Secondary | ICD-10-CM

## 2021-05-29 NOTE — Progress Notes (Signed)
Hematology and Oncology Follow Up Visit  Katelyn Reyes [id number] 1960-08-08 61 y.o. 05/29/2021   Principle Diagnosis:  Stage IA (T1aN0M0) infiltrating ductal carcinoma of the right breast- ER+/PR+/HER2-  Current Therapy:   Status post bilateral mastectomies     Interim History:  Katelyn Reyes is back for follow-up.  This is her second office visit.  She did have bilateral mastectomies on 04/26/2021.  The pathology report ([id number]) showed a 4 mm invasive ductal carcinoma.  It was low-grade.  1 lymph node was sampled and this was negative.  The tumor was ER positive/PR positive/HER2 negative.  The proliferation index was 5%.  We do not have the Oncotype score back yet.  However, I have to believe that this could be quite low..  She does not feel well at all this morning.  It seemed like she may have a gastroenteritis.  She has had nausea and vomiting.  She not been eating much.  She went to the ER on 05/25/2021.  She got some IV fluids.  She got some Zofran.  She had a urinalysis done that was unremarkable.  She still feels rough.  She is still having some diarrhea.  She seems to be doing well where she had mastectomies.  There is no bleeding.  She has had no fever.  There has been no leg swelling.  She has had no mouth sores.  I suspect she is probably going to need some fluids today.  Overall, I would say her performance status is ECOG 1.  Medications:  Current Outpatient Medications:    cetirizine (ZYRTEC) 10 MG tablet, TAKE 1 TABLET (10 MG TOTAL) BY MOUTH DAILY., Disp: 90 tablet, Rfl: 1   cetirizine (ZYRTEC) 10 MG tablet, Take 10 mg by mouth daily., Disp: , Rfl:    Cholecalciferol (VITAMIN D) 50 MCG (2000 UT) CAPS, Take by mouth daily., Disp: , Rfl:    fluticasone (FLONASE) 50 MCG/ACT nasal spray, PLACE 2 SPRAYS INTO BOTH NOSTRILS DAILY., Disp: 16 g, Rfl: 6   montelukast (SINGULAIR) 10 MG tablet, TAKE 1 TABLET (10 MG TOTAL) BY MOUTH DAILY., Disp: 90 tablet, Rfl: 1    pantoprazole (PROTONIX) 40 MG tablet, TAKE 1 TABLET (40 MG TOTAL) BY MOUTH AT BEDTIME., Disp: 90 tablet, Rfl: 1   promethazine (PHENERGAN) 25 MG tablet, Take 1 tablet (25 mg total) by mouth every 6 (six) hours as needed for nausea or vomiting., Disp: 10 tablet, Rfl: 0   Semaglutide, 1 MG/DOSE, (OZEMPIC, 1 MG/DOSE,) 4 MG/3ML SOPN, Inject 1 mg as directed once a week., Disp: 3 mL, Rfl: 2   sertraline (ZOLOFT) 100 MG tablet, TAKE 1 TABLET (100 MG TOTAL) BY MOUTH DAILY., Disp: 90 tablet, Rfl: 1   albuterol (VENTOLIN HFA) 108 (90 Base) MCG/ACT inhaler, INHALE 2 PUFFS INTO THE LUNGS EVERY 6 HOURS AS NEEDED FOR WHEEZING. (Patient not taking: Reported on 05/28/2021), Disp: 18 g, Rfl: 6   ALPRAZolam (XANAX) 0.5 MG tablet, Take 1 tablet (0.5 mg total) by mouth 3 (three) times daily as needed. (Patient not taking: Reported on 05/28/2021), Disp: 60 tablet, Rfl: 3   Cholecalciferol (VITAMIN D-3) 125 MCG (5000 UT) TABS, Take 5,000 Units by mouth every evening., Disp: , Rfl:    estradiol (ESTRACE) 0.1 MG/GM vaginal cream, Place vaginally., Disp: , Rfl:    estradiol (ESTRACE) 1 MG tablet, Take 1 mg by mouth every evening. , Disp: , Rfl: 0   levonorgestrel (MIRENA) 20 MCG/24HR IUD, 1 each by Intrauterine route once., Disp: , Rfl:  meloxicam (MOBIC) 15 MG tablet, meloxicam 15 mg tablet  TAKE 1 TABLET(S) BY MOUTH TWICE DAILY FOR 5 DAYS, THEN TAKE ONE TABLET BY MOUTH DAILY AS NEEDED, Disp: , Rfl:    methocarbamol (ROBAXIN) 500 MG tablet, Take 1 tablet (500 mg total) by mouth 3 (three) times daily as needed for use following surgery, Disp: 20 tablet, Rfl: 0   ondansetron (ZOFRAN) 8 MG tablet, Take 1 tablet (8 mg total) by mouth every 8 (eight) hours as needed for nausea or vomiting., Disp: 20 tablet, Rfl: 0   oxyCODONE (OXY IR/ROXICODONE) 5 MG immediate release tablet, Take 1 tablet (5 mg total) by mouth every 4 (four) hours as needed for up to 5 days. For use following surgery, Disp: 20 tablet, Rfl: 0   potassium  chloride SA (KLOR-CON M) 20 MEQ tablet, potassium chloride ER 20 mEq tablet,extended release(part/cryst), Disp: , Rfl:    progesterone (PROMETRIUM) 200 MG capsule, Take 200 mg by mouth at bedtime., Disp: , Rfl:    Sod Picosulfate-Mag Ox-Cit Acd (CLENPIQ) 10-3.5-12 MG-GM -GM/160ML SOLN, Take as directed per office and not instructions on packaging. Do not refrigerate., Disp: 320 mL, Rfl: 0   vitamin B-12 (CYANOCOBALAMIN) 1000 MCG tablet, Take 1,000 mcg by mouth every evening., Disp: , Rfl:    VITAMIN D PO, Take by mouth., Disp: , Rfl:    zolpidem (AMBIEN) 10 MG tablet, TAKE 1 TABLET (10 MG TOTAL) BY MOUTH AT BEDTIME AS NEEDED. FOR SLEEP, Disp: 30 tablet, Rfl: 3  Allergies:  Allergies  Allergen Reactions   Amoxicillin Rash and Other (See Comments)    Has patient had a PCN reaction causing immediate rash, facial/tongue/throat swelling, SOB or lightheadedness with hypotension: No Has patient had a PCN reaction causing severe rash involving mucus membranes or skin necrosis: No Has patient had a PCN reaction that required treatment: #  #  #  YES  #  #  # - MD office Has patient had a PCN reaction occurring within the last 10 years: Yes If all of the above answers are "NO", then may proceed with Cephalosporin use.  Has patient had a PCN reaction causing immediate rash, facial/tongue/throat swelling, SOB or lightheadedness with hypotension: No Has patient had a PCN reaction causing severe rash involving mucus membranes or skin necrosis: No Has patient had a PCN reaction that required treatment: #  #  #  YES  #  #  # - MD office Has patient had a PCN reaction occurring within the last 10 years: Yes If all of the above answers are "NO", then may proceed with Cephalosporin use. Has patient had a PCN reaction causing immediate rash, facial/tongue/throat swelling, SOB or lightheadedness with hypotension: No Has patient had a PCN reaction causing severe rash involving mucus membranes or skin necrosis:  No Has patient had a PCN reaction that required treatment: #  #  #  YES  #  #  # - MD office Has patient had a PCN reaction occurring within the last 10 years: Yes If all of the above answers are "NO", then may proceed with Cephalosporin use.   Erythromycin Nausea Only   Hydrocodone-Acetaminophen Other (See Comments)    Hallucinations   Hydrocodone-Acetaminophen Nausea Only and Other (See Comments)    Hallucinations Hallucinations   Penicillin G Rash   Shellfish Allergy Itching and Other (See Comments)    REACTS TO SCALLOPS REACTS TO SCALLOPS    Past Medical History, Surgical history, Social history, and Family History were reviewed and  updated.  Review of Systems: Review of Systems  Constitutional:  Positive for appetite change.  HENT:  Negative.    Eyes: Negative.   Respiratory: Negative.    Cardiovascular: Negative.   Gastrointestinal:  Positive for diarrhea and vomiting.  Endocrine: Negative.   Genitourinary: Negative.    Musculoskeletal: Negative.   Skin: Negative.   Neurological: Negative.   Hematological: Negative.   Psychiatric/Behavioral: Negative.     Physical Exam:  weight is 177 lb (80.3 kg). Her oral temperature is 98.5 F (36.9 C). Her blood pressure is 112/92 (abnormal) and her pulse is 95. Her respiration is 18 and oxygen saturation is 100%.   Wt Readings from Last 3 Encounters:  05/28/21 177 lb (80.3 kg)  05/25/21 182 lb (82.6 kg)  04/26/21 184 lb 4.9 oz (83.6 kg)    Physical Exam Vitals reviewed.  Constitutional:      Comments: Breast exam shows status post mastectomies.  The sites are healing.  She has no erythema or warmth.  There is no exudate or discharge.  She has no chest wall nodularity.  There is no axillary adenopathy bilaterally.  HENT:     Head: Normocephalic and atraumatic.  Eyes:     Pupils: Pupils are equal, round, and reactive to light.  Cardiovascular:     Rate and Rhythm: Normal rate and regular rhythm.     Heart sounds: Normal  heart sounds.  Pulmonary:     Effort: Pulmonary effort is normal.     Breath sounds: Normal breath sounds.  Abdominal:     General: Bowel sounds are normal.     Palpations: Abdomen is soft.     Comments: Abdominal exam is soft.  There is no distention.  There is some tenderness to palpation.  She has no fluid wave.  Bowel sounds are present.  There is no palpable liver or spleen tip.  Musculoskeletal:        General: No tenderness or deformity. Normal range of motion.     Cervical back: Normal range of motion.  Lymphadenopathy:     Cervical: No cervical adenopathy.  Skin:    General: Skin is warm and dry.     Findings: No erythema or rash.  Neurological:     Mental Status: She is alert and oriented to person, place, and time.  Psychiatric:        Behavior: Behavior normal.        Thought Content: Thought content normal.        Judgment: Judgment normal.     Lab Results  Component Value Date   WBC 7.5 05/28/2021   HGB 13.2 05/28/2021   HCT 41.0 05/28/2021   MCV 96.7 05/28/2021   PLT 293 05/28/2021     Chemistry      Component Value Date/Time   NA 145 05/28/2021 0805   K 4.1 05/28/2021 0805   CL 105 05/28/2021 0805   CO2 29 05/28/2021 0805   BUN 22 (H) 05/28/2021 0805   CREATININE 1.26 (H) 05/28/2021 0805   CREATININE 0.92 04/28/2017 1645      Component Value Date/Time   CALCIUM 9.8 05/28/2021 0805   ALKPHOS 98 05/28/2021 0805   AST 16 05/28/2021 0805   ALT 19 05/28/2021 0805   BILITOT 0.5 05/28/2021 0805      Impression and Plan: Ms. Margolis is a very charming 60 year old African-American female.  She is postmenopausal.  She has a very early stage ductal carcinoma of the right breast.  Again, I do  suspect that with surgery alone, her chance of cure is probably 90%.  We still do need to get a Oncotype assay on the tumor.  I need to make sure that there is no surprises.  I think that when she feels better, we will get her on an aromatase inhibitor.  I just do  not think we need to get her on 1 right now given that she does not feel all that well.  Again we will give her IV fluid in the office.  I will then send in some oral Zofran for her since this seems to work.  It just seems like she has a gastroenteritis.  I do not  know this is a viral gastroenteritis.  I would like to get her back here in about a month or so.  By then, she should be feeling a whole lot better and then we can discuss aromatase inhibitor if all looks good with the Oncotype assay.      Volanda Napoleon, MD 12/14/20224:10 PM

## 2021-05-29 NOTE — Therapy (Signed)
[facility] @ [address] [place], [place], [address] Phone: [phone]   Fax:  [phone]  Physical Therapy Evaluation  Patient Details  Name: Katelyn Reyes MRN: [id number] Date of Birth: [birth year] Referring Provider (PT): Dr. Iran Planas   Encounter Date: 05/29/2021   PT End of Session - 05/29/21 1158     Visit Number 1    Number of Visits 9    Date for PT Re-Evaluation 06/26/21    PT Start Time 1102    PT Stop Time 1150    PT Time Calculation (min) 48 min    Activity Tolerance Patient tolerated treatment well    Behavior During Therapy [facility] for tasks assessed/performed             Past Medical History:  Diagnosis Date   Allergic rhinitis    Anxiety    Arthritis    Asthma    related to seasonal allergies   Breast calcification, right 02/02/2013   Surgically excised 02/17/13 > path benign    Cancer ([place])    Depression    Family history of breast cancer 04/12/2021   Family history of ovarian cancer 04/12/2021   GERD (gastroesophageal reflux disease)    Hyperlipidemia    IUD 02/07/2010    Past Surgical History:  Procedure Laterality Date   BREAST BIOPSY Right 02/17/2013   Procedure:  NEEDLE LOCALIZATION REMOVAL RIGHT BREAST CALCIFICATIONS;  Surgeon: Haywood Lasso, MD;  Location: [facility];  Service: General;  Laterality: Right;   BREAST EXCISIONAL BIOPSY Right pt unsure   benign   BREAST RECONSTRUCTION WITH PLACEMENT OF TISSUE EXPANDER AND ALLODERM Bilateral 04/26/2021   Procedure: BREAST RECONSTRUCTION WITH PLACEMENT OF TISSUE EXPANDER AND ALLODERM;  Surgeon: Irene Limbo, MD;  Location: [facility];  Service: Plastics;  Laterality: Bilateral;   BURCH PROCEDURE N/A 12/22/2013   Procedure: BURCH CYSTO URETHROPEXY, ANTERIOR AND POSTERIOR CULPORRHAPHY;  Surgeon: Ena Dawley, MD;  Location: [facility];  Service: Gynecology;  Laterality: N/A;   CERVICAL DISC ARTHROPLASTY  N/A 11/12/2017   Procedure: ARTIFICIAL DISC REPLACEMENT CERVICAL SIX-SEVEN;  Surgeon: Ashok Pall, MD;  Location: [facility];  Service: Neurosurgery;  Laterality: N/A;  ARTIFICIAL DISC REPLACEMENT CERVICAL 6- CERVICAL 7   CHOLECYSTECTOMY N/A 10/09/2017   Procedure: LAPAROSCOPIC CHOLECYSTECTOMY WITH INTRAOPERATIVE CHOLANGIOGRAM;  Surgeon: Judeth Horn, MD;  Location: [facility];  Service: General;  Laterality: N/A;   KNEE ARTHROSCOPY WITH MEDIAL MENISECTOMY Right 05/01/2020   Procedure: KNEE ARTHROSCOPY WITH PARTIAL MEDIAL MENISECTOMY,   PATELLA  CHONDROPLASTY;  Surgeon: Paralee Cancel, MD;  Location: [facility];  Service: Orthopedics;  Laterality: Right;   MASTECTOMY W/ SENTINEL NODE BIOPSY Right 04/26/2021   Procedure: RIGHT MASTECTOMY WITH RIGHT SENTINEL LYMPH NODE BIOPSY;  Surgeon: [name], MD;  Location: [facility];  Service: General;  Laterality: Right;   MOUTH SURGERY     NASAL SINUS SURGERY     TOTAL MASTECTOMY Left 04/26/2021   Procedure: LEFT TOTAL MASTECTOMY;  Surgeon: [name], MD;  Location: [facility];  Service: General;  Laterality: Left;    There were no vitals filed for this visit.    Subjective Assessment - 05/29/21 1102     Subjective I am having some difficulty with ROM. The right side is worse.    Pertinent History R breast cancer s/p bilateral mastectomies, IDC ER+, PR+, HER2-, 0/1 SLN, expanders placed    Patient Stated Goals to regain ROM and strength  Currently in Pain? No/denies    Pain Score 0-No pain                [facility] PT Assessment - 05/29/21 0001       Assessment   Medical Diagnosis right breast cancer    Referring Provider (PT) Dr. Iran Planas    Onset Date/Surgical Date 04/26/21    Hand Dominance Right    Prior Therapy none      Precautions   Precautions Other (comment)    Precaution Comments at low risk of lymphedema      Restrictions   Weight Bearing Restrictions No      Balance  Screen   Has the patient fallen in the past 6 months No    Has the patient had a decrease in activity level because of a fear of falling?  No    Is the patient reluctant to leave their home because of a fear of falling?  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, plan is to undergo reconstruction on 2/7    Leisure pt walks in parking lot at her townhouse for 10 min a few times a day      Cognition   Overall Cognitive Status Within Functional Limits for tasks assessed      Observation/Other Assessments   Observations healing bilateral mastectomy scars with some dehissence at T junction bilaterally, cording in R axilla extending down upper UE, crepitus present during ROM of bilat shoulders      Posture/Postural Control   Posture/Postural Control Postural limitations    Postural Limitations Rounded Shoulders;Forward head      AROM   Right Shoulder Extension 30 Degrees    Right Shoulder Flexion 56 Degrees    Right Shoulder ABduction 67 Degrees    Right Shoulder Internal Rotation --   not measured secondary to pain   Right Shoulder External Rotation --   not measured secondary to pain   Left Shoulder Extension 55 Degrees    Left Shoulder Flexion 151 Degrees    Left Shoulder ABduction 165 Degrees    Left Shoulder Internal Rotation 68 Degrees    Left Shoulder External Rotation 69 Degrees               LYMPHEDEMA/ONCOLOGY QUESTIONNAIRE - 05/29/21 0001       Type   Cancer Type right breast cancer      Lymphedema Assessments   Lymphedema Assessments Upper extremities      Right Upper Extremity Lymphedema   15 cm Proximal to Olecranon Process 32.6 cm    Olecranon Process 26.6 cm    15 cm Proximal to Ulnar  Styloid Process 24.5 cm    Just Proximal to Ulnar Styloid Process 18 cm    Across Hand at [employer] 19 cm    At [place] of 2nd Digit 6.5 cm      Left Upper Extremity Lymphedema   15 cm Proximal to Olecranon Process 33 cm    Olecranon Process 27 cm    15 cm Proximal to Ulnar Styloid Process 24.7 cm    Just Proximal to Ulnar Styloid Process 17.5  cm    Across Hand at [employer] 19 cm    At [place] of 2nd Digit 6.5 cm                     Objective measurements completed on examination: See above findings.                PT Education - 05/29/21 1158     Education Details anatomy and physiology of the lymphatic system, lymphedema risk reduction practices, cording    Person(s) Educated Patient    Methods Explanation;Handout    Comprehension Verbalized understanding                 PT Long Term Goals - 05/29/21 1200       PT LONG TERM GOAL #1   Title Pt will not demonstrate any cording in RUE to allow more comfort with ROM    Time 4    Period Weeks    Status New    Target Date 06/26/21      PT LONG TERM GOAL #2   Title Pt will demonstrate 165 degrees of left shoulder flexion to allow her to reach overhead.    Baseline 56    Time 4    Period Weeks    Status New    Target Date 06/26/21      PT LONG TERM GOAL #3   Title Pt will demonstrate 165 degrees of left shoulder abduction to allow her to reach out to the side    Baseline 67    Time 4    Period Weeks    Status New    Target Date 06/26/21      PT LONG TERM GOAL #4   Title Pt will be independent in a home exercise program for continued stretching and strengthening    Time 4    Period Weeks    Status New    Target Date 06/26/21                    Plan - 05/29/21 1203     Clinical Impression Statement Pt presents to PT after recently undergoing bilateral mastectomies for treatment of right breast cancer with SLNB (0/1). Pt has expanders placed and plans to undergo  reconstruction in Feb. 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 She has very limited R shoulder ROM due to cording in her R axilla extending down to upper arm. Therapist performed PROM to R shoulder which greatly improved ROM. Cording was not palpable by end of session. Pt still has 2 wounds healing- one on either side at T junction which doctor is aware of. Was careful to ensure that this area was not pulled duriing ROM. Pt would benefit from skilled PT services to improve bilateral shoulder ROM (R worse than L), decrease cording, decrease pain and instruct pt in a home exercise program.    Examination-Activity Limitations Reach Overhead;Carry;Lift    Examination-Participation Restrictions Occupation;Yard Work    Stability/Clinical Decision Making Stable/Uncomplicated    Designer, jewellery Low    Rehab Potential Good    PT Frequency 2x / week    PT Duration 4 weeks    PT Treatment/Interventions ADLs/Self Care Home Management;Therapeutic exercise;Patient/family education;Manual techniques;Manual lymph drainage;Scar mobilization;Passive range of motion;Taping    PT Next Visit Plan PROM bilateral shoulders, pulleys, ball, supine dowel, re measure ROM, measure pt for prophylactic sleeve    PT Home Exercise  Plan post op breast exercises    Consulted and Agree with Plan of Care Patient             Patient will benefit from skilled therapeutic intervention in order to improve the following deficits and impairments:  Pain, Postural dysfunction, Impaired UE functional use, Impaired flexibility, Increased fascial restricitons, Decreased strength, Decreased range of motion, Decreased scar mobility  Visit Diagnosis: Stiffness of right shoulder, not elsewhere classified  Aftercare following surgery for neoplasm  Stiffness of left shoulder, not elsewhere classified  Abnormal posture  Malignant neoplasm of upper-outer quadrant of right breast in female,  estrogen receptor positive ([place])     Problem List Patient Active Problem List   Diagnosis Date Noted   Breast cancer, right ([place]) 04/26/2021   Genetic testing 04/19/2021   Family history of breast cancer 04/12/2021   Malignant neoplasm of upper outer quadrant of female breast ([place]) 04/12/2021   Family history of colon cancer 04/12/2021   Family history of ovarian cancer 48/25/0037   Metabolic syndrome 04/88/8916   Change in bowel habit 07/17/2020   Diverticular disease of colon 07/17/2020   Family history of malignant neoplasm of gastrointestinal tract 07/17/2020   Fatty liver 07/17/2020   Morbid obesity ([place]) 07/17/2020   Nausea 94/50/3888   Nonalcoholic steatohepatitis ([facility]) 07/17/2020   Personal history of colonic polyps 07/17/2020   Epigastric pain 07/17/2020   Acute medial meniscal tear, right, subsequent encounter 05/01/2020   ETD (Eustachian tube dysfunction), bilateral 12/27/2019   Obesity (BMI 30-39.9) 07/12/2018   Cervical spondylosis with radiculopathy 11/12/2017   Vitamin D deficiency 07/07/2017   Urinary incontinence in female 12/22/2013   Asthma, mild intermittent 03/22/2013   Vaginal itching 06/01/2012   Thyromegaly 03/18/2012   General medical examination 03/14/2011   CHICKENPOX, HX OF 02/15/2010   Hyperlipidemia 02/14/2009   DEPRESSION 02/14/2009   ALLERGIC RHINITIS 02/14/2009    Manus Gunning, PT 05/29/2021, 12:08 PM  [facility] @ [address] [place], [place], [address] Phone: [phone]   Fax:  [phone]  Name: Katelyn Reyes MRN: [id number] Date of Birth: Apr 12, 1961

## 2021-05-30 ENCOUNTER — Telehealth: Payer: Self-pay | Admitting: *Deleted

## 2021-05-30 NOTE — Telephone Encounter (Signed)
Call placed to patient to check on her status and to see how she is feeling per order of Dr. Marin Olp.  No answer and no voicemail. Dr. Marin Olp notified.

## 2021-05-31 ENCOUNTER — Emergency Department (HOSPITAL_BASED_OUTPATIENT_CLINIC_OR_DEPARTMENT_OTHER)
Admission: EM | Admit: 2021-05-31 | Discharge: 2021-05-31 | Disposition: A | Discharge status: Home / Self Care | Payer: 59 | Attending: Emergency Medicine | Admitting: Emergency Medicine

## 2021-05-31 ENCOUNTER — Other Ambulatory Visit (HOSPITAL_COMMUNITY): Payer: Self-pay

## 2021-05-31 ENCOUNTER — Encounter (HOSPITAL_BASED_OUTPATIENT_CLINIC_OR_DEPARTMENT_OTHER): Payer: Self-pay

## 2021-05-31 ENCOUNTER — Telehealth: Payer: 59 | Admitting: Family Medicine

## 2021-05-31 ENCOUNTER — Emergency Department (HOSPITAL_BASED_OUTPATIENT_CLINIC_OR_DEPARTMENT_OTHER): Payer: 59

## 2021-05-31 ENCOUNTER — Encounter: Payer: Self-pay | Admitting: Family Medicine

## 2021-05-31 ENCOUNTER — Other Ambulatory Visit (HOSPITAL_BASED_OUTPATIENT_CLINIC_OR_DEPARTMENT_OTHER): Payer: Self-pay

## 2021-05-31 ENCOUNTER — Other Ambulatory Visit: Payer: Self-pay

## 2021-05-31 VITALS — BP 110/80 | HR 83 | Temp 97.0°F | Ht 62.0 in | Wt 177.0 lb

## 2021-05-31 DIAGNOSIS — R109 Unspecified abdominal pain: Secondary | ICD-10-CM | POA: Diagnosis not present

## 2021-05-31 DIAGNOSIS — R197 Diarrhea, unspecified: Secondary | ICD-10-CM | POA: Diagnosis not present

## 2021-05-31 DIAGNOSIS — J4520 Mild intermittent asthma, uncomplicated: Secondary | ICD-10-CM | POA: Diagnosis not present

## 2021-05-31 DIAGNOSIS — E860 Dehydration: Secondary | ICD-10-CM | POA: Diagnosis not present

## 2021-05-31 DIAGNOSIS — R1084 Generalized abdominal pain: Secondary | ICD-10-CM

## 2021-05-31 DIAGNOSIS — R112 Nausea with vomiting, unspecified: Secondary | ICD-10-CM | POA: Insufficient documentation

## 2021-05-31 DIAGNOSIS — Z853 Personal history of malignant neoplasm of breast: Secondary | ICD-10-CM | POA: Diagnosis not present

## 2021-05-31 DIAGNOSIS — R1110 Vomiting, unspecified: Secondary | ICD-10-CM | POA: Diagnosis not present

## 2021-05-31 DIAGNOSIS — R1013 Epigastric pain: Secondary | ICD-10-CM | POA: Insufficient documentation

## 2021-05-31 LAB — COMPREHENSIVE METABOLIC PANEL
ALT: 172 U/L — ABNORMAL HIGH (ref 0–44)
AST: 99 U/L — ABNORMAL HIGH (ref 15–41)
Albumin: 3.9 g/dL (ref 3.5–5.0)
Alkaline Phosphatase: 140 U/L — ABNORMAL HIGH (ref 38–126)
Anion gap: 7 (ref 5–15)
BUN: 17 mg/dL (ref 6–20)
CO2: 29 mmol/L (ref 22–32)
Calcium: 9.3 mg/dL (ref 8.9–10.3)
Chloride: 103 mmol/L (ref 98–111)
Creatinine, Ser: 1.3 mg/dL — ABNORMAL HIGH (ref 0.44–1.00)
GFR, Estimated: 48 mL/min — ABNORMAL LOW (ref >60)
Glucose, Bld: 99 mg/dL (ref 70–99)
Potassium: 3.6 mmol/L (ref 3.5–5.1)
Sodium: 139 mmol/L (ref 135–145)
Total Bilirubin: 0.5 mg/dL (ref 0.3–1.2)
Total Protein: 6.8 g/dL (ref 6.5–8.1)

## 2021-05-31 LAB — CBC WITH DIFFERENTIAL/PLATELET
Abs Immature Granulocytes: 0.1 10*3/uL — ABNORMAL HIGH (ref 0.00–0.07)
Basophils Absolute: 0 10*3/uL (ref 0.0–0.1)
Basophils Relative: 0 %
Eosinophils Absolute: 0.4 10*3/uL (ref 0.0–0.5)
Eosinophils Relative: 6 %
HCT: 41.7 % (ref 36.0–46.0)
Hemoglobin: 13.7 g/dL (ref 12.0–15.0)
Immature Granulocytes: 1 %
Lymphocytes Relative: 41 %
Lymphs Abs: 3.2 10*3/uL (ref 0.7–4.0)
MCH: 31.1 pg (ref 26.0–34.0)
MCHC: 32.9 g/dL (ref 30.0–36.0)
MCV: 94.6 fL (ref 80.0–100.0)
Monocytes Absolute: 0.6 10*3/uL (ref 0.1–1.0)
Monocytes Relative: 7 %
Neutro Abs: 3.4 10*3/uL (ref 1.7–7.7)
Neutrophils Relative %: 45 %
Platelets: 274 10*3/uL (ref 150–400)
RBC: 4.41 MIL/uL (ref 3.87–5.11)
RDW: 12.4 % (ref 11.5–15.5)
WBC: 7.8 10*3/uL (ref 4.0–10.5)
nRBC: 0 % (ref 0.0–0.2)

## 2021-05-31 LAB — URINALYSIS, ROUTINE W REFLEX MICROSCOPIC
Bilirubin Urine: NEGATIVE
Glucose, UA: NEGATIVE mg/dL
Hgb urine dipstick: NEGATIVE
Ketones, ur: NEGATIVE mg/dL
Leukocytes,Ua: NEGATIVE
Nitrite: NEGATIVE
Protein, ur: NEGATIVE mg/dL
Specific Gravity, Urine: 1.024 (ref 1.005–1.030)
pH: 6.5 (ref 5.0–8.0)

## 2021-05-31 LAB — GASTROINTESTINAL PANEL BY PCR, STOOL (REPLACES STOOL CULTURE)

## 2021-05-31 LAB — LIPASE, BLOOD: Lipase: 17 U/L (ref 11–51)

## 2021-05-31 MED ORDER — SODIUM CHLORIDE 0.9 % IV SOLN: INTRAVENOUS | Status: DC

## 2021-05-31 MED ORDER — IOHEXOL 300 MG/ML  SOLN
100.0000 mL | Freq: Once | INTRAMUSCULAR | Status: AC | PRN
  Administered 2021-05-31: 100 mL via INTRAVENOUS

## 2021-05-31 MED ORDER — METRONIDAZOLE 500 MG PO TABS
500.0000 mg | ORAL_TABLET | Freq: Three times a day (TID) | ORAL | 0 refills | Status: DC
  Filled 2021-05-31: qty 21, 7d supply, fill #0

## 2021-05-31 MED ORDER — SODIUM CHLORIDE 0.9 % IV BOLUS
1000.0000 mL | Freq: Once | INTRAVENOUS | Status: AC
  Administered 2021-05-31: 1000 mL via INTRAVENOUS

## 2021-05-31 MED ORDER — MORPHINE SULFATE (PF) 4 MG/ML IV SOLN
4.0000 mg | Freq: Once | INTRAVENOUS | Status: AC
  Administered 2021-05-31: 4 mg via INTRAVENOUS
  Filled 2021-05-31: qty 1

## 2021-05-31 MED ORDER — KETOROLAC TROMETHAMINE 30 MG/ML IJ SOLN
30.0000 mg | Freq: Once | INTRAMUSCULAR | Status: AC
  Administered 2021-05-31: 30 mg via INTRAMUSCULAR
  Filled 2021-05-31: qty 1

## 2021-05-31 MED ORDER — KETOROLAC TROMETHAMINE 15 MG/ML IJ SOLN
15.0000 mg | Freq: Once | INTRAMUSCULAR | Status: AC
  Administered 2021-05-31: 15 mg via INTRAVENOUS
  Filled 2021-05-31: qty 1

## 2021-05-31 MED ORDER — CIPROFLOXACIN HCL 500 MG PO TABS
500.0000 mg | ORAL_TABLET | Freq: Two times a day (BID) | ORAL | 0 refills | Status: DC
  Filled 2021-05-31: qty 10, 5d supply, fill #0

## 2021-05-31 MED ORDER — ONDANSETRON HCL 4 MG/2ML IJ SOLN
4.0000 mg | Freq: Once | INTRAMUSCULAR | Status: AC
  Administered 2021-05-31: 4 mg via INTRAVENOUS
  Filled 2021-05-31: qty 2

## 2021-05-31 NOTE — ED Notes (Signed)
Patient transported to CT 

## 2021-05-31 NOTE — ED Provider Notes (Signed)
[facility] EMERGENCY DEPT Provider Note   CSN: [id number] Arrival date & time: 05/31/21  1205     History Chief Complaint  Patient presents with   Diarrhea    Katelyn Reyes is a 60 y.o. female with a past medical history of GERD, hyperlipidemia and double mastectomy performed last month presenting today with complaint of nausea, vomiting and diarrhea for the past 7 days.  She states that she cannot keep anything down without vomiting or have any immediate diarrhea.  No known sick contacts.  Denies any fevers or chills.  No hematemesis or melena.  Describes her diarrhea as watery and occasionally fatty.  Primary care provider requesting a stool sample.  Was on Bactrim last month after her double mastectomy however has not been on this for nearly 4 weeks.  Localizes the majority of the abdominal pain to the epigastrium and has some burning down her esophagus.  Zofran helps her however she has not taken any recently.  She does have a history of a cholecystectomy.   Past Medical History:  Diagnosis Date   Allergic rhinitis    Anxiety    Arthritis    Asthma    related to seasonal allergies   Breast calcification, right 02/02/2013   Surgically excised 02/17/13 > path benign    Cancer ([place])    Depression    Family history of breast cancer 04/12/2021   Family history of ovarian cancer 04/12/2021   GERD (gastroesophageal reflux disease)    Hyperlipidemia    IUD 02/07/2010    Patient Active Problem List   Diagnosis Date Noted   Breast cancer, right ([place]) 04/26/2021   Genetic testing 04/19/2021   Family history of breast cancer 04/12/2021   Malignant neoplasm of upper outer quadrant of female breast ([place]) 04/12/2021   Family history of colon cancer 04/12/2021   Family history of ovarian cancer 76/16/0737   Metabolic syndrome 10/62/6948   Change in bowel habit 07/17/2020   Diverticular disease of colon 07/17/2020   Family history of malignant neoplasm of  gastrointestinal tract 07/17/2020   Fatty liver 07/17/2020   Morbid obesity ([place]) 07/17/2020   Nausea 54/62/7035   Nonalcoholic steatohepatitis (NASH) 07/17/2020   Personal history of colonic polyps 07/17/2020   Epigastric pain 07/17/2020   Acute medial meniscal tear, right, subsequent encounter 05/01/2020   ETD (Eustachian tube dysfunction), bilateral 12/27/2019   Obesity (BMI 30-39.9) 07/12/2018   Cervical spondylosis with radiculopathy 11/12/2017   Vitamin D deficiency 07/07/2017   Urinary incontinence in female 12/22/2013   Asthma, mild intermittent 03/22/2013   Vaginal itching 06/01/2012   Thyromegaly 03/18/2012   General medical examination 03/14/2011   CHICKENPOX, HX OF 02/15/2010   Hyperlipidemia 02/14/2009   DEPRESSION 02/14/2009   ALLERGIC RHINITIS 02/14/2009    Past Surgical History:  Procedure Laterality Date   BREAST BIOPSY Right 02/17/2013   Procedure:  NEEDLE LOCALIZATION REMOVAL RIGHT BREAST CALCIFICATIONS;  Surgeon: Haywood Lasso, MD;  Location: [facility];  Service: General;  Laterality: Right;   BREAST EXCISIONAL BIOPSY Right pt unsure   benign   BREAST RECONSTRUCTION WITH PLACEMENT OF TISSUE EXPANDER AND ALLODERM Bilateral 04/26/2021   Procedure: BREAST RECONSTRUCTION WITH PLACEMENT OF TISSUE EXPANDER AND ALLODERM;  Surgeon: Irene Limbo, MD;  Location: [facility];  Service: Plastics;  Laterality: Bilateral;   BURCH PROCEDURE N/A 12/22/2013   Procedure: BURCH CYSTO URETHROPEXY, ANTERIOR AND POSTERIOR CULPORRHAPHY;  Surgeon: Ena Dawley, MD;  Location: [facility];  Service: Gynecology;  Laterality:  N/A;   CERVICAL DISC ARTHROPLASTY N/A 11/12/2017   Procedure: ARTIFICIAL DISC REPLACEMENT CERVICAL SIX-SEVEN;  Surgeon: Ashok Pall, MD;  Location: [facility];  Service: Neurosurgery;  Laterality: N/A;  ARTIFICIAL DISC REPLACEMENT CERVICAL 6- CERVICAL 7   CHOLECYSTECTOMY N/A 10/09/2017   Procedure: LAPAROSCOPIC CHOLECYSTECTOMY WITH  INTRAOPERATIVE CHOLANGIOGRAM;  Surgeon: Judeth Horn, MD;  Location: [facility];  Service: General;  Laterality: N/A;   KNEE ARTHROSCOPY WITH MEDIAL MENISECTOMY Right 05/01/2020   Procedure: KNEE ARTHROSCOPY WITH PARTIAL MEDIAL MENISECTOMY,   PATELLA  CHONDROPLASTY;  Surgeon: Paralee Cancel, MD;  Location: [facility];  Service: Orthopedics;  Laterality: Right;   MASTECTOMY W/ SENTINEL NODE BIOPSY Right 04/26/2021   Procedure: RIGHT MASTECTOMY WITH RIGHT SENTINEL LYMPH NODE BIOPSY;  Surgeon: [name], MD;  Location: [facility];  Service: General;  Laterality: Right;   MOUTH SURGERY     NASAL SINUS SURGERY     TOTAL MASTECTOMY Left 04/26/2021   Procedure: LEFT TOTAL MASTECTOMY;  Surgeon: [name], MD;  Location: [facility];  Service: General;  Laterality: Left;     OB History   No obstetric history on file.     Family History  Problem Relation Age of Onset   Fibrocystic breast disease Mother    Hypertension Father    Diabetes Father    Breast cancer Maternal Aunt        dx 95s   Colon cancer Paternal Aunt        x2 pat aunts, dx after 83   Stroke Maternal Grandmother    Cancer Other        ovarian/GYN; MGF's mother   Colon cancer Other        MGM's father; dx after 37   Coronary artery disease Neg Hx     Social History   Tobacco Use   Smoking status: Never   Smokeless tobacco: Never  Vaping Use   Vaping Use: Never used  Substance Use Topics   Alcohol use: Yes    Comment: social   Drug use: No    Home Medications Prior to Admission medications   Medication Sig Start Date End Date Taking? Authorizing Provider  albuterol (VENTOLIN HFA) 108 (90 Base) MCG/ACT inhaler INHALE 2 PUFFS INTO THE LUNGS EVERY 6 HOURS AS NEEDED FOR WHEEZING. 07/17/20 07/17/21  Midge Minium, MD  ALPRAZolam Duanne Moron) 0.5 MG tablet Take 1 tablet (0.5 mg total) by mouth 3 (three) times daily as needed. 03/21/21   Midge Minium, MD   cetirizine (ZYRTEC) 10 MG tablet TAKE 1 TABLET (10 MG TOTAL) BY MOUTH DAILY. 07/17/20 07/17/21  Midge Minium, MD  Cholecalciferol (VITAMIN D) 50 MCG (2000 UT) CAPS Take by mouth daily.    [provider]  Cholecalciferol (VITAMIN D-3) 125 MCG (5000 UT) TABS Take 5,000 Units by mouth every evening.    [provider]  estradiol (ESTRACE) 0.1 MG/GM vaginal cream Place vaginally.    [provider]  estradiol (ESTRACE) 1 MG tablet Take 1 mg by mouth every evening.  Patient not taking: Reported on 05/31/2021 10/08/15   [provider]  fluticasone (FLONASE) 50 MCG/ACT nasal spray PLACE 2 SPRAYS INTO BOTH NOSTRILS DAILY. 07/17/20 07/17/21  Midge Minium, MD  levonorgestrel (MIRENA) 20 MCG/24HR IUD 1 each by Intrauterine route once. Patient not taking: Reported on 05/31/2021    [provider]  meloxicam (MOBIC) 15 MG tablet meloxicam 15 mg tablet  TAKE 1 TABLET(S) BY MOUTH TWICE DAILY FOR  5 DAYS, THEN TAKE ONE TABLET BY MOUTH DAILY AS NEEDED Patient not taking: Reported on 05/31/2021    [provider]  methocarbamol (ROBAXIN) 500 MG tablet Take 1 tablet (500 mg total) by mouth 3 (three) times daily as needed for use following surgery Patient not taking: Reported on 05/31/2021 04/16/21     montelukast (SINGULAIR) 10 MG tablet TAKE 1 TABLET (10 MG TOTAL) BY MOUTH DAILY. 07/17/20 07/17/21  Midge Minium, MD  ondansetron (ZOFRAN) 8 MG tablet Take 1 tablet (8 mg total) by mouth every 8 (eight) hours as needed for nausea or vomiting. 05/28/21   Volanda Napoleon, MD  oxyCODONE (OXY IR/ROXICODONE) 5 MG immediate release tablet Take 1 tablet (5 mg total) by mouth every 4 (four) hours as needed for up to 5 days. For use following surgery Patient not taking: Reported on 05/31/2021 04/16/21     pantoprazole (PROTONIX) 40 MG tablet TAKE 1 TABLET (40 MG TOTAL) BY MOUTH AT BEDTIME. 07/17/20 07/17/21  Midge Minium, MD  potassium chloride SA (KLOR-CON M)  20 MEQ tablet potassium chloride ER 20 mEq tablet,extended release(part/cryst) Patient not taking: Reported on 05/31/2021    [provider]  progesterone (PROMETRIUM) 200 MG capsule Take 200 mg by mouth at bedtime. Patient not taking: Reported on 05/31/2021 03/21/20   [provider]  promethazine (PHENERGAN) 25 MG tablet Take 1 tablet (25 mg total) by mouth every 6 (six) hours as needed for nausea or vomiting. Patient not taking: Reported on 05/31/2021 05/25/21   Sherwood Gambler, MD  Semaglutide, 1 MG/DOSE, (OZEMPIC, 1 MG/DOSE,) 4 MG/3ML SOPN Inject 1 mg as directed once a week. 04/25/21   Maximiano Coss, NP  sertraline (ZOLOFT) 100 MG tablet TAKE 1 TABLET (100 MG TOTAL) BY MOUTH DAILY. 07/17/20 07/17/21  Midge Minium, MD  Sod Picosulfate-Mag Ox-Cit Acd [facility]) 10-3.5-12 MG-GM -GM/160ML SOLN Take as directed per office and not instructions on packaging. Do not refrigerate. Patient not taking: Reported on 05/31/2021 05/23/21     vitamin B-12 (CYANOCOBALAMIN) 1000 MCG tablet Take 1,000 mcg by mouth every evening. Patient not taking: Reported on 05/31/2021    [provider]  VITAMIN D PO Take by mouth.    [provider]  zolpidem (AMBIEN) 10 MG tablet TAKE 1 TABLET (10 MG TOTAL) BY MOUTH AT BEDTIME AS NEEDED. FOR SLEEP 07/17/20 04/26/21  Midge Minium, MD    Allergies    Amoxicillin, Erythromycin, Hydrocodone-acetaminophen, Hydrocodone-acetaminophen, Penicillin g, and Shellfish allergy  Review of Systems   Review of Systems  Constitutional:  Negative for chills and fever.  Gastrointestinal:  Positive for abdominal pain, diarrhea, nausea and vomiting. Negative for blood in stool.  Genitourinary:  Negative for dysuria and hematuria.  Musculoskeletal:  Negative for myalgias.  Neurological:  Positive for weakness. Negative for syncope.  All other systems reviewed and are negative.  Physical Exam Updated Vital Signs BP (!) 103/42 (BP Location:  Left Arm)    Pulse 74    Temp 98 F (36.7 C) (Oral)    Resp 15    Ht 5\' 2"  (1.575 m)    Wt 80.3 kg    SpO2 98%    BMI 32.38 kg/m   Physical Exam Vitals and nursing note reviewed.  Constitutional:      Appearance: Normal appearance. She is not ill-appearing.  HENT:     Head: Normocephalic and atraumatic.  Eyes:     General: No scleral icterus.    Conjunctiva/sclera: Conjunctivae normal.  Cardiovascular:  Rate and Rhythm: Normal rate and regular rhythm.  Pulmonary:     Effort: Pulmonary effort is normal. No respiratory distress.  Abdominal:     General: Abdomen is flat.     Palpations: Abdomen is soft.     Tenderness: There is abdominal tenderness (Epigastrium).     Hernia: No hernia is present.  Skin:    General: Skin is warm and dry.     Findings: No rash.  Neurological:     Mental Status: She is alert.  Psychiatric:        Mood and Affect: Mood normal.    ED Results / Procedures / Treatments   Labs (all labs ordered are listed, but only abnormal results are displayed) Labs Reviewed  CBC WITH DIFFERENTIAL/PLATELET - Abnormal; Notable for the following components:      Result Value   Abs Immature Granulocytes 0.10 (*)    All other components within normal limits  GASTROINTESTINAL PANEL BY PCR, STOOL (REPLACES STOOL CULTURE)  COMPREHENSIVE METABOLIC PANEL  LIPASE, BLOOD  URINALYSIS, ROUTINE W REFLEX MICROSCOPIC    EKG None  Radiology CT ABDOMEN PELVIS W CONTRAST  Result Date: 05/31/2021 CLINICAL DATA:  Abdominal pain, nausea, vomiting, and diarrhea for 1 week. EXAM: CT ABDOMEN AND PELVIS WITH CONTRAST TECHNIQUE: Multidetector CT imaging of the abdomen and pelvis was performed using the standard protocol following bolus administration of intravenous contrast. CONTRAST:  173mL OMNIPAQUE IOHEXOL 300 MG/ML  SOLN COMPARISON:  CT abdomen and pelvis dated October 05, 2017. FINDINGS: Lower chest: No acute abnormality. Hepatobiliary: No focal liver abnormality is seen.  Status post cholecystectomy. No biliary dilatation. Pancreas: Unremarkable. No pancreatic ductal dilatation or surrounding inflammatory changes. Spleen: Normal in size without focal abnormality. Adrenals/Urinary Tract: Adrenal glands are unremarkable. Kidneys are normal, without renal calculi, focal lesion, or hydronephrosis. Bladder is unremarkable. Stomach/Bowel: Stomach is within normal limits. Normal fluid-filled appendix. No evidence of bowel wall thickening, distention, or inflammatory changes. Liquid stool throughout the colon. Vascular/Lymphatic: No significant vascular findings are present. No enlarged abdominal or pelvic lymph nodes. Reproductive: Uterus and bilateral adnexa are unremarkable. Other: No abdominal wall hernia or abnormality. No abdominopelvic ascites. No pneumoperitoneum. Musculoskeletal: No acute or significant osseous findings. IMPRESSION: 1. No acute intra-abdominal process. 2. Liquid stool throughout the colon, consistent with diarrheal illness. Electronically Signed   By: Titus Dubin M.D.   On: 05/31/2021 15:31    Procedures Procedures   Medications Ordered in ED Medications  sodium chloride 0.9 % bolus 1,000 mL (0 mLs Intravenous Stopped 05/31/21 1533)    And  0.9 %  sodium chloride infusion ( Intravenous New Bag/Given 05/31/21 1534)  ondansetron (ZOFRAN) injection 4 mg (4 mg Intravenous Given 05/31/21 1408)  ketorolac (TORADOL) 15 MG/ML injection 15 mg (15 mg Intravenous Given 05/31/21 1409)  iohexol (OMNIPAQUE) 300 MG/ML solution 100 mL (100 mLs Intravenous Contrast Given 05/31/21 1511)  morphine 4 MG/ML injection 4 mg (4 mg Intravenous Given 05/31/21 1545)    ED Course  I have reviewed the triage vital signs and the nursing notes.  Pertinent labs & imaging results that were available during my care of the patient were reviewed by me and considered in my medical decision making (see chart for details).    MDM Rules/Calculators/A&P 60 year old woman with  past medical history of breast cancer status post double mastectomy presenting today with complaint of nausea, vomiting and diarrhea for a week.  No recent travel or antibiotic use.  Was seen by primary care who requests that we  collect a stool sample.  Work-up: -Liver enzymes elevated at this time.  No history of excessive Tylenol or alcohol use. -CT scan shows fluid in the colon, consistent with diarrhea.  No other abnormalities. -Patient given fluids due to poor p.o. intake and diarrhea.  Pain was controlled with Toradol, she reported her dose of morphine did not help.   Conclusion: At this time, due to the length of her symptoms it is reasonable to treat with antibiotics.  This decision was made with MD [place].  She will be put on ciprofloxacin as well and is metronidazole to cover infectious causes of diarrhea.  Stool sample is not back and she has been notified that this will not result while she is in the department today.  Patient requesting admission.  We discussed that there is no grounds to admit her if she is able to tolerate a p.o. test.  Was able to tolerate p.o. challenge and to be discharged home.  Has a colonoscopy scheduled with her GI doctor on Monday.  She may follow-up with them as well as her primary care provider.  Final Clinical Impression(s) / ED Diagnoses Final diagnoses:  Diarrhea of presumed infectious origin    Rx / DC Orders Results and diagnoses were explained to the patient. Return precautions discussed in full. Patient had no additional questions and expressed complete understanding.     Darliss Ridgel 05/31/21 1632    Isla Pence, MD 05/31/21 [phone]

## 2021-05-31 NOTE — ED Triage Notes (Signed)
Patient here POV from Home with Diarrhea.  Patient states she was seen recently for this and Studies were normal. Patient was sent Home with Zofran which has made Nausea less prevalent.  Patient was at PCP Office today and felt lightheaded as she has not been retaining PO Intake recently. Patient also still having ABD Pain.  NAD Noted during Triage. A&Ox4. GCS 15. BIB Wheelchair.

## 2021-05-31 NOTE — Discharge Instructions (Addendum)
Your antibiotics are at the pharmacy.  I have attached information about good food choices for the next few days to these discharge papers.  Also please remember to follow-up with your primary care provider about your elevated liver enzymes.  Continue to follow your illness with your gastroenterologist.

## 2021-05-31 NOTE — Progress Notes (Signed)
Patient felt dizzy prior to having labs, labs were not completed. Dr.Andy recommended patient be seen in the ED. Patient has a friend coming to pick her up and take her.  Rechecked patients vitals, patient is stable.  BP 110/80 (patient says this is her normal) 83 pulse, 94 o2

## 2021-05-31 NOTE — ED Notes (Signed)
Pt tolerating po fluids well  

## 2021-05-31 NOTE — Progress Notes (Signed)
Subjective:      Subjective  CC:  Chief Complaint  Patient presents with   Diarrhea    Ongoing for 6 days   Emesis   Abdominal Pain   Same day acute visit; PCP not available. New pt to me. Chart reviewed.   HPI: Katelyn Reyes is a 60 y.o. female who presents for the above.(Initially started as telehealth visit but asked to come in person so she could be examined). 60 year old with recent diagnosis of breast cancer status post bilateral mastectomy was recently evaluated on December 10 for nausea diarrhea that came on acutely.  I reviewed emergency room work-up and evaluation.  She was tentatively diagnosed with gastroenteritis, treated with IV fluids and multiple antiemetics and discharge.  CBC and CMP at that time were mostly unremarkable.  She had mild hypokalemia that was supplemented.  Stool studies were not done at that time.  There was no abdominal imaging done.  Her symptoms did improve over the course of her ER visit.  She was discharged and has been taking otc medication for the diarrhea.  Unfortunately the diarrhea persists.  She is no longer vomiting.  She was seen by her oncologist on Tuesday of this week and was dehydrated.  Lab work at that time showed a normal CBC, mildly elevated BUN and creatinine and normal potassium.  She was treated with IV fluids.  She continues to have loose frequent stools with some fecal incontinence.  She denies blood in the stool but does admit to mucus at times.  She has diffuse abdominal cramping that has lessened over the week.  No localized pain.  She does describe some epigastric pain.  She is scheduled to have a colonoscopy for surveillance of colon polyps done next week.  She has a history of diverticular disease of the colon but denies history of diverticulitis.  No fevers.  No history of inflammatory bowel disease.her PO intake has been poor: trying to drink fluids but "gets full" so only taking sips of fluid.  She denies lightheadedness, chest  pain or palpitations.  Her appetite is down but she is drinking plenty of fluid.  No dysuria.  She does take Ozempic for weight loss and she is wondering if the symptoms are caused by that.She has had antibiotics within the last 2 months. During visit, pt became lightheaded and woozy. Too weak to walk.   I reviewed the patients updated PMH, FH, and SocHx.    Patient Active Problem List   Diagnosis Date Noted   Breast cancer, right ([place]) 04/26/2021   Genetic testing 04/19/2021   Family history of breast cancer 04/12/2021   Malignant neoplasm of upper outer quadrant of female breast ([place]) 04/12/2021   Family history of colon cancer 04/12/2021   Family history of ovarian cancer 27/11/2374   Metabolic syndrome 28/31/5176   Change in bowel habit 07/17/2020   Diverticular disease of colon 07/17/2020   Family history of malignant neoplasm of gastrointestinal tract 07/17/2020   Fatty liver 07/17/2020   Morbid obesity ([place]) 07/17/2020   Nausea 16/12/3708   Nonalcoholic steatohepatitis (NASH) 07/17/2020   Personal history of colonic polyps 07/17/2020   Epigastric pain 07/17/2020   Acute medial meniscal tear, right, subsequent encounter 05/01/2020   ETD (Eustachian tube dysfunction), bilateral 12/27/2019   Obesity (BMI 30-39.9) 07/12/2018   Cervical spondylosis with radiculopathy 11/12/2017   Vitamin D deficiency 07/07/2017   Urinary incontinence in female 12/22/2013   Asthma, mild intermittent 03/22/2013   Vaginal itching 06/01/2012  Thyromegaly 03/18/2012   General medical examination 03/14/2011   CHICKENPOX, HX OF 02/15/2010   Hyperlipidemia 02/14/2009   DEPRESSION 02/14/2009   ALLERGIC RHINITIS 02/14/2009   Current Meds  Medication Sig   albuterol (VENTOLIN HFA) 108 (90 Base) MCG/ACT inhaler INHALE 2 PUFFS INTO THE LUNGS EVERY 6 HOURS AS NEEDED FOR WHEEZING.   ALPRAZolam (XANAX) 0.5 MG tablet Take 1 tablet (0.5 mg total) by mouth 3 (three) times daily as needed.   cetirizine  (ZYRTEC) 10 MG tablet TAKE 1 TABLET (10 MG TOTAL) BY MOUTH DAILY.   Cholecalciferol (VITAMIN D) 50 MCG (2000 UT) CAPS Take by mouth daily.   Cholecalciferol (VITAMIN D-3) 125 MCG (5000 UT) TABS Take 5,000 Units by mouth every evening.   estradiol (ESTRACE) 0.1 MG/GM vaginal cream Place vaginally.   fluticasone (FLONASE) 50 MCG/ACT nasal spray PLACE 2 SPRAYS INTO BOTH NOSTRILS DAILY.   montelukast (SINGULAIR) 10 MG tablet TAKE 1 TABLET (10 MG TOTAL) BY MOUTH DAILY.   ondansetron (ZOFRAN) 8 MG tablet Take 1 tablet (8 mg total) by mouth every 8 (eight) hours as needed for nausea or vomiting.   pantoprazole (PROTONIX) 40 MG tablet TAKE 1 TABLET (40 MG TOTAL) BY MOUTH AT BEDTIME.   Semaglutide, 1 MG/DOSE, (OZEMPIC, 1 MG/DOSE,) 4 MG/3ML SOPN Inject 1 mg as directed once a week.   sertraline (ZOLOFT) 100 MG tablet TAKE 1 TABLET (100 MG TOTAL) BY MOUTH DAILY.   VITAMIN D PO Take by mouth.   [DISCONTINUED] cetirizine (ZYRTEC) 10 MG tablet Take 10 mg by mouth daily.    Allergies: Patient is allergic to amoxicillin, erythromycin, hydrocodone-acetaminophen, hydrocodone-acetaminophen, penicillin g, and shellfish allergy. Family History: Patient family history includes Breast cancer in her maternal aunt; Cancer in an other family member; Colon cancer in her paternal aunt and another family member; Diabetes in her father; Fibrocystic breast disease in her mother; Hypertension in her father; Stroke in her maternal grandmother. Social History:  Patient  reports that she has never smoked. She has never used smokeless tobacco. She reports current alcohol use. She reports that she does not use drugs.  Review of Systems: Constitutional: Negative for weight loss  Cardiovascular: negative for chest pain or palpitations Respiratory: negative for SOB or persistent cough Gastrointestinal: no severe pain, + cramping  Objective  Vitals: BP 110/80    Pulse 83    Temp (!) 97 F (36.1 C) (Temporal)    Ht 5\' 2"  (1.575  m)    Wt 177 lb (80.3 kg)    SpO2 94%    BMI 32.37 kg/m  General: no acute distress , A&Ox3 HEENT: PEERL, conjunctiva normal, Oropharynx very dry Cardiovascular:  RRR without murmur or gallop.  Respiratory:  Good breath sounds bilaterally, CTAB with normal respiratory effort Gastrointestinal: soft, increased bowel sounds, minimally tender diffusely but mostly in LUQ w/o masses rebound or guarding.  Skin:  Warm, dry, no rashes Standing in hallway for labs: became very lightheaded and dizzy. Improved with sitting down.   Assessment  1. Diarrhea of presumed infectious origin   2. Generalized abdominal pain   3. Dehydration      Plan  Diarrhe/n/v:  presumed infectious but given persistent sxs, recurrent dehydration and orthostatic sxs, with ongoing pain refer back to ER for IVF, labs including stool studies (unable to obtain here given her symptoms) and abdominal imaging. Also needs labs including lipase given LUQ tenderness. Differntial dx includes bacterial colitis, diverticulitis, viral GE, pancreatitis or other.  Pt was discharged in care of a  friend who will transport her to the hospital. She was stable upon discharge.    I spent a total of 50 minutes for this patient encounter. Time spent included preparation, face-to-face counseling with the patient and coordination of care, review of chart and records, and documentation of the encounter.  This note was prepared with assistance of Systems analyst. Occasional wrong-word or sound-a-like substitutions may have occurred due to the inherent limitations of voice recognition software  No orders of the defined types were placed in this encounter.

## 2021-05-31 NOTE — Patient Instructions (Signed)
Please call Dr. Virgil Benedict office to be seen for follow up early next week.   I will release your lab results to you on your MyChart account with further instructions. Please reply with any questions.

## 2021-06-04 ENCOUNTER — Encounter: Payer: Self-pay | Admitting: *Deleted

## 2021-06-04 ENCOUNTER — Ambulatory Visit: Payer: 59 | Admitting: Family Medicine

## 2021-06-04 ENCOUNTER — Encounter: Payer: Self-pay | Admitting: Family Medicine

## 2021-06-04 ENCOUNTER — Other Ambulatory Visit: Payer: Self-pay

## 2021-06-04 ENCOUNTER — Other Ambulatory Visit (HOSPITAL_COMMUNITY): Payer: Self-pay

## 2021-06-04 VITALS — BP 122/74 | HR 87 | Temp 97.7°F | Resp 16 | Wt 178.4 lb

## 2021-06-04 DIAGNOSIS — K529 Noninfective gastroenteritis and colitis, unspecified: Secondary | ICD-10-CM

## 2021-06-04 DIAGNOSIS — R7989 Other specified abnormal findings of blood chemistry: Secondary | ICD-10-CM

## 2021-06-04 DIAGNOSIS — N179 Acute kidney failure, unspecified: Secondary | ICD-10-CM

## 2021-06-04 LAB — BASIC METABOLIC PANEL
BUN: 8 mg/dL (ref 6–23)
CO2: 30 mEq/L (ref 19–32)
Calcium: 9.1 mg/dL (ref 8.4–10.5)
Chloride: 105 mEq/L (ref 96–112)
Creatinine, Ser: 0.95 mg/dL (ref 0.40–1.20)
GFR: 66.28 mL/min (ref >60.00)
Glucose, Bld: 102 mg/dL — ABNORMAL HIGH (ref 70–99)
Potassium: 3.9 mEq/L (ref 3.5–5.1)
Sodium: 140 mEq/L (ref 135–145)

## 2021-06-04 LAB — HEPATIC FUNCTION PANEL
ALT: 69 U/L — ABNORMAL HIGH (ref 0–35)
AST: 45 U/L — ABNORMAL HIGH (ref 0–37)
Albumin: 3.6 g/dL (ref 3.5–5.2)
Alkaline Phosphatase: 124 U/L — ABNORMAL HIGH (ref 39–117)
Bilirubin, Direct: 0.1 mg/dL (ref 0.0–0.3)
Total Bilirubin: 0.5 mg/dL (ref 0.2–1.2)
Total Protein: 6.3 g/dL (ref 6.0–8.3)

## 2021-06-04 MED ORDER — PANTOPRAZOLE SODIUM 40 MG PO TBEC
40.0000 mg | DELAYED_RELEASE_TABLET | Freq: Two times a day (BID) | ORAL | 1 refills | Status: DC
  Filled 2021-06-04: qty 180, 90d supply, fill #0
  Filled 2022-01-08: qty 180, 90d supply, fill #1

## 2021-06-04 NOTE — Patient Instructions (Addendum)
Follow up as needed or as scheduled We'll notify you of your lab results and make any changes if needed Continue to drink plenty of fluids and advance your diet as you are able Call with any questions or concerns Stay Safe!  Stay Healthy! Happy Holidays!!

## 2021-06-04 NOTE — Progress Notes (Signed)
° °  Subjective:    Patient ID: Katelyn Reyes, female    DOB: [birth year], 60 y.o.   MRN: [id number]  HPI ER f/u- pt presented to ER on 05/31/21 w/ N/V/D x7 days.  CT showed fluid in the colon consistent w/ diarrhea.  She received IVF due to poor PO intake.  She was started on Cipro and Flagyl to cover infectious diarrhea but GI panel was subsequently negative.  Liver enzymes were elevated- AST 99, ALT 172.  Cr 1.3  Pt reports vomiting and diarrhea have improved.  Pt reports she is able to eat- had a [place] sandwich yesterday.  Abd pain has improved.  Has some residual burning but is taking Protonix nightly.     Review of Systems For ROS see HPI   This visit occurred during the SARS-CoV-2 public health emergency.  Safety protocols were in place, including screening questions prior to the visit, additional usage of staff PPE, and extensive cleaning of exam room while observing appropriate contact time as indicated for disinfecting solutions.      Objective:   Physical Exam Constitutional:      General: She is not in acute distress.    Appearance: Normal appearance. She is well-developed. She is not ill-appearing.  HENT:     Head: Normocephalic and atraumatic.  Eyes:     Conjunctiva/sclera: Conjunctivae normal.     Pupils: Pupils are equal, round, and reactive to light.  Neck:     Thyroid: No thyromegaly.  Cardiovascular:     Rate and Rhythm: Normal rate and regular rhythm.     Heart sounds: Normal heart sounds. No murmur heard. Pulmonary:     Effort: Pulmonary effort is normal. No respiratory distress.     Breath sounds: Normal breath sounds.  Abdominal:     General: There is no distension.     Palpations: Abdomen is soft.     Tenderness: There is no abdominal tenderness.  Musculoskeletal:     Cervical back: Normal range of motion and neck supple.  Lymphadenopathy:     Cervical: No cervical adenopathy.  Skin:    General: Skin is warm and dry.  Neurological:     Mental Status:  She is alert and oriented to person, place, and time.  Psychiatric:        Behavior: Behavior normal.          Assessment & Plan:   Gastroenteritis- new.  Pt had 7 days of intense sxs and ended up in the ER multiple times due to pain and dehydration.  Thankfully sxs resolved after taking Cipro and Flagyl and she is not able to drink fluids and tolerate a lite diet.  Some epigastric burning from excess acid- will double the Protonix to twice daily for the next 1-2 weeks.  Pt expressed understanding and is in agreement w/ plan. R  AKI- Cr was up to 1.3  Will repeat today and make sure it is trending appropriately downward.  Elevated LFTs- liver functions were high at the ER.  Will repeat today to ensure they are trending downward.  Pt expressed understanding and is in agreement w/ plan.

## 2021-06-04 NOTE — Progress Notes (Signed)
Received a message from [place] at [employer] notifying us that they are unable to receive a specimen for testing on this patient.   They reached out to [facility] who states they sent out block to outside facility and never received the block back. They also called the outside facility and they state they don't have the tissue block. There are no further specimens available for testing.   Notified Dr Marin Olp.   Oncology Nurse Navigator Documentation  Oncology Nurse Navigator Flowsheets 06/04/2021  Abnormal Finding Date -  Confirmed Diagnosis Date -  Diagnosis Status -  Planned Course of Treatment -  Phase of Treatment -  Expected Surgery Date -  Surgery Actual Start Date: -  Navigator Follow Up Date: 06/28/2021  Navigator Follow Up Reason: Follow-up Appointment  Navigator Location [facility]  Referral Date to RadOnc/MedOnc -  Navigator Encounter Type Pathology Review  Telephone -  Treatment Initiated Date -  Patient Visit Type MedOnc  Treatment Phase Active Tx  Barriers/Navigation Needs Coordination of Care;Education  Education -  Interventions Coordination of Care  Acuity Level 2-Minimal Needs (1-2 Barriers Identified)  Coordination of Care Pathology;Other  Education Method -  Support Groups/Services Friends and Family  Time Spent with Patient 15

## 2021-06-06 ENCOUNTER — Encounter: Payer: Self-pay | Admitting: Physical Therapy

## 2021-06-06 ENCOUNTER — Ambulatory Visit: Payer: 59 | Admitting: Physical Therapy

## 2021-06-06 ENCOUNTER — Other Ambulatory Visit: Payer: Self-pay

## 2021-06-06 DIAGNOSIS — R293 Abnormal posture: Secondary | ICD-10-CM | POA: Diagnosis not present

## 2021-06-06 DIAGNOSIS — Z483 Aftercare following surgery for neoplasm: Secondary | ICD-10-CM | POA: Diagnosis not present

## 2021-06-06 DIAGNOSIS — M25612 Stiffness of left shoulder, not elsewhere classified: Secondary | ICD-10-CM | POA: Diagnosis not present

## 2021-06-06 DIAGNOSIS — M25611 Stiffness of right shoulder, not elsewhere classified: Secondary | ICD-10-CM | POA: Diagnosis not present

## 2021-06-06 DIAGNOSIS — C50411 Malignant neoplasm of upper-outer quadrant of right female breast: Secondary | ICD-10-CM | POA: Diagnosis not present

## 2021-06-06 DIAGNOSIS — Z170 Estrogen receptor positive status [ER+]: Secondary | ICD-10-CM | POA: Diagnosis not present

## 2021-06-06 NOTE — Therapy (Signed)
[place] @ [address] [place] [place], [place], [address] Phone: [phone]   Fax:  [phone]  Physical Therapy Treatment  Patient Details  Name: Katelyn Reyes MRN: [id number] Date of Birth: 07-28-1960 Referring Provider (PT): Dr. Iran Planas   Encounter Date: 06/06/2021   PT End of Session - 06/06/21 0957     Visit Number 2    Number of Visits 9    Date for PT Re-Evaluation 06/26/21    PT Start Time 0907    PT Stop Time 0952    PT Time Calculation (min) 45 min    Activity Tolerance Patient tolerated treatment well    Behavior During Therapy [facility] for tasks assessed/performed             Past Medical History:  Diagnosis Date   Allergic rhinitis    Anxiety    Arthritis    Asthma    related to seasonal allergies   Breast calcification, right 02/02/2013   Surgically excised 02/17/13 > path benign    Cancer ([place])    Depression    Family history of breast cancer 04/12/2021   Family history of ovarian cancer 04/12/2021   GERD (gastroesophageal reflux disease)    Hyperlipidemia    IUD 02/07/2010    Past Surgical History:  Procedure Laterality Date   BREAST BIOPSY Right 02/17/2013   Procedure:  NEEDLE LOCALIZATION REMOVAL RIGHT BREAST CALCIFICATIONS;  Surgeon: Haywood Lasso, MD;  Location: [place];  Service: General;  Laterality: Right;   BREAST EXCISIONAL BIOPSY Right pt unsure   benign   BREAST RECONSTRUCTION WITH PLACEMENT OF TISSUE EXPANDER AND ALLODERM Bilateral 04/26/2021   Procedure: BREAST RECONSTRUCTION WITH PLACEMENT OF TISSUE EXPANDER AND ALLODERM;  Surgeon: Irene Limbo, MD;  Location: [place];  Service: Plastics;  Laterality: Bilateral;   BURCH PROCEDURE N/A 12/22/2013   Procedure: BURCH CYSTO URETHROPEXY, ANTERIOR AND POSTERIOR CULPORRHAPHY;  Surgeon: Ena Dawley, MD;  Location: [facility];  Service: Gynecology;  Laterality: N/A;   CERVICAL DISC ARTHROPLASTY  N/A 11/12/2017   Procedure: ARTIFICIAL DISC REPLACEMENT CERVICAL SIX-SEVEN;  Surgeon: Ashok Pall, MD;  Location: [place];  Service: Neurosurgery;  Laterality: N/A;  ARTIFICIAL DISC REPLACEMENT CERVICAL 6- CERVICAL 7   CHOLECYSTECTOMY N/A 10/09/2017   Procedure: LAPAROSCOPIC CHOLECYSTECTOMY WITH INTRAOPERATIVE CHOLANGIOGRAM;  Surgeon: Judeth Horn, MD;  Location: [place];  Service: General;  Laterality: N/A;   KNEE ARTHROSCOPY WITH MEDIAL MENISECTOMY Right 05/01/2020   Procedure: KNEE ARTHROSCOPY WITH PARTIAL MEDIAL MENISECTOMY,   PATELLA  CHONDROPLASTY;  Surgeon: Paralee Cancel, MD;  Location: [facility];  Service: Orthopedics;  Laterality: Right;   MASTECTOMY W/ SENTINEL NODE BIOPSY Right 04/26/2021   Procedure: RIGHT MASTECTOMY WITH RIGHT SENTINEL LYMPH NODE BIOPSY;  Surgeon: [name], MD;  Location: [place];  Service: General;  Laterality: Right;   MOUTH SURGERY     NASAL SINUS SURGERY     TOTAL MASTECTOMY Left 04/26/2021   Procedure: LEFT TOTAL MASTECTOMY;  Surgeon: [name], MD;  Location: [place];  Service: General;  Laterality: Left;    There were no vitals filed for this visit.   Subjective Assessment - 06/06/21 0910     Subjective I had to go get fluids. I had the stomach bug and it was horrible. I was finally able to eat. I was too sick to exercise.    Pertinent History R breast cancer s/p bilateral mastectomies, IDC ER+, PR+, HER2-, 0/1  SLN, expanders placed    Patient Stated Goals to regain ROM and strength    Currently in Pain? No/denies    Pain Score 0-No pain                               [facility] Adult PT Treatment/Exercise - 06/06/21 0001       Shoulder Exercises: Pulleys   Flexion 2 minutes    ABduction 2 minutes      Shoulder Exercises: Therapy Ball   Flexion 10 reps;Both   pt fatigued after this exercise     Manual Therapy   Manual Therapy Passive ROM;Scapular mobilization;Soft  tissue mobilization    Soft tissue mobilization to R scapular musculature incluidng rhomboids, lats, upper traps, teres    Scapular Mobilization in L s/l to R scapula in to protraction/retraction and depression    Passive ROM to bilateral shoulders in direction of flexion, abduction and ER                          PT Long Term Goals - 05/29/21 1200       PT LONG TERM GOAL #1   Title Pt will not demonstrate any cording in RUE to allow more comfort with ROM    Time 4    Period Weeks    Status New    Target Date 06/26/21      PT LONG TERM GOAL #2   Title Pt will demonstrate 165 degrees of left shoulder flexion to allow her to reach overhead.    Baseline 56    Time 4    Period Weeks    Status New    Target Date 06/26/21      PT LONG TERM GOAL #3   Title Pt will demonstrate 165 degrees of left shoulder abduction to allow her to reach out to the side    Baseline 67    Time 4    Period Weeks    Status New    Target Date 06/26/21      PT LONG TERM GOAL #4   Title Pt will be independent in a home exercise program for continued stretching and strengthening    Time 4    Period Weeks    Status New    Target Date 06/26/21                   Plan - 06/06/21 0958     Clinical Impression Statement Began AAROM exercises today with pt including pulleys and ball up wall. Pt did well with these and demonstrate great ROM. She reports she was unable to exercise since her last visit because she was very sick with a stomach bug. She continues to have increased tightness in her R shoulder. Focused on manual therapy today including scapular mobilizations to improve ROM and soft tissue mobilization to R scapular musculature. Several areas of tightness were noted. Pt has pain with overhead movement of R UE most likely due to rotator cuff tendonitis due to forward positioning of shoulder. Educated pt how important it is to keep shoulders retracted with overhead shoulder  movement. Attempted supine dowel exercises but stopped due to pain in R shoulder. Pt felt much better by end of session and had improved AROM of bilateral shoulders.    PT Frequency 2x / week    PT Duration 4 weeks    PT Treatment/Interventions ADLs/Self Care  Home Management;Therapeutic exercise;Patient/family education;Manual techniques;Manual lymph drainage;Scar mobilization;Passive range of motion;Taping    PT Next Visit Plan instruct in supine scap exercises, PROM bilateral shoulders, pulleys, ball, supine dowel if she does not have shoulder pain with it, re measure ROM, measure pt for prophylactic sleeve    PT Home Exercise Plan post op breast exercises    Consulted and Agree with Plan of Care Patient             Patient will benefit from skilled therapeutic intervention in order to improve the following deficits and impairments:  Pain, Postural dysfunction, Impaired UE functional use, Impaired flexibility, Increased fascial restricitons, Decreased strength, Decreased range of motion, Decreased scar mobility  Visit Diagnosis: Stiffness of right shoulder, not elsewhere classified  Aftercare following surgery for neoplasm  Stiffness of left shoulder, not elsewhere classified  Abnormal posture  Malignant neoplasm of upper-outer quadrant of right breast in female, estrogen receptor positive ([place])     Problem List Patient Active Problem List   Diagnosis Date Noted   Breast cancer, right ([place]) 04/26/2021   Genetic testing 04/19/2021   Family history of breast cancer 04/12/2021   Malignant neoplasm of upper outer quadrant of female breast ([place]) 04/12/2021   Family history of colon cancer 04/12/2021   Family history of ovarian cancer 64/15/8309   Metabolic syndrome 40/76/8088   Change in bowel habit 07/17/2020   Diverticular disease of colon 07/17/2020   Family history of malignant neoplasm of gastrointestinal tract 07/17/2020   Fatty liver 07/17/2020   Morbid obesity ([place])  07/17/2020   Nausea 04/18/1593   Nonalcoholic steatohepatitis ([facility]) 07/17/2020   Personal history of colonic polyps 07/17/2020   Epigastric pain 07/17/2020   Acute medial meniscal tear, right, subsequent encounter 05/01/2020   ETD (Eustachian tube dysfunction), bilateral 12/27/2019   Obesity (BMI 30-39.9) 07/12/2018   Cervical spondylosis with radiculopathy 11/12/2017   Vitamin D deficiency 07/07/2017   Urinary incontinence in female 12/22/2013   Asthma, mild intermittent 03/22/2013   Vaginal itching 06/01/2012   Thyromegaly 03/18/2012   General medical examination 03/14/2011   CHICKENPOX, HX OF 02/15/2010   Hyperlipidemia 02/14/2009   DEPRESSION 02/14/2009   ALLERGIC RHINITIS 02/14/2009    Manus Gunning, PT 06/06/2021, 11:03 AM  [facility] @ [address] [place], [place], [address] Phone: [phone]   Fax:  [phone]  Name: Katelyn Reyes MRN: [id number] Date of Birth: 04-May-1961

## 2021-06-07 ENCOUNTER — Other Ambulatory Visit (HOSPITAL_COMMUNITY): Payer: Self-pay

## 2021-06-18 ENCOUNTER — Encounter: Payer: 59 | Admitting: Rehabilitation

## 2021-06-19 ENCOUNTER — Encounter: Payer: Self-pay | Admitting: Physical Therapy

## 2021-06-19 ENCOUNTER — Other Ambulatory Visit: Payer: Self-pay

## 2021-06-19 ENCOUNTER — Ambulatory Visit: Payer: 59 | Attending: Plastic Surgery | Admitting: Physical Therapy

## 2021-06-19 DIAGNOSIS — C50411 Malignant neoplasm of upper-outer quadrant of right female breast: Secondary | ICD-10-CM | POA: Insufficient documentation

## 2021-06-19 DIAGNOSIS — M25612 Stiffness of left shoulder, not elsewhere classified: Secondary | ICD-10-CM

## 2021-06-19 DIAGNOSIS — R293 Abnormal posture: Secondary | ICD-10-CM | POA: Diagnosis not present

## 2021-06-19 DIAGNOSIS — M25611 Stiffness of right shoulder, not elsewhere classified: Secondary | ICD-10-CM

## 2021-06-19 DIAGNOSIS — Z170 Estrogen receptor positive status [ER+]: Secondary | ICD-10-CM | POA: Diagnosis not present

## 2021-06-19 DIAGNOSIS — Z483 Aftercare following surgery for neoplasm: Secondary | ICD-10-CM | POA: Diagnosis not present

## 2021-06-19 NOTE — Patient Instructions (Signed)
Access Code: [email or web address] URL: [email or web address] Date: 06/19/2021 Prepared by: Manus Gunning  Exercises Standing Isometric Shoulder Internal Rotation at Doorway - 2 x daily - 7 x weekly - 1 sets - 10 reps - 5 sec hold Standing Isometric Shoulder External Rotation with Doorway (Mirrored) - 2 x daily - 7 x weekly - 3 sets - 10 reps - 5 sec hold Standing Isometric Shoulder Extension with Doorway - Arm Bent - 2 x daily - 7 x weekly - 3 sets - 10 reps - 5 sec hold Standing Isometric Shoulder Flexion with Doorway - Arm Bent (Mirrored) - 2 x daily - 7 x weekly - 3 sets - 10 reps - 5 sec hold Scapular Retraction with Resistance - 2 x daily - 7 x weekly - 3 sets - 10 reps

## 2021-06-19 NOTE — Therapy (Signed)
[place] @ [address] [place], [place], [address] Phone: [phone]   Fax:  [phone]  Physical Therapy Treatment  Patient Details  Name: Katelyn Reyes MRN: [id number] Date of Birth: Nov 02, 1960 Referring Provider (PT): Dr. Iran Planas   Encounter Date: 06/19/2021   PT End of Session - 06/19/21 0858     Visit Number 3    Number of Visits 9    Date for PT Re-Evaluation 06/26/21    PT Start Time 0803    PT Stop Time 9485    PT Time Calculation (min) 53 min    Activity Tolerance Patient tolerated treatment well    Behavior During Therapy [facility] for tasks assessed/performed             Past Medical History:  Diagnosis Date   Allergic rhinitis    Anxiety    Arthritis    Asthma    related to seasonal allergies   Breast calcification, right 02/02/2013   Surgically excised 02/17/13 > path benign    Cancer ([place])    Depression    Family history of breast cancer 04/12/2021   Family history of ovarian cancer 04/12/2021   GERD (gastroesophageal reflux disease)    Hyperlipidemia    IUD 02/07/2010    Past Surgical History:  Procedure Laterality Date   BREAST BIOPSY Right 02/17/2013   Procedure:  NEEDLE LOCALIZATION REMOVAL RIGHT BREAST CALCIFICATIONS;  Surgeon: Haywood Lasso, MD;  Location: [place];  Service: General;  Laterality: Right;   BREAST EXCISIONAL BIOPSY Right pt unsure   benign   BREAST RECONSTRUCTION WITH PLACEMENT OF TISSUE EXPANDER AND ALLODERM Bilateral 04/26/2021   Procedure: BREAST RECONSTRUCTION WITH PLACEMENT OF TISSUE EXPANDER AND ALLODERM;  Surgeon: Irene Limbo, MD;  Location: [place];  Service: Plastics;  Laterality: Bilateral;   BURCH PROCEDURE N/A 12/22/2013   Procedure: BURCH CYSTO URETHROPEXY, ANTERIOR AND POSTERIOR CULPORRHAPHY;  Surgeon: Ena Dawley, MD;  Location: [facility];  Service: Gynecology;  Laterality: N/A;   CERVICAL DISC ARTHROPLASTY  N/A 11/12/2017   Procedure: ARTIFICIAL DISC REPLACEMENT CERVICAL SIX-SEVEN;  Surgeon: Ashok Pall, MD;  Location: [place];  Service: Neurosurgery;  Laterality: N/A;  ARTIFICIAL DISC REPLACEMENT CERVICAL 6- CERVICAL 7   CHOLECYSTECTOMY N/A 10/09/2017   Procedure: LAPAROSCOPIC CHOLECYSTECTOMY WITH INTRAOPERATIVE CHOLANGIOGRAM;  Surgeon: Judeth Horn, MD;  Location: [place];  Service: General;  Laterality: N/A;   KNEE ARTHROSCOPY WITH MEDIAL MENISECTOMY Right 05/01/2020   Procedure: KNEE ARTHROSCOPY WITH PARTIAL MEDIAL MENISECTOMY,   PATELLA  CHONDROPLASTY;  Surgeon: Paralee Cancel, MD;  Location[facility];  Service: Orthopedics;  Laterality: Right;   MASTECTOMY W/ SENTINEL NODE BIOPSY Right 04/26/2021   Procedure: RIGHT MASTECTOMY WITH RIGHT SENTINEL LYMPH NODE BIOPSY;  Surgeon: [name], MD;  Location: [place];  Service: General;  Laterality: Right;   MOUTH SURGERY     NASAL SINUS SURGERY     TOTAL MASTECTOMY Left 04/26/2021   Procedure: LEFT TOTAL MASTECTOMY;  Surgeon: [name], MD;  Location: [place];  Service: General;  Laterality: Left;    There were no vitals filed for this visit.   Subjective Assessment - 06/19/21 0805     Subjective I feel pretty good. This shoulder is bothering me today. I have been trying to pull it back. I had a spasm go across it last night.    Pertinent History R breast cancer s/p bilateral mastectomies, IDC ER+, PR+, HER2-, 0/1  SLN, expanders placed    Patient Stated Goals to regain ROM and strength    Currently in Pain? Yes    Pain Score 4     Pain Location Shoulder    Pain Orientation Right    Pain Descriptors / Indicators Throbbing    Pain Type Acute pain    Pain Onset Yesterday    Pain Frequency Constant    Aggravating Factors  nothing    Pain Relieving Factors nothing    Effect of Pain on Daily Activities too soon to tell                [facility] PT Assessment - 06/19/21 0001        AROM   Right Shoulder Flexion 116 Degrees    Right Shoulder ABduction 120 Degrees    Left Shoulder Flexion 166 Degrees    Left Shoulder ABduction 170 Degrees      Special Tests    Special Tests Rotator Cuff Impingement      Empty Can test   Findings Positive    Side Right    Comment painful      Drop Arm test   Findings Positive    Side Right    Comment with pain but able to control                           [facility] Adult PT Treatment/Exercise - 06/19/21 0001       Shoulder Exercises: Supine   Other Supine Exercises Attempted to instruct in supine scapular strengthening exercises but pt unable to initiate flexion on R side in supine position. She requires full assist until her shoulder is around 90 degrees and then she is able to activate      Shoulder Exercises: Seated   Retraction Strengthening;Both;10 reps;Theraband    Theraband Level (Shoulder Retraction) Level 1 (Yellow)      Shoulder Exercises: Pulleys   Flexion 2 minutes    ABduction 2 minutes      Shoulder Exercises: Therapy Ball   Flexion 10 reps;Both   pt fatigued after this exercise   ABduction Right;5 reps   stopped secondary to pain     Shoulder Exercises: Isometric Strengthening   Flexion 5X5"    Extension 5X5"    External Rotation 5X5"    Internal Rotation 5X5"      Manual Therapy   Passive ROM to R shoulder in to flexion and abduction while keeping scapula depressed, pt had discomfort in R shoulder                     PT Education - 06/19/21 0906     Education Details need for ortho eval to assess for rotator cuff tear secondary to pain and inability to lift arm into flexion in supine position    Person(s) Educated Patient    Methods Explanation    Comprehension Verbalized understanding                 PT Long Term Goals - 05/29/21 1200       PT LONG TERM GOAL #1   Title Pt will not demonstrate any cording in RUE to allow more comfort with ROM    Time 4     Period Weeks    Status New    Target Date 06/26/21      PT LONG TERM GOAL #2   Title Pt will demonstrate 165 degrees of left shoulder  flexion to allow her to reach overhead.    Baseline 56    Time 4    Period Weeks    Status New    Target Date 06/26/21      PT LONG TERM GOAL #3   Title Pt will demonstrate 165 degrees of left shoulder abduction to allow her to reach out to the side    Baseline 67    Time 4    Period Weeks    Status New    Target Date 06/26/21      PT LONG TERM GOAL #4   Title Pt will be independent in a home exercise program for continued stretching and strengthening    Time 4    Period Weeks    Status New    Target Date 06/26/21                   Plan - 06/19/21 0906     Clinical Impression Statement Pt continuing to have pain in her R shoulder. Remeasured shoulder ROM today and her L has returned to [facility] but her R while it has improved since 12/14 it is still painful. She has difficulty dressing and throbbing pain in the shoulder. Attempted to instruct pt in supine scapular strengthening exercises today but pt was unable to initiate flexion on R side in supine position. She required full assist until around 90 degrees. She had a positive empty can and drop arm test (pain with both). Instructed pt in isometric exercises and scapular retraction so she can work on strengthening without pain. Encouraged pt to follow up with an ortho dr to have her R shoulder checked.    PT Frequency 2x / week    PT Duration 4 weeks    PT Treatment/Interventions ADLs/Self Care Home Management;Therapeutic exercise;Patient/family education;Manual techniques;Manual lymph drainage;Scar mobilization;Passive range of motion;Taping    PT Next Visit Plan how are isometrics?, did she get appt with ortho?, PROM bilateral shoulders, pulleys, ball, supine dowel if she does not have shoulder pain with it, re measure ROM, measure pt for prophylactic sleeve    PT Home Exercise Plan post  op breast exercises, isometrics, scap retraction    Consulted and Agree with Plan of Care Patient             Patient will benefit from skilled therapeutic intervention in order to improve the following deficits and impairments:  Pain, Postural dysfunction, Impaired UE functional use, Impaired flexibility, Increased fascial restricitons, Decreased strength, Decreased range of motion, Decreased scar mobility  Visit Diagnosis: Stiffness of right shoulder, not elsewhere classified  Aftercare following surgery for neoplasm  Stiffness of left shoulder, not elsewhere classified  Abnormal posture  Malignant neoplasm of upper-outer quadrant of right breast in female, estrogen receptor positive ([place])     Problem List Patient Active Problem List   Diagnosis Date Noted   Breast cancer, right ([place]) 04/26/2021   Genetic testing 04/19/2021   Family history of breast cancer 04/12/2021   Malignant neoplasm of upper outer quadrant of female breast ([place]) 04/12/2021   Family history of colon cancer 04/12/2021   Family history of ovarian cancer 26/94/8546   Metabolic syndrome 27/08/5007   Change in bowel habit 07/17/2020   Diverticular disease of colon 07/17/2020   Family history of malignant neoplasm of gastrointestinal tract 07/17/2020   Fatty liver 07/17/2020   Morbid obesity ([place]) 07/17/2020   Nausea 38/18/2993   Nonalcoholic steatohepatitis ([facility]) 07/17/2020   Personal history of colonic polyps 07/17/2020  Epigastric pain 07/17/2020   Acute medial meniscal tear, right, subsequent encounter 05/01/2020   ETD (Eustachian tube dysfunction), bilateral 12/27/2019   Obesity (BMI 30-39.9) 07/12/2018   Cervical spondylosis with radiculopathy 11/12/2017   Vitamin D deficiency 07/07/2017   Urinary incontinence in female 12/22/2013   Asthma, mild intermittent 03/22/2013   Vaginal itching 06/01/2012   Thyromegaly 03/18/2012   General medical examination 03/14/2011   CHICKENPOX, HX OF  02/15/2010   Hyperlipidemia 02/14/2009   DEPRESSION 02/14/2009   ALLERGIC RHINITIS 02/14/2009    Manus Gunning, PT 06/19/2021, 9:10 AM  [place] @ [address] [place], [place], [address] Phone: [phone]   Fax:  [phone]  Name: Katelyn Reyes MRN: [id number] Date of Birth: [birth year]

## 2021-06-20 ENCOUNTER — Encounter: Payer: 59 | Admitting: Rehabilitation

## 2021-06-20 ENCOUNTER — Other Ambulatory Visit (HOSPITAL_COMMUNITY): Payer: Self-pay

## 2021-06-20 MED ORDER — SULFAMETHOXAZOLE-TRIMETHOPRIM 800-160 MG PO TABS
1.0000 | ORAL_TABLET | Freq: Two times a day (BID) | ORAL | 0 refills | Status: DC
  Filled 2021-06-20: qty 14, 7d supply, fill #0

## 2021-06-24 ENCOUNTER — Other Ambulatory Visit: Payer: Self-pay | Admitting: Family Medicine

## 2021-06-24 ENCOUNTER — Encounter: Payer: 59 | Admitting: Rehabilitation

## 2021-06-24 ENCOUNTER — Other Ambulatory Visit (HOSPITAL_COMMUNITY): Payer: Self-pay

## 2021-06-24 ENCOUNTER — Encounter: Payer: 59 | Admitting: Physical Therapy

## 2021-06-24 MED ORDER — ZOLPIDEM TARTRATE 10 MG PO TABS
10.0000 mg | ORAL_TABLET | Freq: Every evening | ORAL | 3 refills | Status: DC | PRN
  Filled 2021-06-24: qty 30, 30d supply, fill #0
  Filled 2021-09-09: qty 30, 30d supply, fill #1
  Filled 2021-10-14 (×2): qty 30, 30d supply, fill #2
  Filled 2021-11-18: qty 30, 30d supply, fill #3

## 2021-06-24 MED ORDER — SERTRALINE HCL 100 MG PO TABS
100.0000 mg | ORAL_TABLET | Freq: Every day | ORAL | 1 refills | Status: DC
  Filled 2021-06-24: qty 90, 90d supply, fill #0
  Filled 2021-10-14 (×2): qty 90, 90d supply, fill #1

## 2021-06-24 MED FILL — Montelukast Sodium Tab 10 MG (Base Equiv): ORAL | 90 days supply | Qty: 90 | Fill #0 | Status: AC

## 2021-06-26 ENCOUNTER — Other Ambulatory Visit: Payer: Self-pay

## 2021-06-26 ENCOUNTER — Ambulatory Visit (INDEPENDENT_AMBULATORY_CARE_PROVIDER_SITE_OTHER): Payer: 59

## 2021-06-26 ENCOUNTER — Ambulatory Visit: Payer: 59 | Admitting: Physical Therapy

## 2021-06-26 ENCOUNTER — Ambulatory Visit: Payer: Self-pay

## 2021-06-26 ENCOUNTER — Ambulatory Visit: Payer: 59 | Admitting: Family Medicine

## 2021-06-26 ENCOUNTER — Encounter: Payer: 59 | Admitting: Rehabilitation

## 2021-06-26 VITALS — BP 116/72 | HR 85 | Ht 62.0 in | Wt 180.6 lb

## 2021-06-26 DIAGNOSIS — M25511 Pain in right shoulder: Secondary | ICD-10-CM

## 2021-06-26 DIAGNOSIS — C50911 Malignant neoplasm of unspecified site of right female breast: Secondary | ICD-10-CM

## 2021-06-26 DIAGNOSIS — G8929 Other chronic pain: Secondary | ICD-10-CM | POA: Diagnosis not present

## 2021-06-26 NOTE — Progress Notes (Signed)
I, Peterson Lombard, LAT, ATC acting as a scribe for Lynne Leader, MD.  Subjective:    CC: R shoulder pain  HPI: Pt is a 61 y/o female c/o R shoulder pain ongoing since Dec. Pt noticed the pain and weakness when doing strengthening at her lymphedema treatments. Pt is currently in PT for her R shoulder and has completed 3 visits. Pt locates pain to deep within the R [place] joint.  Neck pain: no Radiates: sometimes into upper arm and scapular region Mechanical symptoms: yes UE Numbness/tingling: no UE Weakness: yes- sometimes Aggravates: overhead motions, ER Treatments tried: PT, IBU,   Pertinent review of Systems: No fevers or chills  Relevant historical information: Breast cancer   Objective:    Vitals:   06/26/21 1357  BP: 116/72  Pulse: 85  SpO2: 97%   General: Well Developed, well nourished, and in no acute distress.   MSK: Right shoulder Normal. Nontender. Range of motion abduction: 120 degrees.  External rotation full internal rotation lumbar spine. Strength abduction 4/5 external rotation 4/5 internal rotation 4/5. Positive Hawkins and Neer's test.  Positive empty can test. Negative Yergason's and speeds test.    Lab and Radiology Results  Diagnostic Limited MSK Ultrasound of: Right shoulder Biceps tendon intact normal-appearing Subscapularis tendon is intact.  Slight supraspinatus tendon is irregular appearing with areas of hypoechoic change distally concerning for partial thickness tear without significant retraction. Moderate subacromial bursitis is present. Infraspinatus tendon is intact. AC joint mild effusion Impression: Subacromial bursitis and concern for partial-thickness supraspinatus tear.  X-ray images right shoulder obtained today personally and independently interpreted Osteophyte distal acromion present.  Narrowed space between the acromion and humeral head. Sclerosis present humeral head. No acute fractures. No significant DJD glenohumeral  joint.  AC joint mild DJD. Await formal radiology review  Impression and Recommendations:    Assessment and Plan: 61 y.o. female with patient has right shoulder pain ongoing for approximately 2 months.  This has been worsening recently and failing to improve despite adequate physical therapy trial.  Ultrasound today is concerning for rotator cuff tear of the supraspinatus tendon.  Based on her failure to improve with PT and concern for rotator cuff tear as well as her recent breast cancer history we will proceed to MRI of the shoulder to further evaluate source of pain and to determine future treatment plans and options.  Recheck after MRI.  X-ray obtained today.Marland Kitchen  PDMP not reviewed this encounter. Orders Placed This Encounter  Procedures   DG Shoulder Right    Standing Status:   Future    Number of Occurrences:   1    Standing Expiration Date:   06/26/2022    Order Specific Question:   Reason for Exam (SYMPTOM  OR DIAGNOSIS REQUIRED)    Answer:   right shoulder pain    Order Specific Question:   Preferred imaging location?    Answer:   [facility]    Order Specific Question:   Is patient pregnant?    Answer:   No   [place] LIMITED JOINT SPACE STRUCTURES UP RIGHT(NO LINKED CHARGES)    Standing Status:   Future    Number of Occurrences:   1    Standing Expiration Date:   12/24/2021    Order Specific Question:   Reason for Exam (SYMPTOM  OR DIAGNOSIS REQUIRED)    Answer:   right shoulder pain    Order Specific Question:   Preferred imaging location?    Answer:  [place]   MR SHOULDER RIGHT WO CONTRAST    To evaluate for possible R RC tear    Standing Status:   Future    Standing Expiration Date:   06/26/2022    Order Specific Question:   What is the patient's sedation requirement?    Answer:   No Sedation    Order Specific Question:   Does the patient have a pacemaker or implanted devices?    Answer:   No    Order Specific Question:   Preferred imaging  location?    Answer:   [facility] (table limit-350lbs)   No orders of the defined types were placed in this encounter.   Discussed warning signs or symptoms. Please see discharge instructions. Patient expresses understanding.   The above documentation has been reviewed and is accurate and complete Lynne Leader, M.D.

## 2021-06-26 NOTE — Patient Instructions (Addendum)
Thank you for coming in today.  ° °Please get an Xray today before you leave  ° °You should hear from MRI scheduling within 1 week. If you do not hear please let me know.   ° °Recheck back after MRI °

## 2021-06-27 NOTE — Progress Notes (Signed)
Right shoulder x-ray shows some bone spurs.

## 2021-06-28 ENCOUNTER — Inpatient Hospital Stay: Payer: 59 | Admitting: Hematology & Oncology

## 2021-06-28 ENCOUNTER — Inpatient Hospital Stay: Payer: 59 | Attending: Internal Medicine

## 2021-06-28 ENCOUNTER — Encounter: Payer: Self-pay | Admitting: Hematology & Oncology

## 2021-06-28 ENCOUNTER — Other Ambulatory Visit: Payer: Self-pay

## 2021-06-28 ENCOUNTER — Other Ambulatory Visit (HOSPITAL_COMMUNITY): Payer: Self-pay

## 2021-06-28 ENCOUNTER — Encounter: Payer: Self-pay | Admitting: *Deleted

## 2021-06-28 VITALS — BP 103/89 | HR 81 | Temp 98.2°F | Resp 18 | Ht 62.0 in | Wt 179.8 lb

## 2021-06-28 DIAGNOSIS — Z170 Estrogen receptor positive status [ER+]: Secondary | ICD-10-CM | POA: Diagnosis not present

## 2021-06-28 DIAGNOSIS — Z79811 Long term (current) use of aromatase inhibitors: Secondary | ICD-10-CM | POA: Insufficient documentation

## 2021-06-28 DIAGNOSIS — C50411 Malignant neoplasm of upper-outer quadrant of right female breast: Secondary | ICD-10-CM

## 2021-06-28 DIAGNOSIS — C50911 Malignant neoplasm of unspecified site of right female breast: Secondary | ICD-10-CM | POA: Insufficient documentation

## 2021-06-28 DIAGNOSIS — Z79899 Other long term (current) drug therapy: Secondary | ICD-10-CM | POA: Insufficient documentation

## 2021-06-28 DIAGNOSIS — R112 Nausea with vomiting, unspecified: Secondary | ICD-10-CM | POA: Diagnosis present

## 2021-06-28 DIAGNOSIS — R197 Diarrhea, unspecified: Secondary | ICD-10-CM | POA: Diagnosis present

## 2021-06-28 LAB — CBC WITH DIFFERENTIAL (CANCER CENTER ONLY)
Abs Immature Granulocytes: 0.02 10*3/uL (ref 0.00–0.07)
Basophils Absolute: 0 10*3/uL (ref 0.0–0.1)
Basophils Relative: 0 %
Eosinophils Absolute: 0.6 10*3/uL — ABNORMAL HIGH (ref 0.0–0.5)
Eosinophils Relative: 9 %
HCT: 41 % (ref 36.0–46.0)
Hemoglobin: 13.1 g/dL (ref 12.0–15.0)
Immature Granulocytes: 0 %
Lymphocytes Relative: 57 %
Lymphs Abs: 4.2 10*3/uL — ABNORMAL HIGH (ref 0.7–4.0)
MCH: 31.4 pg (ref 26.0–34.0)
MCHC: 32 g/dL (ref 30.0–36.0)
MCV: 98.3 fL (ref 80.0–100.0)
Monocytes Absolute: 0.6 10*3/uL (ref 0.1–1.0)
Monocytes Relative: 8 %
Neutro Abs: 1.9 10*3/uL (ref 1.7–7.7)
Neutrophils Relative %: 26 %
Platelet Count: 293 10*3/uL (ref 150–400)
RBC: 4.17 MIL/uL (ref 3.87–5.11)
RDW: 12.1 % (ref 11.5–15.5)
WBC Count: 7.4 10*3/uL (ref 4.0–10.5)
nRBC: 0 % (ref 0.0–0.2)

## 2021-06-28 LAB — CMP (CANCER CENTER ONLY)
ALT: 24 U/L (ref 0–44)
AST: 23 U/L (ref 15–41)
Albumin: 4.3 g/dL (ref 3.5–5.0)
Alkaline Phosphatase: 75 U/L (ref 38–126)
Anion gap: 9 (ref 5–15)
BUN: 16 mg/dL (ref 6–20)
CO2: 32 mmol/L (ref 22–32)
Calcium: 10.3 mg/dL (ref 8.9–10.3)
Chloride: 104 mmol/L (ref 98–111)
Creatinine: 0.97 mg/dL (ref 0.44–1.00)
GFR, Estimated: >60 mL/min (ref >60)
Glucose, Bld: 97 mg/dL (ref 70–99)
Potassium: 4 mmol/L (ref 3.5–5.1)
Sodium: 145 mmol/L (ref 135–145)
Total Bilirubin: 0.5 mg/dL (ref 0.3–1.2)
Total Protein: 7 g/dL (ref 6.5–8.1)

## 2021-06-28 LAB — LACTATE DEHYDROGENASE: LDH: 193 U/L — ABNORMAL HIGH (ref 98–192)

## 2021-06-28 MED ORDER — ANASTROZOLE 1 MG PO TABS
1.0000 mg | ORAL_TABLET | Freq: Every day | ORAL | 12 refills | Status: DC
  Filled 2021-06-28: qty 30, 30d supply, fill #0
  Filled 2021-07-29: qty 30, 30d supply, fill #1
  Filled 2021-08-30: qty 30, 30d supply, fill #0
  Filled 2021-09-27: qty 30, 30d supply, fill #1

## 2021-06-28 NOTE — Progress Notes (Signed)
Hematology and Oncology Follow Up Visit  Katelyn Reyes [id number] [birth year] 61 y.o. 06/28/2021   Principle Diagnosis:  Stage IA (T1aN0M0) infiltrating ductal carcinoma of the right breast- ER+/PR+/HER2-  Current Therapy:   Status post bilateral mastectomies --04/26/2021 Arimidex 1 mg p.o. daily --start on 06/29/2021     Interim History:  Katelyn Reyes is back for follow-up.  She is feeling much better.  The last time that we saw her, she had this viral gastroenteritis.  We had to give her some IV fluids and some antiemetics.  She felt a whole lot better afterwards.  She goes for her reconstructive surgery on January 27.  We still do not have back the Oncotype score.  Again, I had believe this is going to be quite low.  I will go ahead and put her on Arimidex.  I think this would be reasonable.   We will go ahead and get a bone density test on her.  I think this will be helpful.  She has had no problems with bowels or bladder.  She has had no cough or shortness of breath.  She has had no rashes.  There is been no leg swelling.  She just got a new puppy.  This is a Hospital doctor.  I saw a picture of her.  She is very cute.  Overall, I would say performance status is ECOG 0.    Medications:  Current Outpatient Medications:    albuterol (VENTOLIN HFA) 108 (90 Base) MCG/ACT inhaler, INHALE 2 PUFFS INTO THE LUNGS EVERY 6 HOURS AS NEEDED FOR WHEEZING., Disp: 18 g, Rfl: 6   ALPRAZolam (XANAX) 0.5 MG tablet, Take 1 tablet (0.5 mg total) by mouth 3 (three) times daily as needed., Disp: 60 tablet, Rfl: 3   cetirizine (ZYRTEC) 10 MG tablet, TAKE 1 TABLET (10 MG TOTAL) BY MOUTH DAILY., Disp: 90 tablet, Rfl: 1   Cholecalciferol (VITAMIN D-3) 125 MCG (5000 UT) TABS, Take 5,000 Units by mouth every evening., Disp: , Rfl:    fluticasone (FLONASE) 50 MCG/ACT nasal spray, PLACE 2 SPRAYS INTO BOTH NOSTRILS DAILY., Disp: 16 g, Rfl: 6   montelukast (SINGULAIR) 10 MG tablet, TAKE 1 TABLET (10 MG  TOTAL) BY MOUTH DAILY., Disp: 90 tablet, Rfl: 1   pantoprazole (PROTONIX) 40 MG tablet, Take 1 tablet (40 mg total) by mouth 2 (two) times daily., Disp: 180 tablet, Rfl: 1   Semaglutide, 1 MG/DOSE, (OZEMPIC, 1 MG/DOSE,) 4 MG/3ML SOPN, Inject 1 mg as directed once a week., Disp: 3 mL, Rfl: 2   sertraline (ZOLOFT) 100 MG tablet, Take 1 tablet (100 mg total) by mouth daily., Disp: 90 tablet, Rfl: 1   zolpidem (AMBIEN) 10 MG tablet, Take 1 tablet (10 mg total) by mouth at bedtime as needed for sleep., Disp: 30 tablet, Rfl: 3   ondansetron (ZOFRAN) 8 MG tablet, Take 1 tablet (8 mg total) by mouth every 8 (eight) hours as needed for nausea or vomiting. (Patient not taking: Reported on 06/26/2021), Disp: 20 tablet, Rfl: 0   sulfamethoxazole-trimethoprim (BACTRIM DS) 800-160 MG tablet, Take 1 tablet by mouth 2 (two) times daily. For use following surgery (Patient not taking: Reported on 06/28/2021), Disp: 14 tablet, Rfl: 0  Allergies:  Allergies  Allergen Reactions   Amoxicillin Rash and Other (See Comments)    Has patient had a PCN reaction causing immediate rash, facial/tongue/throat swelling, SOB or lightheadedness with hypotension: No Has patient had a PCN reaction causing severe rash involving mucus membranes or skin necrosis:  No Has patient had a PCN reaction that required treatment: #  #  #  YES  #  #  # - MD office Has patient had a PCN reaction occurring within the last 10 years: Yes If all of the above answers are "NO", then may proceed with Cephalosporin use.  Has patient had a PCN reaction causing immediate rash, facial/tongue/throat swelling, SOB or lightheadedness with hypotension: No Has patient had a PCN reaction causing severe rash involving mucus membranes or skin necrosis: No Has patient had a PCN reaction that required treatment: #  #  #  YES  #  #  # - MD office Has patient had a PCN reaction occurring within the last 10 years: Yes If all of the above answers are "NO", then may  proceed with Cephalosporin use. Has patient had a PCN reaction causing immediate rash, facial/tongue/throat swelling, SOB or lightheadedness with hypotension: No Has patient had a PCN reaction causing severe rash involving mucus membranes or skin necrosis: No Has patient had a PCN reaction that required treatment: #  #  #  YES  #  #  # - MD office Has patient had a PCN reaction occurring within the last 10 years: Yes If all of the above answers are "NO", then may proceed with Cephalosporin use.   Erythromycin Nausea Only   Hydrocodone-Acetaminophen Other (See Comments)    Hallucinations   Hydrocodone-Acetaminophen Nausea Only and Other (See Comments)    Hallucinations    Penicillin G Rash   Shellfish Allergy Itching and Other (See Comments)    REACTS TO SCALLOPS     Past Medical History, Surgical history, Social history, and Family History were reviewed and updated.  Review of Systems: Review of Systems  Constitutional:  Positive for appetite change.  HENT:  Negative.    Eyes: Negative.   Respiratory: Negative.    Cardiovascular: Negative.   Gastrointestinal:  Positive for diarrhea and vomiting.  Endocrine: Negative.   Genitourinary: Negative.    Musculoskeletal: Negative.   Skin: Negative.   Neurological: Negative.   Hematological: Negative.   Psychiatric/Behavioral: Negative.     Physical Exam:  height is 5' 2" (1.575 m) and weight is 179 lb 12.8 oz (81.6 kg). Her oral temperature is 98.2 F (36.8 C). Her blood pressure is 103/89 and her pulse is 81. Her respiration is 18 and oxygen saturation is 98%.   Wt Readings from Last 3 Encounters:  06/28/21 179 lb 12.8 oz (81.6 kg)  06/26/21 180 lb 9.6 oz (81.9 kg)  06/04/21 178 lb 6.4 oz (80.9 kg)    Physical Exam Vitals reviewed.  Constitutional:      Comments: Breast exam shows status post mastectomies.  The sites are healing.  She has no erythema or warmth.  There is no exudate or discharge.  She has no chest wall  nodularity.  There is no axillary adenopathy bilaterally.  HENT:     Head: Normocephalic and atraumatic.  Eyes:     Pupils: Pupils are equal, round, and reactive to light.  Cardiovascular:     Rate and Rhythm: Normal rate and regular rhythm.     Heart sounds: Normal heart sounds.  Pulmonary:     Effort: Pulmonary effort is normal.     Breath sounds: Normal breath sounds.  Abdominal:     General: Bowel sounds are normal.     Palpations: Abdomen is soft.     Comments: Abdominal exam is soft.  There is no distention.  There  is some tenderness to palpation.  She has no fluid wave.  Bowel sounds are present.  There is no palpable liver or spleen tip.  Musculoskeletal:        General: No tenderness or deformity. Normal range of motion.     Cervical back: Normal range of motion.  Lymphadenopathy:     Cervical: No cervical adenopathy.  Skin:    General: Skin is warm and dry.     Findings: No erythema or rash.  Neurological:     Mental Status: She is alert and oriented to person, place, and time.  Psychiatric:        Behavior: Behavior normal.        Thought Content: Thought content normal.        Judgment: Judgment normal.     Lab Results  Component Value Date   WBC 7.4 06/28/2021   HGB 13.1 06/28/2021   HCT 41.0 06/28/2021   MCV 98.3 06/28/2021   PLT 293 06/28/2021     Chemistry      Component Value Date/Time   NA 145 06/28/2021 0800   K 4.0 06/28/2021 0800   CL 104 06/28/2021 0800   CO2 32 06/28/2021 0800   BUN 16 06/28/2021 0800   CREATININE 0.97 06/28/2021 0800   CREATININE 0.92 04/28/2017 1645      Component Value Date/Time   CALCIUM 10.3 06/28/2021 0800   ALKPHOS 75 06/28/2021 0800   AST 23 06/28/2021 0800   ALT 24 06/28/2021 0800   BILITOT 0.5 06/28/2021 0800      Impression and Plan: Ms. Focht is a very charming 61 year old African-American female.  She is postmenopausal.  She has a very early stage ductal carcinoma of the right breast.  Again, I do  suspect that with surgery alone, her chance of cure is probably 90%.  We will start on the Arimidex.  Hopefully, the Oncotype score will back at some point.  I still feel that she has an excellent prognosis.  I would have her on Arimidex for 7 years.  We will plan to get her back to see [place] in about 6 weeks.  She should have no problems with the reconstruction.  I am glad that she is able to have this done.  I am just happy that she feels a whole lot better than when we last saw her before Christmas.  Volanda Napoleon, MD 1/13/20238:45 AM

## 2021-06-28 NOTE — Progress Notes (Signed)
Received notification yesterday from genomic health that oncotype score testing would be cancelled as the lab has still not located the tissue block. Notified Dr Marin Olp.   Patient will proceed with AI.   Oncology Nurse Navigator Documentation  Oncology Nurse Navigator Flowsheets 06/28/2021  Abnormal Finding Date -  Confirmed Diagnosis Date -  Diagnosis Status -  Planned Course of Treatment AI  Phase of Treatment AI  Aromatase Inhibitor Actual Start Date: 06/29/2021  Expected Surgery Date -  Surgery Actual Start Date: -  Navigator Follow Up Date: 08/09/2021  Navigator Follow Up Reason: Follow-up Appointment  Navigator Location [facility]  Referral Date to RadOnc/MedOnc -  Navigator Encounter Type Follow-up Appt;Appt/Treatment Plan Review  Telephone -  Treatment Initiated Date -  Patient Visit Type MedOnc  Treatment Phase Active Tx  Barriers/Navigation Needs Coordination of Care;Education  Education -  Interventions Coordination of Care  Acuity Level 2-Minimal Needs (1-2 Barriers Identified)  Coordination of Care Pathology  Education Method -  Support Groups/Services Friends and Family  Time Spent with Patient 15

## 2021-06-30 ENCOUNTER — Other Ambulatory Visit: Payer: Self-pay

## 2021-06-30 ENCOUNTER — Other Ambulatory Visit: Payer: 59

## 2021-06-30 ENCOUNTER — Ambulatory Visit: Payer: 59

## 2021-07-01 ENCOUNTER — Ambulatory Visit: Payer: 59 | Admitting: Physical Therapy

## 2021-07-01 ENCOUNTER — Telehealth: Payer: Self-pay | Admitting: Family Medicine

## 2021-07-01 ENCOUNTER — Encounter: Payer: 59 | Admitting: Rehabilitation

## 2021-07-01 ENCOUNTER — Encounter: Payer: Self-pay | Admitting: Physical Therapy

## 2021-07-01 DIAGNOSIS — M25611 Stiffness of right shoulder, not elsewhere classified: Secondary | ICD-10-CM

## 2021-07-01 DIAGNOSIS — R293 Abnormal posture: Secondary | ICD-10-CM

## 2021-07-01 DIAGNOSIS — M25612 Stiffness of left shoulder, not elsewhere classified: Secondary | ICD-10-CM

## 2021-07-01 DIAGNOSIS — C50411 Malignant neoplasm of upper-outer quadrant of right female breast: Secondary | ICD-10-CM

## 2021-07-01 DIAGNOSIS — Z483 Aftercare following surgery for neoplasm: Secondary | ICD-10-CM

## 2021-07-01 DIAGNOSIS — Z170 Estrogen receptor positive status [ER+]: Secondary | ICD-10-CM | POA: Diagnosis not present

## 2021-07-01 NOTE — Therapy (Signed)
[facility] @ [address] [place], [place], [address] Phone: [phone]   Fax:  [phone]  Physical Therapy Treatment  Patient Details  Name: Katelyn Reyes MRN: [id number] Date of Birth: [birth year] Referring Provider (PT): Dr. Iran Planas   Encounter Date: 07/01/2021   PT End of Session - 07/01/21 0859     Visit Number 4    Number of Visits 9    Date for PT Re-Evaluation 07/29/21    PT Start Time 0808    PT Stop Time 0852    PT Time Calculation (min) 44 min    Activity Tolerance Patient tolerated treatment well    Behavior During Therapy [facility] for tasks assessed/performed             Past Medical History:  Diagnosis Date   Allergic rhinitis    Anxiety    Arthritis    Asthma    related to seasonal allergies   Breast calcification, right 02/02/2013   Surgically excised 02/17/13 > path benign    Cancer ([place])    Depression    Family history of breast cancer 04/12/2021   Family history of ovarian cancer 04/12/2021   GERD (gastroesophageal reflux disease)    Hyperlipidemia    IUD 02/07/2010    Past Surgical History:  Procedure Laterality Date   BREAST BIOPSY Right 02/17/2013   Procedure:  NEEDLE LOCALIZATION REMOVAL RIGHT BREAST CALCIFICATIONS;  Surgeon: Haywood Lasso, MD;  Location: [facility];  Service: General;  Laterality: Right;   BREAST EXCISIONAL BIOPSY Right pt unsure   benign   BREAST RECONSTRUCTION WITH PLACEMENT OF TISSUE EXPANDER AND ALLODERM Bilateral 04/26/2021   Procedure: BREAST RECONSTRUCTION WITH PLACEMENT OF TISSUE EXPANDER AND ALLODERM;  Surgeon: Irene Limbo, MD;  Location: [facility];  Service: Plastics;  Laterality: Bilateral;   BURCH PROCEDURE N/A 12/22/2013   Procedure: BURCH CYSTO URETHROPEXY, ANTERIOR AND POSTERIOR CULPORRHAPHY;  Surgeon: Ena Dawley, MD;  Location: [facility];  Service: Gynecology;  Laterality: N/A;   CERVICAL DISC ARTHROPLASTY  N/A 11/12/2017   Procedure: ARTIFICIAL DISC REPLACEMENT CERVICAL SIX-SEVEN;  Surgeon: Ashok Pall, MD;  Location: [facility];  Service: Neurosurgery;  Laterality: N/A;  ARTIFICIAL DISC REPLACEMENT CERVICAL 6- CERVICAL 7   CHOLECYSTECTOMY N/A 10/09/2017   Procedure: LAPAROSCOPIC CHOLECYSTECTOMY WITH INTRAOPERATIVE CHOLANGIOGRAM;  Surgeon: Judeth Horn, MD;  Location: [place] [facility];  Service: General;  Laterality: N/A;   KNEE ARTHROSCOPY WITH MEDIAL MENISECTOMY Right 05/01/2020   Procedure: KNEE ARTHROSCOPY WITH PARTIAL MEDIAL MENISECTOMY,   PATELLA  CHONDROPLASTY;  Surgeon: Paralee Cancel, MD;  Location: [facility];  Service: Orthopedics;  Laterality: Right;   MASTECTOMY W/ SENTINEL NODE BIOPSY Right 04/26/2021   Procedure: RIGHT MASTECTOMY WITH RIGHT SENTINEL LYMPH NODE BIOPSY;  Surgeon: [name], MD;  Location: [facility];  Service: General;  Laterality: Right;   MOUTH SURGERY     NASAL SINUS SURGERY     TOTAL MASTECTOMY Left 04/26/2021   Procedure: LEFT TOTAL MASTECTOMY;  Surgeon: [name], MD;  Location: [facility];  Service: General;  Laterality: Left;    There were no vitals filed for this visit.   Subjective Assessment - 07/01/21 0808     Subjective My expander exchange surgery is 07/23/21. I can not have the MRI on my shoulder until after I have the expanders removed. I saw the doctor and I might have a partial tear of the supraspinatus tendon.    Pertinent History R  breast cancer s/p bilateral mastectomies, IDC ER+, PR+, HER2-, 0/1 SLN, expanders placed    Patient Stated Goals to regain ROM and strength    Currently in Pain? Yes    Pain Score 4     Pain Location Shoulder    Pain Orientation Right    Pain Descriptors / Indicators Throbbing    Pain Type Acute pain    Pain Onset More than a month ago    Pain Frequency Constant    Aggravating Factors  trying to lift something    Pain Relieving Factors nothing    Effect of Pain on  Daily Activities takes longer to complete ADLs, has to use the other arm frequently                               [facility] Adult PT Treatment/Exercise - 07/01/21 0001       Manual Therapy   Soft tissue mobilization to R scapular musculature including rhomboids, lats, upper traps, teres and at upper R arm at insertion of rotator cuff tendons using cocoa butter in L S/L then to supine for STM to R pec - numerous areas of muscle tightness and trigger points noted throughout R upper quadrant                          PT Long Term Goals - 07/01/21 0859       PT LONG TERM GOAL #1   Title Pt will not demonstrate any cording in RUE to allow more comfort with ROM    Time 4    Period Weeks    Status On-going      PT LONG TERM GOAL #2   Title Pt will demonstrate 165 degrees of left shoulder flexion to allow her to reach overhead.    Baseline 56    Time 4    Period Weeks    Status On-going      PT LONG TERM GOAL #3   Title Pt will demonstrate 165 degrees of left shoulder abduction to allow her to reach out to the side    Baseline 67    Time 4    Period Weeks    Status On-going      PT LONG TERM GOAL #4   Title Pt will be independent in a home exercise program for continued stretching and strengthening    Time 4    Period Weeks    Status On-going                   Plan - 07/01/21 0901     Clinical Impression Statement Pt saw Dr. Georgina Snell regarding her R shoulder pain that has continued since she started therapy. He suspects she may have a tear in her supraspinatus tendon per x rays taken. She was supposed to have an MRI yesterday but was unable to have it completed due to her expanders. She will have to wait until she has her expander exchange surgery on Feb 7 before she can have an MRI. She reports her L shoulder pain has improved and her L shoulder ROM is progressing. Spent session today focusing on decreasing pain in R shoulder through soft  tissue mobilization. Pt reports great improvement in discomfort by end of session today. Will measure L shoulder ROM at next session. Sent a message to Dr. Georgina Snell to if pt is cleared to continue exercise or if  we should hold off until her MRI. Pt would benefit from additional skilled PT visits to continue to decrease pain in R shoulder if approved by her doctor and to continue to progress L shoulder ROM.    PT Frequency 2x / week    PT Duration 4 weeks    PT Treatment/Interventions ADLs/Self Care Home Management;Therapeutic exercise;Patient/family education;Manual techniques;Manual lymph drainage;Scar mobilization;Passive range of motion;Taping    PT Next Visit Plan how are isometrics?, was she cleared by ortho dr?, PROM bilateral shoulders, pulleys, ball, supine dowel if she does not have shoulder pain with it, re measure ROM, measure pt for prophylactic sleeve    PT Home Exercise Plan post op breast exercises, isometrics, scap retraction    Consulted and Agree with Plan of Care Patient             Patient will benefit from skilled therapeutic intervention in order to improve the following deficits and impairments:  Pain, Postural dysfunction, Impaired UE functional use, Impaired flexibility, Increased fascial restricitons, Decreased strength, Decreased range of motion, Decreased scar mobility  Visit Diagnosis: Stiffness of right shoulder, not elsewhere classified  Stiffness of left shoulder, not elsewhere classified  Aftercare following surgery for neoplasm  Abnormal posture  Malignant neoplasm of upper-outer quadrant of right breast in female, estrogen receptor positive ([place])     Problem List Patient Active Problem List   Diagnosis Date Noted   Breast cancer, right ([place]) 04/26/2021   Genetic testing 04/19/2021   Family history of breast cancer 04/12/2021   Malignant neoplasm of upper outer quadrant of female breast ([place]) 04/12/2021   Family history of colon cancer 04/12/2021    Family history of ovarian cancer 97/67/3419   Metabolic syndrome 37/90/2409   Change in bowel habit 07/17/2020   Diverticular disease of colon 07/17/2020   Family history of malignant neoplasm of gastrointestinal tract 07/17/2020   Fatty liver 07/17/2020   Morbid obesity ([place]) 07/17/2020   Nausea 73/53/2992   Nonalcoholic steatohepatitis ([place]) 07/17/2020   Personal history of colonic polyps 07/17/2020   Epigastric pain 07/17/2020   Acute medial meniscal tear, right, subsequent encounter 05/01/2020   ETD (Eustachian tube dysfunction), bilateral 12/27/2019   Obesity (BMI 30-39.9) 07/12/2018   Cervical spondylosis with radiculopathy 11/12/2017   Vitamin D deficiency 07/07/2017   Urinary incontinence in female 12/22/2013   Asthma, mild intermittent 03/22/2013   Vaginal itching 06/01/2012   Thyromegaly 03/18/2012   General medical examination 03/14/2011   CHICKENPOX, HX OF 02/15/2010   Hyperlipidemia 02/14/2009   DEPRESSION 02/14/2009   ALLERGIC RHINITIS 02/14/2009    Manus Gunning, PT 07/01/2021, 9:06 AM  [facility] @ [address] [place], [place], [address] Phone: [phone]   Fax:  [phone]  Name: Katelyn Reyes MRN: [id number] Date of Birth: 1961-04-24   Manus Gunning, PT 07/01/21 9:06 AM

## 2021-07-01 NOTE — Telephone Encounter (Signed)
Katelyn Reyes's was canceled for her shoulder.  She has a breast tissue expander which is not MRI compatible.  I was unaware of this point why ordered the MRI.  This is scheduled to be removed on February 7.  I asked her if she thinks she can wait until after that surgery before she proceeds to MRI and she said that she can.  We will reschedule MRI.

## 2021-07-03 ENCOUNTER — Other Ambulatory Visit: Payer: Self-pay

## 2021-07-03 ENCOUNTER — Encounter: Payer: Self-pay | Admitting: Physical Therapy

## 2021-07-03 ENCOUNTER — Ambulatory Visit: Payer: 59 | Admitting: Physical Therapy

## 2021-07-03 ENCOUNTER — Encounter: Payer: 59 | Admitting: Rehabilitation

## 2021-07-03 DIAGNOSIS — R293 Abnormal posture: Secondary | ICD-10-CM

## 2021-07-03 DIAGNOSIS — Z483 Aftercare following surgery for neoplasm: Secondary | ICD-10-CM

## 2021-07-03 DIAGNOSIS — M25611 Stiffness of right shoulder, not elsewhere classified: Secondary | ICD-10-CM

## 2021-07-03 DIAGNOSIS — M25612 Stiffness of left shoulder, not elsewhere classified: Secondary | ICD-10-CM | POA: Diagnosis not present

## 2021-07-03 DIAGNOSIS — C50411 Malignant neoplasm of upper-outer quadrant of right female breast: Secondary | ICD-10-CM

## 2021-07-03 DIAGNOSIS — Z170 Estrogen receptor positive status [ER+]: Secondary | ICD-10-CM | POA: Diagnosis not present

## 2021-07-03 NOTE — Therapy (Signed)
[facility] @ [place], [place], [address] Phone: [phone]   Fax:  [phone]  Physical Therapy Treatment  Patient Details  Name: Katelyn Reyes MRN: [id number] Date of Birth: 11/08/1960 Referring Provider (PT): Dr. Iran Planas   Encounter Date: 07/03/2021   PT End of Session - 07/03/21 0857     Visit Number 5    Number of Visits 9    Date for PT Re-Evaluation 07/29/21    PT Start Time 0806    PT Stop Time 0852    PT Time Calculation (min) 46 min    Activity Tolerance Patient tolerated treatment well    Behavior During Therapy [facility] for tasks assessed/performed             Past Medical History:  Diagnosis Date   Allergic rhinitis    Anxiety    Arthritis    Asthma    related to seasonal allergies   Breast calcification, right 02/02/2013   Surgically excised 02/17/13 > path benign    Cancer ([place])    Depression    Family history of breast cancer 04/12/2021   Family history of ovarian cancer 04/12/2021   GERD (gastroesophageal reflux disease)    Hyperlipidemia    IUD 02/07/2010    Past Surgical History:  Procedure Laterality Date   BREAST BIOPSY Right 02/17/2013   Procedure:  NEEDLE LOCALIZATION REMOVAL RIGHT BREAST CALCIFICATIONS;  Surgeon: Haywood Lasso, MD;  Location: [place];  Service: General;  Laterality: Right;   BREAST EXCISIONAL BIOPSY Right pt unsure   benign   BREAST RECONSTRUCTION WITH PLACEMENT OF TISSUE EXPANDER AND ALLODERM Bilateral 04/26/2021   Procedure: BREAST RECONSTRUCTION WITH PLACEMENT OF TISSUE EXPANDER AND ALLODERM;  Surgeon: Irene Limbo, MD;  Location: [place] [facility];  Service: Plastics;  Laterality: Bilateral;   BURCH PROCEDURE N/A 12/22/2013   Procedure: BURCH CYSTO URETHROPEXY, ANTERIOR AND POSTERIOR CULPORRHAPHY;  Surgeon: Ena Dawley, MD;  Location: [facility];  Service: Gynecology;  Laterality: N/A;   CERVICAL DISC ARTHROPLASTY  N/A 11/12/2017   Procedure: ARTIFICIAL DISC REPLACEMENT CERVICAL SIX-SEVEN;  Surgeon: Ashok Pall, MD;  Location: [facility];  Service: Neurosurgery;  Laterality: N/A;  ARTIFICIAL DISC REPLACEMENT CERVICAL 6- CERVICAL 7   CHOLECYSTECTOMY N/A 10/09/2017   Procedure: LAPAROSCOPIC CHOLECYSTECTOMY WITH INTRAOPERATIVE CHOLANGIOGRAM;  Surgeon: Judeth Horn, MD;  Location: [facility];  Service: General;  Laterality: N/A;   KNEE ARTHROSCOPY WITH MEDIAL MENISECTOMY Right 05/01/2020   Procedure: KNEE ARTHROSCOPY WITH PARTIAL MEDIAL MENISECTOMY,   PATELLA  CHONDROPLASTY;  Surgeon: Paralee Cancel, MD;  Location: [facility];  Service: Orthopedics;  Laterality: Right;   MASTECTOMY W/ SENTINEL NODE BIOPSY Right 04/26/2021   Procedure: RIGHT MASTECTOMY WITH RIGHT SENTINEL LYMPH NODE BIOPSY;  Surgeon: [name], MD;  Location: [place];  Service: General;  Laterality: Right;   MOUTH SURGERY     NASAL SINUS SURGERY     TOTAL MASTECTOMY Left 04/26/2021   Procedure: LEFT TOTAL MASTECTOMY;  Surgeon: [name], MD;  Location: [facility];  Service: General;  Laterality: Left;    There were no vitals filed for this visit.   Subjective Assessment - 07/03/21 0808     Subjective My shoulder does not feel tight like it had felt.    Pertinent History R breast cancer s/p bilateral mastectomies, IDC ER+, PR+, HER2-, 0/1 SLN, expanders placed    Patient Stated Goals to regain ROM and strength  Currently in Pain? Yes    Pain Score 2     Pain Location Shoulder    Pain Orientation Right    Pain Descriptors / Indicators Throbbing    Pain Onset More than a month ago    Pain Frequency Constant    Aggravating Factors  trying to lift something    Pain Relieving Factors nothing    Effect of Pain on Daily Activities takes longer to complete ADLs, has to use the other arm frequently                               [facility] Adult PT Treatment/Exercise -  07/03/21 0001       Manual Therapy   Soft tissue mobilization to R scapular musculature including rhomboids, lats, upper traps, teres and at upper R arm at insertion of rotator cuff tendons using cocoa butter in L S/L then to supine for STM to R pec - trigger points improved from last session                          PT Long Term Goals - 07/01/21 0859       PT LONG TERM GOAL #1   Title Pt will not demonstrate any cording in RUE to allow more comfort with ROM    Time 4    Period Weeks    Status On-going      PT LONG TERM GOAL #2   Title Pt will demonstrate 165 degrees of left shoulder flexion to allow her to reach overhead.    Baseline 56    Time 4    Period Weeks    Status On-going      PT LONG TERM GOAL #3   Title Pt will demonstrate 165 degrees of left shoulder abduction to allow her to reach out to the side    Baseline 67    Time 4    Period Weeks    Status On-going      PT LONG TERM GOAL #4   Title Pt will be independent in a home exercise program for continued stretching and strengthening    Time 4    Period Weeks    Status On-going                   Plan - 07/03/21 0902     Clinical Impression Statement Have not heard back from Dr. Georgina Snell yet regarding what pt is able to do in therapy. Continued with soft tissue mobilization since pt felt like this was helpful last session. She was much less tight today upon palpation and had fewer trigger points. Will continue soft tissue moiblization to help control pain and discomfort and proceed with exercise pending message from Dr. Georgina Snell.    PT Frequency 2x / week    PT Duration 4 weeks    PT Treatment/Interventions ADLs/Self Care Home Management;Therapeutic exercise;Patient/family education;Manual techniques;Manual lymph drainage;Scar mobilization;Passive range of motion;Taping    PT Next Visit Plan measure for a sleeve, how are isometrics?, was she cleared by ortho dr?, PROM bilateral shoulders,  pulleys, ball, supine dowel if she does not have shoulder pain with it, re measure ROM, measure pt for prophylactic sleeve    PT Home Exercise Plan post op breast exercises, isometrics, scap retraction    Consulted and Agree with Plan of Care Patient  Patient will benefit from skilled therapeutic intervention in order to improve the following deficits and impairments:  Pain, Postural dysfunction, Impaired UE functional use, Impaired flexibility, Increased fascial restricitons, Decreased strength, Decreased range of motion, Decreased scar mobility  Visit Diagnosis: Stiffness of right shoulder, not elsewhere classified  Stiffness of left shoulder, not elsewhere classified  Aftercare following surgery for neoplasm  Abnormal posture  Malignant neoplasm of upper-outer quadrant of right breast in female, estrogen receptor positive [facility])     Problem List Patient Active Problem List   Diagnosis Date Noted   Breast cancer, right ([place]) 04/26/2021   Genetic testing 04/19/2021   Family history of breast cancer 04/12/2021   Malignant neoplasm of upper outer quadrant of female breast ([place]) 04/12/2021   Family history of colon cancer 04/12/2021   Family history of ovarian cancer 32/95/1884   Metabolic syndrome 16/60/6301   Change in bowel habit 07/17/2020   Diverticular disease of colon 07/17/2020   Family history of malignant neoplasm of gastrointestinal tract 07/17/2020   Fatty liver 07/17/2020   Morbid obesity ([place]) 07/17/2020   Nausea 60/03/9322   Nonalcoholic steatohepatitis ([facility]) 07/17/2020   Personal history of colonic polyps 07/17/2020   Epigastric pain 07/17/2020   Acute medial meniscal tear, right, subsequent encounter 05/01/2020   ETD (Eustachian tube dysfunction), bilateral 12/27/2019   Obesity (BMI 30-39.9) 07/12/2018   Cervical spondylosis with radiculopathy 11/12/2017   Vitamin D deficiency 07/07/2017   Urinary incontinence in female 12/22/2013   Asthma,  mild intermittent 03/22/2013   Vaginal itching 06/01/2012   Thyromegaly 03/18/2012   General medical examination 03/14/2011   CHICKENPOX, HX OF 02/15/2010   Hyperlipidemia 02/14/2009   DEPRESSION 02/14/2009   ALLERGIC RHINITIS 02/14/2009    Manus Gunning, PT 07/03/2021, 9:07 AM  [facility] @ [place], [place], [address] Phone: [phone]   Fax:  [phone]  Name: RONISHA HERRINGSHAW MRN: [id number] Date of Birth: December 05, 1960   Manus Gunning, PT 07/03/21 9:07 AM

## 2021-07-08 ENCOUNTER — Encounter: Payer: 59 | Admitting: Rehabilitation

## 2021-07-08 ENCOUNTER — Ambulatory Visit: Payer: 59 | Admitting: Physical Therapy

## 2021-07-10 ENCOUNTER — Ambulatory Visit: Payer: 59 | Admitting: Physical Therapy

## 2021-07-12 ENCOUNTER — Other Ambulatory Visit (HOSPITAL_COMMUNITY): Payer: Self-pay

## 2021-07-15 ENCOUNTER — Encounter: Payer: Self-pay | Admitting: Physical Therapy

## 2021-07-15 ENCOUNTER — Other Ambulatory Visit (HOSPITAL_COMMUNITY): Payer: Self-pay

## 2021-07-15 ENCOUNTER — Encounter (HOSPITAL_BASED_OUTPATIENT_CLINIC_OR_DEPARTMENT_OTHER): Payer: Self-pay | Admitting: Plastic Surgery

## 2021-07-15 ENCOUNTER — Encounter: Payer: 59 | Admitting: Rehabilitation

## 2021-07-15 ENCOUNTER — Ambulatory Visit: Payer: 59 | Admitting: Physical Therapy

## 2021-07-15 ENCOUNTER — Other Ambulatory Visit: Payer: Self-pay

## 2021-07-15 DIAGNOSIS — Z170 Estrogen receptor positive status [ER+]: Secondary | ICD-10-CM

## 2021-07-15 DIAGNOSIS — M25612 Stiffness of left shoulder, not elsewhere classified: Secondary | ICD-10-CM

## 2021-07-15 DIAGNOSIS — C50411 Malignant neoplasm of upper-outer quadrant of right female breast: Secondary | ICD-10-CM

## 2021-07-15 DIAGNOSIS — R293 Abnormal posture: Secondary | ICD-10-CM

## 2021-07-15 DIAGNOSIS — Z483 Aftercare following surgery for neoplasm: Secondary | ICD-10-CM

## 2021-07-15 DIAGNOSIS — M25611 Stiffness of right shoulder, not elsewhere classified: Secondary | ICD-10-CM

## 2021-07-15 MED ORDER — CLENPIQ 10-3.5-12 MG-GM -GM/160ML PO SOLN
160.0000 mL | ORAL | 0 refills | Status: DC
  Filled 2021-07-15: qty 320, 1d supply, fill #0

## 2021-07-15 NOTE — Therapy (Signed)
[facility] @ [place], [place], [address] Phone: [phone]   Fax:  [phone]  Physical Therapy Treatment  Patient Details  Name: Katelyn Reyes MRN: [id number] Date of Birth: 04/05/1961 Referring Provider (PT): Dr. Iran Planas   Encounter Date: 07/15/2021   PT End of Session - 07/15/21 0850     Visit Number 6    Number of Visits 9    Date for PT Re-Evaluation 07/29/21    PT Start Time 0804    PT Stop Time 0539    PT Time Calculation (min) 48 min    Activity Tolerance Patient tolerated treatment well    Behavior During Therapy [facility] for tasks assessed/performed             Past Medical History:  Diagnosis Date   Allergic rhinitis    Anxiety    Arthritis    Asthma    related to seasonal allergies   Breast calcification, right 02/02/2013   Surgically excised 02/17/13 > path benign    Cancer ([place])    Depression    Family history of breast cancer 04/12/2021   Family history of ovarian cancer 04/12/2021   GERD (gastroesophageal reflux disease)    Hyperlipidemia    IUD 02/07/2010    Past Surgical History:  Procedure Laterality Date   BREAST BIOPSY Right 02/17/2013   Procedure:  NEEDLE LOCALIZATION REMOVAL RIGHT BREAST CALCIFICATIONS;  Surgeon: Haywood Lasso, MD;  Location: [facility];  Service: General;  Laterality: Right;   BREAST EXCISIONAL BIOPSY Right pt unsure   benign   BREAST RECONSTRUCTION WITH PLACEMENT OF TISSUE EXPANDER AND ALLODERM Bilateral 04/26/2021   Procedure: BREAST RECONSTRUCTION WITH PLACEMENT OF TISSUE EXPANDER AND ALLODERM;  Surgeon: Irene Limbo, MD;  Location: [facility];  Service: Plastics;  Laterality: Bilateral;   BURCH PROCEDURE N/A 12/22/2013   Procedure: BURCH CYSTO URETHROPEXY, ANTERIOR AND POSTERIOR CULPORRHAPHY;  Surgeon: Ena Dawley, MD;  Location: [facility];  Service: Gynecology;  Laterality: N/A;   CERVICAL DISC ARTHROPLASTY  N/A 11/12/2017   Procedure: ARTIFICIAL DISC REPLACEMENT CERVICAL SIX-SEVEN;  Surgeon: Ashok Pall, MD;  Location: [facility];  Service: Neurosurgery;  Laterality: N/A;  ARTIFICIAL DISC REPLACEMENT CERVICAL 6- CERVICAL 7   CHOLECYSTECTOMY N/A 10/09/2017   Procedure: LAPAROSCOPIC CHOLECYSTECTOMY WITH INTRAOPERATIVE CHOLANGIOGRAM;  Surgeon: Judeth Horn, MD;  Location: [facility];  Service: General;  Laterality: N/A;   KNEE ARTHROSCOPY WITH MEDIAL MENISECTOMY Right 05/01/2020   Procedure: KNEE ARTHROSCOPY WITH PARTIAL MEDIAL MENISECTOMY,   PATELLA  CHONDROPLASTY;  Surgeon: Paralee Cancel, MD;  [facility];  Service: Orthopedics;  Laterality: Right;   MASTECTOMY W/ SENTINEL NODE BIOPSY Right 04/26/2021   Procedure: RIGHT MASTECTOMY WITH RIGHT SENTINEL LYMPH NODE BIOPSY;  Surgeon: [name], MD;  Location: [facility];  Service: General;  Laterality: Right;   MOUTH SURGERY     NASAL SINUS SURGERY     TOTAL MASTECTOMY Left 04/26/2021   Procedure: LEFT TOTAL MASTECTOMY;  Surgeon: [name], MD;  Location: [facility];  Service: General;  Laterality: Left;    There were no vitals filed for this visit.   Subjective Assessment - 07/15/21 0805     Subjective My shoulder has been really throbbing.    Pertinent History R breast cancer s/p bilateral mastectomies, IDC ER+, PR+, HER2-, 0/1 SLN, expanders placed    Patient Stated Goals to regain ROM and strength    Currently in Pain? Yes  Pain Score 5     Pain Location Shoulder    Pain Orientation Right    Pain Descriptors / Indicators Throbbing    Pain Type Acute pain    Pain Onset More than a month ago    Pain Frequency Constant    Aggravating Factors  trying to lift something    Pain Relieving Factors nothing    Effect of Pain on Daily Activities takes longer to complete ADLs, has to use the other arm frequently                               [facility] Adult PT  Treatment/Exercise - 07/15/21 0001       Manual Therapy   Soft tissue mobilization to R scapular musculature including rhomboids, lats, upper traps, teres and at upper R arm at insertion of rotator cuff tendons using cocoa butter in L S/L                          PT Long Term Goals - 07/01/21 0859       PT LONG TERM GOAL #1   Title Pt will not demonstrate any cording in RUE to allow more comfort with ROM    Time 4    Period Weeks    Status On-going      PT LONG TERM GOAL #2   Title Pt will demonstrate 165 degrees of left shoulder flexion to allow her to reach overhead.    Baseline 56    Time 4    Period Weeks    Status On-going      PT LONG TERM GOAL #3   Title Pt will demonstrate 165 degrees of left shoulder abduction to allow her to reach out to the side    Baseline 67    Time 4    Period Weeks    Status On-going      PT LONG TERM GOAL #4   Title Pt will be independent in a home exercise program for continued stretching and strengthening    Time 4    Period Weeks    Status On-going                   Plan - 07/15/21 7322     Clinical Impression Statement Pt is having increased throbbing pain today in her shoulder. She reports she has just been resting it. Continued with soft tissue mobilization to help decrease muscle tightness. She has numerous trigger points especially in lats and serratus. Improvements noted in trigger points by end of session. Pt to be placed on hold until after her surgery for implant exchange. She has a colonoscopy scheduled for Wednesday so she had to cancel her PT appt that day.    PT Frequency 2x / week    PT Duration 4 weeks    PT Treatment/Interventions ADLs/Self Care Home Management;Therapeutic exercise;Patient/family education;Manual techniques;Manual lymph drainage;Scar mobilization;Passive range of motion;Taping    PT Next Visit Plan measure for a sleeve, how was implant exchange surgery, PROM bilateral shoulders,  pulleys, ball, supine dowel if she does not have shoulder pain with it, re measure ROM, measure pt for prophylactic sleeve    PT Home Exercise Plan post op breast exercises, isometrics, scap retraction    Consulted and Agree with Plan of Care Patient             Patient will benefit from  skilled therapeutic intervention in order to improve the following deficits and impairments:  Pain, Postural dysfunction, Impaired UE functional use, Impaired flexibility, Increased fascial restricitons, Decreased strength, Decreased range of motion, Decreased scar mobility  Visit Diagnosis: Stiffness of right shoulder, not elsewhere classified  Stiffness of left shoulder, not elsewhere classified  Aftercare following surgery for neoplasm  Abnormal posture  Malignant neoplasm of upper-outer quadrant of right breast in female, estrogen receptor positive ([place])     Problem List Patient Active Problem List   Diagnosis Date Noted   Breast cancer, right ([place]) 04/26/2021   Genetic testing 04/19/2021   Family history of breast cancer 04/12/2021   Malignant neoplasm of upper outer quadrant of female breast ([place]) 04/12/2021   Family history of colon cancer 04/12/2021   Family history of ovarian cancer 15/83/0940   Metabolic syndrome 76/80/8811   Change in bowel habit 07/17/2020   Diverticular disease of colon 07/17/2020   Family history of malignant neoplasm of gastrointestinal tract 07/17/2020   Fatty liver 07/17/2020   Morbid obesity ([place]) 07/17/2020   Nausea 08/28/9456   Nonalcoholic steatohepatitis ([facility]) 07/17/2020   Personal history of colonic polyps 07/17/2020   Epigastric pain 07/17/2020   Acute medial meniscal tear, right, subsequent encounter 05/01/2020   ETD (Eustachian tube dysfunction), bilateral 12/27/2019   Obesity (BMI 30-39.9) 07/12/2018   Cervical spondylosis with radiculopathy 11/12/2017   Vitamin D deficiency 07/07/2017   Urinary incontinence in female 12/22/2013   Asthma,  mild intermittent 03/22/2013   Vaginal itching 06/01/2012   Thyromegaly 03/18/2012   General medical examination 03/14/2011   CHICKENPOX, HX OF 02/15/2010   Hyperlipidemia 02/14/2009   DEPRESSION 02/14/2009   ALLERGIC RHINITIS 02/14/2009    Manus Gunning, PT 07/15/2021, 8:58 AM  [facility] @ [place], [place], [address] Phone: [phone]   Fax:  [phone]  Name: Katelyn Reyes MRN: [id number] Date of Birth: 07-02-1960

## 2021-07-16 ENCOUNTER — Telehealth: Payer: Self-pay

## 2021-07-16 ENCOUNTER — Ambulatory Visit (HOSPITAL_BASED_OUTPATIENT_CLINIC_OR_DEPARTMENT_OTHER)
Admission: RE | Admit: 2021-07-16 | Discharge: 2021-07-16 | Disposition: A | Discharge status: Home / Self Care | Payer: 59 | Source: Ambulatory Visit | Attending: Hematology & Oncology | Admitting: Hematology & Oncology

## 2021-07-16 DIAGNOSIS — C50411 Malignant neoplasm of upper-outer quadrant of right female breast: Secondary | ICD-10-CM | POA: Diagnosis not present

## 2021-07-16 DIAGNOSIS — Z780 Asymptomatic menopausal state: Secondary | ICD-10-CM | POA: Diagnosis not present

## 2021-07-16 NOTE — Telephone Encounter (Signed)
-----   Message from Volanda Napoleon, MD sent at 07/16/2021 12:48 PM EST ----- Please call her and let her know that the bone density test is normal.  There is no osteoporosis.  Thanks.  Laurey Arrow

## 2021-07-16 NOTE — Progress Notes (Signed)

## 2021-07-16 NOTE — Telephone Encounter (Signed)
Called and informed pt of bone density results, she confirms and denies any questions or concerns at this time.

## 2021-07-17 ENCOUNTER — Encounter: Payer: 59 | Admitting: Rehabilitation

## 2021-07-17 ENCOUNTER — Telehealth: Payer: Self-pay | Admitting: *Deleted

## 2021-07-17 ENCOUNTER — Encounter: Payer: 59 | Admitting: Physical Therapy

## 2021-07-17 DIAGNOSIS — D120 Benign neoplasm of cecum: Secondary | ICD-10-CM | POA: Diagnosis not present

## 2021-07-17 DIAGNOSIS — D122 Benign neoplasm of ascending colon: Secondary | ICD-10-CM | POA: Diagnosis not present

## 2021-07-17 DIAGNOSIS — K5730 Diverticulosis of large intestine without perforation or abscess without bleeding: Secondary | ICD-10-CM | POA: Diagnosis not present

## 2021-07-17 DIAGNOSIS — K635 Polyp of colon: Secondary | ICD-10-CM | POA: Diagnosis not present

## 2021-07-17 DIAGNOSIS — Z1211 Encounter for screening for malignant neoplasm of colon: Secondary | ICD-10-CM | POA: Diagnosis not present

## 2021-07-17 NOTE — Telephone Encounter (Signed)
Call received from [place] with [employer] to introduce herself as patient's Nurse Case Manager to be reached at [phone] ext [phone].

## 2021-07-19 ENCOUNTER — Telehealth: Payer: Self-pay

## 2021-07-19 ENCOUNTER — Ambulatory Visit (INDEPENDENT_AMBULATORY_CARE_PROVIDER_SITE_OTHER): Payer: 59 | Admitting: Family Medicine

## 2021-07-19 ENCOUNTER — Encounter: Payer: Self-pay | Admitting: Family Medicine

## 2021-07-19 VITALS — BP 100/72 | HR 81 | Temp 98.2°F | Resp 16 | Ht 62.0 in | Wt 179.0 lb

## 2021-07-19 DIAGNOSIS — Z0000 Encounter for general adult medical examination without abnormal findings: Secondary | ICD-10-CM

## 2021-07-19 DIAGNOSIS — E559 Vitamin D deficiency, unspecified: Secondary | ICD-10-CM | POA: Diagnosis not present

## 2021-07-19 DIAGNOSIS — E669 Obesity, unspecified: Secondary | ICD-10-CM | POA: Diagnosis not present

## 2021-07-19 DIAGNOSIS — Z111 Encounter for screening for respiratory tuberculosis: Secondary | ICD-10-CM

## 2021-07-19 LAB — CBC WITH DIFFERENTIAL/PLATELET
Basophils Absolute: 0 10*3/uL (ref 0.0–0.1)
Basophils Relative: 0.5 % (ref 0.0–3.0)
Eosinophils Absolute: 0.1 10*3/uL (ref 0.0–0.7)
Eosinophils Relative: 2.4 % (ref 0.0–5.0)
HCT: 37.2 % (ref 36.0–46.0)
Hemoglobin: 12.2 g/dL (ref 12.0–15.0)
Lymphocytes Relative: 49.1 % — ABNORMAL HIGH (ref 12.0–46.0)
Lymphs Abs: 2.8 10*3/uL (ref 0.7–4.0)
MCHC: 32.9 g/dL (ref 30.0–36.0)
MCV: 93 fl (ref 78.0–100.0)
Monocytes Absolute: 0.7 10*3/uL (ref 0.1–1.0)
Monocytes Relative: 11.8 % (ref 3.0–12.0)
Neutro Abs: 2.1 10*3/uL (ref 1.4–7.7)
Neutrophils Relative %: 36.2 % — ABNORMAL LOW (ref 43.0–77.0)
Platelets: 307 10*3/uL (ref 150.0–400.0)
RBC: 4 Mil/uL (ref 3.87–5.11)
RDW: 12.6 % (ref 11.5–15.5)
WBC: 5.8 10*3/uL (ref 4.0–10.5)

## 2021-07-19 LAB — BASIC METABOLIC PANEL
BUN: 12 mg/dL (ref 6–23)
CO2: 34 mEq/L — ABNORMAL HIGH (ref 19–32)
Calcium: 9.2 mg/dL (ref 8.4–10.5)
Chloride: 103 mEq/L (ref 96–112)
Creatinine, Ser: 0.93 mg/dL (ref 0.40–1.20)
GFR: 67.93 mL/min (ref >60.00)
Glucose, Bld: 93 mg/dL (ref 70–99)
Potassium: 3.5 mEq/L (ref 3.5–5.1)
Sodium: 141 mEq/L (ref 135–145)

## 2021-07-19 LAB — LIPID PANEL
Cholesterol: 161 mg/dL (ref 0–200)
HDL: 57.8 mg/dL (ref >39.00)
LDL Cholesterol: 89 mg/dL (ref 0–99)
NonHDL: 102.85
Total CHOL/HDL Ratio: 3
Triglycerides: 70 mg/dL (ref 0.0–149.0)
VLDL: 14 mg/dL (ref 0.0–40.0)

## 2021-07-19 LAB — TSH: TSH: 3.01 u[IU]/mL (ref 0.35–5.50)

## 2021-07-19 LAB — HEPATIC FUNCTION PANEL
ALT: 21 U/L (ref 0–35)
AST: 19 U/L (ref 0–37)
Albumin: 4 g/dL (ref 3.5–5.2)
Alkaline Phosphatase: 66 U/L (ref 39–117)
Bilirubin, Direct: 0.1 mg/dL (ref 0.0–0.3)
Total Bilirubin: 0.4 mg/dL (ref 0.2–1.2)
Total Protein: 6.8 g/dL (ref 6.0–8.3)

## 2021-07-19 LAB — VITAMIN D 25 HYDROXY (VIT D DEFICIENCY, FRACTURES): VITD: 33.68 ng/mL (ref 30.00–100.00)

## 2021-07-19 NOTE — Patient Instructions (Addendum)
Follow up in 1 year or as needed We'll notify you of your lab results and make any changes if needed Continue to work on healthy diet and regular exercise- you're doing great!!! Call with any questions or concerns Stay Safe!  Stay Healthy! GOOD LUCK NEXT WEEK!!!!

## 2021-07-19 NOTE — Assessment & Plan Note (Signed)
Check labs and replete prn. 

## 2021-07-19 NOTE — Assessment & Plan Note (Signed)
Pt's PE WNL w/ exception of obesity.  UTD on mammo, pap, colonoscopy, Tdap, flu, and shingles.  Check labs.  Anticipatory guidance provided.

## 2021-07-19 NOTE — Progress Notes (Signed)
° °  Subjective:    Patient ID: [name], female    DOB: [birth year], 61 y.o.   MRN: [id number]  HPI CPE- UTD on mammo, pap, Tdap, colonoscopy, flu, and shingles  Patient Care Team    Relationship Specialty Notifications Start End  Midge Minium, MD PCP - General   05/29/10    Comment: Nestor Lewandowsky, MD Consulting Physician Obstetrics and Gynecology  06/06/15   Juanita Craver, MD Consulting Physician Gastroenterology  07/17/20   Volanda Napoleon, MD Medical Oncologist Oncology  04/02/21   Cordelia Poche, RN Oncology Nurse Navigator   04/02/21     Health Maintenance  Topic Date Due   COVID-19 Vaccine (5 - Booster for Pfizer series) 11/22/2020   Hepatitis C Screening  01/10/2022 (Originally 06/02/1981)   HIV Screening  01/10/2022 (Originally 06/02/1978)   MAMMOGRAM  02/25/2022   PAP SMEAR-Modifier  03/23/2023   TETANUS/TDAP  03/23/2023   COLONOSCOPY (Pts 45-50yrs Insurance coverage will need to be confirmed)  07/17/2026   INFLUENZA VACCINE  Completed   Zoster Vaccines- Shingrix  Completed   HPV VACCINES  Aged Out      Review of Systems Patient reports no vision/ hearing changes, adenopathy,fever, weight change,  persistant/recurrent hoarseness , swallowing issues, chest pain, palpitations, edema, persistant/recurrent cough, hemoptysis, dyspnea (rest/exertional/paroxysmal nocturnal), gastrointestinal bleeding (melena, rectal bleeding), abdominal pain, significant heartburn, bowel changes, GU symptoms (dysuria, hematuria, incontinence), Gyn symptoms (abnormal  bleeding, pain),  syncope, focal weakness, memory loss, numbness & tingling, skin/hair/nail changes, abnormal bruising or bleeding, anxiety, or depression.   This visit occurred during the SARS-CoV-2 public health emergency.  Safety protocols were in place, including screening questions prior to the visit, additional usage of staff PPE, and extensive cleaning of exam room while observing appropriate contact time  as indicated for disinfecting solutions.      Objective:   Physical Exam General Appearance:    Alert, cooperative, no distress, appears stated age  Head:    Normocephalic, without obvious abnormality, atraumatic  Eyes:    PERRL, conjunctiva/corneas clear, EOM's intact, fundi    benign, both eyes  Ears:    Normal TM's and external ear canals, both ears  Nose:   Deferred due to COVID  Throat:   Neck:   Supple, symmetrical, trachea midline, no adenopathy;    Thyroid: no enlargement/tenderness/nodules  Back:     Symmetric, no curvature, ROM normal, no CVA tenderness  Lungs:     Clear to auscultation bilaterally, respirations unlabored  Chest Wall:    No tenderness or deformity   Heart:    Regular rate and rhythm, S1 and S2 normal, no murmur, rub   or gallop  Breast Exam:    Deferred to GYN  Abdomen:     Soft, non-tender, bowel sounds active all four quadrants,    no masses, no organomegaly  Genitalia:    Deferred to GYN  Rectal:    Extremities:   Extremities normal, atraumatic, no cyanosis or edema  Pulses:   2+ and symmetric all extremities  Skin:   Skin color, texture, turgor normal, no rashes or lesions  Lymph nodes:   Cervical, supraclavicular, and axillary nodes normal  Neurologic:   CNII-XII intact, normal strength, sensation and reflexes    throughout          Assessment & Plan:

## 2021-07-19 NOTE — Assessment & Plan Note (Signed)
BMI now 32.74.  Pt is doing well on Ozempic.  Check labs to risk stratify.  Will follow.

## 2021-07-19 NOTE — Telephone Encounter (Signed)
Patient aware of labs.  

## 2021-07-19 NOTE — Telephone Encounter (Signed)
-----   Message from Midge Minium, MD sent at 07/19/2021 12:46 PM EST ----- Labs look great!  No changes at this time

## 2021-07-23 ENCOUNTER — Other Ambulatory Visit: Payer: Self-pay

## 2021-07-23 ENCOUNTER — Encounter (HOSPITAL_BASED_OUTPATIENT_CLINIC_OR_DEPARTMENT_OTHER): Payer: Self-pay | Admitting: Plastic Surgery

## 2021-07-23 ENCOUNTER — Ambulatory Visit (HOSPITAL_BASED_OUTPATIENT_CLINIC_OR_DEPARTMENT_OTHER): Payer: 59 | Admitting: Anesthesiology

## 2021-07-23 ENCOUNTER — Ambulatory Visit (HOSPITAL_BASED_OUTPATIENT_CLINIC_OR_DEPARTMENT_OTHER)
Admission: RE | Admit: 2021-07-23 | Discharge: 2021-07-23 | Disposition: A | Discharge status: Home / Self Care | Payer: 59 | Attending: Plastic Surgery | Admitting: Plastic Surgery

## 2021-07-23 ENCOUNTER — Encounter (HOSPITAL_BASED_OUTPATIENT_CLINIC_OR_DEPARTMENT_OTHER)
Admission: RE | Disposition: A | Discharge status: Home / Self Care | Payer: Self-pay | Source: Home / Self Care | Attending: Plastic Surgery

## 2021-07-23 DIAGNOSIS — Z45811 Encounter for adjustment or removal of right breast implant: Secondary | ICD-10-CM | POA: Diagnosis not present

## 2021-07-23 DIAGNOSIS — Z853 Personal history of malignant neoplasm of breast: Secondary | ICD-10-CM | POA: Insufficient documentation

## 2021-07-23 DIAGNOSIS — F32A Depression, unspecified: Secondary | ICD-10-CM | POA: Diagnosis not present

## 2021-07-23 DIAGNOSIS — K219 Gastro-esophageal reflux disease without esophagitis: Secondary | ICD-10-CM | POA: Insufficient documentation

## 2021-07-23 DIAGNOSIS — M1990 Unspecified osteoarthritis, unspecified site: Secondary | ICD-10-CM | POA: Insufficient documentation

## 2021-07-23 DIAGNOSIS — Z421 Encounter for breast reconstruction following mastectomy: Secondary | ICD-10-CM | POA: Diagnosis not present

## 2021-07-23 DIAGNOSIS — F419 Anxiety disorder, unspecified: Secondary | ICD-10-CM | POA: Diagnosis not present

## 2021-07-23 DIAGNOSIS — Z170 Estrogen receptor positive status [ER+]: Secondary | ICD-10-CM | POA: Diagnosis not present

## 2021-07-23 DIAGNOSIS — Z9013 Acquired absence of bilateral breasts and nipples: Secondary | ICD-10-CM | POA: Diagnosis not present

## 2021-07-23 DIAGNOSIS — Z45812 Encounter for adjustment or removal of left breast implant: Secondary | ICD-10-CM | POA: Diagnosis not present

## 2021-07-23 DIAGNOSIS — F418 Other specified anxiety disorders: Secondary | ICD-10-CM | POA: Diagnosis not present

## 2021-07-23 HISTORY — PX: REMOVAL OF BILATERAL TISSUE EXPANDERS WITH PLACEMENT OF BILATERAL BREAST IMPLANTS: SHX6431

## 2021-07-23 HISTORY — PX: LIPOSUCTION WITH LIPOFILLING: SHX6436

## 2021-07-23 SURGERY — REMOVAL, TISSUE EXPANDER, BREAST, BILATERAL, WITH BILATERAL IMPLANT IMPLANT INSERTION
Anesthesia: General | Site: Chest | Laterality: Bilateral

## 2021-07-23 MED ORDER — SODIUM CHLORIDE 0.9 % IV SOLN
INTRAVENOUS | Status: AC
  Filled 2021-07-23: qty 10

## 2021-07-23 MED ORDER — ROCURONIUM BROMIDE 100 MG/10ML IV SOLN
INTRAVENOUS | Status: DC | PRN
Start: 2021-07-23 — End: 2021-07-23
  Administered 2021-07-23: 70 mg via INTRAVENOUS

## 2021-07-23 MED ORDER — HYDROMORPHONE HCL 1 MG/ML IJ SOLN
INTRAMUSCULAR | Status: AC
  Filled 2021-07-23: qty 0.5

## 2021-07-23 MED ORDER — GENTAMICIN SULFATE 40 MG/ML IJ SOLN
INTRAMUSCULAR | Status: DC | PRN
  Administered 2021-07-23: 500 mL

## 2021-07-23 MED ORDER — DEXAMETHASONE SODIUM PHOSPHATE 4 MG/ML IJ SOLN
INTRAMUSCULAR | Status: DC | PRN
Start: 2021-07-23 — End: 2021-07-23
  Administered 2021-07-23: 5 mg via INTRAVENOUS

## 2021-07-23 MED ORDER — PHENYLEPHRINE HCL (PRESSORS) 10 MG/ML IV SOLN
INTRAVENOUS | Status: DC | PRN
Start: 2021-07-23 — End: 2021-07-23
  Administered 2021-07-23 (×2): 80 ug via INTRAVENOUS

## 2021-07-23 MED ORDER — SUGAMMADEX SODIUM 500 MG/5ML IV SOLN
INTRAVENOUS | Status: AC
  Filled 2021-07-23: qty 5

## 2021-07-23 MED ORDER — SUCCINYLCHOLINE CHLORIDE 200 MG/10ML IV SOSY
PREFILLED_SYRINGE | INTRAVENOUS | Status: AC
  Filled 2021-07-23: qty 10

## 2021-07-23 MED ORDER — PROPOFOL 10 MG/ML IV BOLUS
INTRAVENOUS | Status: DC | PRN
  Administered 2021-07-23: 150 mg via INTRAVENOUS

## 2021-07-23 MED ORDER — DEXAMETHASONE SODIUM PHOSPHATE 10 MG/ML IJ SOLN
INTRAMUSCULAR | Status: AC
  Filled 2021-07-23: qty 1

## 2021-07-23 MED ORDER — FENTANYL CITRATE (PF) 100 MCG/2ML IJ SOLN
INTRAMUSCULAR | Status: DC | PRN
  Administered 2021-07-23: 200 ug via INTRAVENOUS

## 2021-07-23 MED ORDER — HYDROMORPHONE HCL 1 MG/ML IJ SOLN
0.2500 mg | INTRAMUSCULAR | Status: DC | PRN
  Administered 2021-07-23 (×2): 0.5 mg via INTRAVENOUS

## 2021-07-23 MED ORDER — EPHEDRINE 5 MG/ML INJ
INTRAVENOUS | Status: AC
  Filled 2021-07-23: qty 5

## 2021-07-23 MED ORDER — EPINEPHRINE PF 1 MG/ML IJ SOLN
INTRAMUSCULAR | Status: AC
  Filled 2021-07-23: qty 1

## 2021-07-23 MED ORDER — FENTANYL CITRATE (PF) 100 MCG/2ML IJ SOLN
INTRAMUSCULAR | Status: AC
  Filled 2021-07-23: qty 2

## 2021-07-23 MED ORDER — PHENYLEPHRINE HCL (PRESSORS) 10 MG/ML IV SOLN
INTRAVENOUS | Status: AC
  Filled 2021-07-23: qty 1

## 2021-07-23 MED ORDER — LIDOCAINE 2% (20 MG/ML) 5 ML SYRINGE
INTRAMUSCULAR | Status: AC
  Filled 2021-07-23: qty 5

## 2021-07-23 MED ORDER — SODIUM BICARBONATE 4.2 % IV SOLN
INTRAVENOUS | Status: DC | PRN
  Administered 2021-07-23: 500 mL via INTRAMUSCULAR

## 2021-07-23 MED ORDER — OXYCODONE HCL 5 MG/5ML PO SOLN: 5.0000 mg | Freq: Once | ORAL | Status: DC | PRN

## 2021-07-23 MED ORDER — SUGAMMADEX SODIUM 500 MG/5ML IV SOLN
INTRAVENOUS | Status: DC | PRN
  Administered 2021-07-23: 250 mg via INTRAVENOUS

## 2021-07-23 MED ORDER — BUPIVACAINE HCL (PF) 0.25 % IJ SOLN
INTRAMUSCULAR | Status: AC
  Filled 2021-07-23: qty 30

## 2021-07-23 MED ORDER — LACTATED RINGERS IV SOLN: INTRAVENOUS | Status: DC

## 2021-07-23 MED ORDER — ACETAMINOPHEN 500 MG PO TABS
1000.0000 mg | ORAL_TABLET | ORAL | Status: AC
  Administered 2021-07-23: 1000 mg via ORAL

## 2021-07-23 MED ORDER — OXYCODONE HCL 5 MG PO TABS: 5.0000 mg | ORAL_TABLET | Freq: Once | ORAL | Status: DC | PRN

## 2021-07-23 MED ORDER — CIPROFLOXACIN IN D5W 400 MG/200ML IV SOLN
INTRAVENOUS | Status: AC
  Filled 2021-07-23: qty 200

## 2021-07-23 MED ORDER — MIDAZOLAM HCL 5 MG/5ML IJ SOLN
INTRAMUSCULAR | Status: DC | PRN
  Administered 2021-07-23: 2 mg via INTRAVENOUS

## 2021-07-23 MED ORDER — CHLORHEXIDINE GLUCONATE CLOTH 2 % EX PADS: 6.0000 | MEDICATED_PAD | Freq: Once | CUTANEOUS | Status: DC

## 2021-07-23 MED ORDER — PHENYLEPHRINE HCL-NACL 20-0.9 MG/250ML-% IV SOLN
INTRAVENOUS | Status: DC | PRN
  Administered 2021-07-23: 40 ug/min via INTRAVENOUS

## 2021-07-23 MED ORDER — LIDOCAINE HCL (PF) 1 % IJ SOLN
INTRAMUSCULAR | Status: AC
  Filled 2021-07-23: qty 30

## 2021-07-23 MED ORDER — MIDAZOLAM HCL 2 MG/2ML IJ SOLN
INTRAMUSCULAR | Status: AC
  Filled 2021-07-23: qty 2

## 2021-07-23 MED ORDER — ONDANSETRON HCL 4 MG/2ML IJ SOLN: 4.0000 mg | Freq: Once | INTRAMUSCULAR | Status: DC | PRN

## 2021-07-23 MED ORDER — ONDANSETRON HCL 4 MG/2ML IJ SOLN
INTRAMUSCULAR | Status: DC | PRN
  Administered 2021-07-23: 4 mg via INTRAVENOUS

## 2021-07-23 MED ORDER — LIDOCAINE HCL (CARDIAC) PF 100 MG/5ML IV SOSY
PREFILLED_SYRINGE | INTRAVENOUS | Status: DC | PRN
Start: 2021-07-23 — End: 2021-07-23
  Administered 2021-07-23: 60 mg via INTRAVENOUS

## 2021-07-23 MED ORDER — ACETAMINOPHEN 500 MG PO TABS
ORAL_TABLET | ORAL | Status: AC
  Filled 2021-07-23: qty 2

## 2021-07-23 MED ORDER — CELECOXIB 200 MG PO CAPS
ORAL_CAPSULE | ORAL | Status: AC
  Filled 2021-07-23: qty 1

## 2021-07-23 MED ORDER — PHENYLEPHRINE 40 MCG/ML (10ML) SYRINGE FOR IV PUSH (FOR BLOOD PRESSURE SUPPORT)
PREFILLED_SYRINGE | INTRAVENOUS | Status: AC
  Filled 2021-07-23: qty 10

## 2021-07-23 MED ORDER — GABAPENTIN 300 MG PO CAPS
ORAL_CAPSULE | ORAL | Status: AC
  Filled 2021-07-23: qty 1

## 2021-07-23 MED ORDER — EPHEDRINE SULFATE (PRESSORS) 50 MG/ML IJ SOLN
INTRAMUSCULAR | Status: DC | PRN
  Administered 2021-07-23: 15 mg via INTRAVENOUS

## 2021-07-23 MED ORDER — ONDANSETRON HCL 4 MG/2ML IJ SOLN
INTRAMUSCULAR | Status: AC
  Filled 2021-07-23: qty 2

## 2021-07-23 MED ORDER — ATROPINE SULFATE 0.4 MG/ML IV SOLN
INTRAVENOUS | Status: AC
  Filled 2021-07-23: qty 1

## 2021-07-23 MED ORDER — CIPROFLOXACIN IN D5W 400 MG/200ML IV SOLN
400.0000 mg | INTRAVENOUS | Status: AC
  Administered 2021-07-23: 400 mg via INTRAVENOUS

## 2021-07-23 MED ORDER — CELECOXIB 200 MG PO CAPS
200.0000 mg | ORAL_CAPSULE | ORAL | Status: AC
  Administered 2021-07-23: 200 mg via ORAL

## 2021-07-23 MED ORDER — GABAPENTIN 300 MG PO CAPS
300.0000 mg | ORAL_CAPSULE | ORAL | Status: AC
  Administered 2021-07-23: 300 mg via ORAL

## 2021-07-23 MED ORDER — SODIUM BICARBONATE 4.2 % IV SOLN
INTRAVENOUS | Status: AC
  Filled 2021-07-23: qty 10

## 2021-07-23 MED ORDER — BUPIVACAINE-EPINEPHRINE (PF) 0.25% -1:200000 IJ SOLN
INTRAMUSCULAR | Status: AC
  Filled 2021-07-23: qty 30

## 2021-07-23 SURGICAL SUPPLY — 59 items
ADH SKN CLS APL DERMABOND .7 (GAUZE/BANDAGES/DRESSINGS) ×2
APL PRP STRL LF DISP 70% ISPRP (MISCELLANEOUS) ×1
BAG DECANTER FOR FLEXI CONT (MISCELLANEOUS) ×2 IMPLANT
BINDER ABDOMINAL 10 UNV 27-48 (MISCELLANEOUS) ×1 IMPLANT
BINDER BREAST XXLRG (GAUZE/BANDAGES/DRESSINGS) ×1 IMPLANT
BLADE SURG 10 STRL SS (BLADE) ×4 IMPLANT
BLADE SURG 11 STRL SS (BLADE) ×2 IMPLANT
BNDG GAUZE ELAST 4 BULKY (GAUZE/BANDAGES/DRESSINGS) ×4 IMPLANT
CANISTER LIPO FAT HARVEST (MISCELLANEOUS) ×2 IMPLANT
CANISTER SUCT 1200ML W/VALVE (MISCELLANEOUS) ×2 IMPLANT
CHLORAPREP W/TINT 26 (MISCELLANEOUS) ×2 IMPLANT
COVER BACK TABLE 60X90IN (DRAPES) ×2 IMPLANT
COVER MAYO STAND STRL (DRAPES) ×4 IMPLANT
DERMABOND ADVANCED (GAUZE/BANDAGES/DRESSINGS) ×2
DERMABOND ADVANCED .7 DNX12 (GAUZE/BANDAGES/DRESSINGS) ×2 IMPLANT
DRAPE TOP ARMCOVERS (MISCELLANEOUS) ×2 IMPLANT
DRAPE U-SHAPE 76X120 STRL (DRAPES) ×2 IMPLANT
DRAPE UTILITY XL STRL (DRAPES) ×3 IMPLANT
DRSG PAD ABDOMINAL 8X10 ST (GAUZE/BANDAGES/DRESSINGS) ×6 IMPLANT
ELECT BLADE 4.0 EZ CLEAN MEGAD (MISCELLANEOUS) ×2
ELECT COATED BLADE 2.86 ST (ELECTRODE) ×2 IMPLANT
ELECT REM PT RETURN 9FT ADLT (ELECTROSURGICAL) ×2
ELECTRODE BLDE 4.0 EZ CLN MEGD (MISCELLANEOUS) ×1 IMPLANT
ELECTRODE REM PT RTRN 9FT ADLT (ELECTROSURGICAL) ×1 IMPLANT
GLOVE SURG HYDRASOFT LTX SZ5.5 (GLOVE) ×4 IMPLANT
GOWN STRL REUS W/ TWL LRG LVL3 (GOWN DISPOSABLE) ×2 IMPLANT
GOWN STRL REUS W/ TWL XL LVL3 (GOWN DISPOSABLE) IMPLANT
GOWN STRL REUS W/TWL LRG LVL3 (GOWN DISPOSABLE) ×2
GOWN STRL REUS W/TWL XL LVL3 (GOWN DISPOSABLE) ×2
IMPL BREAST GEL P6.4X FULL 580 (Breast) IMPLANT
IMPL BRST GEL P6.4X FULL 580CC (Breast) ×2 IMPLANT
IMPLANT BREAST GEL 580CC (Breast) ×4 IMPLANT
LINER CANISTER 1000CC FLEX (MISCELLANEOUS) ×2 IMPLANT
NDL SAFETY ECLIPSE 18X1.5 (NEEDLE) ×1 IMPLANT
NEEDLE HYPO 18GX1.5 SHARP (NEEDLE) ×2
NS IRRIG 1000ML POUR BTL (IV SOLUTION) ×1 IMPLANT
PACK BASIN DAY SURGERY FS (CUSTOM PROCEDURE TRAY) ×2 IMPLANT
PAD ALCOHOL SWAB (MISCELLANEOUS) ×2 IMPLANT
PENCIL SMOKE EVACUATOR (MISCELLANEOUS) ×2 IMPLANT
SHEET MEDIUM DRAPE 40X70 STRL (DRAPES) ×4 IMPLANT
SIZER BREAST 580CC SNGL USE (SIZER) ×2
SIZER BRST 580CC SNGL USE (SIZER) IMPLANT
SLEEVE SCD COMPRESS KNEE MED (STOCKING) ×2 IMPLANT
SPONGE T-LAP 18X18 ~~LOC~~+RFID (SPONGE) ×4 IMPLANT
STAPLER VISISTAT 35W (STAPLE) ×2 IMPLANT
SUT MNCRL AB 4-0 PS2 18 (SUTURE) ×4 IMPLANT
SUT VIC AB 3-0 SH 27 (SUTURE) ×4
SUT VIC AB 3-0 SH 27X BRD (SUTURE) ×2 IMPLANT
SUT VICRYL 4-0 PS2 18IN ABS (SUTURE) ×4 IMPLANT
SYR 10ML LL (SYRINGE) ×8 IMPLANT
SYR 50ML LL SCALE MARK (SYRINGE) ×5 IMPLANT
SYR BULB IRRIG 60ML STRL (SYRINGE) ×4 IMPLANT
SYR TB 1ML LL NO SAFETY (SYRINGE) ×2 IMPLANT
TOWEL GREEN STERILE FF (TOWEL DISPOSABLE) ×4 IMPLANT
TUBE CONNECTING 20X1/4 (TUBING) ×2 IMPLANT
TUBING INFILTRATION IT-10001 (TUBING) ×2 IMPLANT
TUBING SET GRADUATE ASPIR 12FT (MISCELLANEOUS) ×2 IMPLANT
UNDERPAD 30X36 HEAVY ABSORB (UNDERPADS AND DIAPERS) ×4 IMPLANT
YANKAUER SUCT BULB TIP NO VENT (SUCTIONS) ×2 IMPLANT

## 2021-07-23 NOTE — Op Note (Signed)
Operative Note   DATE OF OPERATION: 2.7.23  LOCATION: [facility]-outpatient  SURGICAL DIVISION: Plastic Surgery  PREOPERATIVE DIAGNOSES:  1. History breast cancer 2. Acquired absence of breasts  POSTOPERATIVE DIAGNOSES:  same  PROCEDURE:  1. Removal bilateral tissue expanders and placement silicone implants 2. Lipofilling from abdomen to bilateral chest 120 ml  SURGEON: Irene Limbo MD MBA  ASSISTANT: none  ANESTHESIA:  General.   EBL: 15 ml   COMPLICATIONS: None immediate.   INDICATIONS FOR PROCEDURE:  The patient, Katelyn Reyes, is a 61 y.o. female born on Feb 27, 1961, is here for staged breast reconstruction following bilateral skin reduction pattern mastectomies with immediate prepectoral tissue expander reconstruction.   FINDINGS: Natrelle Soft Touch Smooth Round Extra Projection 580 ml implants placed bilateral. REF [id number] RIGHT SN [id number] LEFT SN [id number]. 60 ml fat infiltrated over right chest, 60 ml fat infiltrated over left chest  DESCRIPTION OF PROCEDURE:  The patient's operative site was marked including supra and infra umbilical abdomen marked for liposuction. The patient was taken to the operating room. SCDs were placed and IV antibiotics were given. The patient's operative site was prepped and draped in a sterile fashion. A time out was performed and all information was confirmed to be correct. I began on left side. Incision made through prior inframammary fold scar and carried through superficial fascia and acellular dermis. Expander removed. Portion of lower pole ADM not adherent to chest wall and excised. Superior capsulotomy performed. Sizer placed.   I then directed attention to right chest. Incision made in prior inframammary fold scar and carried through superficial fascia and acellular dermis. Expander removed and well incorporated ADM noted. Sizer placed. Natrelle Smooth Round Extra Projection 580 ml implant selected for each chest. Patient  returned to supine position.   Stab incision made over bilateral abdomen. Tumescent fluid infiltrated over supra and infra umbilical abdomen, total 400 ml tumescent infiltrated. Power assisted liposuction performed to endpoint symmetric contour and soft tissue thickness, total lipoaspirate 300 ml. The fat was then washed and prepared by gravity for infiltration. Harvested fat was then infiltrated in subcutaneous plane throughout total envelope bilateral mastectomy flaps.    Each cavity irrigated with saline solution containing Betadine, Ancef, gentamicin solution. Hemostasis ensured.The implant was placed in right chest and implant orientation ensured. Closure completed with 3-0 vicryl to close superficial fascia and capsule over implant. 4-0 vicryl used to close dermis followed by 4-0 monocryl subcuticular. Implant placed in left chest cavity. Closure completed with 3-0 vicryl to close superficial fascia and capsule over implant. 4-0 vicryl used to close dermis followed by 4-0 monocryl subcuticular skin closure. Abdomen incisions approximated with simple 4-0 monocryl stitch. Dermabond applied to chest incisions. Dry dressing applied, followed by breast binder and abdominal binder.   The patient was allowed to wake from anesthesia, extubated and taken to the recovery room in satisfactory condition.   SPECIMENS: none  DRAINS: none

## 2021-07-23 NOTE — Anesthesia Preprocedure Evaluation (Addendum)
Anesthesia Evaluation  Patient identified by MRN, date of birth, ID band Patient awake    Reviewed: Allergy & Precautions, NPO status , Patient's Chart, lab work & pertinent test results  Airway Mallampati: II  TM Distance: >3 FB Neck ROM: Full    Dental  (+) Teeth Intact, Dental Advisory Given   Pulmonary asthma (well controlled, has been >35mo since last inhaler) ,    Pulmonary exam normal breath sounds clear to auscultation       Cardiovascular negative cardio ROS Normal cardiovascular exam Rhythm:Regular Rate:Normal     Neuro/Psych PSYCHIATRIC DISORDERS Anxiety Depression    GI/Hepatic Neg liver ROS, GERD  Medicated and Controlled,  Endo/Other  Morbid obesityBMI 40  Renal/GU negative Renal ROS  negative genitourinary   Musculoskeletal  (+) Arthritis , Osteoarthritis,    Abdominal (+) + obese,   Peds negative pediatric ROS (+)  Hematology negative hematology ROS (+) hct 37.2   Anesthesia Other Findings   Reproductive/Obstetrics negative OB ROS                            Anesthesia Physical Anesthesia Plan  ASA: 3  Anesthesia Plan: General   Post-op Pain Management: Tylenol PO (pre-op) and Toradol IV (intra-op)   Induction: Intravenous  PONV Risk Score and Plan: Ondansetron, Dexamethasone, Midazolam and Treatment may vary due to age or medical condition  Airway Management Planned: Oral ETT  Additional Equipment: None  Intra-op Plan:   Post-operative Plan: Extubation in OR  Informed Consent: I have reviewed the patients History and Physical, chart, labs and discussed the procedure including the risks, benefits and alternatives for the proposed anesthesia with the patient or authorized representative who has indicated his/her understanding and acceptance.     Dental advisory given  Plan Discussed with: CRNA  Anesthesia Plan Comments:        Anesthesia Quick  Evaluation

## 2021-07-23 NOTE — Discharge Instructions (Signed)
°  Post Anesthesia Home Care Instructions  Activity: Get plenty of rest for the remainder of the day. A responsible individual must stay with you for 24 hours following the procedure.  For the next 24 hours, DO NOT: -Drive a car -Paediatric nurse -Drink alcoholic beverages -Take any medication unless instructed by your physician -Make any legal decisions or sign important papers.  Meals: Start with liquid foods such as gelatin or soup. Progress to regular foods as tolerated. Avoid greasy, spicy, heavy foods. If nausea and/or vomiting occur, drink only clear liquids until the nausea and/or vomiting subsides. Call your physician if vomiting continues.  Special Instructions/Symptoms: Your throat may feel dry or sore from the anesthesia or the breathing tube placed in your throat during surgery. If this causes discomfort, gargle with warm salt water. The discomfort should disappear within 24 hours.  If you had a scopolamine patch placed behind your ear for the management of post- operative nausea and/or vomiting:  1. The medication in the patch is effective for 72 hours, after which it should be removed.  Wrap patch in a tissue and discard in the trash. Wash hands thoroughly with soap and water. 2. You may remove the patch earlier than 72 hours if you experience unpleasant side effects which may include dry mouth, dizziness or visual disturbances. 3. Avoid touching the patch. Wash your hands with soap and water after contact with the patch.      No tylenol until after 12:00 today if needed.

## 2021-07-23 NOTE — Transfer of Care (Signed)
Immediate Anesthesia Transfer of Care Note  Patient: Katelyn Reyes  Procedure(s) Performed: REMOVAL OF BILATERAL TISSUE EXPANDERS WITH PLACEMENT OF BILATERAL BREAST SILICONE IMPLANTS (Bilateral: Chest) LIPOFILLING FROM ABDOMEN TO BILATERAL CHEST (Bilateral: Chest)  Patient Location: PACU  Anesthesia Type:General  Level of Consciousness: awake, alert , oriented, drowsy and patient cooperative  Airway & Oxygen Therapy: Patient Spontanous Breathing and Patient connected to face mask oxygen  Post-op Assessment: Report given to RN and Post -op Vital signs reviewed and stable  Post vital signs: Reviewed and stable  Last Vitals:  Vitals Value Taken Time  BP 118/86 07/23/21 0945  Temp    Pulse 91 07/23/21 0945  Resp 12 07/23/21 0945  SpO2 97 % 07/23/21 0945  Vitals shown include unvalidated device data.  Last Pain:  Vitals:   07/23/21 0625  TempSrc: Oral  PainSc: 0-No pain         Complications: No notable events documented.

## 2021-07-23 NOTE — Anesthesia Procedure Notes (Signed)

## 2021-07-23 NOTE — H&P (Addendum)
Subjective:     Patient ID: Katelyn Reyes is a 61 y.o. female.   HPI    3 months post op. Here for implant exchange.   Presented following screening MMG showing right breast asymmetry. Diagnostic MMG/US showed 0.4 cm mass at 12 o'clock right breast 10 cmfn, axilla normal. Biopsy showed IDC with DCIS, ER/PR+, Her2-. Additional biopsy of right breast upper calcifications benign.   MA with breast ca. Genetics negative.    Patient has history multiple benign breast biopsies. Patient elected for bilateral mastectomies. Final pathology no residual carcinoma, 0/1 SLN   Oncotype ordered on biopsy specimen- unable to complete, specimen not available.   Prior 39 C happy with this. 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 Lives alone. Family in area to assist with post op care. Also in school for FNP.   Review of Systems      Objective:   Physical Exam Cardiovascular:     Rate and Rhythm: Normal rate and regular rhythm.     Heart sounds: Normal heart sounds.  Pulmonary:     Effort: Pulmonary effort is normal.     Breath sounds: Normal breath sounds.       Chest: Multiple raised hyperpigmented scars from prior biopsies   Bilateral chest expanded, left T junction wound healed   Abd soft no hernias    Assessment:     Right breast cancer S/p bilateral SRM, right SLN, bilateral prepectoral TE/ADM (Alloderm) reconstruction    Plan:     Plan removal bilateral TE and placement silicone implants, lipofilling to bilateral chest.   Reviewed saline vs silicone, shaped vs round, textured vs smooth. Recommend HCG or capacity filled silicone implants as they may offer reduced risk visible rippling. Reviewed MRI or [place] surveillance for rupture with silicone implants. Reviewed risks BIA ALCL with textured devices, new reports SCC implant associated with breast implants. Reviewed shared risks rupture contracture need for additional surgery. Counseled  implants are not permanent devices. Reviewed size in part guided by width chest, cannot assure her cup size. Patient has elected for silicone plan smooth round.     Reviewed possible fat grafting. Reviewed purpose of this to thicken flaps limit visible rippling and aid in contour. Reviewed donor site pain, need for compression, variable take graft, fat necrosis that presents as masses and may require work up. Desires to proceed plan lower abdomen donor site. She has Spanx type garment for post op use.   Additional risks including but not limited to bleeding seroma hematoma infection need for additional procedures blood clots in legs or lungs damage to adjacent structures asymmetry unaccepatable cosmetic result reviewed.   Completed Herma Carson physician patient checklist.   Rx for Bactrim given. Patient reports has pain medication and muscle relaxant at home.   Natrelle 133S FV-13-T 500 ml tissue expanders placed bilateral,  fill volume 500 ml saline bilateral.

## 2021-07-23 NOTE — Anesthesia Postprocedure Evaluation (Signed)
Anesthesia Post Note  Patient: Katelyn Reyes  Procedure(s) Performed: REMOVAL OF BILATERAL TISSUE EXPANDERS WITH PLACEMENT OF BILATERAL BREAST SILICONE IMPLANTS (Bilateral: Chest) LIPOFILLING FROM ABDOMEN TO BILATERAL CHEST (Bilateral: Chest)     Patient location during evaluation: PACU Anesthesia Type: General Level of consciousness: awake Pain management: pain level controlled Vital Signs Assessment: post-procedure vital signs reviewed and stable Respiratory status: spontaneous breathing and respiratory function stable Cardiovascular status: stable Postop Assessment: no apparent nausea or vomiting Anesthetic complications: no   No notable events documented.  Last Vitals:  Vitals:   07/23/21 1030 07/23/21 1100  BP: 109/65 120/60  Pulse: 81 83  Resp: 18 16  Temp:  37.1 C  SpO2: 94% 94%    Last Pain:  Vitals:   07/23/21 1100  TempSrc:   PainSc: 0-No pain                 Merlinda Frederick

## 2021-07-24 ENCOUNTER — Encounter (HOSPITAL_BASED_OUTPATIENT_CLINIC_OR_DEPARTMENT_OTHER): Payer: Self-pay | Admitting: Plastic Surgery

## 2021-07-24 LAB — QUANTIFERON-TB GOLD PLUS
Mitogen-NIL: >10 IU/mL
NIL: 0.13 IU/mL
QuantiFERON-TB Gold Plus: NEGATIVE
TB1-NIL: 0.03 IU/mL
TB2-NIL: 0.02 IU/mL

## 2021-07-29 ENCOUNTER — Other Ambulatory Visit (HOSPITAL_COMMUNITY): Payer: Self-pay

## 2021-07-30 DIAGNOSIS — C50911 Malignant neoplasm of unspecified site of right female breast: Secondary | ICD-10-CM | POA: Diagnosis not present

## 2021-07-30 DIAGNOSIS — Z9012 Acquired absence of left breast and nipple: Secondary | ICD-10-CM | POA: Diagnosis not present

## 2021-08-06 ENCOUNTER — Ambulatory Visit (INDEPENDENT_AMBULATORY_CARE_PROVIDER_SITE_OTHER): Payer: 59

## 2021-08-06 ENCOUNTER — Other Ambulatory Visit: Payer: Self-pay

## 2021-08-06 DIAGNOSIS — M25511 Pain in right shoulder: Secondary | ICD-10-CM | POA: Diagnosis not present

## 2021-08-06 DIAGNOSIS — G8929 Other chronic pain: Secondary | ICD-10-CM

## 2021-08-07 NOTE — Progress Notes (Signed)
MRI of the shoulder shows a complete tear of the supraspinatus tendon with significant retraction.  This is one of the rotator cuff tendons. Additionally there is significant tendinitis of the other rotator cuff tendon the infraspinatus tendon with a small tear. There is also some tendinitis of the biceps tendon. I do not think this is going to get better and likely will require surgery.    Would you like me to refer you directly to surgery?    Otherwise check back with me in clinic to go over the results of full detail.

## 2021-08-09 ENCOUNTER — Inpatient Hospital Stay: Payer: 59 | Admitting: Hematology & Oncology

## 2021-08-09 ENCOUNTER — Inpatient Hospital Stay: Payer: 59 | Attending: Internal Medicine

## 2021-08-09 ENCOUNTER — Encounter: Payer: Self-pay | Admitting: *Deleted

## 2021-08-09 ENCOUNTER — Other Ambulatory Visit: Payer: Self-pay

## 2021-08-09 ENCOUNTER — Ambulatory Visit: Payer: 59 | Admitting: Family Medicine

## 2021-08-09 ENCOUNTER — Encounter: Payer: Self-pay | Admitting: Hematology & Oncology

## 2021-08-09 VITALS — BP 105/71 | HR 71 | Temp 98.2°F | Resp 18 | Ht 62.0 in | Wt 175.1 lb

## 2021-08-09 VITALS — BP 105/71 | HR 71 | Ht 62.0 in | Wt 175.0 lb

## 2021-08-09 DIAGNOSIS — C50911 Malignant neoplasm of unspecified site of right female breast: Secondary | ICD-10-CM | POA: Diagnosis not present

## 2021-08-09 DIAGNOSIS — M25511 Pain in right shoulder: Secondary | ICD-10-CM

## 2021-08-09 DIAGNOSIS — R197 Diarrhea, unspecified: Secondary | ICD-10-CM | POA: Diagnosis present

## 2021-08-09 DIAGNOSIS — Z170 Estrogen receptor positive status [ER+]: Secondary | ICD-10-CM | POA: Diagnosis not present

## 2021-08-09 DIAGNOSIS — Z79899 Other long term (current) drug therapy: Secondary | ICD-10-CM | POA: Diagnosis not present

## 2021-08-09 DIAGNOSIS — Z79811 Long term (current) use of aromatase inhibitors: Secondary | ICD-10-CM | POA: Insufficient documentation

## 2021-08-09 DIAGNOSIS — M75121 Complete rotator cuff tear or rupture of right shoulder, not specified as traumatic: Secondary | ICD-10-CM | POA: Diagnosis not present

## 2021-08-09 DIAGNOSIS — C50411 Malignant neoplasm of upper-outer quadrant of right female breast: Secondary | ICD-10-CM

## 2021-08-09 DIAGNOSIS — G8929 Other chronic pain: Secondary | ICD-10-CM | POA: Diagnosis not present

## 2021-08-09 DIAGNOSIS — Z9013 Acquired absence of bilateral breasts and nipples: Secondary | ICD-10-CM | POA: Insufficient documentation

## 2021-08-09 DIAGNOSIS — R112 Nausea with vomiting, unspecified: Secondary | ICD-10-CM | POA: Diagnosis present

## 2021-08-09 LAB — CBC WITH DIFFERENTIAL (CANCER CENTER ONLY)
Abs Immature Granulocytes: 0.01 10*3/uL (ref 0.00–0.07)
Basophils Absolute: 0 10*3/uL (ref 0.0–0.1)
Basophils Relative: 1 %
Eosinophils Absolute: 0.2 10*3/uL (ref 0.0–0.5)
Eosinophils Relative: 3 %
HCT: 37.5 % (ref 36.0–46.0)
Hemoglobin: 12.3 g/dL (ref 12.0–15.0)
Immature Granulocytes: 0 %
Lymphocytes Relative: 53 %
Lymphs Abs: 3.2 10*3/uL (ref 0.7–4.0)
MCH: 31.1 pg (ref 26.0–34.0)
MCHC: 32.8 g/dL (ref 30.0–36.0)
MCV: 94.7 fL (ref 80.0–100.0)
Monocytes Absolute: 0.4 10*3/uL (ref 0.1–1.0)
Monocytes Relative: 7 %
Neutro Abs: 2.2 10*3/uL (ref 1.7–7.7)
Neutrophils Relative %: 36 %
Platelet Count: 330 10*3/uL (ref 150–400)
RBC: 3.96 MIL/uL (ref 3.87–5.11)
RDW: 11.9 % (ref 11.5–15.5)
WBC Count: 6.1 10*3/uL (ref 4.0–10.5)
nRBC: 0 % (ref 0.0–0.2)

## 2021-08-09 LAB — CMP (CANCER CENTER ONLY)
ALT: 18 U/L (ref 0–44)
AST: 17 U/L (ref 15–41)
Albumin: 3.8 g/dL (ref 3.5–5.0)
Alkaline Phosphatase: 74 U/L (ref 38–126)
Anion gap: 6 (ref 5–15)
BUN: 12 mg/dL (ref 6–20)
CO2: 30 mmol/L (ref 22–32)
Calcium: 9.5 mg/dL (ref 8.9–10.3)
Chloride: 108 mmol/L (ref 98–111)
Creatinine: 1.01 mg/dL — ABNORMAL HIGH (ref 0.44–1.00)
GFR, Estimated: >60 mL/min (ref >60)
Glucose, Bld: 91 mg/dL (ref 70–99)
Potassium: 4.5 mmol/L (ref 3.5–5.1)
Sodium: 144 mmol/L (ref 135–145)
Total Bilirubin: 0.4 mg/dL (ref 0.3–1.2)
Total Protein: 7.1 g/dL (ref 6.5–8.1)

## 2021-08-09 LAB — LACTATE DEHYDROGENASE: LDH: 157 U/L (ref 98–192)

## 2021-08-09 NOTE — Progress Notes (Signed)
I, Katelyn Reyes, LAT, ATC acting as a scribe for Katelyn Leader, MD.  Katelyn Reyes is a 61 y.o. female who presents to [place] at [facility] today for f/u R shoulder pain and MRI review. Pt was last seen by Dr. Georgina Reyes on 06/26/21 and was advised to proceed to MRI. MRI was delayed due to pt having breast tissue expanders and reconstruction surgery that was performed on 07/23/21. Today, pt reports her shoulder is still really bothering her and is very painful.  She is a Equities trader working in the ICU.  She has been on leave and plans to return to work on March 8.  She is also in the final stages of finishing her FNP. She does state her supervisor at work does think there is a job for her to do with light duty if she has a work note.  Dx imaging: 08/06/21 R shoulder MRI  06/26/21 R shoulder  Pertinent review of systems: No fevers or chills  Relevant historical information: Katelyn Reyes.  History of breast cancer.     Exam:  BP 105/71    Pulse 71    Ht 5\' 2"  (1.575 m)    Wt 175 lb (79.4 kg)    LMP  (LMP Unknown)    SpO2 99%    BMI 32.01 kg/m  General: Well Developed, well nourished, and in no acute distress.   MSK: Right shoulder decreased range of motion pain with abduction.  Decreased strength to abduction.    Lab and Radiology Results  MR SHOULDER RIGHT WO CONTRAST  Result Date: 08/07/2021 CLINICAL DATA:  Chronic right shoulder pain and weakness with crepitus for many years. Clinical concern for rotator cuff disorder. EXAM: MRI OF THE RIGHT SHOULDER WITHOUT CONTRAST TECHNIQUE: Multiplanar, multisequence MR imaging of the shoulder was performed. No intravenous contrast was administered. COMPARISON:  X-ray 06/26/2021. FINDINGS: Rotator cuff: Complete tear of the supraspinatus tendon with 4 cm of retraction. Severe tendinosis of the infraspinatus tendon with an interstitial tear. Teres minor tendon is intact. Subscapularis tendon is intact. Muscles: No muscle atrophy or  edema. No intramuscular fluid collection or hematoma. Biceps Long Head: Mild tendinosis of the intra-articular portion of the long head of the biceps tendon. Acromioclavicular Joint: Moderate arthropathy of the acromioclavicular joint. Small amount of subacromial/subdeltoid bursal fluid communicating with the joint space through the full-thickness supraspinatus tear. Glenohumeral Joint: Moderate joint effusion. Partial-thickness cartilage loss of the glenohumeral joint. Labrum: Irregularity of the superior labrum likely reflecting degeneration with a posterosuperior labral tear. Bones: No fracture or dislocation. No aggressive osseous lesion. Other: No fluid collection or hematoma. IMPRESSION: 1. Complete tear of the supraspinatus tendon with 4 cm of retraction. 2. Severe tendinosis of the infraspinatus tendon with an interstitial tear. 3. Mild tendinosis of the intra-articular portion of the long head of the biceps tendon. Electronically Signed   By: Katelyn Reyes M.D.   On: 08/07/2021 09:34    I, Katelyn Reyes, personally (independently) visualized and performed the interpretation of the images attached in this note.    Assessment and Plan: 61 y.o. female with right shoulder pain due to full-thickness rotator cuff tear of the supraspinatus tendon with retraction.  Fortunately she has not yet had atrophy.  She also has some tendinitis of the infraspinatus tendon and the biceps tendon that is contributing to her pain.  I am not optimistic about her doing well with conservative management and think that surgical consultation is her best option at this point especially  before she starts experiencing muscle atrophy.  She already scheduled herself an appointment with Dr. Onnie Reyes for March 21.  I do not think it is a good idea for her to wait that long.  I recommended Dr. Sammuel Reyes who I think will be able to get her in very soon and will do a great job.  She agrees and will schedule with him shortly.  Dr. Onnie Reyes is a  great backup plan.     PDMP not reviewed this encounter. Orders Placed This Encounter  Procedures   Ambulatory referral to Orthopedic Surgery    Referral Priority:   Routine    Referral Type:   Surgical    Referral Reason:   Specialty Services Required    Requested Specialty:   Orthopedic Surgery    Number of Visits Requested:   1   No orders of the defined types were placed in this encounter.   Total encounter time 20 minutes including face-to-face time with the patient and, reviewing past medical record, and charting on the date of service.   Reviewed MRI findings discussed treatment plan and options. Discussed warning signs or symptoms. Please see discharge instructions. Patient expresses understanding.   The above documentation has been reviewed and is accurate and complete Katelyn Reyes, M.D.

## 2021-08-09 NOTE — Progress Notes (Signed)
Oncology Nurse Navigator Documentation  Oncology Nurse Navigator Flowsheets 08/09/2021  Abnormal Finding Date -  Confirmed Diagnosis Date -  Diagnosis Status -  Planned Course of Treatment -  Phase of Treatment -  Aromatase Inhibitor Actual Start Date: -  Expected Surgery Date -  Surgery Actual Start Date: -  Navigator Follow Up Date: 10/11/2021  Navigator Follow Up Reason: Follow-up Appointment  Navigator Location [facility]  Referral Date to RadOnc/MedOnc -  Navigator Encounter Type Follow-up Appt;Appt/Treatment Plan Review  Telephone -  Treatment Initiated Date -  Patient Visit Type MedOnc  Treatment Phase Active Tx  Barriers/Navigation Needs No Barriers At This Time  Education -  Interventions -  Acuity Level 1-No Barriers  Coordination of Care -  Education Method -  Support Groups/Services Friends and Family  Time Spent with Patient 15

## 2021-08-09 NOTE — Patient Instructions (Addendum)
Thank you for coming in today.   I've referred you to see Dr. Sammuel Hines at [facility].  Their office will call you to schedule but please let us know if you haven't heard from them in one week regarding scheduling.  Follow-up as needed.

## 2021-08-09 NOTE — Progress Notes (Signed)
Hematology and Oncology Follow Up Visit  Katelyn Reyes [id number] [birth year] 61 y.o. 08/09/2021   Principle Diagnosis:  Stage IA ([id number]) infiltrating ductal carcinoma of the right breast- ER+/PR+/HER2-  Current Therapy:   Status post bilateral mastectomies --04/26/2021 Arimidex 1 mg p.o. daily --start on 06/29/2021     Interim History:  Katelyn Reyes is back for follow-up.  The problem that she now has that she has a torn rotator cuff in the right shoulder.  She will need surgery for this.  She had an MRI that showed a complete tear in the supraspinatus tendon.  She has severe is of the infraspinatus tendon.  She sees Dr. Justice Britain of Orthopedic Surgery.  She and he will then decide when surgery will take place.  She did have reconstructive surgery of the right breast.  This went well.  This was just 3 weeks ago.  Unfortunately, pathology seem to have lost her specimen with breast cancer so we cannot send off the Oncotype score.  However, I do not think that this is going to be all that significant since I would suspect that her Oncotype score will be low.  She is doing well on the Arimidex.  She is not having a lot of bony pain.  She is having no problems with hot flashes.  She did have a bone density test on 07/16/2021.  This showed no osteopenia.  She has had no cough or shortness of breath.  There is no nausea or vomiting.  She has had no rashes.  There has been no bleeding.  There has been no arm or leg swelling.  I did see a picture of her new Manufacturing engineer.  She is very cute.  Overall, I would say that her performance status is probably ECOG 1.   Medications:  Current Outpatient Medications:    albuterol (VENTOLIN HFA) 108 (90 Base) MCG/ACT inhaler, INHALE 2 PUFFS INTO THE LUNGS EVERY 6 HOURS AS NEEDED FOR WHEEZING., Disp: 18 g, Rfl: 6   ALPRAZolam (XANAX) 0.5 MG tablet, Take 1 tablet (0.5 mg total) by mouth 3 (three) times daily as needed., Disp: 60 tablet,  Rfl: 3   anastrozole (ARIMIDEX) 1 MG tablet, Take 1 tablet (1 mg total) by mouth daily., Disp: 30 tablet, Rfl: 12   Cholecalciferol (VITAMIN D-3) 125 MCG (5000 UT) TABS, Take 5,000 Units by mouth every evening., Disp: , Rfl:    montelukast (SINGULAIR) 10 MG tablet, TAKE 1 TABLET (10 MG TOTAL) BY MOUTH DAILY., Disp: 90 tablet, Rfl: 1   pantoprazole (PROTONIX) 40 MG tablet, Take 1 tablet (40 mg total) by mouth 2 (two) times daily., Disp: 180 tablet, Rfl: 1   Semaglutide, 1 MG/DOSE, (OZEMPIC, 1 MG/DOSE,) 4 MG/3ML SOPN, Inject 1 mg as directed once a week., Disp: 3 mL, Rfl: 2   sertraline (ZOLOFT) 100 MG tablet, Take 1 tablet (100 mg total) by mouth daily., Disp: 90 tablet, Rfl: 1   zolpidem (AMBIEN) 10 MG tablet, Take 1 tablet (10 mg total) by mouth at bedtime as needed for sleep., Disp: 30 tablet, Rfl: 3   cetirizine (ZYRTEC) 10 MG tablet, TAKE 1 TABLET (10 MG TOTAL) BY MOUTH DAILY., Disp: 90 tablet, Rfl: 1   fluticasone (FLONASE) 50 MCG/ACT nasal spray, PLACE 2 SPRAYS INTO BOTH NOSTRILS DAILY., Disp: 16 g, Rfl: 6  Allergies:  Allergies  Allergen Reactions   Amoxicillin Rash and Other (See Comments)    Has patient had a PCN reaction causing immediate rash, facial/tongue/throat swelling, SOB  or lightheadedness with hypotension: No Has patient had a PCN reaction causing severe rash involving mucus membranes or skin necrosis: No Has patient had a PCN reaction that required treatment: #  #  #  YES  #  #  # - MD office Has patient had a PCN reaction occurring within the last 10 years: Yes If all of the above answers are "NO", then may proceed with Cephalosporin use.    Erythromycin Nausea Only   Hydrocodone-Acetaminophen Other (See Comments)    Hallucinations   Penicillin G Rash   Shellfish Allergy Itching and Other (See Comments)    REACTS TO SCALLOPS     Past Medical History, Surgical history, Social history, and Family History were reviewed and updated.  Review of Systems: Review of  Systems  Constitutional:  Positive for appetite change.  HENT:  Negative.    Eyes: Negative.   Respiratory: Negative.    Cardiovascular: Negative.   Gastrointestinal:  Positive for diarrhea and vomiting.  Endocrine: Negative.   Genitourinary: Negative.    Musculoskeletal: Negative.   Skin: Negative.   Neurological: Negative.   Hematological: Negative.   Psychiatric/Behavioral: Negative.     Physical Exam:  height is 5' 2"  (1.575 m) and weight is 175 lb 1.9 oz (79.4 kg). Her oral temperature is 98.2 F (36.8 C). Her blood pressure is 105/71 and her pulse is 71. Her respiration is 18 and oxygen saturation is 99%.   Wt Readings from Last 3 Encounters:  08/09/21 175 lb 1.9 oz (79.4 kg)  07/23/21 220 lb 0.3 oz (99.8 kg)  07/19/21 179 lb (81.2 kg)    Physical Exam Vitals reviewed.  Constitutional:      Comments: Breast exam shows status post mastectomies.  She has implants.  Everything looks to be healing nicely.  There is no erythema.  There is no exudate.  The wounds are intact.  There is no bilateral axillary adenopathy.    HENT:     Head: Normocephalic and atraumatic.  Eyes:     Pupils: Pupils are equal, round, and reactive to light.  Cardiovascular:     Rate and Rhythm: Normal rate and regular rhythm.     Heart sounds: Normal heart sounds.  Pulmonary:     Effort: Pulmonary effort is normal.     Breath sounds: Normal breath sounds.  Abdominal:     General: Bowel sounds are normal.     Palpations: Abdomen is soft.     Comments: Abdominal exam is soft.  There is no distention.  There is some tenderness to palpation.  She has no fluid wave.  Bowel sounds are present.  There is no palpable liver or spleen tip.  Musculoskeletal:        General: No tenderness or deformity. Normal range of motion.     Cervical back: Normal range of motion.  Lymphadenopathy:     Cervical: No cervical adenopathy.  Skin:    General: Skin is warm and dry.     Findings: No erythema or rash.   Neurological:     Mental Status: She is alert and oriented to person, place, and time.  Psychiatric:        Behavior: Behavior normal.        Thought Content: Thought content normal.        Judgment: Judgment normal.     Lab Results  Component Value Date   WBC 6.1 08/09/2021   HGB 12.3 08/09/2021   HCT 37.5 08/09/2021   MCV 94.7  08/09/2021   PLT 330 08/09/2021     Chemistry      Component Value Date/Time   NA 141 07/19/2021 0835   K 3.5 07/19/2021 0835   CL 103 07/19/2021 0835   CO2 34 (H) 07/19/2021 0835   BUN 12 07/19/2021 0835   CREATININE 0.93 07/19/2021 0835   CREATININE 0.97 06/28/2021 0800   CREATININE 0.92 04/28/2017 1645      Component Value Date/Time   CALCIUM 9.2 07/19/2021 0835   ALKPHOS 66 07/19/2021 0835   AST 19 07/19/2021 0835   AST 23 06/28/2021 0800   ALT 21 07/19/2021 0835   ALT 24 06/28/2021 0800   BILITOT 0.4 07/19/2021 0835   BILITOT 0.5 06/28/2021 0800      Impression and Plan: Ms. Normoyle is a very charming 61 year old African-American female.  She is postmenopausal.  She has a very early stage ductal carcinoma of the right breast.  Again, I do suspect that with surgery alone, her chance of cure is probably 90%.  We will continue the Arimidex.  I think we now get her back in 2-3 months.  I am sure that we will see more pictures of her puppy.  She may need to have her rotator cuff surgery between now and when we see her back.  Again, if she does,  I do not see a problem with her having this.   Volanda Napoleon, MD 2/24/20239:03 AM

## 2021-08-13 ENCOUNTER — Other Ambulatory Visit (HOSPITAL_BASED_OUTPATIENT_CLINIC_OR_DEPARTMENT_OTHER): Payer: Self-pay

## 2021-08-13 ENCOUNTER — Other Ambulatory Visit: Payer: Self-pay

## 2021-08-13 ENCOUNTER — Ambulatory Visit (HOSPITAL_BASED_OUTPATIENT_CLINIC_OR_DEPARTMENT_OTHER): Payer: 59 | Admitting: Orthopaedic Surgery

## 2021-08-13 ENCOUNTER — Ambulatory Visit (HOSPITAL_BASED_OUTPATIENT_CLINIC_OR_DEPARTMENT_OTHER): Payer: Self-pay | Admitting: Orthopaedic Surgery

## 2021-08-13 DIAGNOSIS — G8929 Other chronic pain: Secondary | ICD-10-CM | POA: Diagnosis not present

## 2021-08-13 DIAGNOSIS — M75121 Complete rotator cuff tear or rupture of right shoulder, not specified as traumatic: Secondary | ICD-10-CM

## 2021-08-13 DIAGNOSIS — M25511 Pain in right shoulder: Secondary | ICD-10-CM

## 2021-08-13 MED ORDER — OXYCODONE HCL 5 MG PO TABS
5.0000 mg | ORAL_TABLET | ORAL | 0 refills | Status: DC | PRN
  Filled 2021-08-13: qty 20, 4d supply, fill #0

## 2021-08-13 MED ORDER — ACETAMINOPHEN 500 MG PO TABS
500.0000 mg | ORAL_TABLET | Freq: Three times a day (TID) | ORAL | 0 refills | Status: DC
  Filled 2021-08-13: qty 30, 10d supply, fill #0

## 2021-08-13 MED ORDER — ASPIRIN EC 325 MG PO TBEC
325.0000 mg | DELAYED_RELEASE_TABLET | Freq: Every day | ORAL | 0 refills | Status: DC
  Filled 2021-08-13: qty 30, 30d supply, fill #0

## 2021-08-13 MED ORDER — IBUPROFEN 800 MG PO TABS
800.0000 mg | ORAL_TABLET | Freq: Three times a day (TID) | ORAL | 0 refills | Status: DC
  Filled 2021-08-13: qty 30, 10d supply, fill #0

## 2021-08-13 NOTE — Progress Notes (Signed)
Chief Complaint: Right shoulder pain     History of Present Illness:    Katelyn Reyes is a 61 y.o. female right-hand-dominant presents with right shoulder pain has been ongoing since December 2022.  She did not have a specific injury but noted increased weakness and pain since that time.  She was working on lymphedema treatments as part of her mastectomy for breast cancer.  She is noted during the rehab that she has had significant weakness and pain on the right side.  This is significantly painful with overhead activity.  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  She has been in physical therapy since early January although this has unfortunately reached a plateau as she is no longer able to regain her strength and is experiencing significant pain with overhead.  She is not able to lay directly on the side.  She is concerned about her ability to perform her job as she works as an Warden/ranger with a requirement for significant lifting.  She does have a new dog at home.  She enjoys traveling to [place] in her spare time.    Surgical History:   No orthopedic related surgery  PMH/PSH/Family History/Social History/Meds/Allergies:    Past Medical History:  Diagnosis Date   Allergic rhinitis    Anxiety    Arthritis    Asthma    related to seasonal allergies   Breast calcification, right 02/02/2013   Surgically excised 02/17/13 > path benign    Cancer ([place])    Depression    Family history of breast cancer 04/12/2021   Family history of ovarian cancer 04/12/2021   GERD (gastroesophageal reflux disease)    Hyperlipidemia    IUD 02/07/2010   Past Surgical History:  Procedure Laterality Date   BREAST BIOPSY Right 02/17/2013   Procedure:  NEEDLE LOCALIZATION REMOVAL RIGHT BREAST CALCIFICATIONS;  Surgeon: Haywood Lasso, MD;  Location: [facility];  Service:  General;  Laterality: Right;   BREAST EXCISIONAL BIOPSY Right pt unsure   benign   BREAST RECONSTRUCTION WITH PLACEMENT OF TISSUE EXPANDER AND ALLODERM Bilateral 04/26/2021   Procedure: BREAST RECONSTRUCTION WITH PLACEMENT OF TISSUE EXPANDER AND ALLODERM;  Surgeon: Irene Limbo, MD;  Location: [facility];  Service: Plastics;  Laterality: Bilateral;   BURCH PROCEDURE N/A 12/22/2013   Procedure: BURCH CYSTO URETHROPEXY, ANTERIOR AND POSTERIOR CULPORRHAPHY;  Surgeon: Ena Dawley, MD;  Location: [facility];  Service: Gynecology;  Laterality: N/A;   CERVICAL DISC ARTHROPLASTY N/A 11/12/2017   Procedure: ARTIFICIAL DISC REPLACEMENT CERVICAL SIX-SEVEN;  Surgeon: Ashok Pall, MD;  Location: [facility];  Service: Neurosurgery;  Laterality: N/A;  ARTIFICIAL DISC REPLACEMENT CERVICAL 6- CERVICAL 7   CHOLECYSTECTOMY N/A 10/09/2017   Procedure: LAPAROSCOPIC CHOLECYSTECTOMY WITH INTRAOPERATIVE CHOLANGIOGRAM;  Surgeon: Judeth Horn, MD;  Location: [facility];  Service: General;  Laterality: N/A;   KNEE ARTHROSCOPY WITH MEDIAL MENISECTOMY Right 05/01/2020   Procedure: KNEE ARTHROSCOPY WITH PARTIAL MEDIAL MENISECTOMY,   PATELLA  CHONDROPLASTY;  Surgeon: Paralee Cancel, MD;  Location[facility];  Service: Orthopedics;  Laterality: Right;   LIPOSUCTION WITH LIPOFILLING Bilateral 07/23/2021   Procedure: LIPOFILLING FROM ABDOMEN TO BILATERAL CHEST;  Surgeon: Irene Limbo, MD;  Location: [facility];  Service: Plastics;  Laterality: Bilateral;   MASTECTOMY W/ SENTINEL NODE BIOPSY Right 04/26/2021   Procedure: RIGHT MASTECTOMY WITH RIGHT SENTINEL LYMPH NODE BIOPSY;  Surgeon: [name], MD;  Location: [facility];  Service: General;  Laterality: Right;   MOUTH SURGERY     NASAL SINUS SURGERY     REMOVAL OF BILATERAL TISSUE EXPANDERS WITH PLACEMENT OF BILATERAL BREAST IMPLANTS Bilateral 07/23/2021   Procedure: REMOVAL OF BILATERAL TISSUE EXPANDERS WITH  PLACEMENT OF BILATERAL BREAST SILICONE IMPLANTS;  Surgeon: Irene Limbo, MD;  Location: [facility];  Service: Plastics;  Laterality: Bilateral;   TOTAL MASTECTOMY Left 04/26/2021   Procedure: LEFT TOTAL MASTECTOMY;  Surgeon: [name], MD;  Location: [facility];  Service: General;  Laterality: Left;   Social History   Socioeconomic History   Marital status: Single    Spouse name: Not on file   Number of children: Not on file   Years of education: Not on file   Highest education level: Not on file  Occupational History   Not on file  Tobacco Use   Smoking status: Never   Smokeless tobacco: Never  Vaping Use   Vaping Use: Never used  Substance and Sexual Activity   Alcohol use: Yes    Comment: social   Drug use: No   Sexual activity: Yes    Birth control/protection: I.U.D.  Other Topics Concern   Not on file  Social History Narrative   Not on file   Social Determinants of Health   Financial Resource Strain: Not on file  Food Insecurity: Not on file  Transportation Needs: Not on file  Physical Activity: Not on file  Stress: Not on file  Social Connections: Not on file   Family History  Problem Relation Age of Onset   Fibrocystic breast disease Mother    Hypertension Father    Diabetes Father    Breast cancer Maternal Aunt        dx 42s   Colon cancer Paternal Aunt        x2 pat aunts, dx after 15   Stroke Maternal Grandmother    Cancer Other        ovarian/GYN; MGF's mother   Colon cancer Other        MGM's father; dx after 26   Coronary artery disease Neg Hx    Allergies  Allergen Reactions   Amoxicillin Rash and Other (See Comments)    Has patient had a PCN reaction causing immediate rash, facial/tongue/throat swelling, SOB or lightheadedness with hypotension: No Has patient had a PCN reaction causing severe rash involving mucus membranes or skin necrosis: No Has patient had a PCN reaction that required treatment: #   #  #  YES  #  #  # - MD office Has patient had a PCN reaction occurring within the last 10 years: Yes If all of the above answers are "NO", then may proceed with Cephalosporin use.    Erythromycin Nausea Only   Hydrocodone-Acetaminophen Other (See Comments)    Hallucinations   Penicillin G Rash   Shellfish Allergy Itching and Other (See Comments)    REACTS TO SCALLOPS    Current Outpatient Medications  Medication Sig Dispense Refill   acetaminophen (TYLENOL) 500 MG tablet Take 1 tablet (500 mg total) by mouth every 8 (eight) hours for 10 days. 30 tablet 0   aspirin EC 325 MG tablet Take 1 tablet (325 mg total) by mouth daily.  30 tablet 0   ibuprofen (ADVIL) 800 MG tablet Take 1 tablet (800 mg total) by mouth every 8 (eight) hours for 10 days. Please take with food, please alternate with acetaminophen 30 tablet 0   oxyCODONE (OXY IR/ROXICODONE) 5 MG immediate release tablet Take 1 tablet (5 mg total) by mouth every 4 (four) hours as needed (severe pain). 20 tablet 0   albuterol (VENTOLIN HFA) 108 (90 Base) MCG/ACT inhaler INHALE 2 PUFFS INTO THE LUNGS EVERY 6 HOURS AS NEEDED FOR WHEEZING. 18 g 6   ALPRAZolam (XANAX) 0.5 MG tablet Take 1 tablet (0.5 mg total) by mouth 3 (three) times daily as needed. 60 tablet 3   anastrozole (ARIMIDEX) 1 MG tablet Take 1 tablet (1 mg total) by mouth daily. 30 tablet 12   cetirizine (ZYRTEC) 10 MG tablet TAKE 1 TABLET (10 MG TOTAL) BY MOUTH DAILY. 90 tablet 1   Cholecalciferol (VITAMIN D-3) 125 MCG (5000 UT) TABS Take 5,000 Units by mouth every evening.     fluticasone (FLONASE) 50 MCG/ACT nasal spray PLACE 2 SPRAYS INTO BOTH NOSTRILS DAILY. 16 g 6   montelukast (SINGULAIR) 10 MG tablet TAKE 1 TABLET (10 MG TOTAL) BY MOUTH DAILY. 90 tablet 1   pantoprazole (PROTONIX) 40 MG tablet Take 1 tablet (40 mg total) by mouth 2 (two) times daily. 180 tablet 1   Semaglutide, 1 MG/DOSE, (OZEMPIC, 1 MG/DOSE,) 4 MG/3ML SOPN Inject 1 mg as directed once a week. 3 mL 2    sertraline (ZOLOFT) 100 MG tablet Take 1 tablet (100 mg total) by mouth daily. 90 tablet 1   zolpidem (AMBIEN) 10 MG tablet Take 1 tablet (10 mg total) by mouth at bedtime as needed for sleep. 30 tablet 3   No current facility-administered medications for this visit.   No results found.  Review of Systems:   A ROS was performed including pertinent positives and negatives as documented in the HPI.  Physical Exam :   Constitutional: NAD and appears stated age Neurological: Alert and oriented Psych: Appropriate affect and cooperative There were no vitals taken for this visit.   Comprehensive Musculoskeletal Exam:    Musculoskeletal Exam    Inspection Right Left  Skin No atrophy or winging No atrophy or winging  Palpation    Tenderness Lateral and posterior deltoid None  Range of Motion    Flexion (passive) 145 170  Flexion (active) 110 with pain 170  Abduction 1100 170  ER at the side 20 70  Can reach behind back to L3 L3  Strength     4/5 supra/infraspinatus, 5/5 subscapularis 5/5  Special Tests    Pseudoparalytic No No  Neurologic    Fires PIN, radial, median, ulnar, musculocutaneous, axillary, suprascapular, long thoracic, and spinal accessory innervated muscles. No abnormal sensibility  Vascular/Lymphatic    Radial Pulse 2+ 2+  Cervical Exam    Patient has symmetric cervical range of motion with negative Spurling's test.  Special Test: positive drop arm, negative belly press, positive speeds with tenderness over biceps groove     Imaging:   Xray (3 views right shoulder): Right lateral acromial spurring as well as greater tuberosity cystic changes. Maintained Glenohumeral space  MRI (right shoulder): There is a full-thickness supraspinatus tear with extension into the infraspinatus.  This appears to be a component of a that is at the musculotendinous junction.  There is some retraction.  Negative tangent sign on T1 sagittal MRI.  There is biceps edema and tearing.   Subscapularis appears intact  I personally reviewed and interpreted the radiographs.   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  I did discuss that given the fact that she has trialed physical therapy and is plateaued I do believe that she would be a good candidate for surgical management.  We did discuss a possible injection although with that being said I believe the primary role of an injection is to allow for physical therapy and strengthening of the shoulder.  She has pursued this for 2 months now with limited effect and relief.  I did discuss the role for arthroscopic rotator cuff repair.  We did discuss that I would plan on performing a biceps tenodesis as well given her physical exam findings.  This would also be corroborated with intraoperative biceps examination.  I did also discuss that given the fact that this is a musculotendinous junction tear I believe there is a role for patch augmentation as well.  I have discussed with her the risks of operative versus nonoperative treatment including progression of fatty atrophy and need for ultimate reverse arthroplasty.  We did discuss the small possibility of potentially needing to perform a portion of the surgery in a mini open fashion which she understands.  Plan :    - plan for right shoulder arthroscopy with rotator cuff repair, biceps tenodesis, and collagen patch augmentation   After a lengthy discussion of treatment options, including risks, benefits, alternatives, complications of surgical and nonsurgical conservative options, the patient elected surgical repair.   The patient  is aware of the material risks  and complications including, but not limited to injury to adjacent structures, neurovascular injury, infection, numbness, bleeding, implant failure, thermal burns, stiffness, persistent pain, failure to heal, disease  transmission from allograft, need for further surgery, dislocation, anesthetic risks, blood clots, risks of death,and others. The probabilities of surgical success and failure discussed with patient given their particular co-morbidities.The time and nature of expected rehabilitation and recovery was discussed.The patient's questions were all answered preoperatively.  No barriers to understanding were noted. I explained the natural history of the disease process and Rx rationale.  I explained to the patient what I considered to be reasonable expectations given their personal situation.  The final treatment plan was arrived at through a shared patient decision making process model.   Patient was prescribed a shoulder immobilizer today for the diagnosis listed above under assessment. The patient requires stabilization from this orthosis in their right arm.     I personally saw and evaluated the patient, and participated in the management and treatment plan.  Vanetta Mulders, MD Attending Physician, Orthopedic Surgery  This document was dictated using Dragon voice recognition software. A reasonable attempt at proof reading has been made to minimize errors.

## 2021-08-15 ENCOUNTER — Encounter (HOSPITAL_BASED_OUTPATIENT_CLINIC_OR_DEPARTMENT_OTHER): Payer: Self-pay | Admitting: Orthopaedic Surgery

## 2021-08-15 ENCOUNTER — Telehealth: Payer: Self-pay

## 2021-08-15 NOTE — Telephone Encounter (Signed)
Caller name:Deziah Jake Michaelis  ? ?On DPR? :Yes ? ?Call back number:[phone] ? ?Provider they see: Birdie Riddle  ? ?Reason for call: Left message for Pt to call back for appt  ? ?

## 2021-08-16 ENCOUNTER — Encounter: Payer: Self-pay | Admitting: Family Medicine

## 2021-08-16 ENCOUNTER — Ambulatory Visit: Payer: 59 | Admitting: Family Medicine

## 2021-08-16 VITALS — BP 120/82 | HR 81 | Temp 97.5°F | Resp 16 | Wt 175.4 lb

## 2021-08-16 DIAGNOSIS — Z01818 Encounter for other preprocedural examination: Secondary | ICD-10-CM

## 2021-08-16 NOTE — Patient Instructions (Signed)
Follow up as needed or as scheduled ?Everything looks good so we'll sign the paperwork and send it over ?Call with any questions or concerns ?[employer]UCK!!!! ?

## 2021-08-16 NOTE — Progress Notes (Signed)
Subjective:    Patient ID: Katelyn Reyes, female    DOB: 14-Aug-1960, 61 y.o.   MRN: [id number]    Subjective:    Katelyn Reyes is a 61 y.o. female who presents to the office today for a preoperative consultation at the request of surgeon Bokshan who plans on performing R rotator cuff repair on  TBD  . This consultation is requested for the specific conditions prompting preoperative evaluation (i.e. because of potential affect on operative risk): obesity. Planned anesthesia: general and paracervical block. The patient has the following known anesthesia issues:  no hx of problems w/ anesthesia . Patients bleeding risk: no recent abnormal bleeding and no remote history of abnormal bleeding. Patient does not have objections to receiving blood products if needed.  The following portions of the patient's history were reviewed and updated as appropriate: allergies, current medications, past family history, past medical history, past social history, past surgical history, and problem list.  Review of Systems Pertinent items noted in HPI and remainder of comprehensive ROS otherwise negative.    Objective:    BP 120/82    Pulse 81    Temp (!) 97.5 F (36.4 C)    Resp 16    Wt 175 lb 6.4 oz (79.6 kg)    LMP  (LMP Unknown)    SpO2 96%    BMI 32.08 kg/m   General Appearance:    Alert, cooperative, no distress, appears stated age  Head:    Normocephalic, without obvious abnormality, atraumatic  Eyes:    PERRL, conjunctiva/corneas clear, EOM's intact, fundi    benign, both eyes  Ears:    Normal TM's and external ear canals, both ears  Nose:   Nares normal, septum midline, mucosa normal, no drainage    or sinus tenderness  Throat:   Lips, mucosa, and tongue normal; teeth and gums normal  Neck:   Supple, symmetrical, trachea midline, no adenopathy;    thyroid:  no enlargement/tenderness/nodules; no carotid   bruit or JVD  Back:     Symmetric, no curvature, ROM normal, no CVA tenderness  Lungs:      Clear to auscultation bilaterally, respirations unlabored  Chest Wall:    No tenderness or deformity   Heart:    Regular rate and rhythm, S1 and S2 normal, no murmur, rub   or gallop  Breast Exam:    No tenderness, masses, or nipple abnormality  Abdomen:     Soft, non-tender, bowel sounds active all four quadrants,    no masses, no organomegaly  Genitalia:    Normal female without lesion, discharge or tenderness  Rectal:    Normal tone, normal prostate, no masses or tenderness;   guaiac negative stool  Extremities:   Extremities normal, atraumatic, no cyanosis or edema  Pulses:   2+ and symmetric all extremities  Skin:   Skin color, texture, turgor normal, no rashes or lesions  Lymph nodes:   Cervical, supraclavicular, and axillary nodes normal  Neurologic:   CNII-XII intact, normal strength, sensation and reflexes    throughout    Predictors of intubation difficulty:  Morbid obesity? no  Anatomically abnormal facies? no  Prominent incisors? no  Receding mandible? no  Short, thick neck? no  Neck range of motion: normal  Dentition: No chipped, loose, or missing teeth.  Cardiographics ECG: normal sinus rhythm, no blocks or conduction defects, no ischemic changes Echocardiogram: not done  Imaging Chest x-ray:  NA    Lab Review  Appointment on  08/09/2021  Component Date Value   WBC Count 08/09/2021 6.1    RBC 08/09/2021 3.96    Hemoglobin 08/09/2021 12.3    HCT 08/09/2021 37.5    MCV 08/09/2021 94.7    MCH 08/09/2021 31.1    MCHC 08/09/2021 32.8    RDW 08/09/2021 11.9    Platelet Count 08/09/2021 330    nRBC 08/09/2021 0.0    Neutrophils Relative % 08/09/2021 36    Neutro Abs 08/09/2021 2.2    Lymphocytes Relative 08/09/2021 53    Lymphs Abs 08/09/2021 3.2    Monocytes Relative 08/09/2021 7    Monocytes Absolute 08/09/2021 0.4    Eosinophils Relative 08/09/2021 3    Eosinophils Absolute 08/09/2021 0.2    Basophils Relative 08/09/2021 1    Basophils Absolute  08/09/2021 0.0    Immature Granulocytes 08/09/2021 0    Abs Immature Granulocytes 08/09/2021 0.01    Sodium 08/09/2021 144    Potassium 08/09/2021 4.5    Chloride 08/09/2021 108    CO2 08/09/2021 30    Glucose, Bld 08/09/2021 91    BUN 08/09/2021 12    Creatinine 08/09/2021 1.01 (H)    Calcium 08/09/2021 9.5    Total Protein 08/09/2021 7.1    Albumin 08/09/2021 3.8    AST 08/09/2021 17    ALT 08/09/2021 18    Alkaline Phosphatase 08/09/2021 74    Total Bilirubin 08/09/2021 0.4    GFR, Estimated 08/09/2021 >60    Anion gap 08/09/2021 6    LDH 08/09/2021 157   Office Visit on 07/19/2021  Component Date Value   Cholesterol 07/19/2021 161    Triglycerides 07/19/2021 70.0    HDL 07/19/2021 57.80    VLDL 07/19/2021 14.0    LDL Cholesterol 07/19/2021 89    Total CHOL/HDL Ratio 07/19/2021 3    NonHDL 07/19/2021 102.85    Sodium 07/19/2021 141    Potassium 07/19/2021 3.5    Chloride 07/19/2021 103    CO2 07/19/2021 34 (H)    Glucose, Bld 07/19/2021 93    BUN 07/19/2021 12    Creatinine, Ser 07/19/2021 0.93    GFR 07/19/2021 67.93    Calcium 07/19/2021 9.2    TSH 07/19/2021 3.01    Total Bilirubin 07/19/2021 0.4    Bilirubin, Direct 07/19/2021 0.1    Alkaline Phosphatase 07/19/2021 66    AST 07/19/2021 19    ALT 07/19/2021 21    Total Protein 07/19/2021 6.8    Albumin 07/19/2021 4.0    WBC 07/19/2021 5.8    RBC 07/19/2021 4.00    Hemoglobin 07/19/2021 12.2    HCT 07/19/2021 37.2    MCV 07/19/2021 93.0    MCHC 07/19/2021 32.9    RDW 07/19/2021 12.6    Platelets 07/19/2021 307.0    Neutrophils Relative % 07/19/2021 36.2 (L)    Lymphocytes Relative 07/19/2021 49.1 (H)    Monocytes Relative 07/19/2021 11.8    Eosinophils Relative 07/19/2021 2.4    Basophils Relative 07/19/2021 0.5    Neutro Abs 07/19/2021 2.1    Lymphs Abs 07/19/2021 2.8    Monocytes Absolute 07/19/2021 0.7    Eosinophils Absolute 07/19/2021 0.1    Basophils Absolute 07/19/2021 0.0    VITD 07/19/2021  33.68    QuantiFERON-TB Gold Plus 07/19/2021 NEGATIVE    NIL 07/19/2021 0.13    Mitogen-NIL 07/19/2021 >10.00    TB1-NIL 07/19/2021 0.03    TB2-NIL 07/19/2021 0.02   Appointment on 06/28/2021  Component Date Value   WBC Count 06/28/2021 7.4  RBC 06/28/2021 4.17    Hemoglobin 06/28/2021 13.1    HCT 06/28/2021 41.0    MCV 06/28/2021 98.3    MCH 06/28/2021 31.4    MCHC 06/28/2021 32.0    RDW 06/28/2021 12.1    Platelet Count 06/28/2021 293    nRBC 06/28/2021 0.0    Neutrophils Relative % 06/28/2021 26    Neutro Abs 06/28/2021 1.9    Lymphocytes Relative 06/28/2021 57    Lymphs Abs 06/28/2021 4.2 (H)    Monocytes Relative 06/28/2021 8    Monocytes Absolute 06/28/2021 0.6    Eosinophils Relative 06/28/2021 9    Eosinophils Absolute 06/28/2021 0.6 (H)    Basophils Relative 06/28/2021 0    Basophils Absolute 06/28/2021 0.0    Immature Granulocytes 06/28/2021 0    Abs Immature Granulocytes 06/28/2021 0.02    Sodium 06/28/2021 145    Potassium 06/28/2021 4.0    Chloride 06/28/2021 104    CO2 06/28/2021 32    Glucose, Bld 06/28/2021 97    BUN 06/28/2021 16    Creatinine 06/28/2021 0.97    Calcium 06/28/2021 10.3    Total Protein 06/28/2021 7.0    Albumin 06/28/2021 4.3    AST 06/28/2021 23    ALT 06/28/2021 24    Alkaline Phosphatase 06/28/2021 75    Total Bilirubin 06/28/2021 0.5    GFR, Estimated 06/28/2021 >60    Anion gap 06/28/2021 9    LDH 06/28/2021 193 (H)   Office Visit on 06/04/2021  Component Date Value   Sodium 06/04/2021 140    Potassium 06/04/2021 3.9    Chloride 06/04/2021 105    CO2 06/04/2021 30    Glucose, Bld 06/04/2021 102 (H)    BUN 06/04/2021 8    Creatinine, Ser 06/04/2021 0.95    GFR 06/04/2021 66.28    Calcium 06/04/2021 9.1    Total Bilirubin 06/04/2021 0.5    Bilirubin, Direct 06/04/2021 0.1    Alkaline Phosphatase 06/04/2021 124 (H)    AST 06/04/2021 45 (H)    ALT 06/04/2021 69 (H)    Total Protein 06/04/2021 6.3    Albumin  06/04/2021 3.6   Admission on 05/31/2021, Discharged on 05/31/2021  Component Date Value   Sodium 05/31/2021 139    Potassium 05/31/2021 3.6    Chloride 05/31/2021 103    CO2 05/31/2021 29    Glucose, Bld 05/31/2021 99    BUN 05/31/2021 17    Creatinine, Ser 05/31/2021 1.30 (H)    Calcium 05/31/2021 9.3    Total Protein 05/31/2021 6.8    Albumin 05/31/2021 3.9    AST 05/31/2021 99 (H)    ALT 05/31/2021 172 (H)    Alkaline Phosphatase 05/31/2021 140 (H)    Total Bilirubin 05/31/2021 0.5    GFR, Estimated 05/31/2021 48 (L)    Anion gap 05/31/2021 7    Lipase 05/31/2021 17    WBC 05/31/2021 7.8    RBC 05/31/2021 4.41    Hemoglobin 05/31/2021 13.7    HCT 05/31/2021 41.7    MCV 05/31/2021 94.6    MCH 05/31/2021 31.1    MCHC 05/31/2021 32.9    RDW 05/31/2021 12.4    Platelets 05/31/2021 274    nRBC 05/31/2021 0.0    Neutrophils Relative % 05/31/2021 45    Neutro Abs 05/31/2021 3.4    Lymphocytes Relative 05/31/2021 41    Lymphs Abs 05/31/2021 3.2    Monocytes Relative 05/31/2021 7    Monocytes Absolute 05/31/2021 0.6    Eosinophils Relative 05/31/2021 6  Eosinophils Absolute 05/31/2021 0.4    Basophils Relative 05/31/2021 0    Basophils Absolute 05/31/2021 0.0    Immature Granulocytes 05/31/2021 1    Abs Immature Granulocytes 05/31/2021 0.10 (H)    Color, Urine 05/31/2021 COLORLESS (A)    APPearance 05/31/2021 CLEAR    Specific Gravity, Urine 05/31/2021 1.024    pH 05/31/2021 6.5    Glucose, UA 05/31/2021 NEGATIVE    Hgb urine dipstick 05/31/2021 NEGATIVE    Bilirubin Urine 05/31/2021 NEGATIVE    Ketones, ur 05/31/2021 NEGATIVE    Protein, ur 05/31/2021 NEGATIVE    Nitrite 05/31/2021 NEGATIVE    Leukocytes,Ua 05/31/2021 NEGATIVE    Campylobacter species 05/31/2021 NOT DETECTED    Plesimonas shigelloides 05/31/2021 NOT DETECTED    Salmonella species 05/31/2021 NOT DETECTED    Yersinia enterocolitica 05/31/2021 NOT DETECTED    Vibrio species 05/31/2021 NOT DETECTED     Vibrio cholerae 05/31/2021 NOT DETECTED    Enteroaggregative E coli* 05/31/2021 NOT DETECTED    Enteropathogenic E coli * 05/31/2021 NOT DETECTED    Enterotoxigenic E coli (* 05/31/2021 NOT DETECTED    Shiga like toxin produci* 05/31/2021 NOT DETECTED    Shigella/Enteroinvasive * 05/31/2021 NOT DETECTED    Cryptosporidium 05/31/2021 NOT DETECTED    Cyclospora cayetanensis 05/31/2021 NOT DETECTED    Entamoeba histolytica 05/31/2021 NOT DETECTED    Giardia lamblia 05/31/2021 NOT DETECTED    Adenovirus F40/41 05/31/2021 NOT DETECTED    Astrovirus 05/31/2021 NOT DETECTED    Norovirus GI/GII 05/31/2021 NOT DETECTED    Rotavirus A 05/31/2021 NOT DETECTED    Sapovirus (I, II, IV, an* 05/31/2021 NOT DETECTED   Appointment on 05/28/2021  Component Date Value   WBC Count 05/28/2021 7.5    RBC 05/28/2021 4.24    Hemoglobin 05/28/2021 13.2    HCT 05/28/2021 41.0    MCV 05/28/2021 96.7    MCH 05/28/2021 31.1    MCHC 05/28/2021 32.2    RDW 05/28/2021 12.2    Platelet Count 05/28/2021 293    nRBC 05/28/2021 0.0    Neutrophils Relative % 05/28/2021 41    Neutro Abs 05/28/2021 3.1    Lymphocytes Relative 05/28/2021 45    Lymphs Abs 05/28/2021 3.4    Monocytes Relative 05/28/2021 11    Monocytes Absolute 05/28/2021 0.8    Eosinophils Relative 05/28/2021 3    Eosinophils Absolute 05/28/2021 0.2    Basophils Relative 05/28/2021 0    Basophils Absolute 05/28/2021 0.0    Immature Granulocytes 05/28/2021 0    Abs Immature Granulocytes 05/28/2021 0.02    Sodium 05/28/2021 145    Potassium 05/28/2021 4.1    Chloride 05/28/2021 105    CO2 05/28/2021 29    Glucose, Bld 05/28/2021 116 (H)    BUN 05/28/2021 22 (H)    Creatinine 05/28/2021 1.26 (H)    Calcium 05/28/2021 9.8    Total Protein 05/28/2021 7.0    Albumin 05/28/2021 4.0    AST 05/28/2021 16    ALT 05/28/2021 19    Alkaline Phosphatase 05/28/2021 98    Total Bilirubin 05/28/2021 0.5    GFR, Estimated 05/28/2021 50 (L)    Anion  gap 05/28/2021 11    LDH 05/28/2021 180   Admission on 05/25/2021, Discharged on 05/25/2021  Component Date Value   WBC 05/25/2021 5.8    RBC 05/25/2021 3.96    Hemoglobin 05/25/2021 12.3    HCT 05/25/2021 37.7    MCV 05/25/2021 95.2    MCH 05/25/2021 31.1    MCHC 05/25/2021  32.6    RDW 05/25/2021 12.3    Platelets 05/25/2021 269    nRBC 05/25/2021 0.0    Neutrophils Relative % 05/25/2021 42    Neutro Abs 05/25/2021 2.4    Lymphocytes Relative 05/25/2021 49    Lymphs Abs 05/25/2021 2.8    Monocytes Relative 05/25/2021 6    Monocytes Absolute 05/25/2021 0.3    Eosinophils Relative 05/25/2021 3    Eosinophils Absolute 05/25/2021 0.2    Basophils Relative 05/25/2021 0    Basophils Absolute 05/25/2021 0.0    Immature Granulocytes 05/25/2021 0    Abs Immature Granulocytes 05/25/2021 0.01    Sodium 05/25/2021 138    Potassium 05/25/2021 3.2 (L)    Chloride 05/25/2021 100    CO2 05/25/2021 28    Glucose, Bld 05/25/2021 124 (H)    BUN 05/25/2021 15    Creatinine, Ser 05/25/2021 0.98    Calcium 05/25/2021 9.8    Total Protein 05/25/2021 7.7    Albumin 05/25/2021 4.2    AST 05/25/2021 28    ALT 05/25/2021 29    Alkaline Phosphatase 05/25/2021 88    Total Bilirubin 05/25/2021 0.5    GFR, Estimated 05/25/2021 >60    Anion gap 05/25/2021 10    Lipase 05/25/2021 24    Color, Urine 05/25/2021 COLORLESS (A)    APPearance 05/25/2021 CLEAR    Specific Gravity, Urine 05/25/2021 1.006    pH 05/25/2021 6.5    Glucose, UA 05/25/2021 NEGATIVE    Hgb urine dipstick 05/25/2021 TRACE (A)    Bilirubin Urine 05/25/2021 NEGATIVE    Ketones, ur 05/25/2021 NEGATIVE    Protein, ur 05/25/2021 NEGATIVE    Nitrite 05/25/2021 NEGATIVE    Leukocytes,Ua 05/25/2021 NEGATIVE    RBC / HPF 05/25/2021 0-5    WBC, UA 05/25/2021 0-5    Squamous Epithelial / LPF 05/25/2021 0-5    Troponin I (High Sensiti* 05/25/2021 3   Admission on 04/26/2021, Discharged on 04/27/2021  Component Date Value   Preg  Test, Ur 04/26/2021 NEGATIVE    SURGICAL PATHOLOGY 04/26/2021                     Value:SURGICAL PATHOLOGY CASE: [id number] PATIENT: [facility] Surgical Pathology Report     Clinical History: right breast cancer (cm)     FINAL MICROSCOPIC DIAGNOSIS:  A. BREAST, LEFT, MASTECTOMY: - Columnar cell change with calcifications. - Fibrocystic changes. - No malignancy identified.  B. BREAST, RIGHT, MASTECTOMY: - No residual carcinoma identified.  See oncology table/comment. - Biopsy site changes. - Columnar cell change.  C. LYMPH NODE, RIGHT, SENTINEL, EXCISION: - 1 lymph node, no metastatic carcinoma (0/1).  ONCOLOGY TABLE:  INVASIVE CARCINOMA OF THE BREAST:  Resection  Procedure: Mastectomy Specimen Laterality: Right Histologic Type: Invasive ductal carcinoma ([id number]) Histologic Grade: Low-grade Tumor Size: 0.4 cm Ductal Carcinoma In Situ: Present Tumor Extent: Limited to breast parenchyma Treatment Effect in the Breast: No known presurgical therapy Margins: All margins negative for invasive carcinoma      Distance from Closes                         t Margin: Undefined DCIS Margins: Uninvolved by DCIS      Distance from Closest Margin: Undefined Regional Lymph Nodes: Present      Number of Lymph Nodes Examined: 1      Number of Sentinel Nodes Examined: 1      No metastatic carcinoma identified Distant Metastasis:  Distant Site(s) Involved: Not applicable Breast Biomarker Testing Performed on Previous Biopsy:      Testing Performed on Case Number: [id number]            Estrogen Receptor: 95%, positive, moderate staining            Progesterone Receptor: 100%, positive, strong staining            HER2: Negative (by FISH)            Ki-67: 5% Pathologic Stage Classification (pTNM, AJCC 8th Edition): pT1a, pN0 Representative Tumor Block: [id number] block 1A Comment(s): As noted in the diagnosis, no residual malignancy is identified.  Tumor data is  obtained from the prior biopsy.  (v4.5.0.0)     GROSS DESCRIPTION:  A: Specimen: Received fresh and placed in formalin at 2:30 PM on 04/26/2021, and labeled l                         eft breast tissue Specimen integrity (intact/disrupted): The specimen is focally disrupted, with a suture designating the lateral skin. Weight: 566 g Size: 17.1 cm medial to lateral by 16.5 cm superior to inferior by 4.5 cm anterior to posterior, with an additional unoriented portion of soft tissue measuring 8.6 x 5.5 x 2.7 cm. Skin: There is a triangular portion of tan-brown skin, measuring 6.5 x 6.0 cm, with a 1.5 x 1.4 cm brown everted nipple.  The skin surface is grossly unremarkable. Parenchyma: Yellow lobulated adipose tissue with tan-white fibrous tissue (approximately 10% fibrous).  No lesions are grossly identified. Prognostic indicators: Obtain from paraffin blocks if needed Lymph nodes: None identified Block summary: 8 blocks submitted 1 and 2 = upper outer quadrant 3 and 4 = lower outer quadrant 5 and 6 = upper inner quadrant 7 and 8 = lower inner quadrant  B: Specimen: Received fresh and placed in formalin at 2:35 PM on 04/26/2021, and labeled right b                         reast tissue Specimen integrity (intact/disrupted): Intact, with a suture designating the lateral skin Weight: 514 g Size: 16.1 cm medial to lateral by 15.2 cm superior to inferior by 5.1 cm anterior to posterior Skin: There is a triangular portion of tan-brown skin, measuring 6.5 x 6.0 cm with a 1.4 x 1.4 cm brown everted nipple.  The skin surface is grossly unremarkable. Tumor/cavity: Within the upper outer quadrant, there is a 1.5 x 1.2 x 1.1 cm tan-yellow, firm, centrally hemorrhagic possible lesion identified.  Within the lesion a silver metallic coil shaped biopsy clip is identified.  The area of interest measures 0.5 cm to the anterior resection margin, and 2.0 cm to the posterior margin.  Identified  at the anterior surface, approximately 1.0 cm from the hemorrhagic center of the area surrounding the coil clip, a silver metallic ribbon shaped biopsy clip is identified. Uninvolved parenchyma: Yellow lobulated adipose tissue with tan-white fibrous tissue (approxi                         mately 15% fibrous). Prognostic indicators: Obtain from paraffin blocks if needed Lymph nodes: None identified Block summary: 10 blocks submitted 1 = posterior margin closest to area of interest 2-6 = area surrounding coil and ribbon clips (anterior margin yellow) 7 = upper outer quadrant 8 = lower outer quadrant 9 = upper inner quadrant 10 =  lower inner quadrant  C: The specimen is received fresh and consists of a 3.6 x 1.7 x 1.3 cm tan-yellow possible lymph node.  The specimen is trisected and entirely submitted in 3 cassettes. Craig Staggers 04/27/2021)    Final Diagnosis performed by Mark Martinique, MD.   Electronically signed 04/30/2021 Technical and / or Professional components performed at [facility], [place] [address], [place], [place] [address].  Immunohistochemistry Technical component (if applicable) was performed at [facility]. [address], [place], [place], [place] [address].   IMMUNOHISTOCHEMISTRY DISCLAIMER (if applicable)                         : Some of these immunohistochemical stains may have been developed and the performance characteristics determine by [facility]. Some may not have been cleared or approved by the U.S. Food and Drug Administration. The FDA has determined that such clearance or approval is not necessary. This test is used for clinical purposes. It should not be regarded as investigational or for research. This laboratory is certified under the [place] (CLIA-88) as qualified to perform high complexity clinical laboratory testing.  The controls stained appropriately.    Appointment on 04/15/2021  Component Date Value   LDH 04/15/2021 181    Sodium 04/15/2021 140    Potassium 04/15/2021 3.9    Chloride 04/15/2021 104    CO2 04/15/2021 28    Glucose, Bld 04/15/2021 106 (H)    BUN 04/15/2021 11    Creatinine 04/15/2021 0.94    Calcium 04/15/2021 10.0    Total Protein 04/15/2021 7.6    Albumin 04/15/2021 4.3    AST 04/15/2021 21    ALT 04/15/2021 17    Alkaline Phosphatase 04/15/2021 78    Total Bilirubin 04/15/2021 0.5    GFR, Estimated 04/15/2021 >60    Anion gap 04/15/2021 8    WBC Count 04/15/2021 5.8    RBC 04/15/2021 4.21    Hemoglobin 04/15/2021 13.0    HCT 04/15/2021 39.9    MCV 04/15/2021 94.8    MCH 04/15/2021 30.9    MCHC 04/15/2021 32.6    RDW 04/15/2021 11.9    Platelet Count 04/15/2021 328    nRBC 04/15/2021 0.0    Neutrophils Relative % 04/15/2021 36    Neutro Abs 04/15/2021 2.1    Lymphocytes Relative 04/15/2021 53    Lymphs Abs 04/15/2021 3.1    Monocytes Relative 04/15/2021 7    Monocytes Absolute 04/15/2021 0.4    Eosinophils Relative 04/15/2021 3    Eosinophils Absolute 04/15/2021 0.2    Basophils Relative 04/15/2021 1    Basophils Absolute 04/15/2021 0.0    Immature Granulocytes 04/15/2021 0    Abs Immature Granulocytes 04/15/2021 0.02   Appointment on 04/11/2021  Component Date Value   Genetic Screening Order 04/11/2021 Collected by Laboratory   There may be more visits with results that are not included.      Assessment:      61 y.o. female with planned surgery as above.   Known risk factors for perioperative complications: None   Difficulty with intubation is not anticipated.  Cardiac Risk Estimation: low  Current medications which may produce withdrawal symptoms if withheld perioperatively: none    Plan:    1. Preoperative workup as follows ECG. 2. Change in medication regimen before surgery: none, continue medication regimen including morning of surgery, with sip of water. 3. Prophylaxis for  cardiac  events with perioperative beta-blockers: not indicated. 4. Invasive hemodynamic monitoring perioperatively: at the discretion of anesthesiologist. 5. Deep vein thrombosis prophylaxis postoperatively:regimen to be chosen by surgical team. 6. Surveillance for postoperative MI with ECG immediately postoperatively and on postoperative days 1 and 2 AND troponin levels 24 hours postoperatively and on day 4 or hospital discharge (whichever comes first): at the discretion of anesthesiologist. 7. Other measures:  none

## 2021-08-20 ENCOUNTER — Other Ambulatory Visit: Payer: Self-pay

## 2021-08-20 ENCOUNTER — Encounter (HOSPITAL_BASED_OUTPATIENT_CLINIC_OR_DEPARTMENT_OTHER): Payer: Self-pay | Admitting: Orthopaedic Surgery

## 2021-08-28 ENCOUNTER — Telehealth: Payer: Self-pay | Admitting: Orthopaedic Surgery

## 2021-08-28 NOTE — Telephone Encounter (Signed)
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 Accepted 08/28/21 ?

## 2021-08-28 NOTE — Anesthesia Preprocedure Evaluation (Addendum)
Anesthesia Evaluation  ?Patient identified by MRN, date of birth, ID band ?Patient awake ? ? ? ?Reviewed: ?Allergy & Precautions, NPO status , Patient's Chart, lab work & pertinent test results ? ?Airway ?Mallampati: II ? ?TM Distance: >3 FB ?Neck ROM: Full ? ? ? Dental ?no notable dental hx. ? ?  ?Pulmonary ?asthma ,  ?  ?Pulmonary exam normal ? ? ? ? ? ? ? Cardiovascular ?negative cardio ROS ? ? ?Rhythm:Regular Rate:Normal ? ? ?  ?Neuro/Psych ?Anxiety Depression negative neurological ROS ?   ? GI/Hepatic ?GERD  Medicated,(+) Hepatitis -  ?Endo/Other  ?negative endocrine ROS ? Renal/GU ?negative Renal ROS  ?negative genitourinary ?  ?Musculoskeletal ? ?(+) Arthritis , Osteoarthritis,   ? Abdominal ?Normal abdominal exam  (+)   ?Peds ? Hematology ?negative hematology ROS ?(+)   ?Anesthesia Other Findings ? ? Reproductive/Obstetrics ? ?  ? ? ? ? ? ? ? ? ? ? ? ? ? ?  ?  ? ? ? ? ? ? ? ?Anesthesia Physical ?Anesthesia Plan ? ?ASA: 2 ? ?Anesthesia Plan: General and Regional  ? ?Post-op Pain Management: Regional block*, Tylenol PO (pre-op)* and Gabapentin PO (pre-op)*  ? ?Induction: Intravenous ? ?PONV Risk Score and Plan: 3 and Ondansetron, Dexamethasone, Midazolam and Treatment may vary due to age or medical condition ? ?Airway Management Planned: Mask and Oral ETT ? ?Additional Equipment: None ? ?Intra-op Plan:  ? ?Post-operative Plan: Extubation in OR ? ?Informed Consent: I have reviewed the patients History and Physical, chart, labs and discussed the procedure including the risks, benefits and alternatives for the proposed anesthesia with the patient or authorized representative who has indicated his/her understanding and acceptance.  ? ? ? ?Dental advisory given ? ?Plan Discussed with: CRNA ? ?Anesthesia Plan Comments: (Lab Results ?     Component                Value               Date                 ?     WBC                      6.1                 08/09/2021           ?     HGB                       12.3                08/09/2021           ?     HCT                      37.5                08/09/2021           ?     MCV                      94.7                08/09/2021           ?     PLT  330                 08/09/2021           ?Lab Results ?     Component                Value               Date                 ?     NA                       144                 08/09/2021           ?     K                        4.5                 08/09/2021           ?     CO2                      30                  08/09/2021           ?     GLUCOSE                  91                  08/09/2021           ?     BUN                      12                  08/09/2021           ?     CREATININE               1.01 (H)            08/09/2021           ?     CALCIUM                  9.5                 08/09/2021           ?     GFRNONAA                 >60                 08/09/2021          )  ? ? ? ? ? ?Anesthesia Quick Evaluation ? ?

## 2021-08-29 ENCOUNTER — Ambulatory Visit (HOSPITAL_BASED_OUTPATIENT_CLINIC_OR_DEPARTMENT_OTHER)
Admission: RE | Admit: 2021-08-29 | Discharge: 2021-08-29 | Disposition: A | Discharge status: Home / Self Care | Payer: 59 | Source: Ambulatory Visit | Attending: Orthopaedic Surgery | Admitting: Orthopaedic Surgery

## 2021-08-29 ENCOUNTER — Ambulatory Visit (HOSPITAL_BASED_OUTPATIENT_CLINIC_OR_DEPARTMENT_OTHER): Payer: 59 | Admitting: Anesthesiology

## 2021-08-29 ENCOUNTER — Encounter (HOSPITAL_BASED_OUTPATIENT_CLINIC_OR_DEPARTMENT_OTHER): Payer: Self-pay | Admitting: Orthopaedic Surgery

## 2021-08-29 ENCOUNTER — Other Ambulatory Visit: Payer: Self-pay

## 2021-08-29 ENCOUNTER — Encounter (HOSPITAL_BASED_OUTPATIENT_CLINIC_OR_DEPARTMENT_OTHER)
Admission: RE | Disposition: A | Discharge status: Home / Self Care | Payer: Self-pay | Source: Ambulatory Visit | Attending: Orthopaedic Surgery

## 2021-08-29 DIAGNOSIS — M7521 Bicipital tendinitis, right shoulder: Secondary | ICD-10-CM

## 2021-08-29 DIAGNOSIS — F418 Other specified anxiety disorders: Secondary | ICD-10-CM | POA: Diagnosis not present

## 2021-08-29 DIAGNOSIS — S46011A Strain of muscle(s) and tendon(s) of the rotator cuff of right shoulder, initial encounter: Secondary | ICD-10-CM | POA: Diagnosis not present

## 2021-08-29 DIAGNOSIS — Z9013 Acquired absence of bilateral breasts and nipples: Secondary | ICD-10-CM | POA: Insufficient documentation

## 2021-08-29 DIAGNOSIS — J45909 Unspecified asthma, uncomplicated: Secondary | ICD-10-CM | POA: Diagnosis not present

## 2021-08-29 DIAGNOSIS — G8918 Other acute postprocedural pain: Secondary | ICD-10-CM | POA: Diagnosis not present

## 2021-08-29 DIAGNOSIS — C50919 Malignant neoplasm of unspecified site of unspecified female breast: Secondary | ICD-10-CM | POA: Diagnosis not present

## 2021-08-29 DIAGNOSIS — X58XXXA Exposure to other specified factors, initial encounter: Secondary | ICD-10-CM | POA: Insufficient documentation

## 2021-08-29 DIAGNOSIS — M75121 Complete rotator cuff tear or rupture of right shoulder, not specified as traumatic: Secondary | ICD-10-CM | POA: Diagnosis not present

## 2021-08-29 DIAGNOSIS — K219 Gastro-esophageal reflux disease without esophagitis: Secondary | ICD-10-CM | POA: Diagnosis not present

## 2021-08-29 DIAGNOSIS — I890 Lymphedema, not elsewhere classified: Secondary | ICD-10-CM | POA: Diagnosis not present

## 2021-08-29 HISTORY — DX: Unspecified rotator cuff tear or rupture of unspecified shoulder, not specified as traumatic: M75.100

## 2021-08-29 HISTORY — PX: SHOULDER ARTHROSCOPY WITH ROTATOR CUFF REPAIR AND OPEN BICEPS TENODESIS: SHX6677

## 2021-08-29 SURGERY — SHOULDER ARTHROSCOPY WITH ROTATOR CUFF REPAIR AND OPEN BICEPS TENODESIS
Anesthesia: Regional | Site: Shoulder | Laterality: Right

## 2021-08-29 MED ORDER — CEFAZOLIN SODIUM-DEXTROSE 2-4 GM/100ML-% IV SOLN
2.0000 g | INTRAVENOUS | Status: AC
  Administered 2021-08-29: 2 g via INTRAVENOUS

## 2021-08-29 MED ORDER — MIDAZOLAM HCL 2 MG/2ML IJ SOLN
INTRAMUSCULAR | Status: AC
  Filled 2021-08-29: qty 2

## 2021-08-29 MED ORDER — FENTANYL CITRATE (PF) 100 MCG/2ML IJ SOLN
INTRAMUSCULAR | Status: AC
  Filled 2021-08-29: qty 2

## 2021-08-29 MED ORDER — FENTANYL CITRATE (PF) 100 MCG/2ML IJ SOLN
100.0000 ug | Freq: Once | INTRAMUSCULAR | Status: AC
  Administered 2021-08-29: 100 ug via INTRAVENOUS

## 2021-08-29 MED ORDER — PHENYLEPHRINE 40 MCG/ML (10ML) SYRINGE FOR IV PUSH (FOR BLOOD PRESSURE SUPPORT)
PREFILLED_SYRINGE | INTRAVENOUS | Status: AC
  Filled 2021-08-29: qty 10

## 2021-08-29 MED ORDER — PROPOFOL 10 MG/ML IV BOLUS
INTRAVENOUS | Status: AC
  Filled 2021-08-29: qty 20

## 2021-08-29 MED ORDER — DEXAMETHASONE SODIUM PHOSPHATE 10 MG/ML IJ SOLN
INTRAMUSCULAR | Status: AC
  Filled 2021-08-29: qty 1

## 2021-08-29 MED ORDER — EPHEDRINE SULFATE (PRESSORS) 50 MG/ML IJ SOLN
INTRAMUSCULAR | Status: DC | PRN
  Administered 2021-08-29: 5 mg via INTRAVENOUS
  Administered 2021-08-29 (×2): 10 mg via INTRAVENOUS

## 2021-08-29 MED ORDER — SODIUM CHLORIDE 0.9 % IR SOLN
Status: DC | PRN
  Administered 2021-08-29 (×6): 3000 mL

## 2021-08-29 MED ORDER — SODIUM CHLORIDE 0.9 % IR SOLN
Status: DC | PRN
  Administered 2021-08-29 (×2): 3000 mL

## 2021-08-29 MED ORDER — GABAPENTIN 300 MG PO CAPS
ORAL_CAPSULE | ORAL | Status: AC
  Filled 2021-08-29: qty 1

## 2021-08-29 MED ORDER — PHENYLEPHRINE HCL (PRESSORS) 10 MG/ML IV SOLN
INTRAVENOUS | Status: AC
  Filled 2021-08-29: qty 1

## 2021-08-29 MED ORDER — ONDANSETRON HCL 4 MG/2ML IJ SOLN
INTRAMUSCULAR | Status: AC
  Filled 2021-08-29: qty 2

## 2021-08-29 MED ORDER — LIDOCAINE 2% (20 MG/ML) 5 ML SYRINGE
INTRAMUSCULAR | Status: AC
  Filled 2021-08-29: qty 5

## 2021-08-29 MED ORDER — PROPOFOL 10 MG/ML IV BOLUS
INTRAVENOUS | Status: DC | PRN
  Administered 2021-08-29: 150 mg via INTRAVENOUS
  Administered 2021-08-29: 20 mg via INTRAVENOUS

## 2021-08-29 MED ORDER — BUPIVACAINE HCL (PF) 0.5 % IJ SOLN
INTRAMUSCULAR | Status: DC | PRN
  Administered 2021-08-29: 15 mL via PERINEURAL

## 2021-08-29 MED ORDER — EPINEPHRINE PF 1 MG/ML IJ SOLN
INTRAMUSCULAR | Status: AC
  Filled 2021-08-29: qty 1

## 2021-08-29 MED ORDER — BUPIVACAINE HCL (PF) 0.25 % IJ SOLN
INTRAMUSCULAR | Status: AC
  Filled 2021-08-29: qty 30

## 2021-08-29 MED ORDER — MIDAZOLAM HCL 2 MG/2ML IJ SOLN
2.0000 mg | Freq: Once | INTRAMUSCULAR | Status: AC
  Administered 2021-08-29: 2 mg via INTRAVENOUS

## 2021-08-29 MED ORDER — SUGAMMADEX SODIUM 200 MG/2ML IV SOLN
INTRAVENOUS | Status: DC | PRN
  Administered 2021-08-29: 200 mg via INTRAVENOUS

## 2021-08-29 MED ORDER — LIDOCAINE 2% (20 MG/ML) 5 ML SYRINGE
INTRAMUSCULAR | Status: DC | PRN
  Administered 2021-08-29: 50 mg via INTRAVENOUS

## 2021-08-29 MED ORDER — FENTANYL CITRATE (PF) 100 MCG/2ML IJ SOLN
INTRAMUSCULAR | Status: DC | PRN
  Administered 2021-08-29: 50 ug via INTRAVENOUS

## 2021-08-29 MED ORDER — PHENYLEPHRINE HCL-NACL 20-0.9 MG/250ML-% IV SOLN
INTRAVENOUS | Status: DC | PRN
  Administered 2021-08-29: 25 ug/min via INTRAVENOUS

## 2021-08-29 MED ORDER — ACETAMINOPHEN 500 MG PO TABS
ORAL_TABLET | ORAL | Status: AC
  Filled 2021-08-29: qty 2

## 2021-08-29 MED ORDER — TRANEXAMIC ACID-NACL 1000-0.7 MG/100ML-% IV SOLN
1000.0000 mg | INTRAVENOUS | Status: AC
  Administered 2021-08-29: 1000 mg via INTRAVENOUS

## 2021-08-29 MED ORDER — ONDANSETRON HCL 4 MG/2ML IJ SOLN
INTRAMUSCULAR | Status: DC | PRN
  Administered 2021-08-29: 4 mg via INTRAVENOUS

## 2021-08-29 MED ORDER — TRANEXAMIC ACID-NACL 1000-0.7 MG/100ML-% IV SOLN
INTRAVENOUS | Status: AC
  Filled 2021-08-29: qty 100

## 2021-08-29 MED ORDER — BUPIVACAINE LIPOSOME 1.3 % IJ SUSP
INTRAMUSCULAR | Status: DC | PRN
  Administered 2021-08-29: 10 mL via PERINEURAL

## 2021-08-29 MED ORDER — FENTANYL CITRATE (PF) 100 MCG/2ML IJ SOLN: 25.0000 ug | INTRAMUSCULAR | Status: DC | PRN

## 2021-08-29 MED ORDER — EPHEDRINE 5 MG/ML INJ
INTRAVENOUS | Status: AC
  Filled 2021-08-29: qty 5

## 2021-08-29 MED ORDER — LACTATED RINGERS IV SOLN: INTRAVENOUS | Status: DC

## 2021-08-29 MED ORDER — ROCURONIUM BROMIDE 10 MG/ML (PF) SYRINGE
PREFILLED_SYRINGE | INTRAVENOUS | Status: AC
  Filled 2021-08-29: qty 10

## 2021-08-29 MED ORDER — CEFAZOLIN SODIUM-DEXTROSE 2-4 GM/100ML-% IV SOLN
INTRAVENOUS | Status: AC
  Filled 2021-08-29: qty 100

## 2021-08-29 MED ORDER — PHENYLEPHRINE HCL (PRESSORS) 10 MG/ML IV SOLN
INTRAVENOUS | Status: DC | PRN
  Administered 2021-08-29 (×3): 80 ug via INTRAVENOUS

## 2021-08-29 MED ORDER — ROCURONIUM BROMIDE 100 MG/10ML IV SOLN
INTRAVENOUS | Status: DC | PRN
Start: 2021-08-29 — End: 2021-08-29
  Administered 2021-08-29: 60 mg via INTRAVENOUS

## 2021-08-29 MED ORDER — GABAPENTIN 300 MG PO CAPS
300.0000 mg | ORAL_CAPSULE | Freq: Once | ORAL | Status: AC
  Administered 2021-08-29: 300 mg via ORAL

## 2021-08-29 MED ORDER — ACETAMINOPHEN 500 MG PO TABS
1000.0000 mg | ORAL_TABLET | Freq: Once | ORAL | Status: AC
  Administered 2021-08-29: 1000 mg via ORAL

## 2021-08-29 MED ORDER — DEXAMETHASONE SODIUM PHOSPHATE 10 MG/ML IJ SOLN
INTRAMUSCULAR | Status: DC | PRN
  Administered 2021-08-29: 10 mg via INTRAVENOUS

## 2021-08-29 SURGICAL SUPPLY — 77 items
AID PSTN UNV HD RSTRNT DISP (MISCELLANEOUS) ×1
ANCH SUT 2 1.5X2.9 2 LD (Anchor) ×2 IMPLANT
ANCH SUT 5 KNTLS ST (Anchor) ×2 IMPLANT
ANCH SUT BN ASCP DLV (Anchor) ×1 IMPLANT
ANCH SUT RGNRT REGENETEN (Staple) ×1 IMPLANT
ANCHOR BONE REGENETEN (Anchor) ×1 IMPLANT
ANCHOR JUGGERKNOT 2X2.9 WH/BL (Anchor) ×2 IMPLANT
ANCHOR SUT KNTLSS ST 5 (Anchor) ×2 IMPLANT
ANCHOR TENDON REGENETEN (Staple) ×1 IMPLANT
APL PRP STRL LF DISP 70% ISPRP (MISCELLANEOUS) ×2
BIT DRILL JUGGERKNOT 2.9 (BIT) ×1 IMPLANT
BLADE EXCALIBUR 4.0X13 (MISCELLANEOUS) ×2 IMPLANT
BURR OVAL 8 FLU 4.0X13 (MISCELLANEOUS) IMPLANT
CANNULA 5.75X71 LONG (CANNULA) ×1 IMPLANT
CANNULA 7X7 TWIST-IN (CANNULA) IMPLANT
CANNULA PASSPORT 5 (CANNULA) IMPLANT
CANNULA PASSPORT BUTTON 10-40 (CANNULA) IMPLANT
CANNULA TWIST IN 8.25X7CM (CANNULA) ×1 IMPLANT
CHLORAPREP W/TINT 26 (MISCELLANEOUS) ×4 IMPLANT
COOLER ICEMAN CLASSIC (MISCELLANEOUS) ×2 IMPLANT
DRAPE IMP U-DRAPE 54X76 (DRAPES) ×2 IMPLANT
DRAPE INCISE IOBAN 66X45 STRL (DRAPES) ×2 IMPLANT
DRAPE SHOULDER BEACH CHAIR (DRAPES) ×2 IMPLANT
DRAPE U-SHAPE 47X51 STRL (DRAPES) ×4 IMPLANT
DRSG PAD ABDOMINAL 8X10 ST (GAUZE/BANDAGES/DRESSINGS) ×2 IMPLANT
DRSG TEGADERM 4X4.75 (GAUZE/BANDAGES/DRESSINGS) ×3 IMPLANT
DW OUTFLOW CASSETTE/TUBE SET (MISCELLANEOUS) ×2 IMPLANT
GAUZE SPONGE 4X4 12PLY STRL (GAUZE/BANDAGES/DRESSINGS) ×2 IMPLANT
GAUZE XEROFORM 1X8 LF (GAUZE/BANDAGES/DRESSINGS) ×2 IMPLANT
GLOVE SRG 8 PF TXTR STRL LF DI (GLOVE) ×1 IMPLANT
GLOVE SURG ENC MOIS LTX SZ6 (GLOVE) ×4 IMPLANT
GLOVE SURG ENC MOIS LTX SZ7.5 (GLOVE) ×2 IMPLANT
GLOVE SURG LTX SZ8 (GLOVE) ×2 IMPLANT
GLOVE SURG SYN 7.5  E (GLOVE) ×2
GLOVE SURG SYN 7.5 E (GLOVE) ×1 IMPLANT
GLOVE SURG SYN 7.5 PF PI (GLOVE) ×1 IMPLANT
GLOVE SURG UNDER POLY LF SZ6.5 (GLOVE) ×2 IMPLANT
GLOVE SURG UNDER POLY LF SZ8 (GLOVE) ×2
GOWN STRL REUS W/ TWL LRG LVL3 (GOWN DISPOSABLE) ×2 IMPLANT
GOWN STRL REUS W/ TWL XL LVL3 (GOWN DISPOSABLE) ×1 IMPLANT
GOWN STRL REUS W/TWL LRG LVL3 (GOWN DISPOSABLE) ×2
GOWN STRL REUS W/TWL XL LVL3 (GOWN DISPOSABLE) ×4 IMPLANT
IMPL REGENETEN MEDIUM (Shoulder) IMPLANT
IMPLANT REGENETEN MEDIUM (Shoulder) ×2 IMPLANT
KIT SHOULDER STAB MARCO (KITS) ×2 IMPLANT
KIT STR SPEAR 1.8 FBRTK DISP (KITS) IMPLANT
LASSO 90 CVE QUICKPAS (DISPOSABLE) IMPLANT
LASSO CRESCENT QUICKPASS (SUTURE) IMPLANT
MANIFOLD NEPTUNE II (INSTRUMENTS) ×2 IMPLANT
NDL SAFETY ECLIPSE 18X1.5 (NEEDLE) ×1 IMPLANT
NDL SCORPION MULTI FIRE (NEEDLE) IMPLANT
NEEDLE HYPO 18GX1.5 SHARP (NEEDLE) ×2
NEEDLE SCORPION MULTI FIRE (NEEDLE) IMPLANT
PACK ARTHROSCOPY DSU (CUSTOM PROCEDURE TRAY) ×2 IMPLANT
PACK BASIN DAY SURGERY FS (CUSTOM PROCEDURE TRAY) ×2 IMPLANT
PAD COLD SHLDR WRAP-ON (PAD) ×2 IMPLANT
PASSER SUT FIRSTPASS SELF (INSTRUMENTS) ×1 IMPLANT
PORT APPOLLO RF 90DEGREE MULTI (SURGICAL WAND) ×2 IMPLANT
RESTRAINT HEAD UNIVERSAL NS (MISCELLANEOUS) ×2 IMPLANT
SHEET MEDIUM DRAPE 40X70 STRL (DRAPES) ×1 IMPLANT
SLEEVE SCD COMPRESS KNEE MED (STOCKING) ×2 IMPLANT
SUT BROADBAND TAPE 2PK 1.5 (SUTURE) ×1 IMPLANT
SUT ETHILON 3 0 PS 1 (SUTURE) ×2 IMPLANT
SUT FIBERWIRE #2 38 T-5 BLUE (SUTURE)
SUT PDS AB 1 CT  36 (SUTURE)
SUT PDS AB 1 CT 36 (SUTURE) IMPLANT
SUT TIGER TAPE 7 IN WHITE (SUTURE) IMPLANT
SUTURE FIBERWR #2 38 T-5 BLUE (SUTURE) IMPLANT
SUTURE TAPE 1.3 40 TPR END (SUTURE) IMPLANT
SUTURE TAPE TIGERLINK 1.3MM BL (SUTURE) IMPLANT
SUTURETAPE 1.3 40 TPR END (SUTURE)
SUTURETAPE TIGERLINK 1.3MM BL (SUTURE)
SYR 5ML LL (SYRINGE) ×2 IMPLANT
TAPE FIBER 2MM 7IN #2 BLUE (SUTURE) IMPLANT
TOWEL GREEN STERILE FF (TOWEL DISPOSABLE) ×4 IMPLANT
TUBE CONNECTING 20X1/4 (TUBING) ×2 IMPLANT
TUBING ARTHROSCOPY IRRIG 16FT (MISCELLANEOUS) ×2 IMPLANT

## 2021-08-29 NOTE — Anesthesia Procedure Notes (Signed)
Anesthesia Regional Block: Interscalene brachial plexus block  ? ?Pre-Anesthetic Checklist: , timeout performed,  Correct Patient, Correct Site, Correct Laterality,  Correct Procedure, Correct Position, site marked,  Risks and benefits discussed,  Surgical consent,  Pre-op evaluation,  At surgeon's request and post-op pain management ? ?Laterality: Right ? ?Prep: Dura Prep     ?  ?Needles:  ?Injection technique: Single-shot ? ?Needle Type: Echogenic Stimulator Needle   ? ? ?Needle Length: 5cm  ?Needle Gauge: 20  ? ? ? ?Additional Needles: ? ? ?Procedures:,,,, ultrasound used (permanent image in chart),,    ?Narrative:  ?Start time: 08/29/2021 6:48 AM ?End time: 08/29/2021 6:51 AM ?Injection made incrementally with aspirations every 5 mL. ? ?Performed by: Personally  ?Anesthesiologist: Darral Dash, DO ? ?Additional Notes: ?Patient identified. Risks/Benefits/Options discussed with patient including but not limited to bleeding, infection, nerve damage, failed block, incomplete pain control. Patient expressed understanding and wished to proceed. All questions were answered. Sterile technique was used throughout the entire procedure. Please see nursing notes for vital signs. Aspirated in 5cc intervals with injection for negative confirmation. Patient was given instructions on fall risk and not to get out of bed. All questions and concerns addressed with instructions to call with any issues or inadequate analgesia.   ?  ? ? ? ? ?

## 2021-08-29 NOTE — Transfer of Care (Signed)
Immediate Anesthesia Transfer of Care Note ? ?Patient: Katelyn Reyes ? ?Procedure(s) Performed: RIGHT SHOULDER ARTHROSCOPY WITH ROTATOR CUFF REPAIR WITH PATCH AUGMENTATION AND  BICEPS TENODESIS (Right: Shoulder) ? ?Patient Location: PACU ? ?Anesthesia Type:GA combined with regional for post-op pain ? ?Level of Consciousness: drowsy ? ?Airway & Oxygen Therapy: Patient Spontanous Breathing and Patient connected to face mask oxygen ? ?Post-op Assessment: Report given to RN and Post -op Vital signs reviewed and stable ? ?Post vital signs: Reviewed and stable ? ?Last Vitals:  ?Vitals Value Taken Time  ?BP 132/85 08/29/21 1018  ?Temp    ?Pulse 93 08/29/21 1019  ?Resp    ?SpO2 98 % 08/29/21 1019  ?Vitals shown include unvalidated device data. ? ?Last Pain:  ?Vitals:  ? 08/29/21 0631  ?TempSrc: Oral  ?PainSc: 0-No pain  ?   ? ?Patients Stated Pain Goal: 4 (08/29/21 0631) ? ?Complications: No notable events documented. ?

## 2021-08-29 NOTE — Discharge Instructions (Addendum)
? ? ? Discharge Instructions  ? ? ?Attending Surgeon: Vanetta Mulders, MD ?Office Phone Number: [phone] ? ? ?Diagnosis and Procedures:   ? ?Surgeries Performed: ?Right shoulder arthroscopic rotator cuff repair and biceps tendodesis ? ?Discharge Plan:  ? ? ?Diet: ?Resume usual diet. Begin with light or bland foods.  Drink plenty of fluids. ? ?Activity:  ?Keep sling and dressing in place until your follow up visit in Physical Therapy ?You are advised to go home directly from the hospital or surgical center. Restrict your activities. ? ?GENERAL INSTRUCTIONS: ?1.  Keep your surgical site elevated above your heart for at least 5-7 days or longer to prevent swelling. This will improve your comfort and your overall recovery following surgery.   ?  ?2. Please call Dr. Eddie Dibbles office at [phone] with questions Monday-Friday during business hours. If no one answers, please leave a message and someone should get back to the patient within 24 hours. For emergencies please call 911 or proceed to the emergency room.  ? ?3. Patient to notify surgical team if experiences any of the following: Bowel/Bladder dysfunction, uncontrolled pain, nerve/muscle weakness, incision with increased drainage or redness, nausea/vomiting and Fever greater than 101.0 F.  Be alert for signs of infection including redness, streaking, odor, fever or chills. Be alert for excessive pain or bleeding and notify your surgeon immediately. ? ?WOUND INSTRUCTIONS:   ?Leave your dressing/cast/splint in place until your post operative visit.  Keep it clean and dry. ? ?Always keep the incision clean and dry until the staples/sutures are removed. If there is no drainage from the incision you should keep it open to air. If there is drainage from the incision you must keep it covered at all times until the drainage stops ? Do not soak in a bath tub, hot tub, pool, lake or other body of water until 21 days after your surgery and your incision is completely  dry and healed.  ?If you have removable sutures (or staples) they must be removed 10-14 days (unless otherwise instructed) from the day of your surgery.  ? ? ? 1)  Elevate the extremity as much as possible. ? 2)  Keep the dressing clean and dry. ? 3)  Please call us if the dressing becomes wet or dirty. ? 4)  If you are experiencing worsening pain or worsening swelling, please call. ?  ?  ?MEDICATIONS: ?Resume all previous home medications at the previous prescribed dose and frequency unless otherwise noted ?Start taking the  pain medications on an as-needed basis as prescribed  ?Please taper down pain medication over the next week following surgery.  Ideally you should not require a refill of any narcotic pain medication.  ?Take pain medication with food to minimize nausea. ?In addition to the prescribed pain medication, you may take over-the-counter pain relievers such as Tylenol.  Do NOT take additional tylenol if your pain medication already has tylenol in it.  ?Aspirin '325mg'$  daily for four weeks. ? ?  ?  ?FOLLOWUP INSTRUCTIONS: ?1. Follow up at the Physical Therapy Clinic 3-4 days following surgery. This appointment should be scheduled unless other arrangements have been made.The Physical Therapy scheduling number is [phone] if an appointment has not already been arranged. ? ?2. Contact Dr. Eddie Dibbles office during office hours at [phone] or the practice after hours line at [phone] for non-emergencies. For medical emergencies call 911. ? ? ?Discharge Location: Home  ? ?*You had Tylenol '1000mg'$  at 6:35am* You make take another dose after 12:35pm today,  if needed. ? ? ?Post Anesthesia Home Care Instructions ? ?Activity: ?Get plenty of rest for the remainder of the day. A responsible individual must stay with you for 24 hours following the procedure.  ?For the next 24 hours, DO NOT: ?-Drive a car ?-Paediatric nurse ?-Drink alcoholic beverages ?-Take any medication unless instructed by your  physician ?-Make any legal decisions or sign important papers. ? ?Meals: ?Start with liquid foods such as gelatin or soup. Progress to regular foods as tolerated. Avoid greasy, spicy, heavy foods. If nausea and/or vomiting occur, drink only clear liquids until the nausea and/or vomiting subsides. Call your physician if vomiting continues. ? ?Special Instructions/Symptoms: ?Your throat may feel dry or sore from the anesthesia or the breathing tube placed in your throat during surgery. If this causes discomfort, gargle with warm salt water. The discomfort should disappear within 24 hours. ? ?If you had a scopolamine patch placed behind your ear for the management of post- operative nausea and/or vomiting: ? ?1. The medication in the patch is effective for 72 hours, after which it should be removed.  Wrap patch in a tissue and discard in the trash. Wash hands thoroughly with soap and water. ?2. You may remove the patch earlier than 72 hours if you experience unpleasant side effects which may include dry mouth, dizziness or visual disturbances. ?3. Avoid touching the patch. Wash your hands with soap and water after contact with the patch. ?   Regional Anesthesia Blocks ? ?1. Numbness or the inability to move the "blocked" extremity may last from 3-48 hours after placement. The length of time depends on the medication injected and your individual response to the medication. If the numbness is not going away after 48 hours, call your surgeon. ? ?2. The extremity that is blocked will need to be protected until the numbness is gone and the  Strength has returned. Because you cannot feel it, you will need to take extra care to avoid injury. Because it may be weak, you may have difficulty moving it or using it. You may not know what position it is in without looking at it while the block is in effect. ? ?3. For blocks in the legs and feet, returning to weight bearing and walking needs to be done carefully. You will need to  wait until the numbness is entirely gone and the strength has returned. You should be able to move your leg and foot normally before you try and bear weight or walk. You will need someone to be with you when you first try to ensure you do not fall and possibly risk injury. ? ?4. Bruising and tenderness at the needle site are common side effects and will resolve in a few days. ? ?5. Persistent numbness or new problems with movement should be communicated to the surgeon or the [facility] [phone] [place] [phone]).  ?

## 2021-08-29 NOTE — Interval H&P Note (Signed)
History and Physical Interval Note: ? ?08/29/2021 ?7:23 AM ? ?Katelyn Reyes  has presented today for surgery, with the diagnosis of RIGHT SHOULDER ROTATOR CUFF TEAR, BICEPS TENDINITS.  The various methods of treatment have been discussed with the patient and family. After consideration of risks, benefits and other options for treatment, the patient has consented to  Procedure(s): ?RIGHT SHOULDER ARTHROSCOPY WITH ROTATOR CUFF REPAIR WITH PATCH AUGMENTATION AND OPEN BICEPS TENODESIS (Right) as a surgical intervention.  The patient's history has been reviewed, patient examined, no change in status, stable for surgery.  I have reviewed the patient's chart and labs.  Questions were answered to the patient's satisfaction.   ? ? ?Vanetta Mulders ? ? ?

## 2021-08-29 NOTE — Progress Notes (Signed)
Assisted Dr. Jana Half with right, ultrasound guided, interscalene  block. Side rails up, monitors on throughout procedure. See vital signs in flow sheet. Tolerated Procedure well. ?

## 2021-08-29 NOTE — Anesthesia Procedure Notes (Signed)
Procedure Name: Intubation ?Date/Time: 08/29/2021 7:46 AM ?Performed by: Lavonia Dana, CRNA ?Pre-anesthesia Checklist: Patient identified, Emergency Drugs available, Suction available and Patient being monitored ?Patient Re-evaluated:Patient Re-evaluated prior to induction ?Oxygen Delivery Method: Circle system utilized ?Preoxygenation: Pre-oxygenation with 100% oxygen ?Induction Type: IV induction ?Ventilation: Mask ventilation without difficulty and Oral airway inserted - appropriate to patient size ?Laryngoscope Size: Mac and 3 ?Grade View: Grade I ?Tube type: Oral ?Tube size: 7.0 mm ?Number of attempts: 1 ?Airway Equipment and Method: Stylet, Oral airway and Bite block ?Placement Confirmation: ETT inserted through vocal cords under direct vision, positive ETCO2 and breath sounds checked- equal and bilateral ?Secured at: 22 cm ?Tube secured with: Tape ?Dental Injury: Teeth and Oropharynx as per pre-operative assessment  ? ? ? ? ?

## 2021-08-29 NOTE — Brief Op Note (Signed)
? ?  Brief Op Note ? ?Date of Surgery: ?08/29/2021 ? ?Preoperative Diagnosis: ?RIGHT SHOULDER ROTATOR CUFF TEAR, BICEPS TENDINITS ? ?Postoperative Diagnosis: ?same ? ?Procedure: ?Procedure(s): ?RIGHT SHOULDER ARTHROSCOPY WITH ROTATOR CUFF REPAIR WITH PATCH AUGMENTATION AND  BICEPS TENODESIS ? ?Implants: ?Implant Name Type Inv. Item Serial No. Manufacturer Lot No. LRB No. Used Action  ?juggerknot soft anchor    [employer]immer [id number] Right 2 Implanted  ?Healicoil Knotless PK Suture Anchor 5.0 mm    [facility] AND [employer] [id number] Right 1 Implanted  ?Healicoil Knotless PK Suture Anchor 5.0 mm    [facility] AND [employer] [id number] Right 1 Implanted  ?ANCHOR Loistine Chance - [id number] Staple ANCHOR TENDON Judy Pimple AND [employer] [id number] Right 1 Implanted  ?[facility] BONE REGENETEN - [id number] Anchor ANCHOR BONE Judy Pimple AND [employer] [id number] Right 1 Implanted  ?IMPLANT REGENETEN MEDIUM - [id number] Shoulder IMPLANT REGENETEN MEDIUM  SMITH AND [employer] [id number] Right 1 Implanted  ? ? ?Surgeons: ?Surgeon(s): ?Vanetta Mulders, MD ? ?Anesthesia: ?General ? ? ? ?Estimated Blood Loss: ?See anesthesia record ? ?Complications: ?None ? ?Condition to PACU: ?Stable ? ?Yevonne Pax, MD ?08/29/2021 ?10:13 AM ? ?

## 2021-08-29 NOTE — Op Note (Signed)
? ?Date of Surgery: 08/29/2021 ? ?INDICATIONS: Ms. Hickmon is a 61 y.o.-year-old female with traumatic right rotator cuff tear following an acute injury.  She is.  The risk and benefits of the procedure with discussed in detail and documented in the pre-operative evaluation. ? ?PREOPERATIVE DIAGNOSIS: 1.  Right full-thickness rotator cuff tear with retraction ?2.  Right biceps tendonitis  ? ?POSTOPERATIVE DIAGNOSIS: Same. ? ?PROCEDURE: 1.  Right rotator cuff arthroscopic repair with collagen patch augmentation ?2.  Right biceps tenodesis ?3.  Right shoulder extensive debridement ?4.  Right shoulder acromioplasty with subacromial decompression ? ?SURGEON: Yevonne Pax MD ? ?ASSISTANT: Doug Sou ? ?ANESTHESIA:  general plus interscalene nerve block ? ?IV FLUIDS AND URINE: See anesthesia record. ? ?ANTIBIOTICS: Ancef 2g ? ?ESTIMATED BLOOD LOSS: 15 mL. ? ?IMPLANTS:  ?Implant Name Type Inv. Item Serial No. Manufacturer Lot No. LRB No. Used Action  ?juggerknot soft anchor    [employer]immer [id number] Right 2 Implanted  ?Healicoil Knotless PK Suture Anchor 5.0 mm    [facility] AND [employer] [id number] Right 1 Implanted  ?Healicoil Knotless PK Suture Anchor 5.0 mm    [facility] AND [employer] [id number] Right 1 Implanted  ?ANCHOR Loistine Chance - [id number] Staple ANCHOR TENDON Judy Pimple AND [employer]PHE[employer] [id number] Right 1 Implanted  ?[facility] BONE REGENETEN - [id number] Anchor ANCHOR BONE Judy Pimple AND [employer]PHE[employer] ENDOS[employer] [id number] Right 1 Implanted  ?IMPLANT REGENETEN MEDIUM - [id number] Shoulder IMPLANT REGENETEN MEDIUM  SMITH AND [employer]W [employer] [id number] Right 1 Implanted  ? ? ?DRAINS: None ? ?CULTURES: None ? ?COMPLICATIONS: none ? ?DESCRIPTION OF PROCEDURE:  ?Examination under anesthesia revealed forward elevation of 160 degrees.  In abduction, there was 80 degrees of external rotation and 70 degrees of internal rotation.  With the arm at the side, there was 65 degrees of external rotation.  There is a 1+  anterior load shift and a 1+ posterior load shift.   ? ?Arthroscopic findings demonstrated: ? ?Glenoid cartilage: Grade 2 cartilage loss ?Humeral head: Grade 2 cartilage loss with a focal cartilage loss at the posterior humeral head that was grade 3 ?Labrum: Fraying at anterior,posterior, and superior border ?Biceps insertion: Fraying ?Biceps tendon: significant Fraying/tearing  ?Subscapularis insertion: Intact ?Rotator cuff: Full thickness supraspinatus rotator cuff tear  ?Subacromial space: Significant bursitis ? ?I identified the patient in the pre-operative holding area.  I marked the operative right shoulder with my initials. I reviewed the risks and benefits of the proposed surgical intervention and the patient wished to proceed.  Anesthesia was then performed with regional block.  The patient was transferred to the operative suite and placed in the beach chair position with all bony prominences padded.   ?  ?SCDs were placed on bilateral lower extremity. Appropriate antibiotics was administered within 1 hour before incision. Regional anesthesia was administered.The operative extremity was then prepped and draped in standard fashion. A time out was performed confirming the correct extremity, correct patient and correct procedure. ? ?The arthroscope was introduced in the glenohumeral joint from a posterior portal.  An anterior portal was created.  The shoulder was examined and the above findings were noted.   ? ?With an arthroscopic shaver and an ArthroCare wand, synovitis throughout the  shoulder was resected.  I specifically completed debridement with arthroscopic shaver of the anterior superior and posterior labrum. The arthroscopic shaver was used to excise torn portions of the labrum back to a stable margin.  ? ?Through visualization from intra-articular, the footprint of the rotator cuff was  debrided of soft tissues along with osteophytes that had formed.  This was done with an arthroscopic shaver.  The  footprint was abraded with the arthroscopic shaver to create a bleeding bony surface to encourage healing.  At this time the biceps was tagged using the fast pass suture passer with a fiber tape #2.  This was detached at its insertion on the labrum. ? ?The rotator cuff was then approached through the subacromial space.  Anterior, anterolateral, posterolateral, and posterior portals were used.  Bursectomy was performed with an arthroscopic shaver and ArthroCare wand.  The soft tissues on the undersurface of the acromion and clavicle were resected with the arthroscopic shaver and Arthrocarewand.  A medial release was performed and good excursion was noted of the tendon back to its footprint which would be amendable to an repair. ? ?The rotator cuff was repaired with transosseous suture bridge configuration with two medial suture anchors with sutures passed from anterior to posterior in horizontal fashion with a scorpion suture passer. A total of 6 limbs of suture were passed. The lateral row was secured with 2 helical oil knotless and then to the anchors.  The sutures from the biceps tendon were secured into the anterior row anchor with good fixation.  Given the poor quality of the tissue tendon and degree of retraction it was deemed that collagen patch augmentation would ultimately benefit her.  This was passed into the lateral subacromial portal.  2 initial staples were placed at the medial portal of the patch.  The device was then taken off its insertion handle and 3 additional staples were placed.  There is excellent purchase of the patch over the rotator cuff tendon. ? ?The shoulder was irrigated.  The arthroscopic instruments were removed.  Wounds were closed with 3-0 nylon sutures.  A sterile dressing was applied with xeroform, 4x4s, and Tegaderm. An Gar Gibbon was placed and the upper extremity was placed in a shoulder immobilizer.  The patient tolerated the procedure well and was taken to the recovery room in  stable condition. ? ? ? ? ?POSTOPERATIVE PLAN: Patient will be nonweightbearing on her right upper extremity.  She will be seen with physical therapy.  I will see her back in 2 weeks for wound check and suture removal ? ?Yevonne Pax, MD ?10:13 AM ? ? ? ?

## 2021-08-29 NOTE — Anesthesia Postprocedure Evaluation (Signed)
Anesthesia Post Note ? ?Patient: Katelyn Reyes ? ?Procedure(s) Performed: RIGHT SHOULDER ARTHROSCOPY WITH ROTATOR CUFF REPAIR WITH PATCH AUGMENTATION AND  BICEPS TENODESIS (Right: Shoulder) ? ?  ? ?Patient location during evaluation: PACU ?Anesthesia Type: Regional and General ?Level of consciousness: awake and alert ?Pain management: pain level controlled ?Vital Signs Assessment: post-procedure vital signs reviewed and stable ?Respiratory status: spontaneous breathing, nonlabored ventilation, respiratory function stable and patient connected to nasal cannula oxygen ?Cardiovascular status: blood pressure returned to baseline and stable ?Postop Assessment: no apparent nausea or vomiting ?Anesthetic complications: no ? ? ?No notable events documented. ? ?Last Vitals:  ?Vitals:  ? 08/29/21 1114 08/29/21 1136  ?BP: 105/76 122/82  ?Pulse: 82 81  ?Resp: 16 16  ?Temp:  36.4 ?C  ?SpO2: 98% 94%  ?  ?Last Pain:  ?Vitals:  ? 08/29/21 1136  ?TempSrc:   ?PainSc: 0-No pain  ? ? ?  ?  ?  ?  ?  ?  ? ?Katelyn Reyes ? ? ? ? ?

## 2021-08-30 ENCOUNTER — Other Ambulatory Visit (HOSPITAL_BASED_OUTPATIENT_CLINIC_OR_DEPARTMENT_OTHER): Payer: Self-pay

## 2021-08-30 ENCOUNTER — Telehealth: Payer: Self-pay

## 2021-08-30 ENCOUNTER — Other Ambulatory Visit: Payer: Self-pay | Admitting: Registered Nurse

## 2021-08-30 ENCOUNTER — Ambulatory Visit (INDEPENDENT_AMBULATORY_CARE_PROVIDER_SITE_OTHER): Payer: 59 | Admitting: Orthopaedic Surgery

## 2021-08-30 ENCOUNTER — Encounter (HOSPITAL_BASED_OUTPATIENT_CLINIC_OR_DEPARTMENT_OTHER): Payer: Self-pay | Admitting: Orthopaedic Surgery

## 2021-08-30 DIAGNOSIS — M75121 Complete rotator cuff tear or rupture of right shoulder, not specified as traumatic: Secondary | ICD-10-CM

## 2021-08-30 DIAGNOSIS — E8881 Metabolic syndrome: Secondary | ICD-10-CM

## 2021-08-30 MED ORDER — METHOCARBAMOL 500 MG PO TABS
500.0000 mg | ORAL_TABLET | Freq: Two times a day (BID) | ORAL | 0 refills | Status: DC
  Filled 2021-08-30: qty 30, 15d supply, fill #0

## 2021-08-30 MED ORDER — OZEMPIC (1 MG/DOSE) 4 MG/3ML ~~LOC~~ SOPN
1.0000 mg | PEN_INJECTOR | SUBCUTANEOUS | 2 refills | Status: DC
  Filled 2021-08-30: qty 3, 28d supply, fill #0
  Filled 2021-10-17 – 2021-11-14 (×2): qty 3, 28d supply, fill #1

## 2021-08-30 NOTE — Progress Notes (Signed)
? ?                            ? ? ?Post Operative Evaluation ?  ? ?Procedure/Date of Surgery: Right shoulder rotator cuff repair with biceps tenodesis August 29, 2021 ? ?Interval History:  ? ? ? ?Presents today for follow-up as she is open to get more information regarding sling usage.  Pain has been moderate but well controlled with medications ? ? ?PMH/PSH/Family History/Social History/Meds/Allergies:   ? ?Past Medical History:  ?Diagnosis Date  ? Allergic rhinitis   ? Anxiety   ? Arthritis   ? Asthma   ? related to seasonal allergies  ? Breast calcification, right 02/02/2013  ? Surgically excised 02/17/13 > path benign   ? Cancer [facility])   ? Depression   ? Family history of breast cancer 04/12/2021  ? Family history of ovarian cancer 04/12/2021  ? GERD (gastroesophageal reflux disease)   ? Hyperlipidemia   ? IUD 02/07/2010  ? removed  ? Rotator cuff tear   ? right  ? ?Past Surgical History:  ?Procedure Laterality Date  ? BREAST BIOPSY Right 02/17/2013  ? Procedure:  NEEDLE LOCALIZATION REMOVAL RIGHT BREAST CALCIFICATIONS;  Surgeon: Haywood Lasso, MD;  Location: [place];  Service: General;  Laterality: Right;  ? BREAST EXCISIONAL BIOPSY Right pt unsure  ? benign  ? BREAST RECONSTRUCTION WITH PLACEMENT OF TISSUE EXPANDER AND ALLODERM Bilateral 04/26/2021  ? Procedure: BREAST RECONSTRUCTION WITH PLACEMENT OF TISSUE EXPANDER AND ALLODERM;  Surgeon: Irene Limbo, MD;  Location: [place];  Service: Plastics;  Laterality: Bilateral;  ? BURCH PROCEDURE N/A 12/22/2013  ? Procedure: BURCH CYSTO URETHROPEXY, ANTERIOR AND POSTERIOR CULPORRHAPHY;  Surgeon: Ena Dawley, MD;  Location: [facility];  Service: Gynecology;  Laterality: N/A;  ? CERVICAL DISC ARTHROPLASTY N/A 11/12/2017  ? Procedure: ARTIFICIAL DISC REPLACEMENT CERVICAL SIX-SEVEN;  Surgeon: Ashok Pall, MD;  Location: [place];  Service: Neurosurgery;  Laterality: N/A;  ARTIFICIAL DISC REPLACEMENT CERVICAL 6- CERVICAL 7  ?  CHOLECYSTECTOMY N/A 10/09/2017  ? Procedure: LAPAROSCOPIC CHOLECYSTECTOMY WITH INTRAOPERATIVE CHOLANGIOGRAM;  Surgeon: Judeth Horn, MD;  Location: [place];  Service: General;  Laterality: N/A;  ? KNEE ARTHROSCOPY WITH MEDIAL MENISECTOMY Right 05/01/2020  ? Procedure: KNEE ARTHROSCOPY WITH PARTIAL MEDIAL MENISECTOMY,   PATELLA  CHONDROPLASTY;  Surgeon: Paralee Cancel, MD;  Location: [facility];  Service: Orthopedics;  Laterality: Right;  ? LIPOSUCTION WITH LIPOFILLING Bilateral 07/23/2021  ? Procedure: LIPOFILLING FROM ABDOMEN TO BILATERAL CHEST;  Surgeon: Irene Limbo, MD;  Location: [place];  Service: Plastics;  Laterality: Bilateral;  ? MASTECTOMY W/ SENTINEL NODE BIOPSY Right 04/26/2021  ? Procedure: RIGHT MASTECTOMY WITH RIGHT SENTINEL LYMPH NODE BIOPSY;  Surgeon: [name], MD;  Location: [place];  Service: General;  Laterality: Right;  ? MOUTH SURGERY    ? NASAL SINUS SURGERY    ? REMOVAL OF BILATERAL TISSUE EXPANDERS WITH PLACEMENT OF BILATERAL BREAST IMPLANTS Bilateral 07/23/2021  ? Procedure: REMOVAL OF BILATERAL TISSUE EXPANDERS WITH PLACEMENT OF BILATERAL BREAST SILICONE IMPLANTS;  Surgeon: Irene Limbo, MD;  Location: [place];  Service: Plastics;  Laterality: Bilateral;  ? SHOULDER ARTHROSCOPY WITH ROTATOR CUFF REPAIR AND OPEN BICEPS TENODESIS Right 08/29/2021  ? Procedure: RIGHT SHOULDER ARTHROSCOPY WITH ROTATOR CUFF REPAIR WITH PATCH AUGMENTATION AND  BICEPS TENODESIS;  Surgeon: Vanetta Mulders, MD;  Location: [place];  Service: Orthopedics;  Laterality: Right;  ?  TOTAL MASTECTOMY Left 04/26/2021  ? Procedure: LEFT TOTAL MASTECTOMY;  Surgeon: [name], MD;  Location: [place];  Service: General;  Laterality: Left;  ? ?Social History  ? ?Socioeconomic History  ? Marital status: Single  ?  Spouse name: Not on file  ? Number of children: Not on file  ? Years of education: Not on file   ? Highest education level: Not on file  ?Occupational History  ? Not on file  ?Tobacco Use  ? Smoking status: Never  ? Smokeless tobacco: Never  ?Vaping Use  ? Vaping Use: Never used  ?Substance and Sexual Activity  ? Alcohol use: Yes  ?  Comment: social  ? Drug use: No  ? Sexual activity: Yes  ?  Birth control/protection: Post-menopausal  ?Other Topics Concern  ? Not on file  ?Social History Narrative  ? Not on file  ? ?Social Determinants of Health  ? ?Financial Resource Strain: Not on file  ?Food Insecurity: Not on file  ?Transportation Needs: Not on file  ?Physical Activity: Not on file  ?Stress: Not on file  ?Social Connections: Not on file  ? ?Family History  ?Problem Relation Age of Onset  ? Fibrocystic breast disease Mother   ? Hypertension Father   ? Diabetes Father   ? Breast cancer Maternal Aunt   ?     dx 63s  ? Colon cancer Paternal Aunt   ?     x2 pat aunts, dx after 55  ? Stroke Maternal Grandmother   ? Cancer Other   ?     ovarian/GYN; MGF's mother  ? Colon cancer Other   ?     MGM's father; dx after 21  ? Coronary artery disease Neg Hx   ? ?Allergies  ?Allergen Reactions  ? Amoxicillin Rash and Other (See Comments)  ?  Has patient had a PCN reaction causing immediate rash, facial/tongue/throat swelling, SOB or lightheadedness with hypotension: No ?Has patient had a PCN reaction causing severe rash involving mucus membranes or skin necrosis: No ?Has patient had a PCN reaction that required treatment: #  #  #  YES  #  #  # - MD office ?Has patient had a PCN reaction occurring within the last 10 years: Yes ?If all of the above answers are "NO", then may proceed with Cephalosporin use. ?  ? Erythromycin Nausea Only  ? Hydrocodone-Acetaminophen Other (See Comments)  ?  Hallucinations  ? Penicillin G Rash  ? Shellfish Allergy Itching and Other (See Comments)  ?  REACTS TO SCALLOPS ?  ? ?Current Outpatient Medications  ?Medication Sig Dispense Refill  ? methocarbamol (ROBAXIN) 500 MG tablet Take 1 tablet  (500 mg total) by mouth in the morning and at bedtime. 30 tablet 0  ? albuterol (VENTOLIN HFA) 108 (90 Base) MCG/ACT inhaler INHALE 2 PUFFS INTO THE LUNGS EVERY 6 HOURS AS NEEDED FOR WHEEZING. 18 g 6  ? ALPRAZolam (XANAX) 0.5 MG tablet Take 1 tablet (0.5 mg total) by mouth 3 (three) times daily as needed. 60 tablet 3  ? anastrozole (ARIMIDEX) 1 MG tablet Take 1 tablet (1 mg total) by mouth daily. 30 tablet 12  ? cetirizine (ZYRTEC) 10 MG tablet TAKE 1 TABLET (10 MG TOTAL) BY MOUTH DAILY. 90 tablet 1  ? Cholecalciferol (VITAMIN D-3) 125 MCG (5000 UT) TABS Take 5,000 Units by mouth every evening.    ? fluticasone (FLONASE) 50 MCG/ACT nasal spray PLACE 2 SPRAYS INTO BOTH NOSTRILS DAILY. 16 g 6  ?  montelukast (SINGULAIR) 10 MG tablet TAKE 1 TABLET (10 MG TOTAL) BY MOUTH DAILY. 90 tablet 1  ? pantoprazole (PROTONIX) 40 MG tablet Take 1 tablet (40 mg total) by mouth 2 (two) times daily. 180 tablet 1  ? Semaglutide, 1 MG/DOSE, (OZEMPIC, 1 MG/DOSE,) 4 MG/3ML SOPN Inject 1 mg as directed once a week. 3 mL 2  ? sertraline (ZOLOFT) 100 MG tablet Take 1 tablet (100 mg total) by mouth daily. 90 tablet 1  ? zolpidem (AMBIEN) 10 MG tablet Take 1 tablet (10 mg total) by mouth at bedtime as needed for sleep. 30 tablet 3  ? ?No current facility-administered medications for this visit.  ? ?No results found. ? ?Review of Systems:   ?A ROS was performed including pertinent positives and negatives as documented in the HPI. ? ? ?Musculoskeletal Exam:   ? ?There were no vitals taken for this visit. ? ?Sutures are clean dry intact.  She able flex and extend at the right elbow.  Nerve block is wearing off.  She able to fire EPL as well as wrist extensors 2+ radial pulse ? ?Imaging:   ? ?None ? ?I personally reviewed and interpreted the radiographs. ? ? ?Assessment:   ?61 year old female who is 1 day status post right shoulder rotator cuff repair with biceps tenodesis.  Overall she is doing extremely well.  We have advised her on proper sling  usage.  She will follow-up at her usual 2-week visit for suture removal ? ?Plan :   ? ?-Return to clinic per scheduled 2 week follow-up ? ? ? ? ?I personally saw and evaluated the patient, and participated

## 2021-08-30 NOTE — Telephone Encounter (Signed)
Patient called stating that she is having problems with her sling and tightness in her right shoulder.  Patient had Right shoulder surgery on Thursday, 08/29/2021.  Would like to be seen today?  CB# [phone].  Please advise.  Thank you. ?

## 2021-09-03 ENCOUNTER — Other Ambulatory Visit: Payer: Self-pay

## 2021-09-03 ENCOUNTER — Ambulatory Visit (HOSPITAL_BASED_OUTPATIENT_CLINIC_OR_DEPARTMENT_OTHER): Payer: 59 | Attending: Orthopaedic Surgery | Admitting: Physical Therapy

## 2021-09-03 ENCOUNTER — Encounter (HOSPITAL_BASED_OUTPATIENT_CLINIC_OR_DEPARTMENT_OTHER): Payer: Self-pay | Admitting: Physical Therapy

## 2021-09-03 DIAGNOSIS — G8929 Other chronic pain: Secondary | ICD-10-CM | POA: Insufficient documentation

## 2021-09-03 DIAGNOSIS — R600 Localized edema: Secondary | ICD-10-CM

## 2021-09-03 DIAGNOSIS — M25611 Stiffness of right shoulder, not elsewhere classified: Secondary | ICD-10-CM | POA: Diagnosis not present

## 2021-09-03 DIAGNOSIS — M25511 Pain in right shoulder: Secondary | ICD-10-CM | POA: Diagnosis not present

## 2021-09-03 DIAGNOSIS — M6281 Muscle weakness (generalized): Secondary | ICD-10-CM

## 2021-09-03 NOTE — Therapy (Signed)
?OUTPATIENT PHYSICAL THERAPY SHOULDER EVALUATION ? ? ?Patient Name: Katelyn Reyes ?MRN: [id number] ?DOB:05-23-1961, 61 y.o., female ?Today's Date: 09/03/2021 ? ? PT End of Session - 09/03/21 1556   ? ? Visit Number 1   ? Number of Visits 25   ? Date for PT Re-Evaluation 12/02/21   ? Authorization Type [employer]   ? PT Start Time 7829   ? PT Stop Time 1630   ? PT Time Calculation (min) 35 min   ? Activity Tolerance Patient tolerated treatment well   ? Behavior During Therapy [facility] for tasks assessed/performed   ? ?  ?  ? ?  ? ? ?Past Medical History:  ?Diagnosis Date  ? Allergic rhinitis   ? Anxiety   ? Arthritis   ? Asthma   ? related to seasonal allergies  ? Breast calcification, right 02/02/2013  ? Surgically excised 02/17/13 > path benign   ? Cancer [facility])   ? Depression   ? Family history of breast cancer 04/12/2021  ? Family history of ovarian cancer 04/12/2021  ? GERD (gastroesophageal reflux disease)   ? Hyperlipidemia   ? IUD 02/07/2010  ? removed  ? Rotator cuff tear   ? right  ? ?Past Surgical History:  ?Procedure Laterality Date  ? BREAST BIOPSY Right 02/17/2013  ? Procedure:  NEEDLE LOCALIZATION REMOVAL RIGHT BREAST CALCIFICATIONS;  Surgeon: Haywood Lasso, MD;  Location: [place];  Service: General;  Laterality: Right;  ? BREAST EXCISIONAL BIOPSY Right pt unsure  ? benign  ? BREAST RECONSTRUCTION WITH PLACEMENT OF TISSUE EXPANDER AND ALLODERM Bilateral 04/26/2021  ? Procedure: BREAST RECONSTRUCTION WITH PLACEMENT OF TISSUE EXPANDER AND ALLODERM;  Surgeon: Irene Limbo, MD;  Location: [place];  Service: Plastics;  Laterality: Bilateral;  ? BURCH PROCEDURE N/A 12/22/2013  ? Procedure: BURCH CYSTO URETHROPEXY, ANTERIOR AND POSTERIOR CULPORRHAPHY;  Surgeon: Ena Dawley, MD;  Location: [place] [facility];  Service: Gynecology;  Laterality: N/A;  ? CERVICAL DISC ARTHROPLASTY N/A 11/12/2017  ? Procedure: ARTIFICIAL DISC REPLACEMENT CERVICAL SIX-SEVEN;  Surgeon: Ashok Pall, MD;  Location: [place];  Service: Neurosurgery;  Laterality: N/A;  ARTIFICIAL DISC REPLACEMENT CERVICAL 6- CERVICAL 7  ? CHOLECYSTECTOMY N/A 10/09/2017  ? Procedure: LAPAROSCOPIC CHOLECYSTECTOMY WITH INTRAOPERATIVE CHOLANGIOGRAM;  Surgeon: Judeth Horn, MD;  Location: [place];  Service: General;  Laterality: N/A;  ? KNEE ARTHROSCOPY WITH MEDIAL MENISECTOMY Right 05/01/2020  ? Procedure: KNEE ARTHROSCOPY WITH PARTIAL MEDIAL MENISECTOMY,   PATELLA  CHONDROPLASTY;  Surgeon: Paralee Cancel, MD;  Location: [facility];  Service: Orthopedics;  Laterality: Right;  ? LIPOSUCTION WITH LIPOFILLING Bilateral 07/23/2021  ? Procedure: LIPOFILLING FROM ABDOMEN TO BILATERAL CHEST;  Surgeon: Irene Limbo, MD;  Location: [place];  Service: Plastics;  Laterality: Bilateral;  ? MASTECTOMY W/ SENTINEL NODE BIOPSY Right 04/26/2021  ? Procedure: RIGHT MASTECTOMY WITH RIGHT SENTINEL LYMPH NODE BIOPSY;  Surgeon: [name], MD;  Location: [place];  Service: General;  Laterality: Right;  ? MOUTH SURGERY    ? NASAL SINUS SURGERY    ? REMOVAL OF BILATERAL TISSUE EXPANDERS WITH PLACEMENT OF BILATERAL BREAST IMPLANTS Bilateral 07/23/2021  ? Procedure: REMOVAL OF BILATERAL TISSUE EXPANDERS WITH PLACEMENT OF BILATERAL BREAST SILICONE IMPLANTS;  Surgeon: Irene Limbo, MD;  Location: [place];  Service: Plastics;  Laterality: Bilateral;  ? SHOULDER ARTHROSCOPY WITH ROTATOR CUFF REPAIR AND OPEN BICEPS TENODESIS Right 08/29/2021  ? Procedure: RIGHT SHOULDER ARTHROSCOPY WITH ROTATOR CUFF REPAIR WITH PATCH  AUGMENTATION AND  BICEPS TENODESIS;  Surgeon: Vanetta Mulders, MD;  Location: [place];  Service: Orthopedics;  Laterality: Right;  ? TOTAL MASTECTOMY Left 04/26/2021  ? Procedure: LEFT TOTAL MASTECTOMY;  Surgeon: [name], MD;  Location: [place];  Service: General;  Laterality: Left;  ? ?Patient Active Problem List  ? Diagnosis  Date Noted  ? Nontraumatic complete tear of right rotator cuff   ? Biceps tendinitis of right upper extremity   ? Breast cancer, right ([place]) 04/26/2021  ? Family history of breast cancer 04/12/2021  ? Malignant neoplasm of upper outer quadrant of female breast ([place]) 04/12/2021  ? Family history of colon cancer 04/12/2021  ? Family history of ovarian cancer 04/12/2021  ? Metabolic syndrome 83/15/1761  ? Diverticular disease of colon 07/17/2020  ? Family history of malignant neoplasm of gastrointestinal tract 07/17/2020  ? Fatty liver 07/17/2020  ? Nonalcoholic steatohepatitis ([facility]) 07/17/2020  ? Personal history of colonic polyps 07/17/2020  ? Acute medial meniscal tear, right, subsequent encounter 05/01/2020  ? ETD (Eustachian tube dysfunction), bilateral 12/27/2019  ? Obesity (BMI 30-39.9) 07/12/2018  ? Cervical spondylosis with radiculopathy 11/12/2017  ? Vitamin D deficiency 07/07/2017  ? Urinary incontinence in female 12/22/2013  ? Asthma, mild intermittent 03/22/2013  ? Vaginal itching 06/01/2012  ? Thyromegaly 03/18/2012  ? General medical examination 03/14/2011  ? CHICKENPOX, HX OF 02/15/2010  ? Hyperlipidemia 02/14/2009  ? DEPRESSION 02/14/2009  ? ALLERGIC RHINITIS 02/14/2009  ? ? ?PCP: Midge Minium, MD ? ?REFERRING PROVIDER: Vanetta Mulders, MD ? ?REFERRING DIAG:  ?M75.121 (ICD-10-CM) - Complete rotator cuff tear or rupture of right shoulder, not specified as traumatic  ?Y07.371 (ICD-10-CM) - Biceps tendonosis of right shoulder  ? ? ?THERAPY DIAG:  ?Stiffness of right shoulder, not elsewhere classified ? ?Acute pain of right shoulder ? ?Muscle weakness (generalized) ? ?Localized edema ? ? ?ONSET DATE: 08/29/21 Surgery ? ? ?PROCEDURE: 1.  Right rotator cuff arthroscopic repair with collagen patch augmentation ?2.  Right biceps tenodesis ?3.  Right shoulder extensive debridement ?4.  Right shoulder acromioplasty with subacromial decompression ? ?SUBJECTIVE:                                                                                                                                                                                      ? ?SUBJECTIVE STATEMENT: ?Pt states she does not quite not know how she injured it. 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 She was going through lymphedema rehab for her breast cancer and noticed she had painful R shoulder with popping and clicking. Had MRI found out about the RC tear. ? ?Pt states  that since the surgery the pain was bad directly after surgery. Pt states that she has been icing continuously- 4-6 hours a day. Pt is sleeping with the sling on and in her recliner. Pt has not noticed signs of infection or drainage under the bandages. Pt denies NT. ? ?PERTINENT HISTORY: ?Breast cancer, double mastectomy ? ?PAIN:  ?Are you having pain? Yes: NPRS scale: 3/10 ?Pain location: R shoulder ?Pain description: surgical pain ?Aggravating factors: movement ?Relieving factors: icing, rest, meds ? ?PRECAUTIONS: Shoulder ? ?WEIGHT BEARING RESTRICTIONS Yes, NWB, RC repair protocol ? ?FALLS:  ?Has patient fallen in last 6 months? No Number of falls: 0 ? ?LIVING ENVIRONMENT: ?Lives with: lives with their family ?Lives in: House/apartment ?Stairs: Yes;  ?Has following equipment at home: None ? 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 ?PATIENT GOALS : return to work, be able to travel again ? ?OBJECTIVE:  ? ?DIAGNOSTIC FINDINGS:  ?IMPRESSION: ?1. Complete tear of the supraspinatus tendon with 4 cm of ?retraction. ?2. Severe tendinosis of the infraspinatus tendon with an ?interstitial tear. ?3. Mild tendinosis of the intra-articular portion of the long head ?of the biceps tendon. ? ?PATIENT SURVEYS:  ?FOTO unavailable- may take later ? ?COGNITION: ? Overall cognitive status: Within functional limits for tasks assessed ?    ?SENSATION: ?WFL ? ?POSTURE: ?Rounded/ fwd shoulders as expected in sling.  ? ?UPPER EXTREMITY ROM:  ? ?Passive ROM Right ?09/03/2021 Left ?09/03/2021   ?Shoulder flexion 30 WFL  ?Shoulder extension    ?Shoulder abduction 50 WFL  ?Shoulder adduction    ?Shoulder internal rotation To belly WFL  ?Shoulder external rotation 0 WFL  ?(Blank rows = not te

## 2021-09-04 NOTE — Telephone Encounter (Signed)
Pts son is checking fmla  ?

## 2021-09-09 ENCOUNTER — Other Ambulatory Visit (HOSPITAL_COMMUNITY): Payer: Self-pay

## 2021-09-09 ENCOUNTER — Telehealth: Payer: Self-pay | Admitting: Orthopaedic Surgery

## 2021-09-09 NOTE — Telephone Encounter (Signed)
Pt submitted medical release form, FMLA forms, and $25.00 cash payment to [employer]. Accepted 09/09/21 ?

## 2021-09-10 ENCOUNTER — Encounter (HOSPITAL_BASED_OUTPATIENT_CLINIC_OR_DEPARTMENT_OTHER): Payer: Self-pay | Admitting: Physical Therapy

## 2021-09-10 ENCOUNTER — Other Ambulatory Visit: Payer: Self-pay

## 2021-09-10 ENCOUNTER — Ambulatory Visit (HOSPITAL_BASED_OUTPATIENT_CLINIC_OR_DEPARTMENT_OTHER): Payer: 59 | Admitting: Physical Therapy

## 2021-09-10 DIAGNOSIS — G8929 Other chronic pain: Secondary | ICD-10-CM | POA: Diagnosis not present

## 2021-09-10 DIAGNOSIS — R600 Localized edema: Secondary | ICD-10-CM | POA: Diagnosis not present

## 2021-09-10 DIAGNOSIS — M6281 Muscle weakness (generalized): Secondary | ICD-10-CM

## 2021-09-10 DIAGNOSIS — M25611 Stiffness of right shoulder, not elsewhere classified: Secondary | ICD-10-CM

## 2021-09-10 DIAGNOSIS — M25511 Pain in right shoulder: Secondary | ICD-10-CM

## 2021-09-10 NOTE — Therapy (Signed)
?OUTPATIENT PHYSICAL THERAPY SHOULDER TREATMENT ? ? ?Patient Name: Katelyn Reyes ?MRN: [id number] ?DOB:27-Oct-1960, 61 y.o., female ?Today's Date: 09/10/2021 ? ? PT End of Session - 09/10/21 1637   ? ? Visit Number 2   ? Number of Visits 25   ? Date for PT Re-Evaluation 12/02/21   ? Authorization Type [employer]   ? PT Start Time 1600   ? PT Stop Time 1630   ? PT Time Calculation (min) 30 min   ? Activity Tolerance Patient tolerated treatment well   ? Behavior During Therapy [facility] for tasks assessed/performed   ? ?  ?  ? ?  ? ? ? ?Past Medical History:  ?Diagnosis Date  ? Allergic rhinitis   ? Anxiety   ? Arthritis   ? Asthma   ? related to seasonal allergies  ? Breast calcification, right 02/02/2013  ? Surgically excised 02/17/13 > path benign   ? Cancer [facility])   ? Depression   ? Family history of breast cancer 04/12/2021  ? Family history of ovarian cancer 04/12/2021  ? GERD (gastroesophageal reflux disease)   ? Hyperlipidemia   ? IUD 02/07/2010  ? removed  ? Rotator cuff tear   ? right  ? ?Past Surgical History:  ?Procedure Laterality Date  ? BREAST BIOPSY Right 02/17/2013  ? Procedure:  NEEDLE LOCALIZATION REMOVAL RIGHT BREAST CALCIFICATIONS;  Surgeon: Haywood Lasso, MD;  Location: [place];  Service: General;  Laterality: Right;  ? BREAST EXCISIONAL BIOPSY Right pt unsure  ? benign  ? BREAST RECONSTRUCTION WITH PLACEMENT OF TISSUE EXPANDER AND ALLODERM Bilateral 04/26/2021  ? Procedure: BREAST RECONSTRUCTION WITH PLACEMENT OF TISSUE EXPANDER AND ALLODERM;  Surgeon: Irene Limbo, MD;  Location: [place];  Service: Plastics;  Laterality: Bilateral;  ? BURCH PROCEDURE N/A 12/22/2013  ? Procedure: BURCH CYSTO URETHROPEXY, ANTERIOR AND POSTERIOR CULPORRHAPHY;  Surgeon: Ena Dawley, MD;  Location: [facility];  Service: Gynecology;  Laterality: N/A;  ? CERVICAL DISC ARTHROPLASTY N/A 11/12/2017  ? Procedure: ARTIFICIAL DISC REPLACEMENT CERVICAL SIX-SEVEN;  Surgeon: Ashok Pall, MD;  Location: [place];  Service: Neurosurgery;  Laterality: N/A;  ARTIFICIAL DISC REPLACEMENT CERVICAL 6- CERVICAL 7  ? CHOLECYSTECTOMY N/A 10/09/2017  ? Procedure: LAPAROSCOPIC CHOLECYSTECTOMY WITH INTRAOPERATIVE CHOLANGIOGRAM;  Surgeon: Judeth Horn, MD;  Location: [place];  Service: General;  Laterality: N/A;  ? KNEE ARTHROSCOPY WITH MEDIAL MENISECTOMY Right 05/01/2020  ? Procedure: KNEE ARTHROSCOPY WITH PARTIAL MEDIAL MENISECTOMY,   PATELLA  CHONDROPLASTY;  Surgeon: Paralee Cancel, MD;  Location: [facility];  Service: Orthopedics;  Laterality: Right;  ? LIPOSUCTION WITH LIPOFILLING Bilateral 07/23/2021  ? Procedure: LIPOFILLING FROM ABDOMEN TO BILATERAL CHEST;  Surgeon: Irene Limbo, MD;  Location: [place];  Service: Plastics;  Laterality: Bilateral;  ? MASTECTOMY W/ SENTINEL NODE BIOPSY Right 04/26/2021  ? Procedure: RIGHT MASTECTOMY WITH RIGHT SENTINEL LYMPH NODE BIOPSY;  Surgeon: [name], MD;  Location: [place];  Service: General;  Laterality: Right;  ? MOUTH SURGERY    ? NASAL SINUS SURGERY    ? REMOVAL OF BILATERAL TISSUE EXPANDERS WITH PLACEMENT OF BILATERAL BREAST IMPLANTS Bilateral 07/23/2021  ? Procedure: REMOVAL OF BILATERAL TISSUE EXPANDERS WITH PLACEMENT OF BILATERAL BREAST SILICONE IMPLANTS;  Surgeon: Irene Limbo, MD;  Location: [place];  Service: Plastics;  Laterality: Bilateral;  ? SHOULDER ARTHROSCOPY WITH ROTATOR CUFF REPAIR AND OPEN BICEPS TENODESIS Right 08/29/2021  ? Procedure: RIGHT SHOULDER ARTHROSCOPY WITH ROTATOR CUFF REPAIR WITH  PATCH AUGMENTATION AND  BICEPS TENODESIS;  Surgeon: Vanetta Mulders, MD;  Location: [place];  Service: Orthopedics;  Laterality: Right;  ? TOTAL MASTECTOMY Left 04/26/2021  ? Procedure: LEFT TOTAL MASTECTOMY;  Surgeon: [name], MD;  Location: [place];  Service: General;  Laterality: Left;  ? ?Patient Active Problem List  ? Diagnosis  Date Noted  ? Nontraumatic complete tear of right rotator cuff   ? Biceps tendinitis of right upper extremity   ? Breast cancer, right ([place]) 04/26/2021  ? Family history of breast cancer 04/12/2021  ? Malignant neoplasm of upper outer quadrant of female breast ([place]) 04/12/2021  ? Family history of colon cancer 04/12/2021  ? Family history of ovarian cancer 04/12/2021  ? Metabolic syndrome 94/76/5465  ? Diverticular disease of colon 07/17/2020  ? Family history of malignant neoplasm of gastrointestinal tract 07/17/2020  ? Fatty liver 07/17/2020  ? Nonalcoholic steatohepatitis (NASH) 07/17/2020  ? Personal history of colonic polyps 07/17/2020  ? Acute medial meniscal tear, right, subsequent encounter 05/01/2020  ? ETD (Eustachian tube dysfunction), bilateral 12/27/2019  ? Obesity (BMI 30-39.9) 07/12/2018  ? Cervical spondylosis with radiculopathy 11/12/2017  ? Vitamin D deficiency 07/07/2017  ? Urinary incontinence in female 12/22/2013  ? Asthma, mild intermittent 03/22/2013  ? Vaginal itching 06/01/2012  ? Thyromegaly 03/18/2012  ? General medical examination 03/14/2011  ? CHICKENPOX, HX OF 02/15/2010  ? Hyperlipidemia 02/14/2009  ? DEPRESSION 02/14/2009  ? ALLERGIC RHINITIS 02/14/2009  ? ? ?PCP: Midge Minium, MD ? ?REFERRING PROVIDER: Midge Minium, MD ? ?REFERRING DIAG:  ?M75.121 (ICD-10-CM) - Complete rotator cuff tear or rupture of right shoulder, not specified as traumatic  ?K35.465 (ICD-10-CM) - Biceps tendonosis of right shoulder  ? ? ?THERAPY DIAG:  ?Stiffness of right shoulder, not elsewhere classified ? ?Acute pain of right shoulder ? ?Muscle weakness (generalized) ? ?Localized edema ? ? ?ONSET DATE: 08/29/21 Surgery ? ? ?PROCEDURE: 1.  Right rotator cuff arthroscopic repair with collagen patch augmentation ?2.  Right biceps tenodesis ?3.  Right shoulder extensive debridement ?4.  Right shoulder acromioplasty with subacromial decompression ? ?SUBJECTIVE:                                                                                                                                                                                      ? ?SUBJECTIVE STATEMENT: ?Pt states she has had increased pain with being up more and having the arm move more. She has been compliant with the sling and still sleeping on the couch. She has been doing HEP 3x a day but has not been icing.  ? ?PERTINENT HISTORY: ?Breast cancer, double mastectomy ? ?  PAIN:  ?Are you having pain? Yes: NPRS scale: 5/10 up to 6/10 ?Pain location: R shoulder, subscapular area ?Pain description: surgical pain ?Aggravating factors: movement ?Relieving factors: icing, rest, meds ? ?PRECAUTIONS: Shoulder ? ?WEIGHT BEARING RESTRICTIONS Yes, NWB, RC repair protocol ? 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 ?PATIENT GOALS : return to work, be able to travel again ? ?OBJECTIVE:  ? ?DIAGNOSTIC FINDINGS:  ?IMPRESSION: ?1. Complete tear of the supraspinatus tendon with 4 cm of ?retraction. ?2. Severe tendinosis of the infraspinatus tendon with an ?interstitial tear. ?3. Mild tendinosis of the intra-articular portion of the long head ?of the biceps tendon. ? ? ?TODAY'S TREATMENT:  ? ?Bandage change- surgical bandages removed, replaced with fresh gauze and Tegaderm ?Mild puffiness noted around incision sites- clean,dry and without drainage ? ?PROM to tolerance ? ?Exercises ?Seated Scapular Retraction - 2-3 x daily - 7 x weekly - 2 sets - 10 reps ?Circular Shoulder Pendulum with Table Support - 2-3 x daily - 7 x weekly - 1 sets - 20 reps ?Flexion-Extension Shoulder Pendulum with Table Support - 2-3 x daily - 7 x weekly - 1 sets - 20 reps ?Horizontal Shoulder Pendulum with Table Support - 2-3 x daily - 7 x weekly - 1 sets - 20 reps ?Forearm Strengthening with Ball Squeeze - 5-6 x daily - 7 x weekly - 3 sets - 10 reps ?Seated Upper Trapezius Stretch - 2 x daily - 7 x weekly - 1 sets - 3 reps - 30 hold ? ? ? ?PATIENT EDUCATION: ?Education details:  precautions/protocol, edema management, joint protection, exercise progression, muscle firing,  HEP,  ? ? ?Person educated: Patient ?Education method: Explanation, Demonstration, Tactile cues, Verbal cues,

## 2021-09-12 ENCOUNTER — Ambulatory Visit (INDEPENDENT_AMBULATORY_CARE_PROVIDER_SITE_OTHER): Payer: 59 | Admitting: Orthopaedic Surgery

## 2021-09-12 ENCOUNTER — Other Ambulatory Visit (HOSPITAL_BASED_OUTPATIENT_CLINIC_OR_DEPARTMENT_OTHER): Payer: Self-pay

## 2021-09-12 DIAGNOSIS — M75121 Complete rotator cuff tear or rupture of right shoulder, not specified as traumatic: Secondary | ICD-10-CM

## 2021-09-12 MED ORDER — OXYCODONE HCL 5 MG PO TABS
5.0000 mg | ORAL_TABLET | ORAL | 0 refills | Status: DC | PRN
  Filled 2021-09-12: qty 20, 4d supply, fill #0

## 2021-09-12 NOTE — Progress Notes (Signed)
? ?                            ? ? ?Post Operative Evaluation ?  ? ?Procedure/Date of Surgery: Right shoulder rotator cuff repair with biceps tenodesis August 29, 2021 ? ?Interval History:  ? ? ? ?Presents today for 2-week follow-up.  She does have some soreness and pain at night although this is slowly improving.  She has been working on passive range of motion of physical therapy with [employer].  She is compliant with aspirin usage she has been compliant with sling usage ? ?PMH/PSH/Family History/Social History/Meds/Allergies:   ? ?Past Medical History:  ?Diagnosis Date  ? Allergic rhinitis   ? Anxiety   ? Arthritis   ? Asthma   ? related to seasonal allergies  ? Breast calcification, right 02/02/2013  ? Surgically excised 02/17/13 > path benign   ? Cancer [facility])   ? Depression   ? Family history of breast cancer 04/12/2021  ? Family history of ovarian cancer 04/12/2021  ? GERD (gastroesophageal reflux disease)   ? Hyperlipidemia   ? IUD 02/07/2010  ? removed  ? Rotator cuff tear   ? right  ? ?Past Surgical History:  ?Procedure Laterality Date  ? BREAST BIOPSY Right 02/17/2013  ? Procedure:  NEEDLE LOCALIZATION REMOVAL RIGHT BREAST CALCIFICATIONS;  Surgeon: Haywood Lasso, MD;  Location: [place];  Service: General;  Laterality: Right;  ? BREAST EXCISIONAL BIOPSY Right pt unsure  ? benign  ? BREAST RECONSTRUCTION WITH PLACEMENT OF TISSUE EXPANDER AND ALLODERM Bilateral 04/26/2021  ? Procedure: BREAST RECONSTRUCTION WITH PLACEMENT OF TISSUE EXPANDER AND ALLODERM;  Surgeon: Irene Limbo, MD;  Location: [place];  Service: Plastics;  Laterality: Bilateral;  ? BURCH PROCEDURE N/A 12/22/2013  ? Procedure: BURCH CYSTO URETHROPEXY, ANTERIOR AND POSTERIOR CULPORRHAPHY;  Surgeon: Ena Dawley, MD;  Location: [facility];  Service: Gynecology;  Laterality: N/A;  ? CERVICAL DISC ARTHROPLASTY N/A 11/12/2017  ? Procedure: ARTIFICIAL DISC REPLACEMENT CERVICAL SIX-SEVEN;  Surgeon: Ashok Pall, MD;   Location: [place];  Service: Neurosurgery;  Laterality: N/A;  ARTIFICIAL DISC REPLACEMENT CERVICAL 6- CERVICAL 7  ? CHOLECYSTECTOMY N/A 10/09/2017  ? Procedure: LAPAROSCOPIC CHOLECYSTECTOMY WITH INTRAOPERATIVE CHOLANGIOGRAM;  Surgeon: Judeth Horn, MD;  Location: [place];  Service: General;  Laterality: N/A;  ? KNEE ARTHROSCOPY WITH MEDIAL MENISECTOMY Right 05/01/2020  ? Procedure: KNEE ARTHROSCOPY WITH PARTIAL MEDIAL MENISECTOMY,   PATELLA  CHONDROPLASTY;  Surgeon: Paralee Cancel, MD;  Location: [facility];  Service: Orthopedics;  Laterality: Right;  ? LIPOSUCTION WITH LIPOFILLING Bilateral 07/23/2021  ? Procedure: LIPOFILLING FROM ABDOMEN TO BILATERAL CHEST;  Surgeon: Irene Limbo, MD;  Location: [place];  Service: Plastics;  Laterality: Bilateral;  ? MASTECTOMY W/ SENTINEL NODE BIOPSY Right 04/26/2021  ? Procedure: RIGHT MASTECTOMY WITH RIGHT SENTINEL LYMPH NODE BIOPSY;  Surgeon: [name], MD;  Location: [place];  Service: General;  Laterality: Right;  ? MOUTH SURGERY    ? NASAL SINUS SURGERY    ? REMOVAL OF BILATERAL TISSUE EXPANDERS WITH PLACEMENT OF BILATERAL BREAST IMPLANTS Bilateral 07/23/2021  ? Procedure: REMOVAL OF BILATERAL TISSUE EXPANDERS WITH PLACEMENT OF BILATERAL BREAST SILICONE IMPLANTS;  Surgeon: Irene Limbo, MD;  Location: [place];  Service: Plastics;  Laterality: Bilateral;  ? SHOULDER ARTHROSCOPY WITH ROTATOR CUFF REPAIR AND OPEN BICEPS TENODESIS Right 08/29/2021  ? Procedure: RIGHT SHOULDER ARTHROSCOPY WITH ROTATOR CUFF REPAIR WITH PATCH  AUGMENTATION AND  BICEPS TENODESIS;  Surgeon: Vanetta Mulders, MD;  Location: [place];  Service: Orthopedics;  Laterality: Right;  ? TOTAL MASTECTOMY Left 04/26/2021  ? Procedure: LEFT TOTAL MASTECTOMY;  Surgeon: [name], MD;  Location: [place];  Service: General;  Laterality: Left;  ? ?Social History  ? ?Socioeconomic History  ? Marital  status: Single  ?  Spouse name: Not on file  ? Number of children: Not on file  ? Years of education: Not on file  ? Highest education level: Not on file  ?Occupational History  ? Not on file  ?Tobacco Use  ? Smoking status: Never  ? Smokeless tobacco: Never  ?Vaping Use  ? Vaping Use: Never used  ?Substance and Sexual Activity  ? Alcohol use: Yes  ?  Comment: social  ? Drug use: No  ? Sexual activity: Yes  ?  Birth control/protection: Post-menopausal  ?Other Topics Concern  ? Not on file  ?Social History Narrative  ? Not on file  ? ?Social Determinants of Health  ? ?Financial Resource Strain: Not on file  ?Food Insecurity: Not on file  ?Transportation Needs: Not on file  ?Physical Activity: Not on file  ?Stress: Not on file  ?Social Connections: Not on file  ? ?Family History  ?Problem Relation Age of Onset  ? Fibrocystic breast disease Mother   ? Hypertension Father   ? Diabetes Father   ? Breast cancer Maternal Aunt   ?     dx 83s  ? Colon cancer Paternal Aunt   ?     x2 pat aunts, dx after 62  ? Stroke Maternal Grandmother   ? Cancer Other   ?     ovarian/GYN; MGF's mother  ? Colon cancer Other   ?     MGM's father; dx after 77  ? Coronary artery disease Neg Hx   ? ?Allergies  ?Allergen Reactions  ? Amoxicillin Rash and Other (See Comments)  ?  Has patient had a PCN reaction causing immediate rash, facial/tongue/throat swelling, SOB or lightheadedness with hypotension: No ?Has patient had a PCN reaction causing severe rash involving mucus membranes or skin necrosis: No ?Has patient had a PCN reaction that required treatment: #  #  #  YES  #  #  # - MD office ?Has patient had a PCN reaction occurring within the last 10 years: Yes ?If all of the above answers are "NO", then may proceed with Cephalosporin use. ?  ? Erythromycin Nausea Only  ? Hydrocodone-Acetaminophen Other (See Comments)  ?  Hallucinations  ? Penicillin G Rash  ? Shellfish Allergy Itching and Other (See Comments)  ?  REACTS TO SCALLOPS ?   ? ?Current Outpatient Medications  ?Medication Sig Dispense Refill  ? albuterol (VENTOLIN HFA) 108 (90 Base) MCG/ACT inhaler INHALE 2 PUFFS INTO THE LUNGS EVERY 6 HOURS AS NEEDED FOR WHEEZING. 18 g 6  ? ALPRAZolam (XANAX) 0.5 MG tablet Take 1 tablet (0.5 mg total) by mouth 3 (three) times daily as needed. 60 tablet 3  ? anastrozole (ARIMIDEX) 1 MG tablet Take 1 tablet (1 mg total) by mouth daily. 30 tablet 12  ? cetirizine (ZYRTEC) 10 MG tablet TAKE 1 TABLET (10 MG TOTAL) BY MOUTH DAILY. 90 tablet 1  ? Cholecalciferol (VITAMIN D-3) 125 MCG (5000 UT) TABS Take 5,000 Units by mouth every evening.    ? fluticasone (FLONASE) 50 MCG/ACT nasal spray PLACE 2 SPRAYS INTO BOTH NOSTRILS DAILY. 16 g 6  ?  methocarbamol (ROBAXIN) 500 MG tablet Take 1 tablet (500 mg total) by mouth in the morning and at bedtime. 30 tablet 0  ? montelukast (SINGULAIR) 10 MG tablet TAKE 1 TABLET (10 MG TOTAL) BY MOUTH DAILY. 90 tablet 1  ? pantoprazole (PROTONIX) 40 MG tablet Take 1 tablet (40 mg total) by mouth 2 (two) times daily. 180 tablet 1  ? Semaglutide, 1 MG/DOSE, (OZEMPIC, 1 MG/DOSE,) 4 MG/3ML SOPN Inject 1 mg as directed once a week. 3 mL 2  ? sertraline (ZOLOFT) 100 MG tablet Take 1 tablet (100 mg total) by mouth daily. 90 tablet 1  ? zolpidem (AMBIEN) 10 MG tablet Take 1 tablet (10 mg total) by mouth at bedtime as needed for sleep. 30 tablet 3  ? ?No current facility-administered medications for this visit.  ? ?No results found. ? ?Review of Systems:   ?A ROS was performed including pertinent positives and negatives as documented in the HPI. ? ? ?Musculoskeletal Exam:   ? ?There were no vitals taken for this visit. ? ?Sutures are clean dry intact.  Passive forward elevation of the supine position is to 90 degrees without difficulty.  External rotation at the side is to 45 degrees without difficulty she able flex and extend at the right elbow.  Nerve block is wearing off.  She able to fire EPL as well as wrist extensors 2+ radial  pulse ? ?Imaging:   ? ?None ? ?I personally reviewed and interpreted the radiographs. ? ? ?Assessment:   ?61 year old female who is 2 weeks status post right shoulder rotator cuff repair and biceps tenodesis.  Ove

## 2021-09-17 ENCOUNTER — Encounter (HOSPITAL_BASED_OUTPATIENT_CLINIC_OR_DEPARTMENT_OTHER): Payer: Self-pay | Admitting: Physical Therapy

## 2021-09-17 ENCOUNTER — Ambulatory Visit (HOSPITAL_BASED_OUTPATIENT_CLINIC_OR_DEPARTMENT_OTHER): Payer: 59 | Attending: Orthopaedic Surgery | Admitting: Physical Therapy

## 2021-09-17 DIAGNOSIS — M25511 Pain in right shoulder: Secondary | ICD-10-CM | POA: Diagnosis not present

## 2021-09-17 DIAGNOSIS — R600 Localized edema: Secondary | ICD-10-CM | POA: Diagnosis not present

## 2021-09-17 DIAGNOSIS — M6281 Muscle weakness (generalized): Secondary | ICD-10-CM | POA: Diagnosis not present

## 2021-09-17 DIAGNOSIS — M25611 Stiffness of right shoulder, not elsewhere classified: Secondary | ICD-10-CM | POA: Insufficient documentation

## 2021-09-17 NOTE — Therapy (Signed)
?OUTPATIENT PHYSICAL THERAPY SHOULDER TREATMENT ? ? ?Patient Name: Katelyn Reyes ?MRN: [id number] ?DOB:05/04/1961, 61 y.o., female ?Today's Date: 09/17/2021 ? ? PT End of Session - 09/17/21 1559   ? ? Visit Number 3   ? Number of Visits 25   ? Date for PT Re-Evaluation 12/02/21   ? Authorization Type [employer]   ? PT Start Time 1515   ? PT Stop Time 6270   ? PT Time Calculation (min) 40 min   ? Activity Tolerance Patient tolerated treatment well   ? Behavior During Therapy [facility] for tasks assessed/performed   ? ?  ?  ? ?  ? ? ? ? ?Past Medical History:  ?Diagnosis Date  ? Allergic rhinitis   ? Anxiety   ? Arthritis   ? Asthma   ? related to seasonal allergies  ? Breast calcification, right 02/02/2013  ? Surgically excised 02/17/13 > path benign   ? Cancer [facility])   ? Depression   ? Family history of breast cancer 04/12/2021  ? Family history of ovarian cancer 04/12/2021  ? GERD (gastroesophageal reflux disease)   ? Hyperlipidemia   ? IUD 02/07/2010  ? removed  ? Rotator cuff tear   ? right  ? ?Past Surgical History:  ?Procedure Laterality Date  ? BREAST BIOPSY Right 02/17/2013  ? Procedure:  NEEDLE LOCALIZATION REMOVAL RIGHT BREAST CALCIFICATIONS;  Surgeon: Haywood Lasso, MD;  Location: [place];  Service: General;  Laterality: Right;  ? BREAST EXCISIONAL BIOPSY Right pt unsure  ? benign  ? BREAST RECONSTRUCTION WITH PLACEMENT OF TISSUE EXPANDER AND ALLODERM Bilateral 04/26/2021  ? Procedure: BREAST RECONSTRUCTION WITH PLACEMENT OF TISSUE EXPANDER AND ALLODERM;  Surgeon: Irene Limbo, MD;  Location: [place];  Service: Plastics;  Laterality: Bilateral;  ? BURCH PROCEDURE N/A 12/22/2013  ? Procedure: BURCH CYSTO URETHROPEXY, ANTERIOR AND POSTERIOR CULPORRHAPHY;  Surgeon: Ena Dawley, MD;  Location: [facility];  Service: Gynecology;  Laterality: N/A;  ? CERVICAL DISC ARTHROPLASTY N/A 11/12/2017  ? Procedure: ARTIFICIAL DISC REPLACEMENT CERVICAL SIX-SEVEN;  Surgeon: Ashok Pall, MD;  Location: [place];  Service: Neurosurgery;  Laterality: N/A;  ARTIFICIAL DISC REPLACEMENT CERVICAL 6- CERVICAL 7  ? CHOLECYSTECTOMY N/A 10/09/2017  ? Procedure: LAPAROSCOPIC CHOLECYSTECTOMY WITH INTRAOPERATIVE CHOLANGIOGRAM;  Surgeon: Judeth Horn, MD;  Location: [place];  Service: General;  Laterality: N/A;  ? KNEE ARTHROSCOPY WITH MEDIAL MENISECTOMY Right 05/01/2020  ? Procedure: KNEE ARTHROSCOPY WITH PARTIAL MEDIAL MENISECTOMY,   PATELLA  CHONDROPLASTY;  Surgeon: Paralee Cancel, MD;  Location: [facility];  Service: Orthopedics;  Laterality: Right;  ? LIPOSUCTION WITH LIPOFILLING Bilateral 07/23/2021  ? Procedure: LIPOFILLING FROM ABDOMEN TO BILATERAL CHEST;  Surgeon: Irene Limbo, MD;  Location: [place];  Service: Plastics;  Laterality: Bilateral;  ? MASTECTOMY W/ SENTINEL NODE BIOPSY Right 04/26/2021  ? Procedure: RIGHT MASTECTOMY WITH RIGHT SENTINEL LYMPH NODE BIOPSY;  Surgeon: [name], MD;  Location: [place];  Service: General;  Laterality: Right;  ? MOUTH SURGERY    ? NASAL SINUS SURGERY    ? REMOVAL OF BILATERAL TISSUE EXPANDERS WITH PLACEMENT OF BILATERAL BREAST IMPLANTS Bilateral 07/23/2021  ? Procedure: REMOVAL OF BILATERAL TISSUE EXPANDERS WITH PLACEMENT OF BILATERAL BREAST SILICONE IMPLANTS;  Surgeon: Irene Limbo, MD;  Location: [place];  Service: Plastics;  Laterality: Bilateral;  ? SHOULDER ARTHROSCOPY WITH ROTATOR CUFF REPAIR AND OPEN BICEPS TENODESIS Right 08/29/2021  ? Procedure: RIGHT SHOULDER ARTHROSCOPY WITH ROTATOR CUFF REPAIR  WITH PATCH AUGMENTATION AND  BICEPS TENODESIS;  Surgeon: Vanetta Mulders, MD;  Location: [place];  Service: Orthopedics;  Laterality: Right;  ? TOTAL MASTECTOMY Left 04/26/2021  ? Procedure: LEFT TOTAL MASTECTOMY;  Surgeon: [name], MD;  Location: [place];  Service: General;  Laterality: Left;  ? ?Patient Active Problem List  ? Diagnosis  Date Noted  ? Nontraumatic complete tear of right rotator cuff   ? Biceps tendinitis of right upper extremity   ? Breast cancer, right ([place]) 04/26/2021  ? Family history of breast cancer 04/12/2021  ? Malignant neoplasm of upper outer quadrant of female breast ([place]) 04/12/2021  ? Family history of colon cancer 04/12/2021  ? Family history of ovarian cancer 04/12/2021  ? Metabolic syndrome 96/22/2979  ? Diverticular disease of colon 07/17/2020  ? Family history of malignant neoplasm of gastrointestinal tract 07/17/2020  ? Fatty liver 07/17/2020  ? Nonalcoholic steatohepatitis (NASH) 07/17/2020  ? Personal history of colonic polyps 07/17/2020  ? Acute medial meniscal tear, right, subsequent encounter 05/01/2020  ? ETD (Eustachian tube dysfunction), bilateral 12/27/2019  ? Obesity (BMI 30-39.9) 07/12/2018  ? Cervical spondylosis with radiculopathy 11/12/2017  ? Vitamin D deficiency 07/07/2017  ? Urinary incontinence in female 12/22/2013  ? Asthma, mild intermittent 03/22/2013  ? Vaginal itching 06/01/2012  ? Thyromegaly 03/18/2012  ? General medical examination 03/14/2011  ? CHICKENPOX, HX OF 02/15/2010  ? Hyperlipidemia 02/14/2009  ? DEPRESSION 02/14/2009  ? ALLERGIC RHINITIS 02/14/2009  ? ? ?PCP: Midge Minium, MD ? ?REFERRING PROVIDER: Midge Minium, MD ? ?REFERRING DIAG:  ?M75.121 (ICD-10-CM) - Complete rotator cuff tear or rupture of right shoulder, not specified as traumatic  ?G92.119 (ICD-10-CM) - Biceps tendonosis of right shoulder  ? ? ?THERAPY DIAG:  ?Stiffness of right shoulder, not elsewhere classified ? ?Acute pain of right shoulder ? ?Muscle weakness (generalized) ? ?Localized edema ? ? ?ONSET DATE: 08/29/21 Surgery ? ? ?PROCEDURE: 1.  Right rotator cuff arthroscopic repair with collagen patch augmentation ?2.  Right biceps tenodesis ?3.  Right shoulder extensive debridement ?4.  Right shoulder acromioplasty with subacromial decompression ? ?SUBJECTIVE:                                                                                                                                                                                      ? ?SUBJECTIVE STATEMENT: ?Pt states that the HEP is going well. She states that she does have the pain when she doesn't have the sling on. She is still having issues with sleeping. Pt has had to take pain medication in order to sleep. ? ?PERTINENT HISTORY: ?Breast cancer, double mastectomy ? ?PAIN:  ?  Are you having pain? Yes: NPRS scale: 6/10  ?Pain location: R shoulder, subscapular area ?Pain description: surgical pain ?Aggravating factors: movement ?Relieving factors: icing, rest, meds ? ?PRECAUTIONS: Shoulder ? ?WEIGHT BEARING RESTRICTIONS Yes, NWB, RC repair protocol ? 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 ?PATIENT GOALS : return to work, be able to travel again ? ?OBJECTIVE:  ? ?DIAGNOSTIC FINDINGS:  ?IMPRESSION: ?1. Complete tear of the supraspinatus tendon with 4 cm of ?retraction. ?2. Severe tendinosis of the infraspinatus tendon with an ?interstitial tear. ?3. Mild tendinosis of the intra-articular portion of the long head ?of the biceps tendon. ? ? ?TODAY'S TREATMENT:  ? ?PROM to tolerance with E-stim, slow with oscillations in between movements of pain mgmt ? ?Pre-mod, 50/100 hz, AP pad placement avoiding incision sites, along deltoid 1.0V,  ? ? ?PATIENT EDUCATION: ?Education details: precautions/protocol, edema management, joint protection, exercise progression, muscle firing,  HEP,  ? ? ?Person educated: Patient ?Education method: Explanation, Demonstration, Tactile cues, Verbal cues, and Handouts ?Education comprehension: verbalized understanding, returned demonstration, verbal cues required, and tactile cues required ? ? ?HOME EXERCISE PROGRAM: ?Access Code: [email or web address] ?URL: [email or web address] ?Date: 09/03/2021 ?Prepared by: Daleen Bo ? ?ASSESSMENT: ? ?CLINICAL IMPRESSION: ?Pt with increased pain at today's session during PROM. Pt  required use of concurrent E-stim with PROM in order to be able to tolerate movement. Pt able to reach 95 deg of flexion and 75 deg of ABD by end of session with slow progressive ROM. Plan to continu

## 2021-09-24 ENCOUNTER — Ambulatory Visit (HOSPITAL_BASED_OUTPATIENT_CLINIC_OR_DEPARTMENT_OTHER): Payer: 59 | Admitting: Physical Therapy

## 2021-09-24 ENCOUNTER — Encounter (HOSPITAL_BASED_OUTPATIENT_CLINIC_OR_DEPARTMENT_OTHER): Payer: Self-pay | Admitting: Physical Therapy

## 2021-09-24 DIAGNOSIS — M6281 Muscle weakness (generalized): Secondary | ICD-10-CM

## 2021-09-24 DIAGNOSIS — M25611 Stiffness of right shoulder, not elsewhere classified: Secondary | ICD-10-CM | POA: Diagnosis not present

## 2021-09-24 DIAGNOSIS — M25511 Pain in right shoulder: Secondary | ICD-10-CM | POA: Diagnosis not present

## 2021-09-24 DIAGNOSIS — R600 Localized edema: Secondary | ICD-10-CM

## 2021-09-24 NOTE — Therapy (Signed)
?OUTPATIENT PHYSICAL THERAPY SHOULDER TREATMENT ? ? ?Patient Name: Katelyn Reyes ?MRN: [id number] ?DOB:April 15, 1961, 61 y.o., female ?Today's Date: 09/24/2021 ? ? PT End of Session - 09/24/21 1050   ? ? Visit Number 4   ? Number of Visits 25   ? Date for PT Re-Evaluation 12/02/21   ? Authorization Type [employer]   ? PT Start Time 1015   ? PT Stop Time 1045   ? PT Time Calculation (min) 30 min   ? Activity Tolerance Patient tolerated treatment well   ? Behavior During Therapy [facility] for tasks assessed/performed   ? ?  ?  ? ?  ? ? ? ? ? ?Past Medical History:  ?Diagnosis Date  ? Allergic rhinitis   ? Anxiety   ? Arthritis   ? Asthma   ? related to seasonal allergies  ? Breast calcification, right 02/02/2013  ? Surgically excised 02/17/13 > path benign   ? Cancer [facility])   ? Depression   ? Family history of breast cancer 04/12/2021  ? Family history of ovarian cancer 04/12/2021  ? GERD (gastroesophageal reflux disease)   ? Hyperlipidemia   ? IUD 02/07/2010  ? removed  ? Rotator cuff tear   ? right  ? ?Past Surgical History:  ?Procedure Laterality Date  ? BREAST BIOPSY Right 02/17/2013  ? Procedure:  NEEDLE LOCALIZATION REMOVAL RIGHT BREAST CALCIFICATIONS;  Surgeon: Haywood Lasso, MD;  Location: [place];  Service: General;  Laterality: Right;  ? BREAST EXCISIONAL BIOPSY Right pt unsure  ? benign  ? BREAST RECONSTRUCTION WITH PLACEMENT OF TISSUE EXPANDER AND ALLODERM Bilateral 04/26/2021  ? Procedure: BREAST RECONSTRUCTION WITH PLACEMENT OF TISSUE EXPANDER AND ALLODERM;  Surgeon: Irene Limbo, MD;  Location: [place];  Service: Plastics;  Laterality: Bilateral;  ? BURCH PROCEDURE N/A 12/22/2013  ? Procedure: BURCH CYSTO URETHROPEXY, ANTERIOR AND POSTERIOR CULPORRHAPHY;  Surgeon: Ena Dawley, MD;  Location: [place] [facility];  Service: Gynecology;  Laterality: N/A;  ? CERVICAL DISC ARTHROPLASTY N/A 11/12/2017  ? Procedure: ARTIFICIAL DISC REPLACEMENT CERVICAL SIX-SEVEN;  Surgeon:  Ashok Pall, MD;  Location: [place];  Service: Neurosurgery;  Laterality: N/A;  ARTIFICIAL DISC REPLACEMENT CERVICAL 6- CERVICAL 7  ? CHOLECYSTECTOMY N/A 10/09/2017  ? Procedure: LAPAROSCOPIC CHOLECYSTECTOMY WITH INTRAOPERATIVE CHOLANGIOGRAM;  Surgeon: Judeth Horn, MD;  Location: [place];  Service: General;  Laterality: N/A;  ? KNEE ARTHROSCOPY WITH MEDIAL MENISECTOMY Right 05/01/2020  ? Procedure: KNEE ARTHROSCOPY WITH PARTIAL MEDIAL MENISECTOMY,   PATELLA  CHONDROPLASTY;  Surgeon: Paralee Cancel, MD;  Location: [facility];  Service: Orthopedics;  Laterality: Right;  ? LIPOSUCTION WITH LIPOFILLING Bilateral 07/23/2021  ? Procedure: LIPOFILLING FROM ABDOMEN TO BILATERAL CHEST;  Surgeon: Irene Limbo, MD;  Location: [place];  Service: Plastics;  Laterality: Bilateral;  ? MASTECTOMY W/ SENTINEL NODE BIOPSY Right 04/26/2021  ? Procedure: RIGHT MASTECTOMY WITH RIGHT SENTINEL LYMPH NODE BIOPSY;  Surgeon: [name], MD;  Location: [place];  Service: General;  Laterality: Right;  ? MOUTH SURGERY    ? NASAL SINUS SURGERY    ? REMOVAL OF BILATERAL TISSUE EXPANDERS WITH PLACEMENT OF BILATERAL BREAST IMPLANTS Bilateral 07/23/2021  ? Procedure: REMOVAL OF BILATERAL TISSUE EXPANDERS WITH PLACEMENT OF BILATERAL BREAST SILICONE IMPLANTS;  Surgeon: Irene Limbo, MD;  Location: [place];  Service: Plastics;  Laterality: Bilateral;  ? SHOULDER ARTHROSCOPY WITH ROTATOR CUFF REPAIR AND OPEN BICEPS TENODESIS Right 08/29/2021  ? Procedure: RIGHT SHOULDER ARTHROSCOPY WITH ROTATOR CUFF  REPAIR WITH PATCH AUGMENTATION AND  BICEPS TENODESIS;  Surgeon: Vanetta Mulders, MD;  Location: [place];  Service: Orthopedics;  Laterality: Right;  ? TOTAL MASTECTOMY Left 04/26/2021  ? Procedure: LEFT TOTAL MASTECTOMY;  Surgeon: [name], MD;  Location: [place];  Service: General;  Laterality: Left;  ? ?Patient Active Problem List  ?  Diagnosis Date Noted  ? Nontraumatic complete tear of right rotator cuff   ? Biceps tendinitis of right upper extremity   ? Breast cancer, right ([place]) 04/26/2021  ? Family history of breast cancer 04/12/2021  ? Malignant neoplasm of upper outer quadrant of female breast ([place]) 04/12/2021  ? Family history of colon cancer 04/12/2021  ? Family history of ovarian cancer 04/12/2021  ? Metabolic syndrome 93/79/0240  ? Diverticular disease of colon 07/17/2020  ? Family history of malignant neoplasm of gastrointestinal tract 07/17/2020  ? Fatty liver 07/17/2020  ? Nonalcoholic steatohepatitis (NASH) 07/17/2020  ? Personal history of colonic polyps 07/17/2020  ? Acute medial meniscal tear, right, subsequent encounter 05/01/2020  ? ETD (Eustachian tube dysfunction), bilateral 12/27/2019  ? Obesity (BMI 30-39.9) 07/12/2018  ? Cervical spondylosis with radiculopathy 11/12/2017  ? Vitamin D deficiency 07/07/2017  ? Urinary incontinence in female 12/22/2013  ? Asthma, mild intermittent 03/22/2013  ? Vaginal itching 06/01/2012  ? Thyromegaly 03/18/2012  ? General medical examination 03/14/2011  ? CHICKENPOX, HX OF 02/15/2010  ? Hyperlipidemia 02/14/2009  ? DEPRESSION 02/14/2009  ? ALLERGIC RHINITIS 02/14/2009  ? ? ?PCP: Midge Minium, MD ? ?REFERRING PROVIDER: Midge Minium, MD ? ?REFERRING DIAG:  ?M75.121 (ICD-10-CM) - Complete rotator cuff tear or rupture of right shoulder, not specified as traumatic  ?X73.532 (ICD-10-CM) - Biceps tendonosis of right shoulder  ? ? ?THERAPY DIAG:  ?Stiffness of right shoulder, not elsewhere classified ? ?Acute pain of right shoulder ? ?Muscle weakness (generalized) ? ?Localized edema ? ? ?ONSET DATE: 08/29/21 Surgery ? ? ?PROCEDURE: 1.  Right rotator cuff arthroscopic repair with collagen patch augmentation ?2.  Right biceps tenodesis ?3.  Right shoulder extensive debridement ?4.  Right shoulder acromioplasty with subacromial decompression ? ?SUBJECTIVE:                                                                                                                                                                                      ? ?SUBJECTIVE STATEMENT: ?Pt states she still has pain at rest but she was not overly sore after last session. She states she is still having a lot of trouble sleeping still. She has been icing 2x/day.  ? ?PERTINENT HISTORY: ?Breast cancer, double mastectomy ? ?PAIN:  ?Are you having pain? Yes:  NPRS scale: 3/10  ?Pain location: R shoulder, subscapular area ?Pain description: surgical pain ?Aggravating factors: movement ?Relieving factors: icing, rest, meds ? ?PRECAUTIONS: Shoulder ? ?WEIGHT BEARING RESTRICTIONS Yes, NWB, RC repair protocol ? 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 ?PATIENT GOALS : return to work, be able to travel again ? ?OBJECTIVE:  ? ?DIAGNOSTIC FINDINGS:  ?IMPRESSION: ?1. Complete tear of the supraspinatus tendon with 4 cm of ?retraction. ?2. Severe tendinosis of the infraspinatus tendon with an ?interstitial tear. ?3. Mild tendinosis of the intra-articular portion of the long head ?of the biceps tendon. ? ? ?TODAY'S TREATMENT:  ? ?PROM to tolerance- no E-stim needed today ? ?Flexion 120 ?ABD 75 ?IR 50 ?ER 20  ? ?Shoulder rolls 20x each ?Wrist flexion and ext 1lb 20x each ? ?PATIENT EDUCATION: ?Education details: precautions/protocol, edema management, joint protection, exercise progression, muscle firing,  HEP,  ? ? ?Person educated: Patient ?Education method: Explanation, Demonstration, Tactile cues, Verbal cues, and Handouts ?Education comprehension: verbalized understanding, returned demonstration, verbal cues required, and tactile cues required ? ? ?HOME EXERCISE PROGRAM: ?Access Code: [email or web address] ?URL: [email or web address] ?Date: 09/03/2021 ?Prepared by: Daleen Bo ? ?ASSESSMENT: ? ?CLINICAL IMPRESSION: ?Pt with much better tolerance to PROM at today's session. Pt did not need to  utilize E-stim and was able to  tolerate much greater ranges of motion. Pt is most stiff today into IR. Plan to continue with PROM to tolerance into protocol limits. Pt is 3 weeks at this time. Pt would benefit from continued skilled therapy

## 2021-09-27 ENCOUNTER — Other Ambulatory Visit (HOSPITAL_BASED_OUTPATIENT_CLINIC_OR_DEPARTMENT_OTHER): Payer: Self-pay

## 2021-10-04 ENCOUNTER — Encounter (HOSPITAL_BASED_OUTPATIENT_CLINIC_OR_DEPARTMENT_OTHER): Payer: Self-pay | Admitting: Physical Therapy

## 2021-10-04 ENCOUNTER — Ambulatory Visit (HOSPITAL_BASED_OUTPATIENT_CLINIC_OR_DEPARTMENT_OTHER): Payer: 59 | Admitting: Physical Therapy

## 2021-10-04 DIAGNOSIS — R600 Localized edema: Secondary | ICD-10-CM | POA: Diagnosis not present

## 2021-10-04 DIAGNOSIS — M25611 Stiffness of right shoulder, not elsewhere classified: Secondary | ICD-10-CM | POA: Diagnosis not present

## 2021-10-04 DIAGNOSIS — M6281 Muscle weakness (generalized): Secondary | ICD-10-CM | POA: Diagnosis not present

## 2021-10-04 DIAGNOSIS — M25511 Pain in right shoulder: Secondary | ICD-10-CM

## 2021-10-04 NOTE — Therapy (Signed)
?OUTPATIENT PHYSICAL THERAPY SHOULDER TREATMENT ? ? ?Patient Name: Katelyn Reyes ?MRN: [id number] ?DOB:Feb 28, 1961, 61 y.o., female ?Today's Date: 10/04/2021 ? ? PT End of Session - 10/04/21 1007   ? ? Visit Number 5   ? Number of Visits 25   ? Date for PT Re-Evaluation 12/02/21   ? Authorization Type [employer]   ? PT Start Time 1015   ? PT Stop Time 5284   ? PT Time Calculation (min) 24 min   ? Activity Tolerance Patient tolerated treatment well   ? Behavior During Therapy [facility] for tasks assessed/performed   ? ?  ?  ? ?  ? ? ? ? ? ? ?Past Medical History:  ?Diagnosis Date  ? Allergic rhinitis   ? Anxiety   ? Arthritis   ? Asthma   ? related to seasonal allergies  ? Breast calcification, right 02/02/2013  ? Surgically excised 02/17/13 > path benign   ? Cancer [facility])   ? Depression   ? Family history of breast cancer 04/12/2021  ? Family history of ovarian cancer 04/12/2021  ? GERD (gastroesophageal reflux disease)   ? Hyperlipidemia   ? IUD 02/07/2010  ? removed  ? Rotator cuff tear   ? right  ? ?Past Surgical History:  ?Procedure Laterality Date  ? BREAST BIOPSY Right 02/17/2013  ? Procedure:  NEEDLE LOCALIZATION REMOVAL RIGHT BREAST CALCIFICATIONS;  Surgeon: Haywood Lasso, MD;  Location: [place];  Service: General;  Laterality: Right;  ? BREAST EXCISIONAL BIOPSY Right pt unsure  ? benign  ? BREAST RECONSTRUCTION WITH PLACEMENT OF TISSUE EXPANDER AND ALLODERM Bilateral 04/26/2021  ? Procedure: BREAST RECONSTRUCTION WITH PLACEMENT OF TISSUE EXPANDER AND ALLODERM;  Surgeon: Irene Limbo, MD;  Location: [place];  Service: Plastics;  Laterality: Bilateral;  ? BURCH PROCEDURE N/A 12/22/2013  ? Procedure: BURCH CYSTO URETHROPEXY, ANTERIOR AND POSTERIOR CULPORRHAPHY;  Surgeon: Ena Dawley, MD;  Location: [place] [facility];  Service: Gynecology;  Laterality: N/A;  ? CERVICAL DISC ARTHROPLASTY N/A 11/12/2017  ? Procedure: ARTIFICIAL DISC REPLACEMENT CERVICAL SIX-SEVEN;  Surgeon:  Ashok Pall, MD;  Location: [place];  Service: Neurosurgery;  Laterality: N/A;  ARTIFICIAL DISC REPLACEMENT CERVICAL 6- CERVICAL 7  ? CHOLECYSTECTOMY N/A 10/09/2017  ? Procedure: LAPAROSCOPIC CHOLECYSTECTOMY WITH INTRAOPERATIVE CHOLANGIOGRAM;  Surgeon: Judeth Horn, MD;  Location: [place];  Service: General;  Laterality: N/A;  ? KNEE ARTHROSCOPY WITH MEDIAL MENISECTOMY Right 05/01/2020  ? Procedure: KNEE ARTHROSCOPY WITH PARTIAL MEDIAL MENISECTOMY,   PATELLA  CHONDROPLASTY;  Surgeon: Paralee Cancel, MD;  Location: [facility];  Service: Orthopedics;  Laterality: Right;  ? LIPOSUCTION WITH LIPOFILLING Bilateral 07/23/2021  ? Procedure: LIPOFILLING FROM ABDOMEN TO BILATERAL CHEST;  Surgeon: Irene Limbo, MD;  Location: [place];  Service: Plastics;  Laterality: Bilateral;  ? MASTECTOMY W/ SENTINEL NODE BIOPSY Right 04/26/2021  ? Procedure: RIGHT MASTECTOMY WITH RIGHT SENTINEL LYMPH NODE BIOPSY;  Surgeon: [name], MD;  Location: [place];  Service: General;  Laterality: Right;  ? MOUTH SURGERY    ? NASAL SINUS SURGERY    ? REMOVAL OF BILATERAL TISSUE EXPANDERS WITH PLACEMENT OF BILATERAL BREAST IMPLANTS Bilateral 07/23/2021  ? Procedure: REMOVAL OF BILATERAL TISSUE EXPANDERS WITH PLACEMENT OF BILATERAL BREAST SILICONE IMPLANTS;  Surgeon: Irene Limbo, MD;  Location: [place];  Service: Plastics;  Laterality: Bilateral;  ? SHOULDER ARTHROSCOPY WITH ROTATOR CUFF REPAIR AND OPEN BICEPS TENODESIS Right 08/29/2021  ? Procedure: RIGHT SHOULDER ARTHROSCOPY WITH ROTATOR  CUFF REPAIR WITH PATCH AUGMENTATION AND  BICEPS TENODESIS;  Surgeon: Vanetta Mulders, MD;  Location: [place];  Service: Orthopedics;  Laterality: Right;  ? TOTAL MASTECTOMY Left 04/26/2021  ? Procedure: LEFT TOTAL MASTECTOMY;  Surgeon: [name], MD;  Location: [place];  Service: General;  Laterality: Left;  ? ?Patient Active Problem List  ?  Diagnosis Date Noted  ? Nontraumatic complete tear of right rotator cuff   ? Biceps tendinitis of right upper extremity   ? Breast cancer, right ([place]) 04/26/2021  ? Family history of breast cancer 04/12/2021  ? Malignant neoplasm of upper outer quadrant of female breast ([place]) 04/12/2021  ? Family history of colon cancer 04/12/2021  ? Family history of ovarian cancer 04/12/2021  ? Metabolic syndrome 20/80/2233  ? Diverticular disease of colon 07/17/2020  ? Family history of malignant neoplasm of gastrointestinal tract 07/17/2020  ? Fatty liver 07/17/2020  ? Nonalcoholic steatohepatitis (NASH) 07/17/2020  ? Personal history of colonic polyps 07/17/2020  ? Acute medial meniscal tear, right, subsequent encounter 05/01/2020  ? ETD (Eustachian tube dysfunction), bilateral 12/27/2019  ? Obesity (BMI 30-39.9) 07/12/2018  ? Cervical spondylosis with radiculopathy 11/12/2017  ? Vitamin D deficiency 07/07/2017  ? Urinary incontinence in female 12/22/2013  ? Asthma, mild intermittent 03/22/2013  ? Vaginal itching 06/01/2012  ? Thyromegaly 03/18/2012  ? General medical examination 03/14/2011  ? CHICKENPOX, HX OF 02/15/2010  ? Hyperlipidemia 02/14/2009  ? DEPRESSION 02/14/2009  ? ALLERGIC RHINITIS 02/14/2009  ? ? ?PCP: Midge Minium, MD ? ?REFERRING PROVIDER: Midge Minium, MD ? ?REFERRING DIAG:  ?M75.121 (ICD-10-CM) - Complete rotator cuff tear or rupture of right shoulder, not specified as traumatic  ?K12.244 (ICD-10-CM) - Biceps tendonosis of right shoulder  ? ? ?THERAPY DIAG:  ?Stiffness of right shoulder, not elsewhere classified ? ?Acute pain of right shoulder ? ?Muscle weakness (generalized) ? ?Localized edema ? ? ?ONSET DATE: 08/29/21 Surgery ? ? ?PROCEDURE: 1.  Right rotator cuff arthroscopic repair with collagen patch augmentation ?2.  Right biceps tenodesis ?3.  Right shoulder extensive debridement ?4.  Right shoulder acromioplasty with subacromial decompression ? ?SUBJECTIVE:                                                                                                                                                                                      ? ?SUBJECTIVE STATEMENT: ?Pt states she has much less pain now. She no longer has pain at rest.  ? ?PERTINENT HISTORY: ?Breast cancer, double mastectomy ? ?PAIN:  ?Are you having pain? No: NPRS scale: 0/10  ?Pain location: R shoulder, subscapular area ?Pain description: surgical pain ?Aggravating factors: movement ?Relieving  factors: icing, rest, meds ? ?PRECAUTIONS: Shoulder ? ?WEIGHT BEARING RESTRICTIONS Yes, NWB, RC repair protocol ? 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 ?PATIENT GOALS : return to work, be able to travel again ? ?OBJECTIVE:  ? ?DIAGNOSTIC FINDINGS:  ?IMPRESSION: ?1. Complete tear of the supraspinatus tendon with 4 cm of ?retraction. ?2. Severe tendinosis of the infraspinatus tendon with an ?interstitial tear. ?3. Mild tendinosis of the intra-articular portion of the long head ?of the biceps tendon. ? ? ?TODAY'S TREATMENT:  ? ?PROM to tolerance ? ?Flexion 125 ?ABD 90 ?IR 50 ?ER 20  ? ?PATIENT EDUCATION: ?Education details: precautions/protocol, edema management, joint protection, exercise progression, muscle firing,  HEP,  ? ? ?Person educated: Patient ?Education method: Explanation, Demonstration, Tactile cues, Verbal cues, and Handouts ?Education comprehension: verbalized understanding, returned demonstration, verbal cues required, and tactile cues required ? ? ?HOME EXERCISE PROGRAM: ?Access Code: [email or web address] ?URL: [email or web address] ?Date: 09/03/2021 ?Prepared by: Daleen Bo ? ?ASSESSMENT: ? ?CLINICAL IMPRESSION: ?Pt is 4 wks at this time. Pt appears to have much better managed pain and able to tolerate PROM today to near protocol limits without issue. Pt does not have same amount of stiffness as previous session. Plan to continue with PROM. Pt to see MD next week for progression to next phase of protocol. Pt would  benefit from continued skilled therapy in order to reach goals and maximize functional R UE strength and ROM for full return to PLOF. ?  ? ? ?OBJECTIVE IMPAIRMENTS decreased ROM, decreased strength, hypom

## 2021-10-08 ENCOUNTER — Encounter (HOSPITAL_BASED_OUTPATIENT_CLINIC_OR_DEPARTMENT_OTHER): Payer: Self-pay | Admitting: Physical Therapy

## 2021-10-08 ENCOUNTER — Ambulatory Visit (HOSPITAL_BASED_OUTPATIENT_CLINIC_OR_DEPARTMENT_OTHER): Payer: 59 | Admitting: Physical Therapy

## 2021-10-08 DIAGNOSIS — M25611 Stiffness of right shoulder, not elsewhere classified: Secondary | ICD-10-CM

## 2021-10-08 DIAGNOSIS — M25511 Pain in right shoulder: Secondary | ICD-10-CM

## 2021-10-08 DIAGNOSIS — M6281 Muscle weakness (generalized): Secondary | ICD-10-CM

## 2021-10-08 DIAGNOSIS — R600 Localized edema: Secondary | ICD-10-CM | POA: Diagnosis not present

## 2021-10-08 NOTE — Therapy (Signed)
?OUTPATIENT PHYSICAL THERAPY SHOULDER TREATMENT ? ? ?Patient Name: Katelyn Reyes ?MRN: [id number] ?DOB:13-May-1961, 61 y.o., female ?Today's Date: 10/08/2021 ? ? PT End of Session - 10/08/21 1012   ? ? Visit Number 6   ? Number of Visits 25   ? Date for PT Re-Evaluation 12/02/21   ? Authorization Type [employer]   ? PT Start Time 1015   ? PT Stop Time 1055   ? PT Time Calculation (min) 40 min   ? Activity Tolerance Patient tolerated treatment well   ? Behavior During Therapy [facility] for tasks assessed/performed   ? ?  ?  ? ?  ? ? ? ? ? ? ?Past Medical History:  ?Diagnosis Date  ? Allergic rhinitis   ? Anxiety   ? Arthritis   ? Asthma   ? related to seasonal allergies  ? Breast calcification, right 02/02/2013  ? Surgically excised 02/17/13 > path benign   ? Cancer [facility])   ? Depression   ? Family history of breast cancer 04/12/2021  ? Family history of ovarian cancer 04/12/2021  ? GERD (gastroesophageal reflux disease)   ? Hyperlipidemia   ? IUD 02/07/2010  ? removed  ? Rotator cuff tear   ? right  ? ?Past Surgical History:  ?Procedure Laterality Date  ? BREAST BIOPSY Right 02/17/2013  ? Procedure:  NEEDLE LOCALIZATION REMOVAL RIGHT BREAST CALCIFICATIONS;  Surgeon: Haywood Lasso, MD;  Location: [place];  Service: General;  Laterality: Right;  ? BREAST EXCISIONAL BIOPSY Right pt unsure  ? benign  ? BREAST RECONSTRUCTION WITH PLACEMENT OF TISSUE EXPANDER AND ALLODERM Bilateral 04/26/2021  ? Procedure: BREAST RECONSTRUCTION WITH PLACEMENT OF TISSUE EXPANDER AND ALLODERM;  Surgeon: Irene Limbo, MD;  Location: [place];  Service: Plastics;  Laterality: Bilateral;  ? BURCH PROCEDURE N/A 12/22/2013  ? Procedure: BURCH CYSTO URETHROPEXY, ANTERIOR AND POSTERIOR CULPORRHAPHY;  Surgeon: Ena Dawley, MD;  Location: [place] [facility];  Service: Gynecology;  Laterality: N/A;  ? CERVICAL DISC ARTHROPLASTY N/A 11/12/2017  ? Procedure: ARTIFICIAL DISC REPLACEMENT CERVICAL SIX-SEVEN;  Surgeon:  Ashok Pall, MD;  Location: [place];  Service: Neurosurgery;  Laterality: N/A;  ARTIFICIAL DISC REPLACEMENT CERVICAL 6- CERVICAL 7  ? CHOLECYSTECTOMY N/A 10/09/2017  ? Procedure: LAPAROSCOPIC CHOLECYSTECTOMY WITH INTRAOPERATIVE CHOLANGIOGRAM;  Surgeon: Judeth Horn, MD;  Location: [place];  Service: General;  Laterality: N/A;  ? KNEE ARTHROSCOPY WITH MEDIAL MENISECTOMY Right 05/01/2020  ? Procedure: KNEE ARTHROSCOPY WITH PARTIAL MEDIAL MENISECTOMY,   PATELLA  CHONDROPLASTY;  Surgeon: Paralee Cancel, MD;  Location[facility];  Service: Orthopedics;  Laterality: Right;  ? LIPOSUCTION WITH LIPOFILLING Bilateral 07/23/2021  ? Procedure: LIPOFILLING FROM ABDOMEN TO BILATERAL CHEST;  Surgeon: Irene Limbo, MD;  Location: [place];  Service: Plastics;  Laterality: Bilateral;  ? MASTECTOMY W/ SENTINEL NODE BIOPSY Right 04/26/2021  ? Procedure: RIGHT MASTECTOMY WITH RIGHT SENTINEL LYMPH NODE BIOPSY;  Surgeon: [name], MD;  Location: [place];  Service: General;  Laterality: Right;  ? MOUTH SURGERY    ? NASAL SINUS SURGERY    ? REMOVAL OF BILATERAL TISSUE EXPANDERS WITH PLACEMENT OF BILATERAL BREAST IMPLANTS Bilateral 07/23/2021  ? Procedure: REMOVAL OF BILATERAL TISSUE EXPANDERS WITH PLACEMENT OF BILATERAL BREAST SILICONE IMPLANTS;  Surgeon: Irene Limbo, MD;  Location: [place];  Service: Plastics;  Laterality: Bilateral;  ? SHOULDER ARTHROSCOPY WITH ROTATOR CUFF REPAIR AND OPEN BICEPS TENODESIS Right 08/29/2021  ? Procedure: RIGHT SHOULDER ARTHROSCOPY WITH ROTATOR  CUFF REPAIR WITH PATCH AUGMENTATION AND  BICEPS TENODESIS;  Surgeon: Vanetta Mulders, MD;  Location: [place];  Service: Orthopedics;  Laterality: Right;  ? TOTAL MASTECTOMY Left 04/26/2021  ? Procedure: LEFT TOTAL MASTECTOMY;  Surgeon: [name], MD;  Location: [place];  Service: General;  Laterality: Left;  ? ?Patient Active Problem List  ?  Diagnosis Date Noted  ? Nontraumatic complete tear of right rotator cuff   ? Biceps tendinitis of right upper extremity   ? Breast cancer, right ([place]) 04/26/2021  ? Family history of breast cancer 04/12/2021  ? Malignant neoplasm of upper outer quadrant of female breast ([place]) 04/12/2021  ? Family history of colon cancer 04/12/2021  ? Family history of ovarian cancer 04/12/2021  ? Metabolic syndrome 79/39/0300  ? Diverticular disease of colon 07/17/2020  ? Family history of malignant neoplasm of gastrointestinal tract 07/17/2020  ? Fatty liver 07/17/2020  ? Nonalcoholic steatohepatitis (NASH) 07/17/2020  ? Personal history of colonic polyps 07/17/2020  ? Acute medial meniscal tear, right, subsequent encounter 05/01/2020  ? ETD (Eustachian tube dysfunction), bilateral 12/27/2019  ? Obesity (BMI 30-39.9) 07/12/2018  ? Cervical spondylosis with radiculopathy 11/12/2017  ? Vitamin D deficiency 07/07/2017  ? Urinary incontinence in female 12/22/2013  ? Asthma, mild intermittent 03/22/2013  ? Vaginal itching 06/01/2012  ? Thyromegaly 03/18/2012  ? General medical examination 03/14/2011  ? CHICKENPOX, HX OF 02/15/2010  ? Hyperlipidemia 02/14/2009  ? DEPRESSION 02/14/2009  ? ALLERGIC RHINITIS 02/14/2009  ? ? ?PCP: Midge Minium, MD ? ?REFERRING PROVIDER: Midge Minium, MD ? ?REFERRING DIAG:  ?M75.121 (ICD-10-CM) - Complete rotator cuff tear or rupture of right shoulder, not specified as traumatic  ?P23.300 (ICD-10-CM) - Biceps tendonosis of right shoulder  ? ? ?THERAPY DIAG:  ?Stiffness of right shoulder, not elsewhere classified ? ?Acute pain of right shoulder ? ?Muscle weakness (generalized) ? ?Localized edema ? ? ?ONSET DATE: 08/29/21 Surgery ? ? ?PROCEDURE: 1.  Right rotator cuff arthroscopic repair with collagen patch augmentation ?2.  Right biceps tenodesis ?3.  Right shoulder extensive debridement ?4.  Right shoulder acromioplasty with subacromial decompression ? ?SUBJECTIVE:                                                                                                                                                                                      ? ?SUBJECTIVE STATEMENT: ?Pt states she has much less pain now. She no longer has pain at rest.  ? ?PERTINENT HISTORY: ?Breast cancer, double mastectomy ? ?PAIN:  ?Are you having pain? No: NPRS scale: 0/10  ?Pain location: R shoulder, subscapular area ?Pain description: surgical pain ?Aggravating factors: movement ?Relieving  factors: icing, rest, meds ? ?PRECAUTIONS: Shoulder ? ?WEIGHT BEARING RESTRICTIONS Yes, NWB, RC repair protocol ? 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 ?PATIENT GOALS : return to work, be able to travel again ? ?OBJECTIVE:  ? ?DIAGNOSTIC FINDINGS:  ?IMPRESSION: ?1. Complete tear of the supraspinatus tendon with 4 cm of ?retraction. ?2. Severe tendinosis of the infraspinatus tendon with an ?interstitial tear. ?3. Mild tendinosis of the intra-articular portion of the long head ?of the biceps tendon. ? ? ?TODAY'S TREATMENT:  ? ?PROM to tolerance ? ?Flexion 150 ?ABD 130 ?IR 50 ?ER 50 ? ? ? ?Exercises ?-clinician assisted protraction 10x ?- Seated Scapular Retraction  - 2-3 x daily - 7 x weekly - 2 sets - 10 reps ?- Supine Shoulder Flexion AAROM with Hands Clasped  - 2-3 x daily - 7 x weekly - 1 sets - 10 reps - 3 hold ?- Seated Shoulder Flexion Towel Slide at Table Top  - 2-3 x daily - 7 x weekly - 2 sets - 10 reps ?- Seated Shoulder Abduction Towel Slide at Table Top  - 2-3 x daily - 7 x weekly - 2 sets - 10 reps ?- Seated Shoulder External Rotation AAROM with Cane and Hand in Neutral  - 2-3 x daily - 7 x weekly - 1 sets - 10 reps ? ?PATIENT EDUCATION: ?Education details: precautions/protocol, edema management, joint protection, exercise progression, muscle firing,  HEP,  ? ? ?Person educated: Patient ?Education method: Explanation, Demonstration, Tactile cues, Verbal cues, and Handouts ?Education comprehension: verbalized  understanding, returned demonstration, verbal cues required, and tactile cues required ? ? ?HOME EXERCISE PROGRAM: ?Access Code: [email or web address] ?URL: [email or web address] ?Date: 09/03/2021 ?Prepared by: Al

## 2021-10-10 ENCOUNTER — Other Ambulatory Visit (HOSPITAL_BASED_OUTPATIENT_CLINIC_OR_DEPARTMENT_OTHER): Payer: Self-pay

## 2021-10-10 ENCOUNTER — Ambulatory Visit (INDEPENDENT_AMBULATORY_CARE_PROVIDER_SITE_OTHER): Payer: 59 | Admitting: Orthopaedic Surgery

## 2021-10-10 DIAGNOSIS — M75121 Complete rotator cuff tear or rupture of right shoulder, not specified as traumatic: Secondary | ICD-10-CM

## 2021-10-10 MED ORDER — METHYLPREDNISOLONE 4 MG PO TBPK
ORAL_TABLET | ORAL | 0 refills | Status: DC
  Filled 2021-10-10: qty 21, 6d supply, fill #0

## 2021-10-10 NOTE — Progress Notes (Signed)
? ?                            ? ? ?Post Operative Evaluation ?  ? ?Procedure/Date of Surgery: Right shoulder rotator cuff repair with biceps tenodesis August 29, 2021 ? ?Interval History:  ? ? ? ?Presents today for 6-week follow-up status post right shoulder rotator cuff repair and biceps tenodesis.  Unfortunately she did have a fall on the day prior to her clinic visit.  Since that time she has had quite significant shoulder pain.  She has had limited motion.  Today for further assessment. ? ?PMH/PSH/Family History/Social History/Meds/Allergies:   ? ?Past Medical History:  ?Diagnosis Date  ? Allergic rhinitis   ? Anxiety   ? Arthritis   ? Asthma   ? related to seasonal allergies  ? Breast calcification, right 02/02/2013  ? Surgically excised 02/17/13 > path benign   ? Cancer [facility])   ? Depression   ? Family history of breast cancer 04/12/2021  ? Family history of ovarian cancer 04/12/2021  ? GERD (gastroesophageal reflux disease)   ? Hyperlipidemia   ? IUD 02/07/2010  ? removed  ? Rotator cuff tear   ? right  ? ?Past Surgical History:  ?Procedure Laterality Date  ? BREAST BIOPSY Right 02/17/2013  ? Procedure:  NEEDLE LOCALIZATION REMOVAL RIGHT BREAST CALCIFICATIONS;  Surgeon: Haywood Lasso, MD;  Location: [place];  Service: General;  Laterality: Right;  ? BREAST EXCISIONAL BIOPSY Right pt unsure  ? benign  ? BREAST RECONSTRUCTION WITH PLACEMENT OF TISSUE EXPANDER AND ALLODERM Bilateral 04/26/2021  ? Procedure: BREAST RECONSTRUCTION WITH PLACEMENT OF TISSUE EXPANDER AND ALLODERM;  Surgeon: Irene Limbo, MD;  Location: [place];  Service: Plastics;  Laterality: Bilateral;  ? BURCH PROCEDURE N/A 12/22/2013  ? Procedure: BURCH CYSTO URETHROPEXY, ANTERIOR AND POSTERIOR CULPORRHAPHY;  Surgeon: Ena Dawley, MD;  Location: [place] [facility];  Service: Gynecology;  Laterality: N/A;  ? CERVICAL DISC ARTHROPLASTY N/A 11/12/2017  ? Procedure: ARTIFICIAL DISC REPLACEMENT CERVICAL SIX-SEVEN;   Surgeon: Ashok Pall, MD;  Location: [place];  Service: Neurosurgery;  Laterality: N/A;  ARTIFICIAL DISC REPLACEMENT CERVICAL 6- CERVICAL 7  ? CHOLECYSTECTOMY N/A 10/09/2017  ? Procedure: LAPAROSCOPIC CHOLECYSTECTOMY WITH INTRAOPERATIVE CHOLANGIOGRAM;  Surgeon: Judeth Horn, MD;  Location: [place];  Service: General;  Laterality: N/A;  ? KNEE ARTHROSCOPY WITH MEDIAL MENISECTOMY Right 05/01/2020  ? Procedure: KNEE ARTHROSCOPY WITH PARTIAL MEDIAL MENISECTOMY,   PATELLA  CHONDROPLASTY;  Surgeon: Paralee Cancel, MD;  Location: [facility];  Service: Orthopedics;  Laterality: Right;  ? LIPOSUCTION WITH LIPOFILLING Bilateral 07/23/2021  ? Procedure: LIPOFILLING FROM ABDOMEN TO BILATERAL CHEST;  Surgeon: Irene Limbo, MD;  Location: [place];  Service: Plastics;  Laterality: Bilateral;  ? MASTECTOMY W/ SENTINEL NODE BIOPSY Right 04/26/2021  ? Procedure: RIGHT MASTECTOMY WITH RIGHT SENTINEL LYMPH NODE BIOPSY;  Surgeon: [name], MD;  Location: [place];  Service: General;  Laterality: Right;  ? MOUTH SURGERY    ? NASAL SINUS SURGERY    ? REMOVAL OF BILATERAL TISSUE EXPANDERS WITH PLACEMENT OF BILATERAL BREAST IMPLANTS Bilateral 07/23/2021  ? Procedure: REMOVAL OF BILATERAL TISSUE EXPANDERS WITH PLACEMENT OF BILATERAL BREAST SILICONE IMPLANTS;  Surgeon: Irene Limbo, MD;  Location: [place];  Service: Plastics;  Laterality: Bilateral;  ? SHOULDER ARTHROSCOPY WITH ROTATOR CUFF REPAIR AND OPEN BICEPS TENODESIS Right 08/29/2021  ? Procedure: RIGHT SHOULDER ARTHROSCOPY WITH ROTATOR CUFF  REPAIR WITH PATCH AUGMENTATION AND  BICEPS TENODESIS;  Surgeon: Vanetta Mulders, MD;  Location: [place];  Service: Orthopedics;  Laterality: Right;  ? TOTAL MASTECTOMY Left 04/26/2021  ? Procedure: LEFT TOTAL MASTECTOMY;  Surgeon: [name], MD;  Location: [place];  Service: General;  Laterality: Left;  ? ?Social History   ? ?Socioeconomic History  ? Marital status: Single  ?  Spouse name: Not on file  ? Number of children: Not on file  ? Years of education: Not on file  ? Highest education level: Not on file  ?Occupational History  ? Not on file  ?Tobacco Use  ? Smoking status: Never  ? Smokeless tobacco: Never  ?Vaping Use  ? Vaping Use: Never used  ?Substance and Sexual Activity  ? Alcohol use: Yes  ?  Comment: social  ? Drug use: No  ? Sexual activity: Yes  ?  Birth control/protection: Post-menopausal  ?Other Topics Concern  ? Not on file  ?Social History Narrative  ? Not on file  ? ?Social Determinants of Health  ? ?Financial Resource Strain: Not on file  ?Food Insecurity: Not on file  ?Transportation Needs: Not on file  ?Physical Activity: Not on file  ?Stress: Not on file  ?Social Connections: Not on file  ? ?Family History  ?Problem Relation Age of Onset  ? Fibrocystic breast disease Mother   ? Hypertension Father   ? Diabetes Father   ? Breast cancer Maternal Aunt   ?     dx 50s  ? Colon cancer Paternal Aunt   ?     x2 pat aunts, dx after 49  ? Stroke Maternal Grandmother   ? Cancer Other   ?     ovarian/GYN; MGF's mother  ? Colon cancer Other   ?     MGM's father; dx after 1  ? Coronary artery disease Neg Hx   ? ?Allergies  ?Allergen Reactions  ? Amoxicillin Rash and Other (See Comments)  ?  Has patient had a PCN reaction causing immediate rash, facial/tongue/throat swelling, SOB or lightheadedness with hypotension: No ?Has patient had a PCN reaction causing severe rash involving mucus membranes or skin necrosis: No ?Has patient had a PCN reaction that required treatment: #  #  #  YES  #  #  # - MD office ?Has patient had a PCN reaction occurring within the last 10 years: Yes ?If all of the above answers are "NO", then may proceed with Cephalosporin use. ?  ? Erythromycin Nausea Only  ? Hydrocodone-Acetaminophen Other (See Comments)  ?  Hallucinations  ? Penicillin G Rash  ? Shellfish Allergy Itching and Other (See  Comments)  ?  REACTS TO SCALLOPS ?  ? ?Current Outpatient Medications  ?Medication Sig Dispense Refill  ? methylPREDNISolone (MEDROL DOSEPAK) 4 MG TBPK tablet Take per packet instructions 1 each 0  ? albuterol (VENTOLIN HFA) 108 (90 Base) MCG/ACT inhaler INHALE 2 PUFFS INTO THE LUNGS EVERY 6 HOURS AS NEEDED FOR WHEEZING. 18 g 6  ? ALPRAZolam (XANAX) 0.5 MG tablet Take 1 tablet (0.5 mg total) by mouth 3 (three) times daily as needed. 60 tablet 3  ? anastrozole (ARIMIDEX) 1 MG tablet Take 1 tablet (1 mg total) by mouth daily. 30 tablet 12  ? cetirizine (ZYRTEC) 10 MG tablet TAKE 1 TABLET (10 MG TOTAL) BY MOUTH DAILY. 90 tablet 1  ? Cholecalciferol (VITAMIN D-3) 125 MCG (5000 UT) TABS Take 5,000 Units by mouth every evening.    ?  fluticasone (FLONASE) 50 MCG/ACT nasal spray PLACE 2 SPRAYS INTO BOTH NOSTRILS DAILY. 16 g 6  ? methocarbamol (ROBAXIN) 500 MG tablet Take 1 tablet (500 mg total) by mouth in the morning and at bedtime. 30 tablet 0  ? montelukast (SINGULAIR) 10 MG tablet TAKE 1 TABLET (10 MG TOTAL) BY MOUTH DAILY. 90 tablet 1  ? oxyCODONE (OXY IR/ROXICODONE) 5 MG immediate release tablet Take 1 tablet (5 mg total) by mouth every 4 (four) hours as needed (severe pain). 20 tablet 0  ? pantoprazole (PROTONIX) 40 MG tablet Take 1 tablet (40 mg total) by mouth 2 (two) times daily. 180 tablet 1  ? Semaglutide, 1 MG/DOSE, (OZEMPIC, 1 MG/DOSE,) 4 MG/3ML SOPN Inject 1 mg as directed once a week. 3 mL 2  ? sertraline (ZOLOFT) 100 MG tablet Take 1 tablet (100 mg total) by mouth daily. 90 tablet 1  ? zolpidem (AMBIEN) 10 MG tablet Take 1 tablet (10 mg total) by mouth at bedtime as needed for sleep. 30 tablet 3  ? ?No current facility-administered medications for this visit.  ? ?No results found. ? ?Review of Systems:   ?A ROS was performed including pertinent positives and negatives as documented in the HPI. ? ? ?Musculoskeletal Exam:   ? ?There were no vitals taken for this visit. ? ?Sutures are clean dry intact.   Passive forward elevation of the supine position is to 90 degrees without difficulty.  External rotation at the side is to 45 degrees without difficulty she able flex and extend at the right elbow.  Nerve block is wearing off

## 2021-10-11 ENCOUNTER — Encounter: Payer: Self-pay | Admitting: Hematology & Oncology

## 2021-10-11 ENCOUNTER — Inpatient Hospital Stay: Payer: 59 | Admitting: Hematology & Oncology

## 2021-10-11 ENCOUNTER — Telehealth: Payer: Self-pay | Admitting: *Deleted

## 2021-10-11 ENCOUNTER — Inpatient Hospital Stay: Payer: 59 | Attending: Internal Medicine

## 2021-10-11 ENCOUNTER — Other Ambulatory Visit (HOSPITAL_COMMUNITY): Payer: Self-pay

## 2021-10-11 ENCOUNTER — Encounter: Payer: Self-pay | Admitting: *Deleted

## 2021-10-11 VITALS — BP 111/76 | HR 79 | Temp 97.9°F | Resp 18 | Ht 62.0 in | Wt 176.5 lb

## 2021-10-11 DIAGNOSIS — C50411 Malignant neoplasm of upper-outer quadrant of right female breast: Secondary | ICD-10-CM

## 2021-10-11 DIAGNOSIS — R197 Diarrhea, unspecified: Secondary | ICD-10-CM | POA: Diagnosis present

## 2021-10-11 DIAGNOSIS — R112 Nausea with vomiting, unspecified: Secondary | ICD-10-CM | POA: Diagnosis present

## 2021-10-11 DIAGNOSIS — C50911 Malignant neoplasm of unspecified site of right female breast: Secondary | ICD-10-CM | POA: Diagnosis not present

## 2021-10-11 DIAGNOSIS — Z79811 Long term (current) use of aromatase inhibitors: Secondary | ICD-10-CM | POA: Diagnosis not present

## 2021-10-11 DIAGNOSIS — Z9013 Acquired absence of bilateral breasts and nipples: Secondary | ICD-10-CM | POA: Insufficient documentation

## 2021-10-11 DIAGNOSIS — Z170 Estrogen receptor positive status [ER+]: Secondary | ICD-10-CM | POA: Diagnosis not present

## 2021-10-11 LAB — CBC WITH DIFFERENTIAL (CANCER CENTER ONLY)
Abs Immature Granulocytes: 0.03 10*3/uL (ref 0.00–0.07)
Basophils Absolute: 0 10*3/uL (ref 0.0–0.1)
Basophils Relative: 0 %
Eosinophils Absolute: 0 10*3/uL (ref 0.0–0.5)
Eosinophils Relative: 0 %
HCT: 38.7 % (ref 36.0–46.0)
Hemoglobin: 12.7 g/dL (ref 12.0–15.0)
Immature Granulocytes: 0 %
Lymphocytes Relative: 30 %
Lymphs Abs: 2.3 10*3/uL (ref 0.7–4.0)
MCH: 31.1 pg (ref 26.0–34.0)
MCHC: 32.8 g/dL (ref 30.0–36.0)
MCV: 94.6 fL (ref 80.0–100.0)
Monocytes Absolute: 0.3 10*3/uL (ref 0.1–1.0)
Monocytes Relative: 4 %
Neutro Abs: 5 10*3/uL (ref 1.7–7.7)
Neutrophils Relative %: 66 %
Platelet Count: 294 10*3/uL (ref 150–400)
RBC: 4.09 MIL/uL (ref 3.87–5.11)
RDW: 11.9 % (ref 11.5–15.5)
WBC Count: 7.6 10*3/uL (ref 4.0–10.5)
nRBC: 0 % (ref 0.0–0.2)

## 2021-10-11 LAB — CMP (CANCER CENTER ONLY)
ALT: 15 U/L (ref 0–44)
AST: 16 U/L (ref 15–41)
Albumin: 4.3 g/dL (ref 3.5–5.0)
Alkaline Phosphatase: 70 U/L (ref 38–126)
Anion gap: 7 (ref 5–15)
BUN: 18 mg/dL (ref 6–20)
CO2: 27 mmol/L (ref 22–32)
Calcium: 10.2 mg/dL (ref 8.9–10.3)
Chloride: 104 mmol/L (ref 98–111)
Creatinine: 0.95 mg/dL (ref 0.44–1.00)
GFR, Estimated: >60 mL/min (ref >60)
Glucose, Bld: 131 mg/dL — ABNORMAL HIGH (ref 70–99)
Potassium: 4.7 mmol/L (ref 3.5–5.1)
Sodium: 138 mmol/L (ref 135–145)
Total Bilirubin: 0.4 mg/dL (ref 0.3–1.2)
Total Protein: 7.7 g/dL (ref 6.5–8.1)

## 2021-10-11 LAB — LACTATE DEHYDROGENASE: LDH: 168 U/L (ref 98–192)

## 2021-10-11 MED ORDER — ANASTROZOLE 1 MG PO TABS
1.0000 mg | ORAL_TABLET | Freq: Every day | ORAL | 12 refills | Status: DC
Start: 2021-10-11 — End: 2022-10-29
  Filled 2021-10-11 – 2021-10-28 (×2): qty 90, 90d supply, fill #0
  Filled 2022-01-23: qty 90, 90d supply, fill #1
  Filled 2022-04-30: qty 90, 90d supply, fill #2
  Filled 2022-07-21: qty 90, 90d supply, fill #3

## 2021-10-11 NOTE — Addendum Note (Signed)
Addended by: Burney Gauze R on: 10/11/2021 10:32 AM ? ? Modules accepted: Orders ? ?

## 2021-10-11 NOTE — Progress Notes (Signed)
Oncology Nurse Navigator Documentation ? ? ?  10/11/2021  ? 10:00 AM  ?Oncology Nurse Navigator Flowsheets  ?Navigator Follow Up Date: 01/10/2022  ?Navigator Follow Up Reason: Follow-up Appointment  ?Navigator Location [facility]  ?Navigator Encounter Type Appt/Treatment Plan Review  ?Patient Visit Type MedOnc  ?Treatment Phase Active Tx  ?Barriers/Navigation Needs No Barriers At This Time  ?Interventions None Required  ?Acuity Level 1-No Barriers  ?Support Groups/Services Friends and Family  ?Time Spent with Patient 15  ?  ?

## 2021-10-11 NOTE — Telephone Encounter (Signed)
Per 10/11/21 los - gave upcoming appointments - confirmed ?

## 2021-10-11 NOTE — Progress Notes (Signed)
?Hematology and Oncology Follow Up Visit ? ?Katelyn Reyes ?[id number] ?1960/09/09 61 y.o. ?10/11/2021 ? ? ?Principle Diagnosis:  ?Stage IA ([id number]) infiltrating ductal carcinoma of the right breast- ER+/PR+/HER2- ? ?Current Therapy:   ?Status post bilateral mastectomies --04/26/2021 ?Arimidex 1 mg p.o. daily --start on 06/29/2021 ?    ?Interim History:  Katelyn Reyes is back for follow-up.  She did have her shoulder surgery.  This was for her right shoulder.  She comes in with a sling on.  This was 6 weeks ago.  She is doing some physical therapy. ? ?Otherwise, she seems to be doing pretty well.  She is on the Arimidex.  She has not had any problems with the Arimidex.  There is no arthralgias, outside of that with the right shoulder. ? ?She has had no issues with fever.  She has had no change in bowel or bladder habits.  She has had no rashes.  There is been no leg swelling.  There may be a little bit of lymphedema of the right arm. ? ?Overall, I would say performance status is probably ECOG 1.   . ? ? ?Medications:  ?Current Outpatient Medications:  ?  ALPRAZolam (XANAX) 0.5 MG tablet, Take 1 tablet (0.5 mg total) by mouth 3 (three) times daily as needed., Disp: 60 tablet, Rfl: 3 ?  anastrozole (ARIMIDEX) 1 MG tablet, Take 1 tablet (1 mg total) by mouth daily., Disp: 30 tablet, Rfl: 12 ?  Cholecalciferol (VITAMIN D-3) 125 MCG (5000 UT) TABS, Take 5,000 Units by mouth every evening., Disp: , Rfl:  ?  methocarbamol (ROBAXIN) 500 MG tablet, Take 1 tablet (500 mg total) by mouth in the morning and at bedtime., Disp: 30 tablet, Rfl: 0 ?  methylPREDNISolone (MEDROL DOSEPAK) 4 MG TBPK tablet, Take per packet instructions, Disp: 21 tablet, Rfl: 0 ?  oxyCODONE (OXY IR/ROXICODONE) 5 MG immediate release tablet, Take 1 tablet (5 mg total) by mouth every 4 (four) hours as needed (severe pain)., Disp: 20 tablet, Rfl: 0 ?  pantoprazole (PROTONIX) 40 MG tablet, Take 1 tablet (40 mg total) by mouth 2 (two) times daily., Disp: 180  tablet, Rfl: 1 ?  Semaglutide, 1 MG/DOSE, (OZEMPIC, 1 MG/DOSE,) 4 MG/3ML SOPN, Inject 1 mg as directed once a week., Disp: 3 mL, Rfl: 2 ?  sertraline (ZOLOFT) 100 MG tablet, Take 1 tablet (100 mg total) by mouth daily., Disp: 90 tablet, Rfl: 1 ?  zolpidem (AMBIEN) 10 MG tablet, Take 1 tablet (10 mg total) by mouth at bedtime as needed for sleep., Disp: 30 tablet, Rfl: 3 ?  albuterol (VENTOLIN HFA) 108 (90 Base) MCG/ACT inhaler, INHALE 2 PUFFS INTO THE LUNGS EVERY 6 HOURS AS NEEDED FOR WHEEZING., Disp: 18 g, Rfl: 6 ?  cetirizine (ZYRTEC) 10 MG tablet, TAKE 1 TABLET (10 MG TOTAL) BY MOUTH DAILY., Disp: 90 tablet, Rfl: 1 ?  fluticasone (FLONASE) 50 MCG/ACT nasal spray, PLACE 2 SPRAYS INTO BOTH NOSTRILS DAILY., Disp: 16 g, Rfl: 6 ?  montelukast (SINGULAIR) 10 MG tablet, TAKE 1 TABLET (10 MG TOTAL) BY MOUTH DAILY., Disp: 90 tablet, Rfl: 1 ? ?Allergies:  ?Allergies  ?Allergen Reactions  ? Amoxicillin Rash and Other (See Comments)  ?  Has patient had a PCN reaction causing immediate rash, facial/tongue/throat swelling, SOB or lightheadedness with hypotension: No ?Has patient had a PCN reaction causing severe rash involving mucus membranes or skin necrosis: No ?Has patient had a PCN reaction that required treatment: #  #  #  YES  #  #  # -  MD office ?Has patient had a PCN reaction occurring within the last 10 years: Yes ?If all of the above answers are "NO", then may proceed with Cephalosporin use. ?  ? Erythromycin Nausea Only  ? Hydrocodone-Acetaminophen Other (See Comments)  ?  Hallucinations  ? Penicillin G Rash  ? Shellfish Allergy Itching and Other (See Comments)  ?  REACTS TO SCALLOPS ?  ? ? ?Past Medical History, Surgical history, Social history, and Family History were reviewed and updated. ? ?Review of Systems: ?Review of Systems  ?Constitutional:  Positive for appetite change.  ?HENT:  Negative.    ?Eyes: Negative.   ?Respiratory: Negative.    ?Cardiovascular: Negative.   ?Gastrointestinal:  Positive for diarrhea  and vomiting.  ?Endocrine: Negative.   ?Genitourinary: Negative.    ?Musculoskeletal: Negative.   ?Skin: Negative.   ?Neurological: Negative.   ?Hematological: Negative.   ?Psychiatric/Behavioral: Negative.    ? ?Physical Exam: ? height is 5' 2"  (1.575 m) and weight is 176 lb 8 oz (80.1 kg). Her oral temperature is 97.9 ?F (36.6 ?C). Her blood pressure is 111/76 and her pulse is 79. Her respiration is 18 and oxygen saturation is 98%.  ? ?Wt Readings from Last 3 Encounters:  ?10/11/21 176 lb 8 oz (80.1 kg)  ?08/29/21 176 lb 2.4 oz (79.9 kg)  ?08/16/21 175 lb 6.4 oz (79.6 kg)  ? ? ?Physical Exam ?Vitals reviewed.  ?Constitutional:   ?   Comments: Breast exam shows status post mastectomies.  She has implants.  Everything looks to be healing nicely.  There is no erythema.  There is no exudate.  The wounds are intact.  There is no bilateral axillary adenopathy.    ?HENT:  ?   Head: Normocephalic and atraumatic.  ?Eyes:  ?   Pupils: Pupils are equal, round, and reactive to light.  ?Cardiovascular:  ?   Rate and Rhythm: Normal rate and regular rhythm.  ?   Heart sounds: Normal heart sounds.  ?Pulmonary:  ?   Effort: Pulmonary effort is normal.  ?   Breath sounds: Normal breath sounds.  ?Abdominal:  ?   General: Bowel sounds are normal.  ?   Palpations: Abdomen is soft.  ?   Comments: Abdominal exam is soft.  There is no distention.  There is some tenderness to palpation.  She has no fluid wave.  Bowel sounds are present.  There is no palpable liver or spleen tip.  ?Musculoskeletal:     ?   General: No tenderness or deformity. Normal range of motion.  ?   Cervical back: Normal range of motion.  ?   Comments: Extremities shows the right shoulder with the arthroscopic scars.  She has a sling on the right shoulder.  She obviously has decreased range of motion of the right arm.  ?Lymphadenopathy:  ?   Cervical: No cervical adenopathy.  ?Skin: ?   General: Skin is warm and dry.  ?   Findings: No erythema or rash.  ?Neurological:   ?   Mental Status: She is alert and oriented to person, place, and time.  ?Psychiatric:     ?   Behavior: Behavior normal.     ?   Thought Content: Thought content normal.     ?   Judgment: Judgment normal.  ? ? ? ?Lab Results  ?Component Value Date  ? WBC 7.6 10/11/2021  ? HGB 12.7 10/11/2021  ? HCT 38.7 10/11/2021  ? MCV 94.6 10/11/2021  ? PLT 294 10/11/2021  ? ?  Chemistry   ?   ?Component Value Date/Time  ? NA 144 08/09/2021 0813  ? K 4.5 08/09/2021 0813  ? CL 108 08/09/2021 0813  ? CO2 30 08/09/2021 0813  ? BUN 12 08/09/2021 0813  ? CREATININE 1.01 (H) 08/09/2021 0813  ? CREATININE 0.92 04/28/2017 1645  ?    ?Component Value Date/Time  ? CALCIUM 9.5 08/09/2021 0813  ? ALKPHOS 74 08/09/2021 0813  ? AST 17 08/09/2021 0813  ? ALT 18 08/09/2021 0813  ? BILITOT 0.4 08/09/2021 0813  ?  ? ? ?Impression and Plan: ?Ms. Imai is a very charming 61 year old African-American female.  She is postmenopausal. ? ?She has a very early stage ductal carcinoma of the right breast.  Again, I do suspect that with surgery alone, her chance of cure is probably 90%. ? ?We will continue the Arimidex. ? ?We will now get her back in 3 months.  By then, should be out of the sling.  I am sure that she will be taking some physical therapy. ? ?I hope that she enjoys her puppy.  I am sure that it is getting bigger. ? ? ? ?Volanda Napoleon, MD ?4/28/20238:54 AM  ?

## 2021-10-14 ENCOUNTER — Other Ambulatory Visit (HOSPITAL_BASED_OUTPATIENT_CLINIC_OR_DEPARTMENT_OTHER): Payer: Self-pay

## 2021-10-14 ENCOUNTER — Ambulatory Visit (HOSPITAL_BASED_OUTPATIENT_CLINIC_OR_DEPARTMENT_OTHER): Payer: 59 | Attending: Orthopaedic Surgery | Admitting: Physical Therapy

## 2021-10-14 ENCOUNTER — Encounter (HOSPITAL_BASED_OUTPATIENT_CLINIC_OR_DEPARTMENT_OTHER): Payer: Self-pay | Admitting: Physical Therapy

## 2021-10-14 ENCOUNTER — Other Ambulatory Visit (HOSPITAL_COMMUNITY): Payer: Self-pay

## 2021-10-14 ENCOUNTER — Ambulatory Visit (HOSPITAL_BASED_OUTPATIENT_CLINIC_OR_DEPARTMENT_OTHER): Payer: 59 | Admitting: Physical Therapy

## 2021-10-14 DIAGNOSIS — M6281 Muscle weakness (generalized): Secondary | ICD-10-CM | POA: Insufficient documentation

## 2021-10-14 DIAGNOSIS — M25611 Stiffness of right shoulder, not elsewhere classified: Secondary | ICD-10-CM | POA: Diagnosis not present

## 2021-10-14 DIAGNOSIS — R600 Localized edema: Secondary | ICD-10-CM | POA: Insufficient documentation

## 2021-10-14 DIAGNOSIS — M25511 Pain in right shoulder: Secondary | ICD-10-CM | POA: Diagnosis not present

## 2021-10-14 NOTE — Therapy (Addendum)
?OUTPATIENT PHYSICAL THERAPY SHOULDER TREATMENT ? ? ?Patient Name: Katelyn Reyes ?MRN: [id number] ?DOB:Dec 29, 1960, 61 y.o., female ?Today's Date: 10/14/2021 ? ? PT End of Session - 10/14/21 1636   ? ? Visit Number 7   ? Number of Visits 25   ? Date for PT Re-Evaluation 12/02/21   ? Authorization Type [employer]   ? PT Start Time 1600   ? PT Stop Time 1630   ? PT Time Calculation (min) 30 min   ? Activity Tolerance Patient tolerated treatment well   ? Behavior During Therapy [facility] for tasks assessed/performed   ? ?  ?  ? ?  ? ? ? ? ? ? ? ?Past Medical History:  ?Diagnosis Date  ? Allergic rhinitis   ? Anxiety   ? Arthritis   ? Asthma   ? related to seasonal allergies  ? Breast calcification, right 02/02/2013  ? Surgically excised 02/17/13 > path benign   ? Cancer [facility])   ? Depression   ? Family history of breast cancer 04/12/2021  ? Family history of ovarian cancer 04/12/2021  ? GERD (gastroesophageal reflux disease)   ? Hyperlipidemia   ? IUD 02/07/2010  ? removed  ? Rotator cuff tear   ? right  ? ?Past Surgical History:  ?Procedure Laterality Date  ? BREAST BIOPSY Right 02/17/2013  ? Procedure:  NEEDLE LOCALIZATION REMOVAL RIGHT BREAST CALCIFICATIONS;  Surgeon: Haywood Lasso, MD;  Location: [place];  Service: General;  Laterality: Right;  ? BREAST EXCISIONAL BIOPSY Right pt unsure  ? benign  ? BREAST RECONSTRUCTION WITH PLACEMENT OF TISSUE EXPANDER AND ALLODERM Bilateral 04/26/2021  ? Procedure: BREAST RECONSTRUCTION WITH PLACEMENT OF TISSUE EXPANDER AND ALLODERM;  Surgeon: Irene Limbo, MD;  Location: [place];  Service: Plastics;  Laterality: Bilateral;  ? BURCH PROCEDURE N/A 12/22/2013  ? Procedure: BURCH CYSTO URETHROPEXY, ANTERIOR AND POSTERIOR CULPORRHAPHY;  Surgeon: Ena Dawley, MD;  Location: [place] [facility];  Service: Gynecology;  Laterality: N/A;  ? CERVICAL DISC ARTHROPLASTY N/A 11/12/2017  ? Procedure: ARTIFICIAL DISC REPLACEMENT CERVICAL SIX-SEVEN;  Surgeon:  Ashok Pall, MD;  Location: [place];  Service: Neurosurgery;  Laterality: N/A;  ARTIFICIAL DISC REPLACEMENT CERVICAL 6- CERVICAL 7  ? CHOLECYSTECTOMY N/A 10/09/2017  ? Procedure: LAPAROSCOPIC CHOLECYSTECTOMY WITH INTRAOPERATIVE CHOLANGIOGRAM;  Surgeon: Judeth Horn, MD;  Location: [place];  Service: General;  Laterality: N/A;  ? KNEE ARTHROSCOPY WITH MEDIAL MENISECTOMY Right 05/01/2020  ? Procedure: KNEE ARTHROSCOPY WITH PARTIAL MEDIAL MENISECTOMY,   PATELLA  CHONDROPLASTY;  Surgeon: Paralee Cancel, MD;  Location: [facility];  Service: Orthopedics;  Laterality: Right;  ? LIPOSUCTION WITH LIPOFILLING Bilateral 07/23/2021  ? Procedure: LIPOFILLING FROM ABDOMEN TO BILATERAL CHEST;  Surgeon: Irene Limbo, MD;  Location: [place];  Service: Plastics;  Laterality: Bilateral;  ? MASTECTOMY W/ SENTINEL NODE BIOPSY Right 04/26/2021  ? Procedure: RIGHT MASTECTOMY WITH RIGHT SENTINEL LYMPH NODE BIOPSY;  Surgeon: [name], MD;  Location: [place];  Service: General;  Laterality: Right;  ? MOUTH SURGERY    ? NASAL SINUS SURGERY    ? REMOVAL OF BILATERAL TISSUE EXPANDERS WITH PLACEMENT OF BILATERAL BREAST IMPLANTS Bilateral 07/23/2021  ? Procedure: REMOVAL OF BILATERAL TISSUE EXPANDERS WITH PLACEMENT OF BILATERAL BREAST SILICONE IMPLANTS;  Surgeon: Irene Limbo, MD;  Location: [place];  Service: Plastics;  Laterality: Bilateral;  ? SHOULDER ARTHROSCOPY WITH ROTATOR CUFF REPAIR AND OPEN BICEPS TENODESIS Right 08/29/2021  ? Procedure: RIGHT SHOULDER ARTHROSCOPY WITH  ROTATOR CUFF REPAIR WITH PATCH AUGMENTATION AND  BICEPS TENODESIS;  Surgeon: Vanetta Mulders, MD;  Location: [place];  Service: Orthopedics;  Laterality: Right;  ? TOTAL MASTECTOMY Left 04/26/2021  ? Procedure: LEFT TOTAL MASTECTOMY;  Surgeon: [name], MD;  Location: [place];  Service: General;  Laterality: Left;  ? ?Patient Active Problem List  ?  Diagnosis Date Noted  ? Nontraumatic complete tear of right rotator cuff   ? Biceps tendinitis of right upper extremity   ? Breast cancer, right ([place]) 04/26/2021  ? Family history of breast cancer 04/12/2021  ? Malignant neoplasm of upper outer quadrant of female breast ([place]) 04/12/2021  ? Family history of colon cancer 04/12/2021  ? Family history of ovarian cancer 04/12/2021  ? Metabolic syndrome 29/51/8841  ? Diverticular disease of colon 07/17/2020  ? Family history of malignant neoplasm of gastrointestinal tract 07/17/2020  ? Fatty liver 07/17/2020  ? Nonalcoholic steatohepatitis (NASH) 07/17/2020  ? Personal history of colonic polyps 07/17/2020  ? Acute medial meniscal tear, right, subsequent encounter 05/01/2020  ? ETD (Eustachian tube dysfunction), bilateral 12/27/2019  ? Obesity (BMI 30-39.9) 07/12/2018  ? Cervical spondylosis with radiculopathy 11/12/2017  ? Vitamin D deficiency 07/07/2017  ? Urinary incontinence in female 12/22/2013  ? Asthma, mild intermittent 03/22/2013  ? Vaginal itching 06/01/2012  ? Thyromegaly 03/18/2012  ? General medical examination 03/14/2011  ? CHICKENPOX, HX OF 02/15/2010  ? Hyperlipidemia 02/14/2009  ? DEPRESSION 02/14/2009  ? ALLERGIC RHINITIS 02/14/2009  ? ? ?PCP: Midge Minium, MD ? ?REFERRING PROVIDER: Midge Minium, MD ? ?REFERRING DIAG:  ?M75.121 (ICD-10-CM) - Complete rotator cuff tear or rupture of right shoulder, not specified as traumatic  ?Y60.630 (ICD-10-CM) - Biceps tendonosis of right shoulder  ? ? ?THERAPY DIAG:  ?Stiffness of right shoulder, not elsewhere classified ? ?Muscle weakness (generalized) ? ?Localized edema ? ?Acute pain of right shoulder ? ? ?ONSET DATE: 08/29/21 Surgery ? ? ?PROCEDURE: 1.  Right rotator cuff arthroscopic repair with collagen patch augmentation ?2.  Right biceps tenodesis ?3.  Right shoulder extensive debridement ?4.  Right shoulder acromioplasty with subacromial decompression ? ?SUBJECTIVE:                                                                                                                                                                                      ? ?SUBJECTIVE STATEMENT: ?Pt states she fell onto the R shoulder. She was squatting down to pick something up and fell directly onto the shoulder. She states it was very painful but did not feel any particular pop or anything give way. She states the new meds have helped and she  was instructed to keep the sling on for a few extra days. ? ?PERTINENT HISTORY: ?Breast cancer, double mastectomy ? ?PAIN:  ?Are you having pain? Yes: NPRS scale: 3/10  ?Pain location: R shoulder, subscapular area ?Pain description: soreness ?Aggravating factors: movement ?Relieving factors: icing, rest, meds ? ?PRECAUTIONS: Shoulder ? ?WEIGHT BEARING RESTRICTIONS Yes, NWB, RC repair protocol ? 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 ?PATIENT GOALS : return to work, be able to travel again ? ?OBJECTIVE:  ? ?DIAGNOSTIC FINDINGS:  ?IMPRESSION: ?1. Complete tear of the supraspinatus tendon with 4 cm of ?retraction. ?2. Severe tendinosis of the infraspinatus tendon with an ?interstitial tear. ?3. Mild tendinosis of the intra-articular portion of the long head ?of the biceps tendon. ? ? ?TODAY'S TREATMENT:  ? ?PROM to tolerance ? ?Flexion 155 ?ABD 140 ?IR 50 ?ER 50 ? ? ?Held for today: ?Exercises ?-clinician assisted protraction 10x ?- Seated Scapular Retraction  - 2-3 x daily - 7 x weekly - 2 sets - 10 reps ?- Supine Shoulder Flexion AAROM with Hands Clasped  - 2-3 x daily - 7 x weekly - 1 sets - 10 reps - 3 hold ?- Seated Shoulder Flexion Towel Slide at Table Top  - 2-3 x daily - 7 x weekly - 2 sets - 10 reps ?- Seated Shoulder Abduction Towel Slide at Table Top  - 2-3 x daily - 7 x weekly - 2 sets - 10 reps ?- Seated Shoulder External Rotation AAROM with Cane and Hand in Neutral  - 2-3 x daily - 7 x weekly - 1 sets - 10 reps ? ?PATIENT EDUCATION: ?Education details:  precautions/protocol, edema management, joint protection, exercise progression, muscle firing,  HEP,  ? ? ?Person educated: Patient ?Education method: Explanation, Demonstration, Tactile cues, Verbal cues, and Handout

## 2021-10-15 DIAGNOSIS — H259 Unspecified age-related cataract: Secondary | ICD-10-CM | POA: Diagnosis not present

## 2021-10-15 DIAGNOSIS — H52223 Regular astigmatism, bilateral: Secondary | ICD-10-CM | POA: Diagnosis not present

## 2021-10-15 DIAGNOSIS — H524 Presbyopia: Secondary | ICD-10-CM | POA: Diagnosis not present

## 2021-10-17 ENCOUNTER — Ambulatory Visit (HOSPITAL_BASED_OUTPATIENT_CLINIC_OR_DEPARTMENT_OTHER): Payer: 59 | Admitting: Physical Therapy

## 2021-10-17 ENCOUNTER — Encounter (HOSPITAL_BASED_OUTPATIENT_CLINIC_OR_DEPARTMENT_OTHER): Payer: Self-pay | Admitting: Physical Therapy

## 2021-10-17 ENCOUNTER — Other Ambulatory Visit (HOSPITAL_BASED_OUTPATIENT_CLINIC_OR_DEPARTMENT_OTHER): Payer: Self-pay

## 2021-10-17 DIAGNOSIS — M25611 Stiffness of right shoulder, not elsewhere classified: Secondary | ICD-10-CM

## 2021-10-17 DIAGNOSIS — R600 Localized edema: Secondary | ICD-10-CM | POA: Diagnosis not present

## 2021-10-17 DIAGNOSIS — M25511 Pain in right shoulder: Secondary | ICD-10-CM

## 2021-10-17 DIAGNOSIS — M6281 Muscle weakness (generalized): Secondary | ICD-10-CM | POA: Diagnosis not present

## 2021-10-17 NOTE — Therapy (Signed)
?OUTPATIENT PHYSICAL THERAPY SHOULDER TREATMENT ? ? ?Patient Name: Katelyn Reyes ?MRN: [id number] ?DOB:03-08-1961, 61 y.o., female ?Today's Date: 10/17/2021 ? ? PT End of Session - 10/17/21 1556   ? ? Visit Number 8   ? Number of Visits 25   ? Date for PT Re-Evaluation 12/02/21   ? Authorization Type [employer]   ? PT Start Time 1515   ? PT Stop Time 2683   ? PT Time Calculation (min) 26 min   ? Activity Tolerance Patient tolerated treatment well   ? Behavior During Therapy [facility] for tasks assessed/performed   ? ?  ?  ? ?  ? ? ? ? ? ? ? ? ?Past Medical History:  ?Diagnosis Date  ? Allergic rhinitis   ? Anxiety   ? Arthritis   ? Asthma   ? related to seasonal allergies  ? Breast calcification, right 02/02/2013  ? Surgically excised 02/17/13 > path benign   ? Cancer [facility])   ? Depression   ? Family history of breast cancer 04/12/2021  ? Family history of ovarian cancer 04/12/2021  ? GERD (gastroesophageal reflux disease)   ? Hyperlipidemia   ? IUD 02/07/2010  ? removed  ? Rotator cuff tear   ? right  ? ?Past Surgical History:  ?Procedure Laterality Date  ? BREAST BIOPSY Right 02/17/2013  ? Procedure:  NEEDLE LOCALIZATION REMOVAL RIGHT BREAST CALCIFICATIONS;  Surgeon: Haywood Lasso, MD;  Location: [place];  Service: General;  Laterality: Right;  ? BREAST EXCISIONAL BIOPSY Right pt unsure  ? benign  ? BREAST RECONSTRUCTION WITH PLACEMENT OF TISSUE EXPANDER AND ALLODERM Bilateral 04/26/2021  ? Procedure: BREAST RECONSTRUCTION WITH PLACEMENT OF TISSUE EXPANDER AND ALLODERM;  Surgeon: Irene Limbo, MD;  Location: [place];  Service: Plastics;  Laterality: Bilateral;  ? BURCH PROCEDURE N/A 12/22/2013  ? Procedure: BURCH CYSTO URETHROPEXY, ANTERIOR AND POSTERIOR CULPORRHAPHY;  Surgeon: Ena Dawley, MD;  Location: [place] [facility];  Service: Gynecology;  Laterality: N/A;  ? CERVICAL DISC ARTHROPLASTY N/A 11/12/2017  ? Procedure: ARTIFICIAL DISC REPLACEMENT CERVICAL SIX-SEVEN;  Surgeon:  Ashok Pall, MD;  Location: [place];  Service: Neurosurgery;  Laterality: N/A;  ARTIFICIAL DISC REPLACEMENT CERVICAL 6- CERVICAL 7  ? CHOLECYSTECTOMY N/A 10/09/2017  ? Procedure: LAPAROSCOPIC CHOLECYSTECTOMY WITH INTRAOPERATIVE CHOLANGIOGRAM;  Surgeon: Judeth Horn, MD;  Location: [place];  Service: General;  Laterality: N/A;  ? KNEE ARTHROSCOPY WITH MEDIAL MENISECTOMY Right 05/01/2020  ? Procedure: KNEE ARTHROSCOPY WITH PARTIAL MEDIAL MENISECTOMY,   PATELLA  CHONDROPLASTY;  Surgeon: Paralee Cancel, MD;  Location: [facility];  Service: Orthopedics;  Laterality: Right;  ? LIPOSUCTION WITH LIPOFILLING Bilateral 07/23/2021  ? Procedure: LIPOFILLING FROM ABDOMEN TO BILATERAL CHEST;  Surgeon: Irene Limbo, MD;  Location: [place];  Service: Plastics;  Laterality: Bilateral;  ? MASTECTOMY W/ SENTINEL NODE BIOPSY Right 04/26/2021  ? Procedure: RIGHT MASTECTOMY WITH RIGHT SENTINEL LYMPH NODE BIOPSY;  Surgeon: [name], MD;  Location: [place];  Service: General;  Laterality: Right;  ? MOUTH SURGERY    ? NASAL SINUS SURGERY    ? REMOVAL OF BILATERAL TISSUE EXPANDERS WITH PLACEMENT OF BILATERAL BREAST IMPLANTS Bilateral 07/23/2021  ? Procedure: REMOVAL OF BILATERAL TISSUE EXPANDERS WITH PLACEMENT OF BILATERAL BREAST SILICONE IMPLANTS;  Surgeon: Irene Limbo, MD;  Location: [place];  Service: Plastics;  Laterality: Bilateral;  ? SHOULDER ARTHROSCOPY WITH ROTATOR CUFF REPAIR AND OPEN BICEPS TENODESIS Right 08/29/2021  ? Procedure: RIGHT SHOULDER ARTHROSCOPY  WITH ROTATOR CUFF REPAIR WITH PATCH AUGMENTATION AND  BICEPS TENODESIS;  Surgeon: Vanetta Mulders, MD;  Location: [place];  Service: Orthopedics;  Laterality: Right;  ? TOTAL MASTECTOMY Left 04/26/2021  ? Procedure: LEFT TOTAL MASTECTOMY;  Surgeon: [name], MD;  Location: [place];  Service: General;  Laterality: Left;  ? ?Patient Active Problem List  ?  Diagnosis Date Noted  ? Nontraumatic complete tear of right rotator cuff   ? Biceps tendinitis of right upper extremity   ? Breast cancer, right ([place]) 04/26/2021  ? Family history of breast cancer 04/12/2021  ? Malignant neoplasm of upper outer quadrant of female breast ([place]) 04/12/2021  ? Family history of colon cancer 04/12/2021  ? Family history of ovarian cancer 04/12/2021  ? Metabolic syndrome 91/63/8466  ? Diverticular disease of colon 07/17/2020  ? Family history of malignant neoplasm of gastrointestinal tract 07/17/2020  ? Fatty liver 07/17/2020  ? Nonalcoholic steatohepatitis (NASH) 07/17/2020  ? Personal history of colonic polyps 07/17/2020  ? Acute medial meniscal tear, right, subsequent encounter 05/01/2020  ? ETD (Eustachian tube dysfunction), bilateral 12/27/2019  ? Obesity (BMI 30-39.9) 07/12/2018  ? Cervical spondylosis with radiculopathy 11/12/2017  ? Vitamin D deficiency 07/07/2017  ? Urinary incontinence in female 12/22/2013  ? Asthma, mild intermittent 03/22/2013  ? Vaginal itching 06/01/2012  ? Thyromegaly 03/18/2012  ? General medical examination 03/14/2011  ? CHICKENPOX, HX OF 02/15/2010  ? Hyperlipidemia 02/14/2009  ? DEPRESSION 02/14/2009  ? ALLERGIC RHINITIS 02/14/2009  ? ? ?PCP: Midge Minium, MD ? ?REFERRING PROVIDER: Midge Minium, MD ? ?REFERRING DIAG:  ?M75.121 (ICD-10-CM) - Complete rotator cuff tear or rupture of right shoulder, not specified as traumatic  ?Z99.357 (ICD-10-CM) - Biceps tendonosis of right shoulder  ? ? ?THERAPY DIAG:  ?Stiffness of right shoulder, not elsewhere classified ? ?Muscle weakness (generalized) ? ?Localized edema ? ?Acute pain of right shoulder ? ? ?ONSET DATE: 08/29/21 Surgery ? ? ?PROCEDURE: 1.  Right rotator cuff arthroscopic repair with collagen patch augmentation ?2.  Right biceps tenodesis ?3.  Right shoulder extensive debridement ?4.  Right shoulder acromioplasty with subacromial decompression ? ?SUBJECTIVE:                                                                                                                                                                                      ? ?SUBJECTIVE STATEMENT: ?Pt states she fell onto the R shoulder. She was squatting down to pick something up and fell directly onto the shoulder. She states it was very painful but did not feel any particular pop or anything give way. She states the new meds have helped and  she was instructed to keep the sling on for a few extra days. ? ?PERTINENT HISTORY: ?Breast cancer, double mastectomy ? ?PAIN:  ?Are you having pain? Yes: NPRS scale: 3/10  ?Pain location: R shoulder, subscapular area ?Pain description: soreness ?Aggravating factors: movement ?Relieving factors: icing, rest, meds ? ?PRECAUTIONS: Shoulder ? ?WEIGHT BEARING RESTRICTIONS Yes, NWB, RC repair protocol ? 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 ?PATIENT GOALS : return to work, be able to travel again ? ?OBJECTIVE:  ? ?DIAGNOSTIC FINDINGS:  ?IMPRESSION: ?1. Complete tear of the supraspinatus tendon with 4 cm of ?retraction. ?2. Severe tendinosis of the infraspinatus tendon with an ?interstitial tear. ?3. Mild tendinosis of the intra-articular portion of the long head ?of the biceps tendon. ? ? ?TODAY'S TREATMENT:  ? ?PROM to tolerance ? ?Flexion 160 ?ABD 150 ?IR to belly ?ER 50 ? ?Prone ext 2x10 ?ABD and fleixon pulleys 2 min each ?Bilat ER no weight 2x10 ?Wall slide low 10x ? ? ? ?PATIENT EDUCATION: ?Education details: precautions/protocol, edema management, joint protection, exercise progression, muscle firing,  HEP,  ? ? ?Person educated: Patient ?Education method: Explanation, Demonstration, Tactile cues, Verbal cues, and Handouts ?Education comprehension: verbalized understanding, returned demonstration, verbal cues required, and tactile cues required ? ? ?HOME EXERCISE PROGRAM: ?Access Code: [email or web address] ?URL: [email or web address] ?Date: 10/17/2021 ?Prepared by: Daleen Bo ? ?Exercises ?- Seated Scapular Retraction  - 2-3 x daily - 7 x weekly - 2 sets - 10 reps ?- Seated Shoulder Flexion AAROM with Pulley Behind  - 2-3 x daily - 7 x weekly - 1 sets - 1 reps - 2 min hold ?

## 2021-10-21 ENCOUNTER — Encounter (HOSPITAL_BASED_OUTPATIENT_CLINIC_OR_DEPARTMENT_OTHER): Payer: Self-pay | Admitting: Physical Therapy

## 2021-10-21 ENCOUNTER — Ambulatory Visit (HOSPITAL_BASED_OUTPATIENT_CLINIC_OR_DEPARTMENT_OTHER): Payer: 59 | Admitting: Physical Therapy

## 2021-10-21 DIAGNOSIS — M6281 Muscle weakness (generalized): Secondary | ICD-10-CM | POA: Diagnosis not present

## 2021-10-21 DIAGNOSIS — M25611 Stiffness of right shoulder, not elsewhere classified: Secondary | ICD-10-CM

## 2021-10-21 DIAGNOSIS — R600 Localized edema: Secondary | ICD-10-CM

## 2021-10-21 DIAGNOSIS — M25511 Pain in right shoulder: Secondary | ICD-10-CM | POA: Diagnosis not present

## 2021-10-21 NOTE — Therapy (Signed)
?OUTPATIENT PHYSICAL THERAPY SHOULDER TREATMENT ? ? ?Patient Name: Katelyn Reyes ?MRN: [id number] ?DOB:12/30/1960, 60 y.o., female ?Today's Date: 10/21/2021 ? ? PT End of Session - 10/21/21 1514   ? ? Visit Number 9   ? Number of Visits 25   ? Date for PT Re-Evaluation 12/02/21   ? Authorization Type [employer]   ? PT Start Time 1515   ? PT Stop Time 7829   ? PT Time Calculation (min) 34 min   ? Activity Tolerance Patient tolerated treatment well   ? Behavior During Therapy [facility] for tasks assessed/performed   ? ?  ?  ? ?  ? ? ? ? ? ? ? ? ? ?Past Medical History:  ?Diagnosis Date  ? Allergic rhinitis   ? Anxiety   ? Arthritis   ? Asthma   ? related to seasonal allergies  ? Breast calcification, right 02/02/2013  ? Surgically excised 02/17/13 > path benign   ? Cancer [facility])   ? Depression   ? Family history of breast cancer 04/12/2021  ? Family history of ovarian cancer 04/12/2021  ? GERD (gastroesophageal reflux disease)   ? Hyperlipidemia   ? IUD 02/07/2010  ? removed  ? Rotator cuff tear   ? right  ? ?Past Surgical History:  ?Procedure Laterality Date  ? BREAST BIOPSY Right 02/17/2013  ? Procedure:  NEEDLE LOCALIZATION REMOVAL RIGHT BREAST CALCIFICATIONS;  Surgeon: Haywood Lasso, MD;  Location: [place];  Service: General;  Laterality: Right;  ? BREAST EXCISIONAL BIOPSY Right pt unsure  ? benign  ? BREAST RECONSTRUCTION WITH PLACEMENT OF TISSUE EXPANDER AND ALLODERM Bilateral 04/26/2021  ? Procedure: BREAST RECONSTRUCTION WITH PLACEMENT OF TISSUE EXPANDER AND ALLODERM;  Surgeon: Irene Limbo, MD;  Location: [place];  Service: Plastics;  Laterality: Bilateral;  ? BURCH PROCEDURE N/A 12/22/2013  ? Procedure: BURCH CYSTO URETHROPEXY, ANTERIOR AND POSTERIOR CULPORRHAPHY;  Surgeon: Ena Dawley, MD;  Location: [place] [facility];  Service: Gynecology;  Laterality: N/A;  ? CERVICAL DISC ARTHROPLASTY N/A 11/12/2017  ? Procedure: ARTIFICIAL DISC REPLACEMENT CERVICAL SIX-SEVEN;  Surgeon:  Ashok Pall, MD;  Location: [place];  Service: Neurosurgery;  Laterality: N/A;  ARTIFICIAL DISC REPLACEMENT CERVICAL 6- CERVICAL 7  ? CHOLECYSTECTOMY N/A 10/09/2017  ? Procedure: LAPAROSCOPIC CHOLECYSTECTOMY WITH INTRAOPERATIVE CHOLANGIOGRAM;  Surgeon: Judeth Horn, MD;  Location: [place];  Service: General;  Laterality: N/A;  ? KNEE ARTHROSCOPY WITH MEDIAL MENISECTOMY Right 05/01/2020  ? Procedure: KNEE ARTHROSCOPY WITH PARTIAL MEDIAL MENISECTOMY,   PATELLA  CHONDROPLASTY;  Surgeon: Paralee Cancel, MD;  Location: [facility];  Service: Orthopedics;  Laterality: Right;  ? LIPOSUCTION WITH LIPOFILLING Bilateral 07/23/2021  ? Procedure: LIPOFILLING FROM ABDOMEN TO BILATERAL CHEST;  Surgeon: Irene Limbo, MD;  Location: [place];  Service: Plastics;  Laterality: Bilateral;  ? MASTECTOMY W/ SENTINEL NODE BIOPSY Right 04/26/2021  ? Procedure: RIGHT MASTECTOMY WITH RIGHT SENTINEL LYMPH NODE BIOPSY;  Surgeon: [name], MD;  Location: [place];  Service: General;  Laterality: Right;  ? MOUTH SURGERY    ? NASAL SINUS SURGERY    ? REMOVAL OF BILATERAL TISSUE EXPANDERS WITH PLACEMENT OF BILATERAL BREAST IMPLANTS Bilateral 07/23/2021  ? Procedure: REMOVAL OF BILATERAL TISSUE EXPANDERS WITH PLACEMENT OF BILATERAL BREAST SILICONE IMPLANTS;  Surgeon: Irene Limbo, MD;  Location: [place];  Service: Plastics;  Laterality: Bilateral;  ? SHOULDER ARTHROSCOPY WITH ROTATOR CUFF REPAIR AND OPEN BICEPS TENODESIS Right 08/29/2021  ? Procedure: RIGHT SHOULDER  ARTHROSCOPY WITH ROTATOR CUFF REPAIR WITH PATCH AUGMENTATION AND  BICEPS TENODESIS;  Surgeon: Vanetta Mulders, MD;  Location: [place];  Service: Orthopedics;  Laterality: Right;  ? TOTAL MASTECTOMY Left 04/26/2021  ? Procedure: LEFT TOTAL MASTECTOMY;  Surgeon: [name], MD;  Location: [place];  Service: General;  Laterality: Left;  ? ?Patient Active Problem List  ?  Diagnosis Date Noted  ? Nontraumatic complete tear of right rotator cuff   ? Biceps tendinitis of right upper extremity   ? Breast cancer, right ([place]) 04/26/2021  ? Family history of breast cancer 04/12/2021  ? Malignant neoplasm of upper outer quadrant of female breast ([place]) 04/12/2021  ? Family history of colon cancer 04/12/2021  ? Family history of ovarian cancer 04/12/2021  ? Metabolic syndrome 44/81/8563  ? Diverticular disease of colon 07/17/2020  ? Family history of malignant neoplasm of gastrointestinal tract 07/17/2020  ? Fatty liver 07/17/2020  ? Nonalcoholic steatohepatitis (NASH) 07/17/2020  ? Personal history of colonic polyps 07/17/2020  ? Acute medial meniscal tear, right, subsequent encounter 05/01/2020  ? ETD (Eustachian tube dysfunction), bilateral 12/27/2019  ? Obesity (BMI 30-39.9) 07/12/2018  ? Cervical spondylosis with radiculopathy 11/12/2017  ? Vitamin D deficiency 07/07/2017  ? Urinary incontinence in female 12/22/2013  ? Asthma, mild intermittent 03/22/2013  ? Vaginal itching 06/01/2012  ? Thyromegaly 03/18/2012  ? General medical examination 03/14/2011  ? CHICKENPOX, HX OF 02/15/2010  ? Hyperlipidemia 02/14/2009  ? DEPRESSION 02/14/2009  ? ALLERGIC RHINITIS 02/14/2009  ? ? ?PCP: Midge Minium, MD ? ?REFERRING PROVIDER: Midge Minium, MD ? ?REFERRING DIAG:  ?M75.121 (ICD-10-CM) - Complete rotator cuff tear or rupture of right shoulder, not specified as traumatic  ?J49.702 (ICD-10-CM) - Biceps tendonosis of right shoulder  ? ? ?THERAPY DIAG:  ?Stiffness of right shoulder, not elsewhere classified ? ?Muscle weakness (generalized) ? ?Localized edema ? ?Acute pain of right shoulder ? ? ?ONSET DATE: 08/29/21 Surgery ? ? ?PROCEDURE: 1.  Right rotator cuff arthroscopic repair with collagen patch augmentation ?2.  Right biceps tenodesis ?3.  Right shoulder extensive debridement ?4.  Right shoulder acromioplasty with subacromial decompression ? ?SUBJECTIVE:                                                                                                                                                                                      ? ?SUBJECTIVE STATEMENT: ?Pt states her shoulder has been more sore since last session. It is not pain but she is sore from the new exercise/activity. ? ?PERTINENT HISTORY: ?Breast cancer, double mastectomy ? ?PAIN:  ?Are you having pain? Yes: NPRS scale: 4/10  ?Pain location: R shoulder,  subscapular area ?Pain description: soreness ?Aggravating factors: movement ?Relieving factors: icing, rest, meds ? ?PRECAUTIONS: Shoulder ? ?WEIGHT BEARING RESTRICTIONS Yes, NWB, RC repair protocol ? 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 ?PATIENT GOALS : return to work, be able to travel again ? ?OBJECTIVE:  ? ?DIAGNOSTIC FINDINGS:  ?IMPRESSION: ?1. Complete tear of the supraspinatus tendon with 4 cm of ?retraction. ?2. Severe tendinosis of the infraspinatus tendon with an ?interstitial tear. ?3. Mild tendinosis of the intra-articular portion of the long head ?of the biceps tendon. ? ? ?TODAY'S TREATMENT:  ? ?PROM to tolerance ? ?Flexion 160 ?ABD 150 ?IR to belly ?ER 55 ? ?Manual: grade II inf and post R GHJ gliding ?Short lever ABD 2x10 ?Bilat ER no weight 2x10 ? ? ?Previous: ?Prone ext 2x10 ?ABD and fleixon pulleys 2 min each ?Bilat ER no weight 2x10 ?Wall slide low 10x ? ? ? ?PATIENT EDUCATION: ?Education details: precautions/protocol, edema management, joint protection, exercise progression, muscle firing,  HEP,  ? ? ?Person educated: Patient ?Education method: Explanation, Demonstration, Tactile cues, Verbal cues, and Handouts ?Education comprehension: verbalized understanding, returned demonstration, verbal cues required, and tactile cues required ? ? ?HOME EXERCISE PROGRAM: ?Access Code: [email or web address] ?URL: [email or web address] ?Date: 10/17/2021 ?Prepared by: Daleen Bo ? ?Exercises ?- Seated Scapular Retraction  - 2-3 x daily - 7 x weekly - 2 sets -  10 reps ?- Seated Shoulder Flexion AAROM with Pulley Behind  - 2-3 x daily - 7 x weekly - 1 sets - 1 reps - 2 min hold ?- Seated Shoulder Abduction AAROM with Pulley Behind  - 2-3 x daily - 7 x week

## 2021-10-23 ENCOUNTER — Telehealth: Payer: Self-pay | Admitting: Family Medicine

## 2021-10-23 ENCOUNTER — Other Ambulatory Visit (HOSPITAL_BASED_OUTPATIENT_CLINIC_OR_DEPARTMENT_OTHER): Payer: Self-pay

## 2021-10-23 NOTE — Telephone Encounter (Signed)
Called and spoke with patient to let her know that the PA for the medication has already been sent to cover my meds. ?

## 2021-10-23 NOTE — Telephone Encounter (Signed)
Pt called stating that the ozempic is requiring a PA, she states that the pharmacy told her they contacted [place] on Friday. ? ?Can we check the status on this.  ? ?Pt would like a call back about this.  ?

## 2021-10-24 ENCOUNTER — Ambulatory Visit (HOSPITAL_BASED_OUTPATIENT_CLINIC_OR_DEPARTMENT_OTHER): Payer: 59 | Admitting: Physical Therapy

## 2021-10-24 ENCOUNTER — Encounter (HOSPITAL_BASED_OUTPATIENT_CLINIC_OR_DEPARTMENT_OTHER): Payer: Self-pay | Admitting: Physical Therapy

## 2021-10-24 ENCOUNTER — Ambulatory Visit (INDEPENDENT_AMBULATORY_CARE_PROVIDER_SITE_OTHER): Payer: 59 | Admitting: Orthopaedic Surgery

## 2021-10-24 DIAGNOSIS — M25511 Pain in right shoulder: Secondary | ICD-10-CM

## 2021-10-24 DIAGNOSIS — M25611 Stiffness of right shoulder, not elsewhere classified: Secondary | ICD-10-CM | POA: Diagnosis not present

## 2021-10-24 DIAGNOSIS — R600 Localized edema: Secondary | ICD-10-CM | POA: Diagnosis not present

## 2021-10-24 DIAGNOSIS — M6281 Muscle weakness (generalized): Secondary | ICD-10-CM | POA: Diagnosis not present

## 2021-10-24 DIAGNOSIS — M75121 Complete rotator cuff tear or rupture of right shoulder, not specified as traumatic: Secondary | ICD-10-CM

## 2021-10-24 NOTE — Progress Notes (Signed)
? ?                            ? ? ?Post Operative Evaluation ?  ? ?Procedure/Date of Surgery: Right shoulder rotator cuff repair with biceps tenodesis August 29, 2021 ? ?Interval History:  ? ? ? ?Presents today for follow-up after the above procedure.  Overall she is dramatically feels much better after a Medrol Dosepak.  She has been working on regaining range of motion with [facility].  Overall she does feel much better at today's visit ? ?PMH/PSH/Family History/Social History/Meds/Allergies:   ? ?Past Medical History:  ?Diagnosis Date  ? Allergic rhinitis   ? Anxiety   ? Arthritis   ? Asthma   ? related to seasonal allergies  ? Breast calcification, right 02/02/2013  ? Surgically excised 02/17/13 > path benign   ? Cancer [facility])   ? Depression   ? Family history of breast cancer 04/12/2021  ? Family history of ovarian cancer 04/12/2021  ? GERD (gastroesophageal reflux disease)   ? Hyperlipidemia   ? IUD 02/07/2010  ? removed  ? Rotator cuff tear   ? right  ? ?Past Surgical History:  ?Procedure Laterality Date  ? BREAST BIOPSY Right 02/17/2013  ? Procedure:  NEEDLE LOCALIZATION REMOVAL RIGHT BREAST CALCIFICATIONS;  Surgeon: Haywood Lasso, MD;  Location: [place];  Service: General;  Laterality: Right;  ? BREAST EXCISIONAL BIOPSY Right pt unsure  ? benign  ? BREAST RECONSTRUCTION WITH PLACEMENT OF TISSUE EXPANDER AND ALLODERM Bilateral 04/26/2021  ? Procedure: BREAST RECONSTRUCTION WITH PLACEMENT OF TISSUE EXPANDER AND ALLODERM;  Surgeon: Irene Limbo, MD;  Location: [place];  Service: Plastics;  Laterality: Bilateral;  ? BURCH PROCEDURE N/A 12/22/2013  ? Procedure: BURCH CYSTO URETHROPEXY, ANTERIOR AND POSTERIOR CULPORRHAPHY;  Surgeon: Ena Dawley, MD;  Location: [facility];  Service: Gynecology;  Laterality: N/A;  ? CERVICAL DISC ARTHROPLASTY N/A 11/12/2017  ? Procedure: ARTIFICIAL DISC REPLACEMENT CERVICAL SIX-SEVEN;  Surgeon: Ashok Pall, MD;  Location: [place];   Service: Neurosurgery;  Laterality: N/A;  ARTIFICIAL DISC REPLACEMENT CERVICAL 6- CERVICAL 7  ? CHOLECYSTECTOMY N/A 10/09/2017  ? Procedure: LAPAROSCOPIC CHOLECYSTECTOMY WITH INTRAOPERATIVE CHOLANGIOGRAM;  Surgeon: Judeth Horn, MD;  Location: [place];  Service: General;  Laterality: N/A;  ? KNEE ARTHROSCOPY WITH MEDIAL MENISECTOMY Right 05/01/2020  ? Procedure: KNEE ARTHROSCOPY WITH PARTIAL MEDIAL MENISECTOMY,   PATELLA  CHONDROPLASTY;  Surgeon: Paralee Cancel, MD;  Location: [facility];  Service: Orthopedics;  Laterality: Right;  ? LIPOSUCTION WITH LIPOFILLING Bilateral 07/23/2021  ? Procedure: LIPOFILLING FROM ABDOMEN TO BILATERAL CHEST;  Surgeon: Irene Limbo, MD;  Location: [place];  Service: Plastics;  Laterality: Bilateral;  ? MASTECTOMY W/ SENTINEL NODE BIOPSY Right 04/26/2021  ? Procedure: RIGHT MASTECTOMY WITH RIGHT SENTINEL LYMPH NODE BIOPSY;  Surgeon: [name], MD;  Location: [place];  Service: General;  Laterality: Right;  ? MOUTH SURGERY    ? NASAL SINUS SURGERY    ? REMOVAL OF BILATERAL TISSUE EXPANDERS WITH PLACEMENT OF BILATERAL BREAST IMPLANTS Bilateral 07/23/2021  ? Procedure: REMOVAL OF BILATERAL TISSUE EXPANDERS WITH PLACEMENT OF BILATERAL BREAST SILICONE IMPLANTS;  Surgeon: Irene Limbo, MD;  Location: [place];  Service: Plastics;  Laterality: Bilateral;  ? SHOULDER ARTHROSCOPY WITH ROTATOR CUFF REPAIR AND OPEN BICEPS TENODESIS Right 08/29/2021  ? Procedure: RIGHT SHOULDER ARTHROSCOPY WITH ROTATOR CUFF REPAIR WITH PATCH AUGMENTATION AND  BICEPS TENODESIS;  Surgeon: Vanetta Mulders, MD;  Location: [place];  Service: Orthopedics;  Laterality: Right;  ? TOTAL MASTECTOMY Left 04/26/2021  ? Procedure: LEFT TOTAL MASTECTOMY;  Surgeon: [name], MD;  Location: [place];  Service: General;  Laterality: Left;  ? ?Social History  ? ?Socioeconomic History  ? Marital status: Single  ?   Spouse name: Not on file  ? Number of children: Not on file  ? Years of education: Not on file  ? Highest education level: Not on file  ?Occupational History  ? Not on file  ?Tobacco Use  ? Smoking status: Never  ? Smokeless tobacco: Never  ?Vaping Use  ? Vaping Use: Never used  ?Substance and Sexual Activity  ? Alcohol use: Yes  ?  Comment: social  ? Drug use: No  ? Sexual activity: Yes  ?  Birth control/protection: Post-menopausal  ?Other Topics Concern  ? Not on file  ?Social History Narrative  ? Not on file  ? ?Social Determinants of Health  ? ?Financial Resource Strain: Not on file  ?Food Insecurity: Not on file  ?Transportation Needs: Not on file  ?Physical Activity: Not on file  ?Stress: Not on file  ?Social Connections: Not on file  ? ?Family History  ?Problem Relation Age of Onset  ? Fibrocystic breast disease Mother   ? Hypertension Father   ? Diabetes Father   ? Breast cancer Maternal Aunt   ?     dx 55s  ? Colon cancer Paternal Aunt   ?     x2 pat aunts, dx after 51  ? Stroke Maternal Grandmother   ? Cancer Other   ?     ovarian/GYN; MGF's mother  ? Colon cancer Other   ?     MGM's father; dx after 54  ? Coronary artery disease Neg Hx   ? ?Allergies  ?Allergen Reactions  ? Amoxicillin Rash and Other (See Comments)  ?  Has patient had a PCN reaction causing immediate rash, facial/tongue/throat swelling, SOB or lightheadedness with hypotension: No ?Has patient had a PCN reaction causing severe rash involving mucus membranes or skin necrosis: No ?Has patient had a PCN reaction that required treatment: #  #  #  YES  #  #  # - MD office ?Has patient had a PCN reaction occurring within the last 10 years: Yes ?If all of the above answers are "NO", then may proceed with Cephalosporin use. ?  ? Erythromycin Nausea Only  ? Hydrocodone-Acetaminophen Other (See Comments)  ?  Hallucinations  ? Penicillin G Rash  ? Shellfish Allergy Itching and Other (See Comments)  ?  REACTS TO SCALLOPS ?  ? ?Current Outpatient  Medications  ?Medication Sig Dispense Refill  ? albuterol (VENTOLIN HFA) 108 (90 Base) MCG/ACT inhaler INHALE 2 PUFFS INTO THE LUNGS EVERY 6 HOURS AS NEEDED FOR WHEEZING. 18 g 6  ? ALPRAZolam (XANAX) 0.5 MG tablet Take 1 tablet (0.5 mg total) by mouth 3 (three) times daily as needed. 60 tablet 3  ? anastrozole (ARIMIDEX) 1 MG tablet Take 1 tablet (1 mg total) by mouth daily. 90 tablet 12  ? cetirizine (ZYRTEC) 10 MG tablet TAKE 1 TABLET (10 MG TOTAL) BY MOUTH DAILY. 90 tablet 1  ? Cholecalciferol (VITAMIN D-3) 125 MCG (5000 UT) TABS Take 5,000 Units by mouth every evening.    ? fluticasone (FLONASE) 50 MCG/ACT nasal spray PLACE 2 SPRAYS INTO BOTH NOSTRILS DAILY. 16 g 6  ? methocarbamol (ROBAXIN) 500 MG  tablet Take 1 tablet (500 mg total) by mouth in the morning and at bedtime. 30 tablet 0  ? methylPREDNISolone (MEDROL DOSEPAK) 4 MG TBPK tablet Take per packet instructions 21 tablet 0  ? montelukast (SINGULAIR) 10 MG tablet TAKE 1 TABLET (10 MG TOTAL) BY MOUTH DAILY. 90 tablet 1  ? oxyCODONE (OXY IR/ROXICODONE) 5 MG immediate release tablet Take 1 tablet (5 mg total) by mouth every 4 (four) hours as needed (severe pain). 20 tablet 0  ? pantoprazole (PROTONIX) 40 MG tablet Take 1 tablet (40 mg total) by mouth 2 (two) times daily. 180 tablet 1  ? Semaglutide, 1 MG/DOSE, (OZEMPIC, 1 MG/DOSE,) 4 MG/3ML SOPN Inject 1 mg as directed once a week. 3 mL 2  ? sertraline (ZOLOFT) 100 MG tablet Take 1 tablet (100 mg total) by mouth daily. 90 tablet 1  ? zolpidem (AMBIEN) 10 MG tablet Take 1 tablet (10 mg total) by mouth at bedtime as needed for sleep. 30 tablet 3  ? ?No current facility-administered medications for this visit.  ? ?No results found. ? ?Review of Systems:   ?A ROS was performed including pertinent positives and negatives as documented in the HPI. ? ? ?Musculoskeletal Exam:   ? ?There were no vitals taken for this visit. ? ?Skin incisions are well-healed. Active forward elevation is to 100 degrees with some  weakness.  External rotation at the side is to 45 degrees without difficulty she able flex and extend at the right elbow.  She able to fire EPL as well as wrist extensors 2+ radial pulse ? ?Imaging:   ? ?None ?

## 2021-10-24 NOTE — Therapy (Signed)
?OUTPATIENT PHYSICAL THERAPY SHOULDER TREATMENT ? ? ?Patient Name: Katelyn Reyes ?MRN: [id number] ?DOB:1961-01-24, 61 y.o., female ?Today's Date: 10/24/2021 ? ? PT End of Session - 10/24/21 1509   ? ? Visit Number 10   ? Number of Visits 25   ? Date for PT Re-Evaluation 12/02/21   ? Authorization Type [employer]   ? PT Start Time 1515   ? PT Stop Time 0258   ? PT Time Calculation (min) 39 min   ? Activity Tolerance Patient tolerated treatment well   ? Behavior During Therapy [facility] for tasks assessed/performed   ? ?  ?  ? ?  ? ? ? ? ? ? ? ? ? ? ?Past Medical History:  ?Diagnosis Date  ? Allergic rhinitis   ? Anxiety   ? Arthritis   ? Asthma   ? related to seasonal allergies  ? Breast calcification, right 02/02/2013  ? Surgically excised 02/17/13 > path benign   ? Cancer [facility])   ? Depression   ? Family history of breast cancer 04/12/2021  ? Family history of ovarian cancer 04/12/2021  ? GERD (gastroesophageal reflux disease)   ? Hyperlipidemia   ? IUD 02/07/2010  ? removed  ? Rotator cuff tear   ? right  ? ?Past Surgical History:  ?Procedure Laterality Date  ? BREAST BIOPSY Right 02/17/2013  ? Procedure:  NEEDLE LOCALIZATION REMOVAL RIGHT BREAST CALCIFICATIONS;  Surgeon: Haywood Lasso, MD;  Location: [place];  Service: General;  Laterality: Right;  ? BREAST EXCISIONAL BIOPSY Right pt unsure  ? benign  ? BREAST RECONSTRUCTION WITH PLACEMENT OF TISSUE EXPANDER AND ALLODERM Bilateral 04/26/2021  ? Procedure: BREAST RECONSTRUCTION WITH PLACEMENT OF TISSUE EXPANDER AND ALLODERM;  Surgeon: Irene Limbo, MD;  Location: [place];  Service: Plastics;  Laterality: Bilateral;  ? BURCH PROCEDURE N/A 12/22/2013  ? Procedure: BURCH CYSTO URETHROPEXY, ANTERIOR AND POSTERIOR CULPORRHAPHY;  Surgeon: Ena Dawley, MD;  Location: [place] [facility];  Service: Gynecology;  Laterality: N/A;  ? CERVICAL DISC ARTHROPLASTY N/A 11/12/2017  ? Procedure: ARTIFICIAL DISC REPLACEMENT CERVICAL SIX-SEVEN;   Surgeon: Ashok Pall, MD;  Location: [place];  Service: Neurosurgery;  Laterality: N/A;  ARTIFICIAL DISC REPLACEMENT CERVICAL 6- CERVICAL 7  ? CHOLECYSTECTOMY N/A 10/09/2017  ? Procedure: LAPAROSCOPIC CHOLECYSTECTOMY WITH INTRAOPERATIVE CHOLANGIOGRAM;  Surgeon: Judeth Horn, MD;  Location: [place];  Service: General;  Laterality: N/A;  ? KNEE ARTHROSCOPY WITH MEDIAL MENISECTOMY Right 05/01/2020  ? Procedure: KNEE ARTHROSCOPY WITH PARTIAL MEDIAL MENISECTOMY,   PATELLA  CHONDROPLASTY;  Surgeon: Paralee Cancel, MD;  Location: [facility];  Service: Orthopedics;  Laterality: Right;  ? LIPOSUCTION WITH LIPOFILLING Bilateral 07/23/2021  ? Procedure: LIPOFILLING FROM ABDOMEN TO BILATERAL CHEST;  Surgeon: Irene Limbo, MD;  Location: [place];  Service: Plastics;  Laterality: Bilateral;  ? MASTECTOMY W/ SENTINEL NODE BIOPSY Right 04/26/2021  ? Procedure: RIGHT MASTECTOMY WITH RIGHT SENTINEL LYMPH NODE BIOPSY;  Surgeon: [name], MD;  Location: [place];  Service: General;  Laterality: Right;  ? MOUTH SURGERY    ? NASAL SINUS SURGERY    ? REMOVAL OF BILATERAL TISSUE EXPANDERS WITH PLACEMENT OF BILATERAL BREAST IMPLANTS Bilateral 07/23/2021  ? Procedure: REMOVAL OF BILATERAL TISSUE EXPANDERS WITH PLACEMENT OF BILATERAL BREAST SILICONE IMPLANTS;  Surgeon: Irene Limbo, MD;  Location: [place];  Service: Plastics;  Laterality: Bilateral;  ? SHOULDER ARTHROSCOPY WITH ROTATOR CUFF REPAIR AND OPEN BICEPS TENODESIS Right 08/29/2021  ? Procedure: RIGHT  SHOULDER ARTHROSCOPY WITH ROTATOR CUFF REPAIR WITH PATCH AUGMENTATION AND  BICEPS TENODESIS;  Surgeon: Vanetta Mulders, MD;  Location: [place];  Service: Orthopedics;  Laterality: Right;  ? TOTAL MASTECTOMY Left 04/26/2021  ? Procedure: LEFT TOTAL MASTECTOMY;  Surgeon: [name], MD;  Location: [place];  Service: General;  Laterality: Left;  ? ?Patient Active Problem  List  ? Diagnosis Date Noted  ? Nontraumatic complete tear of right rotator cuff   ? Biceps tendinitis of right upper extremity   ? Breast cancer, right ([place]) 04/26/2021  ? Family history of breast cancer 04/12/2021  ? Malignant neoplasm of upper outer quadrant of female breast ([place]) 04/12/2021  ? Family history of colon cancer 04/12/2021  ? Family history of ovarian cancer 04/12/2021  ? Metabolic syndrome 38/18/2993  ? Diverticular disease of colon 07/17/2020  ? Family history of malignant neoplasm of gastrointestinal tract 07/17/2020  ? Fatty liver 07/17/2020  ? Nonalcoholic steatohepatitis ([facility]) 07/17/2020  ? Personal history of colonic polyps 07/17/2020  ? Acute medial meniscal tear, right, subsequent encounter 05/01/2020  ? ETD (Eustachian tube dysfunction), bilateral 12/27/2019  ? Obesity (BMI 30-39.9) 07/12/2018  ? Cervical spondylosis with radiculopathy 11/12/2017  ? Vitamin D deficiency 07/07/2017  ? Urinary incontinence in female 12/22/2013  ? Asthma, mild intermittent 03/22/2013  ? Vaginal itching 06/01/2012  ? Thyromegaly 03/18/2012  ? General medical examination 03/14/2011  ? CHICKENPOX, HX OF 02/15/2010  ? Hyperlipidemia 02/14/2009  ? DEPRESSION 02/14/2009  ? ALLERGIC RHINITIS 02/14/2009  ? ? ?PCP: Midge Minium, MD ? ?REFERRING PROVIDER: Midge Minium, MD ? ?REFERRING DIAG:  ?M75.121 (ICD-10-CM) - Complete rotator cuff tear or rupture of right shoulder, not specified as traumatic  ?Z16.967 (ICD-10-CM) - Biceps tendonosis of right shoulder  ? ? ?THERAPY DIAG:  ?Stiffness of right shoulder, not elsewhere classified ? ?Muscle weakness (generalized) ? ?Localized edema ? ?Acute pain of right shoulder ? ? ?ONSET DATE: 08/29/21 Surgery ? ? ?PROCEDURE: 1.  Right rotator cuff arthroscopic repair with collagen patch augmentation ?2.  Right biceps tenodesis ?3.  Right shoulder extensive debridement ?4.  Right shoulder acromioplasty with subacromial decompression ? ?SUBJECTIVE:                                                                                                                                                                                      ? ?SUBJECTIVE STATEMENT: ?Pt states her shoulder has been better. It is much less sore after taking ibuprofen. Pt states MD visit went well. She is to continue with shoulder protocol and able to return to work with 5lb limitation. ? ?PERTINENT HISTORY: ?Breast cancer, double mastectomy ? ?  PAIN:  ?Are you having pain? Yes: NPRS scale: 4/10  ?Pain location: R shoulder, subscapular area ?Pain description: soreness ?Aggravating factors: movement ?Relieving factors: icing, rest, meds ? ?PRECAUTIONS: Shoulder ? ?WEIGHT BEARING RESTRICTIONS Yes, NWB, RC repair protocol ? 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 ?PATIENT GOALS : return to work, be able to travel again ? ?OBJECTIVE:  ? ?DIAGNOSTIC FINDINGS:  ?IMPRESSION: ?1. Complete tear of the supraspinatus tendon with 4 cm of ?retraction. ?2. Severe tendinosis of the infraspinatus tendon with an ?interstitial tear. ?3. Mild tendinosis of the intra-articular portion of the long head ?of the biceps tendon. ? ?UPPER EXTREMITY ROM:  ?  ?AROM 5/11 PROM 5/11  ?Shoulder flexion 60 165  ?Shoulder extension     ?Shoulder abduction 75 150  ?Shoulder adduction     ?Shoulder internal rotation To belly belly  ?Shoulder external rotation 50  55  ?(Blank rows = not tested) ?  ?UPPER EXTREMITY MMT: ?  ?No appropriate to assess at this time. Able to demo at least 3-/5 in all planes ?  ?JOINT MOBILITY TESTING:  ?R GHJ stiffness into ant, post, and inf gliding ?  ?PALPATION:  ?No TTP around surgical sites ?                  ? ?TODAY'S TREATMENT:  ? ?PROM to tolerance ? ?Flexion 16165 ?ABD 150 ?IR to belly ?ER 55 ? ?Manual: grade II inf and post R GHJ gliding ? ? ?Exercises ?- Seated Shoulder Flexion AAROM with Pulley Behind  - 2-3 x daily - 7 x weekly - 1 sets - 1 reps - 2 min hold ?- Seated Shoulder Abduction AAROM with  Pulley Behind  - 2-3 x daily - 7 x weekly - 1 sets - 1 reps - 18mn hold ?- Standing Shoulder Row with Anchored Resistance  - 1 x daily - 7 x weekly - 3 sets - 10 reps ?- Prone Shoulder Extension - Singl

## 2021-10-25 ENCOUNTER — Encounter (HOSPITAL_BASED_OUTPATIENT_CLINIC_OR_DEPARTMENT_OTHER): Payer: Self-pay

## 2021-10-25 ENCOUNTER — Telehealth: Payer: Self-pay

## 2021-10-25 ENCOUNTER — Other Ambulatory Visit (HOSPITAL_BASED_OUTPATIENT_CLINIC_OR_DEPARTMENT_OTHER): Payer: Self-pay

## 2021-10-25 NOTE — Telephone Encounter (Signed)
Pt called in stated the Ozempic was denied and has asked what the next steps are.  ? ?

## 2021-10-25 NOTE — Telephone Encounter (Signed)
Called Katelyn Reyes to let her know she could chek with insurance or we can send. No answer will await response  ?

## 2021-10-25 NOTE — Telephone Encounter (Signed)
We can switch it to the weight loss version of Ozempic- [id number]- but she likely wants to call her insurance and see if it's covered and what the cost is. ?

## 2021-10-28 ENCOUNTER — Other Ambulatory Visit (HOSPITAL_BASED_OUTPATIENT_CLINIC_OR_DEPARTMENT_OTHER): Payer: Self-pay

## 2021-10-28 ENCOUNTER — Ambulatory Visit (HOSPITAL_BASED_OUTPATIENT_CLINIC_OR_DEPARTMENT_OTHER): Payer: 59 | Admitting: Physical Therapy

## 2021-10-28 ENCOUNTER — Encounter (HOSPITAL_BASED_OUTPATIENT_CLINIC_OR_DEPARTMENT_OTHER): Payer: Self-pay | Admitting: Physical Therapy

## 2021-10-28 DIAGNOSIS — M25511 Pain in right shoulder: Secondary | ICD-10-CM

## 2021-10-28 DIAGNOSIS — M6281 Muscle weakness (generalized): Secondary | ICD-10-CM

## 2021-10-28 DIAGNOSIS — R600 Localized edema: Secondary | ICD-10-CM

## 2021-10-28 DIAGNOSIS — M25611 Stiffness of right shoulder, not elsewhere classified: Secondary | ICD-10-CM

## 2021-10-28 NOTE — Therapy (Signed)
?OUTPATIENT PHYSICAL THERAPY SHOULDER TREATMENT ? ? ?Patient Name: Katelyn Reyes ?MRN: [id number] ?DOB:12-08-1960, 61 y.o., female ?Today's Date: 10/28/2021 ? ? PT End of Session - 10/28/21 1515   ? ? Visit Number 11   ? Number of Visits 25   ? Date for PT Re-Evaluation 12/02/21   ? Authorization Type [employer]   ? PT Start Time 1430   ? PT Stop Time 1500   ? PT Time Calculation (min) 30 min   ? Activity Tolerance Patient tolerated treatment well   ? Behavior During Therapy [facility] for tasks assessed/performed   ? ?  ?  ? ?  ? ? ? ? ? ? ? ? ? ? ? ?Past Medical History:  ?Diagnosis Date  ? Allergic rhinitis   ? Anxiety   ? Arthritis   ? Asthma   ? related to seasonal allergies  ? Breast calcification, right 02/02/2013  ? Surgically excised 02/17/13 > path benign   ? Cancer [facility])   ? Depression   ? Family history of breast cancer 04/12/2021  ? Family history of ovarian cancer 04/12/2021  ? GERD (gastroesophageal reflux disease)   ? Hyperlipidemia   ? IUD 02/07/2010  ? removed  ? Rotator cuff tear   ? right  ? ?Past Surgical History:  ?Procedure Laterality Date  ? BREAST BIOPSY Right 02/17/2013  ? Procedure:  NEEDLE LOCALIZATION REMOVAL RIGHT BREAST CALCIFICATIONS;  Surgeon: Haywood Lasso, MD;  Location: [place];  Service: General;  Laterality: Right;  ? BREAST EXCISIONAL BIOPSY Right pt unsure  ? benign  ? BREAST RECONSTRUCTION WITH PLACEMENT OF TISSUE EXPANDER AND ALLODERM Bilateral 04/26/2021  ? Procedure: BREAST RECONSTRUCTION WITH PLACEMENT OF TISSUE EXPANDER AND ALLODERM;  Surgeon: Irene Limbo, MD;  Location: [place];  Service: Plastics;  Laterality: Bilateral;  ? BURCH PROCEDURE N/A 12/22/2013  ? Procedure: BURCH CYSTO URETHROPEXY, ANTERIOR AND POSTERIOR CULPORRHAPHY;  Surgeon: Ena Dawley, MD;  Location: [place] [facility];  Service: Gynecology;  Laterality: N/A;  ? CERVICAL DISC ARTHROPLASTY N/A 11/12/2017  ? Procedure: ARTIFICIAL DISC REPLACEMENT CERVICAL SIX-SEVEN;   Surgeon: Ashok Pall, MD;  Location: [place];  Service: Neurosurgery;  Laterality: N/A;  ARTIFICIAL DISC REPLACEMENT CERVICAL 6- CERVICAL 7  ? CHOLECYSTECTOMY N/A 10/09/2017  ? Procedure: LAPAROSCOPIC CHOLECYSTECTOMY WITH INTRAOPERATIVE CHOLANGIOGRAM;  Surgeon: Judeth Horn, MD;  Location: [place];  Service: General;  Laterality: N/A;  ? KNEE ARTHROSCOPY WITH MEDIAL MENISECTOMY Right 05/01/2020  ? Procedure: KNEE ARTHROSCOPY WITH PARTIAL MEDIAL MENISECTOMY,   PATELLA  CHONDROPLASTY;  Surgeon: Paralee Cancel, MD;  Location: [facility];  Service: Orthopedics;  Laterality: Right;  ? LIPOSUCTION WITH LIPOFILLING Bilateral 07/23/2021  ? Procedure: LIPOFILLING FROM ABDOMEN TO BILATERAL CHEST;  Surgeon: Irene Limbo, MD;  Location: [place];  Service: Plastics;  Laterality: Bilateral;  ? MASTECTOMY W/ SENTINEL NODE BIOPSY Right 04/26/2021  ? Procedure: RIGHT MASTECTOMY WITH RIGHT SENTINEL LYMPH NODE BIOPSY;  Surgeon: [name], MD;  Location: [place];  Service: General;  Laterality: Right;  ? MOUTH SURGERY    ? NASAL SINUS SURGERY    ? REMOVAL OF BILATERAL TISSUE EXPANDERS WITH PLACEMENT OF BILATERAL BREAST IMPLANTS Bilateral 07/23/2021  ? Procedure: REMOVAL OF BILATERAL TISSUE EXPANDERS WITH PLACEMENT OF BILATERAL BREAST SILICONE IMPLANTS;  Surgeon: Irene Limbo, MD;  Location: [place];  Service: Plastics;  Laterality: Bilateral;  ? SHOULDER ARTHROSCOPY WITH ROTATOR CUFF REPAIR AND OPEN BICEPS TENODESIS Right 08/29/2021  ? Procedure:  RIGHT SHOULDER ARTHROSCOPY WITH ROTATOR CUFF REPAIR WITH PATCH AUGMENTATION AND  BICEPS TENODESIS;  Surgeon: Vanetta Mulders, MD;  Location: [place];  Service: Orthopedics;  Laterality: Right;  ? TOTAL MASTECTOMY Left 04/26/2021  ? Procedure: LEFT TOTAL MASTECTOMY;  Surgeon: [name], MD;  Location: [place];  Service: General;  Laterality: Left;  ? ?Patient Active Problem  List  ? Diagnosis Date Noted  ? Nontraumatic complete tear of right rotator cuff   ? Biceps tendinitis of right upper extremity   ? Breast cancer, right ([place]) 04/26/2021  ? Family history of breast cancer 04/12/2021  ? Malignant neoplasm of upper outer quadrant of female breast ([place]) 04/12/2021  ? Family history of colon cancer 04/12/2021  ? Family history of ovarian cancer 04/12/2021  ? Metabolic syndrome 40/81/4481  ? Diverticular disease of colon 07/17/2020  ? Family history of malignant neoplasm of gastrointestinal tract 07/17/2020  ? Fatty liver 07/17/2020  ? Nonalcoholic steatohepatitis (NASH) 07/17/2020  ? Personal history of colonic polyps 07/17/2020  ? Acute medial meniscal tear, right, subsequent encounter 05/01/2020  ? ETD (Eustachian tube dysfunction), bilateral 12/27/2019  ? Obesity (BMI 30-39.9) 07/12/2018  ? Cervical spondylosis with radiculopathy 11/12/2017  ? Vitamin D deficiency 07/07/2017  ? Urinary incontinence in female 12/22/2013  ? Asthma, mild intermittent 03/22/2013  ? Vaginal itching 06/01/2012  ? Thyromegaly 03/18/2012  ? General medical examination 03/14/2011  ? CHICKENPOX, HX OF 02/15/2010  ? Hyperlipidemia 02/14/2009  ? DEPRESSION 02/14/2009  ? ALLERGIC RHINITIS 02/14/2009  ? ? ?PCP: Midge Minium, MD ? ?REFERRING PROVIDER: Midge Minium, MD ? ?REFERRING DIAG:  ?M75.121 (ICD-10-CM) - Complete rotator cuff tear or rupture of right shoulder, not specified as traumatic  ?E56.314 (ICD-10-CM) - Biceps tendonosis of right shoulder  ? ? ?THERAPY DIAG:  ?Stiffness of right shoulder, not elsewhere classified ? ?Muscle weakness (generalized) ? ?Localized edema ? ?Acute pain of right shoulder ? ? ?ONSET DATE: 08/29/21 Surgery ? ? ?PROCEDURE: 1.  Right rotator cuff arthroscopic repair with collagen patch augmentation ?2.  Right biceps tenodesis ?3.  Right shoulder extensive debridement ?4.  Right shoulder acromioplasty with subacromial decompression ? ?SUBJECTIVE:                                                                                                                                                                                      ? ?SUBJECTIVE STATEMENT: ?Pt states she more sore today from doing more around the home and the new HEP. No pain.  ? ?PERTINENT HISTORY: ?Breast cancer, double mastectomy ? ?PAIN:  ?Are you having pain? Yes: NPRS scale: 6/10  ?Pain location: R shoulder, subscapular area ?  Pain description: soreness ?Aggravating factors: movement ?Relieving factors: icing, rest, meds ? ?PRECAUTIONS: Shoulder ? ?WEIGHT BEARING RESTRICTIONS Yes, NWB, RC repair protocol ? 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 ?PATIENT GOALS : return to work, be able to travel again ? ?OBJECTIVE:  ? ?DIAGNOSTIC FINDINGS:  ?IMPRESSION: ?1. Complete tear of the supraspinatus tendon with 4 cm of ?retraction. ?2. Severe tendinosis of the infraspinatus tendon with an ?interstitial tear. ?3. Mild tendinosis of the intra-articular portion of the long head ?of the biceps tendon. ? ?UPPER EXTREMITY ROM:  ?  ?AROM 5/11 PROM 5/11  ?Shoulder flexion 60 165  ?Shoulder extension     ?Shoulder abduction 75 150  ?Shoulder adduction     ?Shoulder internal rotation To belly belly  ?Shoulder external rotation 50  55  ?(Blank rows = not tested) ?  ?UPPER EXTREMITY MMT: ?  ?No appropriate to assess at this time. Able to demo at least 3-/5 in all planes ?  ?JOINT MOBILITY TESTING:  ?R GHJ stiffness into ant, post, and inf gliding ?  ?PALPATION:  ?No TTP around surgical sites ?                  ? ?TODAY'S TREATMENT:  ? ?PROM to tolerance ? ?Manual: grade II-III inf and post R GHJ gliding ? ? ?Home Exercises (not done today) ?- Seated Shoulder Flexion AAROM with Pulley Behind  - 2-3 x daily - 7 x weekly - 1 sets - 1 reps - 2 min hold ?- Seated Shoulder Abduction AAROM with Pulley Behind  - 2-3 x daily - 7 x weekly - 1 sets - 1 reps - 98mn hold ?- Standing Shoulder Row with Anchored Resistance  - 1 x daily -  7 x weekly - 3 sets - 10 reps ?- Prone Shoulder Extension - Single Arm  - 1 x daily - 7 x weekly - 3 sets - 10 reps ?- Standing Shoulder Internal Rotation Stretch with Towel  - 1 x daily - 7 x weekly

## 2021-10-31 ENCOUNTER — Ambulatory Visit (HOSPITAL_BASED_OUTPATIENT_CLINIC_OR_DEPARTMENT_OTHER): Payer: 59 | Admitting: Physical Therapy

## 2021-10-31 ENCOUNTER — Encounter (HOSPITAL_BASED_OUTPATIENT_CLINIC_OR_DEPARTMENT_OTHER): Payer: Self-pay | Admitting: Physical Therapy

## 2021-10-31 DIAGNOSIS — M25511 Pain in right shoulder: Secondary | ICD-10-CM

## 2021-10-31 DIAGNOSIS — M6281 Muscle weakness (generalized): Secondary | ICD-10-CM

## 2021-10-31 DIAGNOSIS — M25611 Stiffness of right shoulder, not elsewhere classified: Secondary | ICD-10-CM

## 2021-10-31 DIAGNOSIS — R600 Localized edema: Secondary | ICD-10-CM | POA: Diagnosis not present

## 2021-10-31 NOTE — Therapy (Signed)
OUTPATIENT PHYSICAL THERAPY SHOULDER TREATMENT   Patient Name: Katelyn Reyes MRN: [id number] DOB:March 13, 1961, 60 y.o., female Today's Date: 10/31/2021   PT End of Session - 10/31/21 1209     Visit Number 12    Number of Visits 25    Date for PT Re-Evaluation 12/02/21    Authorization Type [employer]    PT Start Time 1150    PT Stop Time 1230    PT Time Calculation (min) 40 min    Activity Tolerance Patient tolerated treatment well    Behavior During Therapy [facility] for tasks assessed/performed                       Past Medical History:  Diagnosis Date   Allergic rhinitis    Anxiety    Arthritis    Asthma    related to seasonal allergies   Breast calcification, right 02/02/2013   Surgically excised 02/17/13 > path benign    Cancer ([place])    Depression    Family history of breast cancer 04/12/2021   Family history of ovarian cancer 04/12/2021   GERD (gastroesophageal reflux disease)    Hyperlipidemia    IUD 02/07/2010   removed   Rotator cuff tear    right   Past Surgical History:  Procedure Laterality Date   BREAST BIOPSY Right 02/17/2013   Procedure:  NEEDLE LOCALIZATION REMOVAL RIGHT BREAST CALCIFICATIONS;  Surgeon: Haywood Lasso, MD;  Location: [place];  Service: General;  Laterality: Right;   BREAST EXCISIONAL BIOPSY Right pt unsure   benign   BREAST RECONSTRUCTION WITH PLACEMENT OF TISSUE EXPANDER AND ALLODERM Bilateral 04/26/2021   Procedure: BREAST RECONSTRUCTION WITH PLACEMENT OF TISSUE EXPANDER AND ALLODERM;  Surgeon: Irene Limbo, MD;  Location: [place];  Service: Plastics;  Laterality: Bilateral;   BURCH PROCEDURE N/A 12/22/2013   Procedure: BURCH CYSTO URETHROPEXY, ANTERIOR AND POSTERIOR CULPORRHAPHY;  Surgeon: Ena Dawley, MD;  Location: [place] [facility];  Service: Gynecology;  Laterality: N/A;   CERVICAL DISC ARTHROPLASTY N/A 11/12/2017   Procedure: ARTIFICIAL DISC REPLACEMENT CERVICAL SIX-SEVEN;   Surgeon: Ashok Pall, MD;  Location: [place];  Service: Neurosurgery;  Laterality: N/A;  ARTIFICIAL DISC REPLACEMENT CERVICAL 6- CERVICAL 7   CHOLECYSTECTOMY N/A 10/09/2017   Procedure: LAPAROSCOPIC CHOLECYSTECTOMY WITH INTRAOPERATIVE CHOLANGIOGRAM;  Surgeon: Judeth Horn, MD;  Location: [place];  Service: General;  Laterality: N/A;   KNEE ARTHROSCOPY WITH MEDIAL MENISECTOMY Right 05/01/2020   Procedure: KNEE ARTHROSCOPY WITH PARTIAL MEDIAL MENISECTOMY,   PATELLA  CHONDROPLASTY;  Surgeon: Paralee Cancel, MD;  Location: [facility];  Service: Orthopedics;  Laterality: Right;   LIPOSUCTION WITH LIPOFILLING Bilateral 07/23/2021   Procedure: LIPOFILLING FROM ABDOMEN TO BILATERAL CHEST;  Surgeon: Irene Limbo, MD;  Location: [place];  Service: Plastics;  Laterality: Bilateral;   MASTECTOMY W/ SENTINEL NODE BIOPSY Right 04/26/2021   Procedure: RIGHT MASTECTOMY WITH RIGHT SENTINEL LYMPH NODE BIOPSY;  Surgeon: [name], MD;  Location: [place];  Service: General;  Laterality: Right;   MOUTH SURGERY     NASAL SINUS SURGERY     REMOVAL OF BILATERAL TISSUE EXPANDERS WITH PLACEMENT OF BILATERAL BREAST IMPLANTS Bilateral 07/23/2021   Procedure: REMOVAL OF BILATERAL TISSUE EXPANDERS WITH PLACEMENT OF BILATERAL BREAST SILICONE IMPLANTS;  Surgeon: Irene Limbo, MD;  Location: [place];  Service: Plastics;  Laterality: Bilateral;   SHOULDER ARTHROSCOPY WITH ROTATOR CUFF REPAIR AND OPEN BICEPS TENODESIS Right 08/29/2021  Procedure: RIGHT SHOULDER ARTHROSCOPY WITH ROTATOR CUFF REPAIR WITH PATCH AUGMENTATION AND  BICEPS TENODESIS;  Surgeon: Vanetta Mulders, MD;  Location: [place];  Service: Orthopedics;  Laterality: Right;   TOTAL MASTECTOMY Left 04/26/2021   Procedure: LEFT TOTAL MASTECTOMY;  Surgeon: [name], MD;  Location: [place];  Service: General;  Laterality: Left;   Patient Active Problem  List   Diagnosis Date Noted   Nontraumatic complete tear of right rotator cuff    Biceps tendinitis of right upper extremity    Breast cancer, right ([place]) 04/26/2021   Family history of breast cancer 04/12/2021   Malignant neoplasm of upper outer quadrant of female breast ([place]) 04/12/2021   Family history of colon cancer 04/12/2021   Family history of ovarian cancer 14/43/1540   Metabolic syndrome 08/67/6195   Diverticular disease of colon 07/17/2020   Family history of malignant neoplasm of gastrointestinal tract 07/17/2020   Fatty liver 09/32/6712   Nonalcoholic steatohepatitis (NASH) 07/17/2020   Personal history of colonic polyps 07/17/2020   Acute medial meniscal tear, right, subsequent encounter 05/01/2020   ETD (Eustachian tube dysfunction), bilateral 12/27/2019   Obesity (BMI 30-39.9) 07/12/2018   Cervical spondylosis with radiculopathy 11/12/2017   Vitamin D deficiency 07/07/2017   Urinary incontinence in female 12/22/2013   Asthma, mild intermittent 03/22/2013   Vaginal itching 06/01/2012   Thyromegaly 03/18/2012   General medical examination 03/14/2011   CHICKENPOX, HX OF 02/15/2010   Hyperlipidemia 02/14/2009   DEPRESSION 02/14/2009   ALLERGIC RHINITIS 02/14/2009    PCP: Midge Minium, MD  REFERRING PROVIDER: Midge Minium, MD  REFERRING DIAG:  [phone] (ICD-10-CM) - Complete rotator cuff tear or rupture of right shoulder, not specified as traumatic  M67.813 (ICD-10-CM) - Biceps tendonosis of right shoulder    THERAPY DIAG:  Stiffness of right shoulder, not elsewhere classified  Muscle weakness (generalized)  Localized edema  Acute pain of right shoulder   ONSET DATE: 08/29/21 Surgery   PROCEDURE: 1.  Right rotator cuff arthroscopic repair with collagen patch augmentation 2.  Right biceps tenodesis 3.  Right shoulder extensive debridement 4.  Right shoulder acromioplasty with subacromial decompression  SUBJECTIVE:                                                                                                                                                                                       SUBJECTIVE STATEMENT: Pt states she took pain meds and NSAIDs that helped with the soreness. She was not really painful today.   PERTINENT HISTORY: Breast cancer, double mastectomy  PAIN:  Are you having pain? 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 Complete tear of the supraspinatus tendon with 4 cm of retraction. 2. Severe tendinosis of the infraspinatus tendon with an interstitial tear. 3. Mild tendinosis of the intra-articular portion of the long head of the biceps tendon.  UPPER EXTREMITY ROM:    AROM 5/11 PROM 5/11  Shoulder flexion 60 165  Shoulder extension     Shoulder abduction 75 150  Shoulder adduction     Shoulder internal rotation To belly belly  Shoulder external rotation 50  55  (Blank rows = not tested)   UPPER EXTREMITY MMT:   No appropriate to assess at this time. Able to demo at least 3-/5 in all planes   JOINT MOBILITY TESTING:  R GHJ stiffness into ant, post, and inf gliding   PALPATION:  No TTP around surgical sites                    TODAY'S TREATMENT:   OH pulley's 2 min ABD and Flexion   Home Exercises (not done today) - Seated Shoulder Flexion AAROM with Pulley Behind  - 2-3 x daily - 7 x weekly - 1 sets - 1 reps - 2 min hold - Seated Shoulder Abduction AAROM with Pulley Behind  - 2-3 x daily - 7 x weekly - 1 sets - 1 reps - 67mn hold - Standing Shoulder Row with Anchored Resistance  - 1 x daily - 7 x weekly - 3  sets - 10 reps - Prone Shoulder Extension - Single Arm  - 1 x daily - 7 x weekly - 3 sets - 10 reps - Standing Shoulder Internal Rotation Stretch with Towel  - 1 x daily - 7 x weekly - 1 sets - 10 reps - 5 hold - Sidelying Shoulder External Rotation  - 1 x daily - 7 x weekly - 1 sets - 10 reps -Supine AAROM protraction 2x10 -prone row 2x10  Manual therapy: R GHJ post and inf mob grade II-III STM R deltoid and biceps   PATIENT EDUCATION: Education details: precautions/protocol, edema management, joint protection, exercise progression, muscle firing,  HEP,    Person educated: Patient Education method: Explanation, Demonstration, Tactile cues, Verbal cues, and Handouts Education comprehension: verbalized understanding, returned demonstration, verbal cues required, and tactile cues required   HOME EXERCISE PROGRAM: Access Code: [email or web address] URL: [email or web address] Date: 10/17/2021 Prepared by: ADaleen Bo ASSESSMENT:  CLINICAL IMPRESSION: Pt with much better controlled pain and soreness at today's session and was able to reach near end range flexion and ABD today with AAROM. Pt able to continue with previous ant-gravity exercise without pain and able to introduce new scapular exercise. Plan to continue with A/AAROM as tolerated at next session. Pt has 5lb lifting restriction while at work and is not able to perform pulling movements at this time, per MD. Pt would benefit from continued skilled therapy in order to reach goals and maximize functional R UE strength and ROM for full return to PLOF.     OBJECTIVE IMPAIRMENTS decreased ROM, decreased strength, hypomobility, increased fascial restrictions, increased muscle spasms, impaired flexibility, impaired UE functional use, improper body mechanics, postural dysfunction, and pain.   ACTIVITY LIMITATIONS cleaning, community activity, driving, meal  prep, occupation, laundry, medication management, yard work, shopping, and  exercise .   PERSONAL FACTORS Age, Behavior pattern, Fitness, Time since onset of injury/illness/exacerbation, and 1-2 comorbidities:    are also affecting patient's functional outcome.    REHAB POTENTIAL: Good  CLINICAL DECISION MAKING: Stable/uncomplicated  EVALUATION COMPLEXITY: Low   GOALS:   SHORT TERM GOALS: Target date: 12/12/2021  Pt will become independent with HEP in order to demonstrate synthesis of PT education.   Goal status: INITIAL  2.  Pt will be able to demonstrate PROM to protocol limits in order to demonstrate functional improvement in R UE function for progression to next phase of protocol.   Goal status: INITIAL  3.  Pt will be able to demonstrate AAROM without pain in order to demonstrate functional improvement in UE function for self-care and house hold duties.   Goal status: INITIAL  LONG TERM GOALS: Target date: 01/23/2022  Pt  will become independent with final HEP in order to demonstrate synthesis of PT education.   Goal status: INITIAL  2.  Pt will be able to demonstrate full A/PROM in order to demonstrate functional improvement in UE function for self-care and house hold duties.   Goal status: INITIAL  3.  Pt will be able to demonstrate ability to reach and set 1lb object onto shelf in order to demonstrate functional improvement in UE function for self-care and house hold duties.   Goal status: INITIAL  4.  Pt will be able to reach [facility] and carry/hold >5 lbs in order to demonstrate functional improvement in R UE strength for return to PLOF and exercise.   Goal status: INITIAL   PLAN: PT FREQUENCY: 1-2x/week  PT DURATION: 12 weeks   PLANNED INTERVENTIONS: Therapeutic exercises, Therapeutic activity, Neuromuscular re-education, Balance training, Gait training, Patient/Family education, Joint manipulation, Joint mobilization, Aquatic Therapy, Dry Needling, Electrical stimulation, Spinal manipulation, Spinal mobilization, Cryotherapy,  Moist heat, scar mobilization, Taping, Vasopneumatic device, Traction, Ultrasound, Ionotophoresis '4mg'$ /ml Dexamethasone, and Manual therapy  PLAN FOR NEXT SESSION: Progress strengthening and ROM per protocol   Daleen Bo, PT 10/31/2021, 12:45 PM

## 2021-11-04 ENCOUNTER — Encounter (HOSPITAL_BASED_OUTPATIENT_CLINIC_OR_DEPARTMENT_OTHER): Payer: Self-pay | Admitting: Physical Therapy

## 2021-11-04 ENCOUNTER — Encounter (HOSPITAL_BASED_OUTPATIENT_CLINIC_OR_DEPARTMENT_OTHER): Payer: 59 | Admitting: Physical Therapy

## 2021-11-04 ENCOUNTER — Ambulatory Visit (HOSPITAL_BASED_OUTPATIENT_CLINIC_OR_DEPARTMENT_OTHER): Payer: 59 | Admitting: Physical Therapy

## 2021-11-04 DIAGNOSIS — M25611 Stiffness of right shoulder, not elsewhere classified: Secondary | ICD-10-CM

## 2021-11-04 DIAGNOSIS — M25511 Pain in right shoulder: Secondary | ICD-10-CM | POA: Diagnosis not present

## 2021-11-04 DIAGNOSIS — R600 Localized edema: Secondary | ICD-10-CM | POA: Diagnosis not present

## 2021-11-04 DIAGNOSIS — M6281 Muscle weakness (generalized): Secondary | ICD-10-CM

## 2021-11-04 NOTE — Therapy (Signed)
OUTPATIENT PHYSICAL THERAPY SHOULDER TREATMENT   Patient Name: Katelyn Reyes MRN: [id number] DOB:08/24/1960, 61 y.o., female Today's Date: 11/04/2021   PT End of Session - 11/04/21 1102     Visit Number 13    Number of Visits 25    Date for PT Re-Evaluation 12/02/21    Authorization Type [employer]    PT Start Time 1105    PT Stop Time 1143    PT Time Calculation (min) 38 min    Activity Tolerance Patient tolerated treatment well    Behavior During Therapy [facility] for tasks assessed/performed                        Past Medical History:  Diagnosis Date   Allergic rhinitis    Anxiety    Arthritis    Asthma    related to seasonal allergies   Breast calcification, right 02/02/2013   Surgically excised 02/17/13 > path benign    Cancer ([place])    Depression    Family history of breast cancer 04/12/2021   Family history of ovarian cancer 04/12/2021   GERD (gastroesophageal reflux disease)    Hyperlipidemia    IUD 02/07/2010   removed   Rotator cuff tear    right   Past Surgical History:  Procedure Laterality Date   BREAST BIOPSY Right 02/17/2013   Procedure:  NEEDLE LOCALIZATION REMOVAL RIGHT BREAST CALCIFICATIONS;  Surgeon: Haywood Lasso, MD;  Location: [place];  Service: General;  Laterality: Right;   BREAST EXCISIONAL BIOPSY Right pt unsure   benign   BREAST RECONSTRUCTION WITH PLACEMENT OF TISSUE EXPANDER AND ALLODERM Bilateral 04/26/2021   Procedure: BREAST RECONSTRUCTION WITH PLACEMENT OF TISSUE EXPANDER AND ALLODERM;  Surgeon: Irene Limbo, MD;  Location: [place];  Service: Plastics;  Laterality: Bilateral;   BURCH PROCEDURE N/A 12/22/2013   Procedure: BURCH CYSTO URETHROPEXY, ANTERIOR AND POSTERIOR CULPORRHAPHY;  Surgeon: Ena Dawley, MD;  Location: [facility];  Service: Gynecology;  Laterality: N/A;   CERVICAL DISC ARTHROPLASTY N/A 11/12/2017   Procedure: ARTIFICIAL DISC REPLACEMENT CERVICAL SIX-SEVEN;   Surgeon: Ashok Pall, MD;  Location: [place];  Service: Neurosurgery;  Laterality: N/A;  ARTIFICIAL DISC REPLACEMENT CERVICAL 6- CERVICAL 7   CHOLECYSTECTOMY N/A 10/09/2017   Procedure: LAPAROSCOPIC CHOLECYSTECTOMY WITH INTRAOPERATIVE CHOLANGIOGRAM;  Surgeon: Judeth Horn, MD;  Location: [place];  Service: General;  Laterality: N/A;   KNEE ARTHROSCOPY WITH MEDIAL MENISECTOMY Right 05/01/2020   Procedure: KNEE ARTHROSCOPY WITH PARTIAL MEDIAL MENISECTOMY,   PATELLA  CHONDROPLASTY;  Surgeon: Paralee Cancel, MD;  Location: [facility];  Service: Orthopedics;  Laterality: Right;   LIPOSUCTION WITH LIPOFILLING Bilateral 07/23/2021   Procedure: LIPOFILLING FROM ABDOMEN TO BILATERAL CHEST;  Surgeon: Irene Limbo, MD;  Location: [place];  Service: Plastics;  Laterality: Bilateral;   MASTECTOMY W/ SENTINEL NODE BIOPSY Right 04/26/2021   Procedure: RIGHT MASTECTOMY WITH RIGHT SENTINEL LYMPH NODE BIOPSY;  Surgeon: [name], MD;  Location: [place];  Service: General;  Laterality: Right;   MOUTH SURGERY     NASAL SINUS SURGERY     REMOVAL OF BILATERAL TISSUE EXPANDERS WITH PLACEMENT OF BILATERAL BREAST IMPLANTS Bilateral 07/23/2021   Procedure: REMOVAL OF BILATERAL TISSUE EXPANDERS WITH PLACEMENT OF BILATERAL BREAST SILICONE IMPLANTS;  Surgeon: Irene Limbo, MD;  Location: [place];  Service: Plastics;  Laterality: Bilateral;   SHOULDER ARTHROSCOPY WITH ROTATOR CUFF REPAIR AND OPEN BICEPS TENODESIS Right 08/29/2021  Procedure: RIGHT SHOULDER ARTHROSCOPY WITH ROTATOR CUFF REPAIR WITH PATCH AUGMENTATION AND  BICEPS TENODESIS;  Surgeon: Vanetta Mulders, MD;  Location: [place];  Service: Orthopedics;  Laterality: Right;   TOTAL MASTECTOMY Left 04/26/2021   Procedure: LEFT TOTAL MASTECTOMY;  Surgeon: [name], MD;  Location: [place];  Service: General;  Laterality: Left;   Patient Active  Problem List   Diagnosis Date Noted   Nontraumatic complete tear of right rotator cuff    Biceps tendinitis of right upper extremity    Breast cancer, right ([place]) 04/26/2021   Family history of breast cancer 04/12/2021   Malignant neoplasm of upper outer quadrant of female breast ([place]) 04/12/2021   Family history of colon cancer 04/12/2021   Family history of ovarian cancer 82/42/3536   Metabolic syndrome 14/43/1540   Diverticular disease of colon 07/17/2020   Family history of malignant neoplasm of gastrointestinal tract 07/17/2020   Fatty liver 08/67/6195   Nonalcoholic steatohepatitis (NASH) 07/17/2020   Personal history of colonic polyps 07/17/2020   Acute medial meniscal tear, right, subsequent encounter 05/01/2020   ETD (Eustachian tube dysfunction), bilateral 12/27/2019   Obesity (BMI 30-39.9) 07/12/2018   Cervical spondylosis with radiculopathy 11/12/2017   Vitamin D deficiency 07/07/2017   Urinary incontinence in female 12/22/2013   Asthma, mild intermittent 03/22/2013   Vaginal itching 06/01/2012   Thyromegaly 03/18/2012   General medical examination 03/14/2011   CHICKENPOX, HX OF 02/15/2010   Hyperlipidemia 02/14/2009   DEPRESSION 02/14/2009   ALLERGIC RHINITIS 02/14/2009    PCP: Midge Minium, MD  REFERRING PROVIDER: Midge Minium, MD  REFERRING DIAG:  [phone] (ICD-10-CM) - Complete rotator cuff tear or rupture of right shoulder, not specified as traumatic  M67.813 (ICD-10-CM) - Biceps tendonosis of right shoulder    THERAPY DIAG:  Stiffness of right shoulder, not elsewhere classified  Muscle weakness (generalized)  Localized edema  Acute pain of right shoulder   ONSET DATE: 08/29/21 Surgery   PROCEDURE: 1.  Right rotator cuff arthroscopic repair with collagen patch augmentation 2.  Right biceps tenodesis 3.  Right shoulder extensive debridement 4.  Right shoulder acromioplasty with subacromial decompression  SUBJECTIVE:                                                                                                                                                                                       SUBJECTIVE STATEMENT: Pt states that she was not excessively sore after last session. Report  PERTINENT HISTORY: Breast cancer, double mastectomy  PAIN:  Are you having pain? 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 Complete tear of the supraspinatus tendon with 4 cm of retraction. 2. Severe tendinosis of the infraspinatus tendon with an interstitial tear. 3. Mild tendinosis of the intra-articular portion of the long head of the biceps tendon.   TODAY'S TREATMENT:   OH pulley's 2 min ABD and Flexion   -standing row 2x10 RTB -standing extension RTB 2x10 -Supine AAROM protraction 3x10 -prone row 1lb 3x10 -prone extension 1lb 2x10 -prone T 2x10  Manual therapy: R GHJ post and inf mob grade II-III    PATIENT EDUCATION: Education details: precautions/protocol, edema management, joint protection, exercise progression, muscle firing,  HEP,    Person educated: Patient Education method: Explanation, Demonstration, Tactile cues, Verbal cues, and Handouts Education comprehension: verbalized understanding, returned demonstration, verbal cues required, and tactile cues required   HOME EXERCISE PROGRAM: Access Code: [email or web address] URL: [email or web address] Date: 11/04/2021 Prepared by: Daleen Bo  Exercises - Seated Shoulder Flexion AAROM with Pulley Behind  - 2-3 x daily - 7 x weekly - 1 sets - 1 reps - 2 min hold - Seated Shoulder  Abduction AAROM with Pulley Behind  - 2-3 x daily - 7 x weekly - 1 sets - 1 reps - 77mn hold - Standing Shoulder Internal Rotation Stretch with Towel  - 1 x daily - 7 x weekly - 1 sets - 10 reps - 5 hold - Standing Shoulder Row with Anchored Resistance  - 1 x daily - 3-4 x weekly - 3 sets - 10 reps - Prone Shoulder Extension - Single Arm  - 1 x daily - 3-4 x weekly - 3 sets - 10 reps - Sidelying Shoulder External Rotation  - 1 x daily - 3-4 x weekly - 1 sets - 10 reps - Standing Shoulder Scaption  - 1 x daily - 3-4 x weekly - 3 sets - 10 reps  ASSESSMENT:  CLINICAL IMPRESSION: Pt able to continue today with AAROM and anti-gravity motions without increasing pain. Pt still with good ROM with minor tightness at end range. Pt HEP updated today with more shoulder strengthening exercise. Plan to continue with AROM as tolerated. Pt would benefit from continued skilled therapy in order to reach goals and maximize functional R UE strength and ROM for full return to PLOF.     OBJECTIVE IMPAIRMENTS decreased ROM, decreased strength, hypomobility, increased fascial restrictions, increased muscle spasms, impaired flexibility, impaired UE functional use, improper body mechanics, postural dysfunction, and pain.   ACTIVITY LIMITATIONS cleaning, community activity, driving, meal prep, occupation, laundry, medication management, yard work, shopping, and exercise .   PERSONAL FACTORS Age, Behavior pattern, Fitness, Time since onset of injury/illness/exacerbation, and 1-2 comorbidities:    are also affecting patient's functional outcome.    REHAB POTENTIAL: Good  CLINICAL DECISION MAKING: Stable/uncomplicated  EVALUATION COMPLEXITY: Low   GOALS:   SHORT TERM GOALS: Target date: 12/16/2021  Pt will become independent with HEP in order to demonstrate synthesis of PT education.   Goal status: INITIAL  2.  Pt will be able to demonstrate PROM to protocol limits in order to demonstrate functional  improvement in R UE function for progression to next phase of protocol.   Goal status: INITIAL  3.  Pt will be able to demonstrate AAROM without pain  in order to demonstrate functional improvement in UE function for self-care and house hold duties.   Goal status: INITIAL  LONG TERM GOALS: Target date: 01/27/2022  Pt  will become independent with final HEP in order to demonstrate synthesis of PT education.   Goal status: INITIAL  2.  Pt will be able to demonstrate full A/PROM in order to demonstrate functional improvement in UE function for self-care and house hold duties.   Goal status: INITIAL  3.  Pt will be able to demonstrate ability to reach and set 1lb object onto shelf in order to demonstrate functional improvement in UE function for self-care and house hold duties.   Goal status: INITIAL  4.  Pt will be able to reach [facility] and carry/hold >5 lbs in order to demonstrate functional improvement in R UE strength for return to PLOF and exercise.   Goal status: INITIAL   PLAN: PT FREQUENCY: 1-2x/week  PT DURATION: 12 weeks   PLANNED INTERVENTIONS: Therapeutic exercises, Therapeutic activity, Neuromuscular re-education, Balance training, Gait training, Patient/Family education, Joint manipulation, Joint mobilization, Aquatic Therapy, Dry Needling, Electrical stimulation, Spinal manipulation, Spinal mobilization, Cryotherapy, Moist heat, scar mobilization, Taping, Vasopneumatic device, Traction, Ultrasound, Ionotophoresis '4mg'$ /ml Dexamethasone, and Manual therapy  PLAN FOR NEXT SESSION: Progress strengthening and ROM per protocol   Daleen Bo, PT 11/04/2021, 11:45 AM

## 2021-11-07 ENCOUNTER — Encounter (HOSPITAL_BASED_OUTPATIENT_CLINIC_OR_DEPARTMENT_OTHER): Payer: Self-pay | Admitting: Physical Therapy

## 2021-11-07 ENCOUNTER — Ambulatory Visit (HOSPITAL_BASED_OUTPATIENT_CLINIC_OR_DEPARTMENT_OTHER): Payer: 59 | Admitting: Physical Therapy

## 2021-11-07 DIAGNOSIS — M25511 Pain in right shoulder: Secondary | ICD-10-CM

## 2021-11-07 DIAGNOSIS — M25611 Stiffness of right shoulder, not elsewhere classified: Secondary | ICD-10-CM

## 2021-11-07 DIAGNOSIS — M6281 Muscle weakness (generalized): Secondary | ICD-10-CM

## 2021-11-07 DIAGNOSIS — R600 Localized edema: Secondary | ICD-10-CM | POA: Diagnosis not present

## 2021-11-07 NOTE — Therapy (Signed)
OUTPATIENT PHYSICAL THERAPY SHOULDER TREATMENT   Patient Name: Katelyn Reyes MRN: [id number] DOB:June 21, 1960, 61 y.o., female Today's Date: 11/07/2021   PT End of Session - 11/07/21 1058     Visit Number 14    Number of Visits 25    Date for PT Re-Evaluation 12/02/21    Authorization Type [employer]    PT Start Time 1100    PT Stop Time 1140    PT Time Calculation (min) 40 min    Activity Tolerance Patient tolerated treatment well    Behavior During Therapy [facility] for tasks assessed/performed                        Past Medical History:  Diagnosis Date   Allergic rhinitis    Anxiety    Arthritis    Asthma    related to seasonal allergies   Breast calcification, right 02/02/2013   Surgically excised 02/17/13 > path benign    Cancer ([place])    Depression    Family history of breast cancer 04/12/2021   Family history of ovarian cancer 04/12/2021   GERD (gastroesophageal reflux disease)    Hyperlipidemia    IUD 02/07/2010   removed   Rotator cuff tear    right   Past Surgical History:  Procedure Laterality Date   BREAST BIOPSY Right 02/17/2013   Procedure:  NEEDLE LOCALIZATION REMOVAL RIGHT BREAST CALCIFICATIONS;  Surgeon: Haywood Lasso, MD;  Location: [place];  Service: General;  Laterality: Right;   BREAST EXCISIONAL BIOPSY Right pt unsure   benign   BREAST RECONSTRUCTION WITH PLACEMENT OF TISSUE EXPANDER AND ALLODERM Bilateral 04/26/2021   Procedure: BREAST RECONSTRUCTION WITH PLACEMENT OF TISSUE EXPANDER AND ALLODERM;  Surgeon: Irene Limbo, MD;  Location: [place];  Service: Plastics;  Laterality: Bilateral;   BURCH PROCEDURE N/A 12/22/2013   Procedure: BURCH CYSTO URETHROPEXY, ANTERIOR AND POSTERIOR CULPORRHAPHY;  Surgeon: Ena Dawley, MD;  Location: [facility];  Service: Gynecology;  Laterality: N/A;   CERVICAL DISC ARTHROPLASTY N/A 11/12/2017   Procedure: ARTIFICIAL DISC REPLACEMENT CERVICAL SIX-SEVEN;   Surgeon: Ashok Pall, MD;  Location: [place];  Service: Neurosurgery;  Laterality: N/A;  ARTIFICIAL DISC REPLACEMENT CERVICAL 6- CERVICAL 7   CHOLECYSTECTOMY N/A 10/09/2017   Procedure: LAPAROSCOPIC CHOLECYSTECTOMY WITH INTRAOPERATIVE CHOLANGIOGRAM;  Surgeon: Judeth Horn, MD;  Location: [place];  Service: General;  Laterality: N/A;   KNEE ARTHROSCOPY WITH MEDIAL MENISECTOMY Right 05/01/2020   Procedure: KNEE ARTHROSCOPY WITH PARTIAL MEDIAL MENISECTOMY,   PATELLA  CHONDROPLASTY;  Surgeon: Paralee Cancel, MD;  Location: [facility];  Service: Orthopedics;  Laterality: Right;   LIPOSUCTION WITH LIPOFILLING Bilateral 07/23/2021   Procedure: LIPOFILLING FROM ABDOMEN TO BILATERAL CHEST;  Surgeon: Irene Limbo, MD;  Location: [place];  Service: Plastics;  Laterality: Bilateral;   MASTECTOMY W/ SENTINEL NODE BIOPSY Right 04/26/2021   Procedure: RIGHT MASTECTOMY WITH RIGHT SENTINEL LYMPH NODE BIOPSY;  Surgeon: [name], MD;  Location: [place];  Service: General;  Laterality: Right;   MOUTH SURGERY     NASAL SINUS SURGERY     REMOVAL OF BILATERAL TISSUE EXPANDERS WITH PLACEMENT OF BILATERAL BREAST IMPLANTS Bilateral 07/23/2021   Procedure: REMOVAL OF BILATERAL TISSUE EXPANDERS WITH PLACEMENT OF BILATERAL BREAST SILICONE IMPLANTS;  Surgeon: Irene Limbo, MD;  Location: [place];  Service: Plastics;  Laterality: Bilateral;   SHOULDER ARTHROSCOPY WITH ROTATOR CUFF REPAIR AND OPEN BICEPS TENODESIS Right 08/29/2021  Procedure: RIGHT SHOULDER ARTHROSCOPY WITH ROTATOR CUFF REPAIR WITH PATCH AUGMENTATION AND  BICEPS TENODESIS;  Surgeon: Vanetta Mulders, MD;  Location: [place];  Service: Orthopedics;  Laterality: Right;   TOTAL MASTECTOMY Left 04/26/2021   Procedure: LEFT TOTAL MASTECTOMY;  Surgeon: [name], MD;  Location: [place];  Service: General;  Laterality: Left;   Patient Active  Problem List   Diagnosis Date Noted   Nontraumatic complete tear of right rotator cuff    Biceps tendinitis of right upper extremity    Breast cancer, right ([place]) 04/26/2021   Family history of breast cancer 04/12/2021   Malignant neoplasm of upper outer quadrant of female breast ([place]) 04/12/2021   Family history of colon cancer 04/12/2021   Family history of ovarian cancer 09/62/8366   Metabolic syndrome 29/47/6546   Diverticular disease of colon 07/17/2020   Family history of malignant neoplasm of gastrointestinal tract 07/17/2020   Fatty liver 50/35/4656   Nonalcoholic steatohepatitis (NASH) 07/17/2020   Personal history of colonic polyps 07/17/2020   Acute medial meniscal tear, right, subsequent encounter 05/01/2020   ETD (Eustachian tube dysfunction), bilateral 12/27/2019   Obesity (BMI 30-39.9) 07/12/2018   Cervical spondylosis with radiculopathy 11/12/2017   Vitamin D deficiency 07/07/2017   Urinary incontinence in female 12/22/2013   Asthma, mild intermittent 03/22/2013   Vaginal itching 06/01/2012   Thyromegaly 03/18/2012   General medical examination 03/14/2011   CHICKENPOX, HX OF 02/15/2010   Hyperlipidemia 02/14/2009   DEPRESSION 02/14/2009   ALLERGIC RHINITIS 02/14/2009    PCP: Midge Minium, MD  REFERRING PROVIDER: Midge Minium, MD  REFERRING DIAG:  [phone] (ICD-10-CM) - Complete rotator cuff tear or rupture of right shoulder, not specified as traumatic  M67.813 (ICD-10-CM) - Biceps tendonosis of right shoulder    THERAPY DIAG:  Muscle weakness (generalized)  Stiffness of right shoulder, not elsewhere classified  Localized edema  Acute pain of right shoulder   ONSET DATE: 08/29/21 Surgery   PROCEDURE: 1.  Right rotator cuff arthroscopic repair with collagen patch augmentation 2.  Right biceps tenodesis 3.  Right shoulder extensive debridement 4.  Right shoulder acromioplasty with subacromial decompression  SUBJECTIVE:                                                                                                                                                                                       SUBJECTIVE STATEMENT: Pt states that she was not excessively sore after last session. Pt is 10 wks at this time.    PERTINENT HISTORY: Breast cancer, double mastectomy  PAIN:  Are you having pain? 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 Complete tear of the supraspinatus tendon with 4 cm of retraction. 2. Severe tendinosis of the infraspinatus tendon with an interstitial tear. 3. Mild tendinosis of the intra-articular portion of the long head of the biceps tendon.   TODAY'S TREATMENT:   OH pulley's 2 min ABD and Flexion  -S/L ABD 4x5 -S/L flexion 3x5 -Supine AAROM protraction 3x10 Supine A/AROM flexion 2x10 -prone row 1lb 3x10 -prone T 3x10 -S/L 1lb ER 3x10 -standing IR RTB 3x10  Manual therapy: R GHJ post and inf mob grade II-III    PATIENT EDUCATION: Education details: precautions/protocol, edema management, joint protection, exercise progression, muscle firing,  HEP,    Person educated: Patient Education method: Explanation, Demonstration, Tactile cues, Verbal cues, and Handouts Education comprehension: verbalized understanding, returned demonstration, verbal cues required, and tactile cues required   HOME EXERCISE PROGRAM: Access Code: [email or web address] URL: [email or web address] Date: 11/04/2021 Prepared by: Daleen Bo  Exercises - Seated Shoulder Flexion AAROM with Pulley Behind  - 2-3 x daily - 7 x weekly - 1 sets - 1  reps - 2 min hold - Seated Shoulder Abduction AAROM with Pulley Behind  - 2-3 x daily - 7 x weekly - 1 sets - 1 reps - 72mn hold - Standing Shoulder Internal Rotation Stretch with Towel  - 1 x daily - 7 x weekly - 1 sets - 10 reps - 5 hold - Standing Shoulder Row with Anchored Resistance  - 1 x daily - 3-4 x weekly - 3 sets - 10 reps - Prone Shoulder Extension - Single Arm  - 1 x daily - 3-4 x weekly - 3 sets - 10 reps - Sidelying Shoulder External Rotation  - 1 x daily - 3-4 x weekly - 1 sets - 10 reps - Standing Shoulder Scaption  - 1 x daily - 3-4 x weekly - 3 sets - 10 reps  ASSESSMENT:  CLINICAL IMPRESSION: Pt able to continue with increasing volume and intensity of R shoulder loading at today's session without increasing pain. Pt does have increased difficulty with protraction and flexion, requiring AAROM for the first set before transitioning to full AROM. Pt unable to perform full AROM ABD against gravity at this time. Plan to assess for reaction to today's session before progressing HEP at next. Pt is continuing to improve with R shoulder motion as expected at this time. Pt would benefit from continued skilled therapy in order to reach goals and maximize functional R UE strength and ROM for full return to PLOF.     OBJECTIVE IMPAIRMENTS decreased ROM, decreased strength, hypomobility, increased fascial restrictions, increased muscle spasms, impaired flexibility, impaired UE functional use, improper body mechanics, postural dysfunction, and pain.   ACTIVITY LIMITATIONS cleaning, community activity, driving, meal prep, occupation, laundry, medication management, yard work, shopping, and exercise .   PERSONAL FACTORS Age, Behavior pattern, Fitness, Time since onset of injury/illness/exacerbation, and 1-2 comorbidities:    are also affecting patient's functional outcome.    REHAB POTENTIAL: Good  CLINICAL DECISION MAKING: Stable/uncomplicated  EVALUATION COMPLEXITY:  Low   GOALS:   SHORT TERM GOALS: Target date: 12/19/2021  Pt will become independent with HEP in order to demonstrate synthesis of PT education.  Goal status: INITIAL  2.  Pt will be able to demonstrate PROM to protocol limits in order to demonstrate functional improvement in R UE function for progression to next phase of protocol.   Goal status: INITIAL  3.  Pt will be able to demonstrate AAROM without pain in order to demonstrate functional improvement in UE function for self-care and house hold duties.   Goal status: INITIAL  LONG TERM GOALS: Target date: 01/30/2022  Pt  will become independent with final HEP in order to demonstrate synthesis of PT education.   Goal status: INITIAL  2.  Pt will be able to demonstrate full A/PROM in order to demonstrate functional improvement in UE function for self-care and house hold duties.   Goal status: INITIAL  3.  Pt will be able to demonstrate ability to reach and set 1lb object onto shelf in order to demonstrate functional improvement in UE function for self-care and house hold duties.   Goal status: INITIAL  4.  Pt will be able to reach [facility] and carry/hold >5 lbs in order to demonstrate functional improvement in R UE strength for return to PLOF and exercise.   Goal status: INITIAL   PLAN: PT FREQUENCY: 1-2x/week  PT DURATION: 12 weeks   PLANNED INTERVENTIONS: Therapeutic exercises, Therapeutic activity, Neuromuscular re-education, Balance training, Gait training, Patient/Family education, Joint manipulation, Joint mobilization, Aquatic Therapy, Dry Needling, Electrical stimulation, Spinal manipulation, Spinal mobilization, Cryotherapy, Moist heat, scar mobilization, Taping, Vasopneumatic device, Traction, Ultrasound, Ionotophoresis '4mg'$ /ml Dexamethasone, and Manual therapy  PLAN FOR NEXT SESSION: Progress strengthening and ROM per protocol   Daleen Bo, PT 11/07/2021, 11:42 AM

## 2021-11-12 ENCOUNTER — Telehealth: Payer: Self-pay | Admitting: Family Medicine

## 2021-11-12 ENCOUNTER — Telehealth: Payer: Self-pay | Admitting: Orthopaedic Surgery

## 2021-11-12 ENCOUNTER — Encounter (HOSPITAL_BASED_OUTPATIENT_CLINIC_OR_DEPARTMENT_OTHER): Payer: Self-pay | Admitting: Physical Therapy

## 2021-11-12 ENCOUNTER — Ambulatory Visit (HOSPITAL_BASED_OUTPATIENT_CLINIC_OR_DEPARTMENT_OTHER): Payer: 59 | Admitting: Physical Therapy

## 2021-11-12 DIAGNOSIS — M25511 Pain in right shoulder: Secondary | ICD-10-CM

## 2021-11-12 DIAGNOSIS — M25611 Stiffness of right shoulder, not elsewhere classified: Secondary | ICD-10-CM

## 2021-11-12 DIAGNOSIS — R600 Localized edema: Secondary | ICD-10-CM | POA: Diagnosis not present

## 2021-11-12 DIAGNOSIS — M6281 Muscle weakness (generalized): Secondary | ICD-10-CM | POA: Diagnosis not present

## 2021-11-12 NOTE — Telephone Encounter (Signed)
Pt may need PA will need to check status

## 2021-11-12 NOTE — Telephone Encounter (Signed)
Pt is stating that her insurance denied to refill her Ozempic. PT states she has been on this medication about year now. Do I need to schedule her appointment?

## 2021-11-12 NOTE — Telephone Encounter (Signed)
Please advise the duration of the work restrictions per the 5/11 work note. Thanks

## 2021-11-12 NOTE — Therapy (Signed)
OUTPATIENT PHYSICAL THERAPY SHOULDER TREATMENT   Patient Name: Katelyn Reyes MRN: [id number] DOB:Jan 10, 1961, 61 y.o., female Today's Date: 11/12/2021   PT End of Session - 11/12/21 1105     Visit Number 15    Number of Visits 25    Date for PT Re-Evaluation 12/02/21    Authorization Type [employer]    PT Start Time 9924    PT Stop Time 1141    PT Time Calculation (min) 39 min    Activity Tolerance Patient tolerated treatment well    Behavior During Therapy [facility] for tasks assessed/performed                         Past Medical History:  Diagnosis Date   Allergic rhinitis    Anxiety    Arthritis    Asthma    related to seasonal allergies   Breast calcification, right 02/02/2013   Surgically excised 02/17/13 > path benign    Cancer ([place])    Depression    Family history of breast cancer 04/12/2021   Family history of ovarian cancer 04/12/2021   GERD (gastroesophageal reflux disease)    Hyperlipidemia    IUD 02/07/2010   removed   Rotator cuff tear    right   Past Surgical History:  Procedure Laterality Date   BREAST BIOPSY Right 02/17/2013   Procedure:  NEEDLE LOCALIZATION REMOVAL RIGHT BREAST CALCIFICATIONS;  Surgeon: Haywood Lasso, MD;  Location: [place];  Service: General;  Laterality: Right;   BREAST EXCISIONAL BIOPSY Right pt unsure   benign   BREAST RECONSTRUCTION WITH PLACEMENT OF TISSUE EXPANDER AND ALLODERM Bilateral 04/26/2021   Procedure: BREAST RECONSTRUCTION WITH PLACEMENT OF TISSUE EXPANDER AND ALLODERM;  Surgeon: Irene Limbo, MD;  Location: [place];  Service: Plastics;  Laterality: Bilateral;   BURCH PROCEDURE N/A 12/22/2013   Procedure: BURCH CYSTO URETHROPEXY, ANTERIOR AND POSTERIOR CULPORRHAPHY;  Surgeon: Ena Dawley, MD;  Location: [facility];  Service: Gynecology;  Laterality: N/A;   CERVICAL DISC ARTHROPLASTY N/A 11/12/2017   Procedure: ARTIFICIAL DISC REPLACEMENT CERVICAL  SIX-SEVEN;  Surgeon: Ashok Pall, MD;  Location: [place];  Service: Neurosurgery;  Laterality: N/A;  ARTIFICIAL DISC REPLACEMENT CERVICAL 6- CERVICAL 7   CHOLECYSTECTOMY N/A 10/09/2017   Procedure: LAPAROSCOPIC CHOLECYSTECTOMY WITH INTRAOPERATIVE CHOLANGIOGRAM;  Surgeon: Judeth Horn, MD;  Location: [place];  Service: General;  Laterality: N/A;   KNEE ARTHROSCOPY WITH MEDIAL MENISECTOMY Right 05/01/2020   Procedure: KNEE ARTHROSCOPY WITH PARTIAL MEDIAL MENISECTOMY,   PATELLA  CHONDROPLASTY;  Surgeon: Paralee Cancel, MD;  Location: [facility];  Service: Orthopedics;  Laterality: Right;   LIPOSUCTION WITH LIPOFILLING Bilateral 07/23/2021   Procedure: LIPOFILLING FROM ABDOMEN TO BILATERAL CHEST;  Surgeon: Irene Limbo, MD;  Location: [place];  Service: Plastics;  Laterality: Bilateral;   MASTECTOMY W/ SENTINEL NODE BIOPSY Right 04/26/2021   Procedure: RIGHT MASTECTOMY WITH RIGHT SENTINEL LYMPH NODE BIOPSY;  Surgeon: [name], MD;  Location: [place];  Service: General;  Laterality: Right;   MOUTH SURGERY     NASAL SINUS SURGERY     REMOVAL OF BILATERAL TISSUE EXPANDERS WITH PLACEMENT OF BILATERAL BREAST IMPLANTS Bilateral 07/23/2021   Procedure: REMOVAL OF BILATERAL TISSUE EXPANDERS WITH PLACEMENT OF BILATERAL BREAST SILICONE IMPLANTS;  Surgeon: Irene Limbo, MD;  Location: [place];  Service: Plastics;  Laterality: Bilateral;   SHOULDER ARTHROSCOPY WITH ROTATOR CUFF REPAIR AND OPEN BICEPS TENODESIS Right 08/29/2021  Procedure: RIGHT SHOULDER ARTHROSCOPY WITH ROTATOR CUFF REPAIR WITH PATCH AUGMENTATION AND  BICEPS TENODESIS;  Surgeon: Vanetta Mulders, MD;  Location: [place];  Service: Orthopedics;  Laterality: Right;   TOTAL MASTECTOMY Left 04/26/2021   Procedure: LEFT TOTAL MASTECTOMY;  Surgeon: [name], MD;  Location: [place];  Service: General;  Laterality: Left;   Patient  Active Problem List   Diagnosis Date Noted   Nontraumatic complete tear of right rotator cuff    Biceps tendinitis of right upper extremity    Breast cancer, right ([place]) 04/26/2021   Family history of breast cancer 04/12/2021   Malignant neoplasm of upper outer quadrant of female breast ([place]) 04/12/2021   Family history of colon cancer 04/12/2021   Family history of ovarian cancer 14/97/0263   Metabolic syndrome 78/58/8502   Diverticular disease of colon 07/17/2020   Family history of malignant neoplasm of gastrointestinal tract 07/17/2020   Fatty liver 77/41/2878   Nonalcoholic steatohepatitis (NASH) 07/17/2020   Personal history of colonic polyps 07/17/2020   Acute medial meniscal tear, right, subsequent encounter 05/01/2020   ETD (Eustachian tube dysfunction), bilateral 12/27/2019   Obesity (BMI 30-39.9) 07/12/2018   Cervical spondylosis with radiculopathy 11/12/2017   Vitamin D deficiency 07/07/2017   Urinary incontinence in female 12/22/2013   Asthma, mild intermittent 03/22/2013   Vaginal itching 06/01/2012   Thyromegaly 03/18/2012   General medical examination 03/14/2011   CHICKENPOX, HX OF 02/15/2010   Hyperlipidemia 02/14/2009   DEPRESSION 02/14/2009   ALLERGIC RHINITIS 02/14/2009    PCP: Midge Minium, MD  REFERRING PROVIDER: Midge Minium, MD  REFERRING DIAG:  [phone] (ICD-10-CM) - Complete rotator cuff tear or rupture of right shoulder, not specified as traumatic  M67.813 (ICD-10-CM) - Biceps tendonosis of right shoulder    THERAPY DIAG:  Muscle weakness (generalized)  Stiffness of right shoulder, not elsewhere classified  Localized edema  Acute pain of right shoulder   ONSET DATE: 08/29/21 Surgery   PROCEDURE: 1.  Right rotator cuff arthroscopic repair with collagen patch augmentation 2.  Right biceps tenodesis 3.  Right shoulder extensive debridement 4.  Right shoulder acromioplasty with subacromial decompression  SUBJECTIVE:                                                                                                                                                                                       SUBJECTIVE STATEMENT: Pt states that she was not excessively sore after last session.  She had slight soreness from moving it.  Pt is 11 wks at this time.    PERTINENT HISTORY: Breast cancer, double mastectomy  PAIN:  Are you having pain?  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 Complete tear of the supraspinatus tendon with 4 cm of retraction. 2. Severe tendinosis of the infraspinatus tendon with an interstitial tear. 3. Mild tendinosis of the intra-articular portion of the long head of the biceps tendon.   TODAY'S TREATMENT:   OH pulley's 2 min ABD and Flexion  -S/L ABD 2x10 -S/L flexion 2x5, 1x10 -Supine AROM protraction 3x10 -supine shoulder ABC 2x -prone row 1lb 3x10 -prone T 3x10 -S/L 1lb ER 3x10 -standing IR RTB 3x10 -supine YTB horizontal ABD 2x10  Manual therapy: R GHJ post and inf mob grade II-III    PATIENT EDUCATION: Education details: precautions/protocol, edema management, joint protection, exercise progression, muscle firing,  HEP,    Person educated: Patient Education method: Explanation, Demonstration, Tactile cues, Verbal cues, and Handouts Education comprehension: verbalized understanding, returned demonstration, verbal cues required, and tactile cues required   HOME EXERCISE PROGRAM: Access Code: [email or web address] URL: [email or web address] Date: 11/04/2021 Prepared by: Daleen Bo  Exercises -  Seated Shoulder Flexion AAROM with Pulley Behind  - 2-3 x daily - 7 x weekly - 1 sets - 1 reps - 2 min hold - Seated Shoulder Abduction AAROM with Pulley Behind  - 2-3 x daily - 7 x weekly - 1 sets - 1 reps - 23mn hold - Standing Shoulder Internal Rotation Stretch with Towel  - 1 x daily - 7 x weekly - 1 sets - 10 reps - 5 hold - Standing Shoulder Row with Anchored Resistance  - 1 x daily - 3-4 x weekly - 3 sets - 10 reps - Prone Shoulder Extension - Single Arm  - 1 x daily - 3-4 x weekly - 3 sets - 10 reps - Sidelying Shoulder External Rotation  - 1 x daily - 3-4 x weekly - 1 sets - 10 reps - Standing Shoulder Scaption  - 1 x daily - 3-4 x weekly - 3 sets - 10 reps  ASSESSMENT:  CLINICAL IMPRESSION: Pt able to progress volume of exercise today and demonstrate more AROM vs AAROM with flexion and ABD exercise. Pt without pain during session, only onset of muscle fatigue with repetitions. HEP updated at this time. Plan to drop down to 1x/week at next session as long as there is no increase in pain or inflammation from today's session. Pt continues to have good ROM and is primarily working on shoulder strength at this time. Pt is continuing to improve with R shoulder motion as expected at this time. Pt would benefit from continued skilled therapy in order to reach goals and maximize functional R UE strength and ROM for full return to PLOF.     OBJECTIVE IMPAIRMENTS decreased ROM, decreased strength, hypomobility, increased fascial restrictions, increased muscle spasms, impaired flexibility, impaired UE functional use, improper body mechanics, postural dysfunction, and pain.   ACTIVITY LIMITATIONS cleaning, community activity, driving, meal prep, occupation, laundry, medication management, yard work, shopping, and exercise .   PERSONAL FACTORS Age, Behavior pattern, Fitness, Time since onset of injury/illness/exacerbation, and 1-2 comorbidities:    are also affecting patient's functional outcome.     REHAB POTENTIAL: Good  CLINICAL DECISION MAKING: Stable/uncomplicated  EVALUATION COMPLEXITY: Low  GOALS:   SHORT TERM GOALS: Target date: 12/24/2021  Pt will become independent with HEP in order to demonstrate synthesis of PT education.   Goal status: INITIAL  2.  Pt will be able to demonstrate PROM to protocol limits in order to demonstrate functional improvement in R UE function for progression to next phase of protocol.   Goal status: INITIAL  3.  Pt will be able to demonstrate AAROM without pain in order to demonstrate functional improvement in UE function for self-care and house hold duties.   Goal status: INITIAL  LONG TERM GOALS: Target date: 02/04/2022  Pt  will become independent with final HEP in order to demonstrate synthesis of PT education.   Goal status: INITIAL  2.  Pt will be able to demonstrate full A/PROM in order to demonstrate functional improvement in UE function for self-care and house hold duties.   Goal status: INITIAL  3.  Pt will be able to demonstrate ability to reach and set 1lb object onto shelf in order to demonstrate functional improvement in UE function for self-care and house hold duties.   Goal status: INITIAL  4.  Pt will be able to reach [facility] and carry/hold >5 lbs in order to demonstrate functional improvement in R UE strength for return to PLOF and exercise.   Goal status: INITIAL   PLAN: PT FREQUENCY: 1-2x/week  PT DURATION: 12 weeks   PLANNED INTERVENTIONS: Therapeutic exercises, Therapeutic activity, Neuromuscular re-education, Balance training, Gait training, Patient/Family education, Joint manipulation, Joint mobilization, Aquatic Therapy, Dry Needling, Electrical stimulation, Spinal manipulation, Spinal mobilization, Cryotherapy, Moist heat, scar mobilization, Taping, Vasopneumatic device, Traction, Ultrasound, Ionotophoresis '4mg'$ /ml Dexamethasone, and Manual therapy  PLAN FOR NEXT SESSION: Progress strengthening and  ROM per protocol   Daleen Bo, PT 11/12/2021, 11:44 AM

## 2021-11-14 ENCOUNTER — Ambulatory Visit (HOSPITAL_BASED_OUTPATIENT_CLINIC_OR_DEPARTMENT_OTHER): Payer: 59 | Attending: Orthopaedic Surgery | Admitting: Physical Therapy

## 2021-11-14 ENCOUNTER — Other Ambulatory Visit (HOSPITAL_BASED_OUTPATIENT_CLINIC_OR_DEPARTMENT_OTHER): Payer: Self-pay

## 2021-11-14 ENCOUNTER — Encounter (HOSPITAL_BASED_OUTPATIENT_CLINIC_OR_DEPARTMENT_OTHER): Payer: Self-pay | Admitting: Physical Therapy

## 2021-11-14 DIAGNOSIS — M25511 Pain in right shoulder: Secondary | ICD-10-CM

## 2021-11-14 DIAGNOSIS — M25611 Stiffness of right shoulder, not elsewhere classified: Secondary | ICD-10-CM

## 2021-11-14 DIAGNOSIS — R600 Localized edema: Secondary | ICD-10-CM | POA: Diagnosis not present

## 2021-11-14 DIAGNOSIS — M6281 Muscle weakness (generalized): Secondary | ICD-10-CM

## 2021-11-14 NOTE — Therapy (Signed)
OUTPATIENT PHYSICAL THERAPY SHOULDER TREATMENT   Patient Name: Katelyn Reyes MRN: [id number] DOB:1960-11-14, 61 y.o., female Today's Date: 11/14/2021   PT End of Session - 11/14/21 1103     Visit Number 16    Number of Visits 25    Date for PT Re-Evaluation 12/02/21    Authorization Type [employer]    PT Start Time 1100    PT Stop Time 1141    PT Time Calculation (min) 41 min    Activity Tolerance Patient tolerated treatment well    Behavior During Therapy [facility] for tasks assessed/performed                          Past Medical History:  Diagnosis Date   Allergic rhinitis    Anxiety    Arthritis    Asthma    related to seasonal allergies   Breast calcification, right 02/02/2013   Surgically excised 02/17/13 > path benign    Cancer ([place])    Depression    Family history of breast cancer 04/12/2021   Family history of ovarian cancer 04/12/2021   GERD (gastroesophageal reflux disease)    Hyperlipidemia    IUD 02/07/2010   removed   Rotator cuff tear    right   Past Surgical History:  Procedure Laterality Date   BREAST BIOPSY Right 02/17/2013   Procedure:  NEEDLE LOCALIZATION REMOVAL RIGHT BREAST CALCIFICATIONS;  Surgeon: Haywood Lasso, MD;  Location: [place];  Service: General;  Laterality: Right;   BREAST EXCISIONAL BIOPSY Right pt unsure   benign   BREAST RECONSTRUCTION WITH PLACEMENT OF TISSUE EXPANDER AND ALLODERM Bilateral 04/26/2021   Procedure: BREAST RECONSTRUCTION WITH PLACEMENT OF TISSUE EXPANDER AND ALLODERM;  Surgeon: Irene Limbo, MD;  Location: [place];  Service: Plastics;  Laterality: Bilateral;   BURCH PROCEDURE N/A 12/22/2013   Procedure: BURCH CYSTO URETHROPEXY, ANTERIOR AND POSTERIOR CULPORRHAPHY;  Surgeon: Ena Dawley, MD;  Location: [facility];  Service: Gynecology;  Laterality: N/A;   CERVICAL DISC ARTHROPLASTY N/A 11/12/2017   Procedure: ARTIFICIAL DISC REPLACEMENT CERVICAL  SIX-SEVEN;  Surgeon: Ashok Pall, MD;  Location: [place];  Service: Neurosurgery;  Laterality: N/A;  ARTIFICIAL DISC REPLACEMENT CERVICAL 6- CERVICAL 7   CHOLECYSTECTOMY N/A 10/09/2017   Procedure: LAPAROSCOPIC CHOLECYSTECTOMY WITH INTRAOPERATIVE CHOLANGIOGRAM;  Surgeon: Judeth Horn, MD;  Location: [place];  Service: General;  Laterality: N/A;   KNEE ARTHROSCOPY WITH MEDIAL MENISECTOMY Right 05/01/2020   Procedure: KNEE ARTHROSCOPY WITH PARTIAL MEDIAL MENISECTOMY,   PATELLA  CHONDROPLASTY;  Surgeon: Paralee Cancel, MD;  Location: [facility];  Service: Orthopedics;  Laterality: Right;   LIPOSUCTION WITH LIPOFILLING Bilateral 07/23/2021   Procedure: LIPOFILLING FROM ABDOMEN TO BILATERAL CHEST;  Surgeon: Irene Limbo, MD;  Location: [place];  Service: Plastics;  Laterality: Bilateral;   MASTECTOMY W/ SENTINEL NODE BIOPSY Right 04/26/2021   Procedure: RIGHT MASTECTOMY WITH RIGHT SENTINEL LYMPH NODE BIOPSY;  Surgeon: [name], MD;  Location: [place];  Service: General;  Laterality: Right;   MOUTH SURGERY     NASAL SINUS SURGERY     REMOVAL OF BILATERAL TISSUE EXPANDERS WITH PLACEMENT OF BILATERAL BREAST IMPLANTS Bilateral 07/23/2021   Procedure: REMOVAL OF BILATERAL TISSUE EXPANDERS WITH PLACEMENT OF BILATERAL BREAST SILICONE IMPLANTS;  Surgeon: Irene Limbo, MD;  Location: [place];  Service: Plastics;  Laterality: Bilateral;   SHOULDER ARTHROSCOPY WITH ROTATOR CUFF REPAIR AND OPEN BICEPS TENODESIS Right  08/29/2021   Procedure: RIGHT SHOULDER ARTHROSCOPY WITH ROTATOR CUFF REPAIR WITH PATCH AUGMENTATION AND  BICEPS TENODESIS;  Surgeon: Vanetta Mulders, MD;  Location: [place];  Service: Orthopedics;  Laterality: Right;   TOTAL MASTECTOMY Left 04/26/2021   Procedure: LEFT TOTAL MASTECTOMY;  Surgeon: [name], MD;  Location: [place];  Service: General;  Laterality: Left;   Patient  Active Problem List   Diagnosis Date Noted   Nontraumatic complete tear of right rotator cuff    Biceps tendinitis of right upper extremity    Breast cancer, right ([place]) 04/26/2021   Family history of breast cancer 04/12/2021   Malignant neoplasm of upper outer quadrant of female breast ([place]) 04/12/2021   Family history of colon cancer 04/12/2021   Family history of ovarian cancer 74/82/7078   Metabolic syndrome 67/54/4920   Diverticular disease of colon 07/17/2020   Family history of malignant neoplasm of gastrointestinal tract 07/17/2020   Fatty liver 03/22/1218   Nonalcoholic steatohepatitis (NASH) 07/17/2020   Personal history of colonic polyps 07/17/2020   Acute medial meniscal tear, right, subsequent encounter 05/01/2020   ETD (Eustachian tube dysfunction), bilateral 12/27/2019   Obesity (BMI 30-39.9) 07/12/2018   Cervical spondylosis with radiculopathy 11/12/2017   Vitamin D deficiency 07/07/2017   Urinary incontinence in female 12/22/2013   Asthma, mild intermittent 03/22/2013   Vaginal itching 06/01/2012   Thyromegaly 03/18/2012   General medical examination 03/14/2011   CHICKENPOX, HX OF 02/15/2010   Hyperlipidemia 02/14/2009   DEPRESSION 02/14/2009   ALLERGIC RHINITIS 02/14/2009    PCP: Midge Minium, MD  REFERRING PROVIDER: Midge Minium, MD  REFERRING DIAG:  [phone] (ICD-10-CM) - Complete rotator cuff tear or rupture of right shoulder, not specified as traumatic  M67.813 (ICD-10-CM) - Biceps tendonosis of right shoulder    THERAPY DIAG:  Muscle weakness (generalized)  Stiffness of right shoulder, not elsewhere classified  Localized edema  Acute pain of right shoulder   ONSET DATE: 08/29/21 Surgery   PROCEDURE: 1.  Right rotator cuff arthroscopic repair with collagen patch augmentation 2.  Right biceps tenodesis 3.  Right shoulder extensive debridement 4.  Right shoulder acromioplasty with subacromial decompression  SUBJECTIVE:                                                                                                                                                                                       SUBJECTIVE STATEMENT: Pt states that she was not excessively sore after last session.  She had slight soreness from moving it.  Pt is 11 wks at this time.    PERTINENT HISTORY: Breast cancer, double mastectomy  PAIN:  Are  you having pain? 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 Complete tear of the supraspinatus tendon with 4 cm of retraction. 2. Severe tendinosis of the infraspinatus tendon with an interstitial tear. 3. Mild tendinosis of the intra-articular portion of the long head of the biceps tendon.   TODAY'S TREATMENT:   OH pulley's 2 min ABD and Flexion  -S/L ABD 2x10 -S/L flexion 2x10 -Supine AROM protraction 3x10 -supine shoulder ABC 2x 1lb weight -prone row 1lb 3x10 -RTB rowing 3x10 -prone T 3x10 -S/L 1lb ER 3x10 -standing IR RTB 3x10 -supine YTB horizontal ABD 2x10     PATIENT EDUCATION: Education details: precautions/protocol, edema management, joint protection, exercise progression, muscle firing,  HEP,    Person educated: Patient Education method: Explanation, Demonstration, Tactile cues, Verbal cues, and Handouts Education comprehension: verbalized understanding, returned demonstration, verbal cues required, and tactile cues required   HOME EXERCISE PROGRAM: Access Code: [email or web address] URL: [email or web address] Date: 11/04/2021 Prepared by: Daleen Bo  Exercises - Seated Shoulder Flexion AAROM with  Pulley Behind  - 2-3 x daily - 7 x weekly - 1 sets - 1 reps - 2 min hold - Seated Shoulder Abduction AAROM with Pulley Behind  - 2-3 x daily - 7 x weekly - 1 sets - 1 reps - 108mn hold - Standing Shoulder Internal Rotation Stretch with Towel  - 1 x daily - 7 x weekly - 1 sets - 10 reps - 5 hold - Standing Shoulder Row with Anchored Resistance  - 1 x daily - 3-4 x weekly - 3 sets - 10 reps - Prone Shoulder Extension - Single Arm  - 1 x daily - 3-4 x weekly - 3 sets - 10 reps - Sidelying Shoulder External Rotation  - 1 x daily - 3-4 x weekly - 1 sets - 10 reps - Standing Shoulder Scaption  - 1 x daily - 3-4 x weekly - 3 sets - 10 reps  ASSESSMENT:  CLINICAL IMPRESSION: Pt able to continue with progression of AROM, strengthening, and motor control exercise without increasing pain. Pt able to complete exercise with shorter duration rest between sets as well as complete 10x repetition of anti-gravity movement without rest pauses or segmented sets and reps. Pt to see MD prior to next visit to discuss next phase of rehab. Plan to update HEP at next session. Pt likely able to decreased frequency once pure strengthening is established as pt still has difficulty with some form and technique. Pt would benefit from continued skilled therapy in order to reach goals and maximize functional R UE strength and ROM for full return to PLOF.     OBJECTIVE IMPAIRMENTS decreased ROM, decreased strength, hypomobility, increased fascial restrictions, increased muscle spasms, impaired flexibility, impaired UE functional use, improper body mechanics, postural dysfunction, and pain.   ACTIVITY LIMITATIONS cleaning, community activity, driving, meal prep, occupation, laundry, medication management, yard work, shopping, and exercise .   PERSONAL FACTORS Age, Behavior pattern, Fitness, Time since onset of injury/illness/exacerbation, and 1-2 comorbidities:    are also affecting patient's functional outcome.    REHAB  POTENTIAL: Good  CLINICAL DECISION MAKING: Stable/uncomplicated  EVALUATION COMPLEXITY: Low   GOALS:   SHORT TERM  GOALS: Target date: 12/26/2021  Pt will become independent with HEP in order to demonstrate synthesis of PT education.   Goal status: INITIAL  2.  Pt will be able to demonstrate PROM to protocol limits in order to demonstrate functional improvement in R UE function for progression to next phase of protocol.   Goal status: INITIAL  3.  Pt will be able to demonstrate AAROM without pain in order to demonstrate functional improvement in UE function for self-care and house hold duties.   Goal status: INITIAL  LONG TERM GOALS: Target date: 02/06/2022  Pt  will become independent with final HEP in order to demonstrate synthesis of PT education.   Goal status: INITIAL  2.  Pt will be able to demonstrate full A/PROM in order to demonstrate functional improvement in UE function for self-care and house hold duties.   Goal status: INITIAL  3.  Pt will be able to demonstrate ability to reach and set 1lb object onto shelf in order to demonstrate functional improvement in UE function for self-care and house hold duties.   Goal status: INITIAL  4.  Pt will be able to reach [facility] and carry/hold >5 lbs in order to demonstrate functional improvement in R UE strength for return to PLOF and exercise.   Goal status: INITIAL   PLAN: PT FREQUENCY: 1-2x/week  PT DURATION: 12 weeks   PLANNED INTERVENTIONS: Therapeutic exercises, Therapeutic activity, Neuromuscular re-education, Balance training, Gait training, Patient/Family education, Joint manipulation, Joint mobilization, Aquatic Therapy, Dry Needling, Electrical stimulation, Spinal manipulation, Spinal mobilization, Cryotherapy, Moist heat, scar mobilization, Taping, Vasopneumatic device, Traction, Ultrasound, Ionotophoresis '4mg'$ /ml Dexamethasone, and Manual therapy  PLAN FOR NEXT SESSION: Progress strengthening and ROM per  protocol   Daleen Bo, PT 11/14/2021, 11:45 AM

## 2021-11-14 NOTE — Telephone Encounter (Signed)
Called [place] [facility] at [place] they stated PA was submitted 10/23/21 and denied through CoverMyMeds will check and re submit as pt has been on for a year

## 2021-11-14 NOTE — Telephone Encounter (Signed)
Attempted to resubmit via CoverMyMeds stated that member could not be found will have to call pts insurance company [phone]

## 2021-11-15 ENCOUNTER — Other Ambulatory Visit (HOSPITAL_BASED_OUTPATIENT_CLINIC_OR_DEPARTMENT_OTHER): Payer: Self-pay

## 2021-11-15 NOTE — Telephone Encounter (Signed)
Pt called in checking on this please advise pt can be reached [phone]

## 2021-11-15 NOTE — Telephone Encounter (Signed)
Attempted 3 calls to [employer], the first insurance person I spoke with was not able to help they gave me a phone number that was disconnected, when I tried calling again it stated the phone hours had ended. Will have to try again Monday and call pharmacy for better number for insurance

## 2021-11-18 ENCOUNTER — Other Ambulatory Visit (HOSPITAL_COMMUNITY): Payer: Self-pay

## 2021-11-18 ENCOUNTER — Other Ambulatory Visit (HOSPITAL_BASED_OUTPATIENT_CLINIC_OR_DEPARTMENT_OTHER): Payer: Self-pay

## 2021-11-18 ENCOUNTER — Encounter (HOSPITAL_BASED_OUTPATIENT_CLINIC_OR_DEPARTMENT_OTHER): Payer: Self-pay | Admitting: Physical Therapy

## 2021-11-18 ENCOUNTER — Ambulatory Visit (HOSPITAL_BASED_OUTPATIENT_CLINIC_OR_DEPARTMENT_OTHER): Payer: 59 | Admitting: Physical Therapy

## 2021-11-18 ENCOUNTER — Ambulatory Visit (INDEPENDENT_AMBULATORY_CARE_PROVIDER_SITE_OTHER): Payer: 59 | Admitting: Orthopaedic Surgery

## 2021-11-18 DIAGNOSIS — M25611 Stiffness of right shoulder, not elsewhere classified: Secondary | ICD-10-CM | POA: Diagnosis not present

## 2021-11-18 DIAGNOSIS — M6281 Muscle weakness (generalized): Secondary | ICD-10-CM

## 2021-11-18 DIAGNOSIS — R600 Localized edema: Secondary | ICD-10-CM | POA: Diagnosis not present

## 2021-11-18 DIAGNOSIS — M25511 Pain in right shoulder: Secondary | ICD-10-CM

## 2021-11-18 DIAGNOSIS — M75121 Complete rotator cuff tear or rupture of right shoulder, not specified as traumatic: Secondary | ICD-10-CM

## 2021-11-18 NOTE — Progress Notes (Signed)
Post Operative Evaluation    Procedure/Date of Surgery: Right shoulder rotator cuff repair with biceps tenodesis August 29, 2021  Interval History:   Presents today for follow-up at 3 months.  Overall she is doing extremely well.  She does not have any shoulder pain at this time.  Range of motion has been improving dramatically.  She is scheduled to return to work at this time.  PMH/PSH/Family History/Social History/Meds/Allergies:    Past Medical History:  Diagnosis Date   Allergic rhinitis    Anxiety    Arthritis    Asthma    related to seasonal allergies   Breast calcification, right 02/02/2013   Surgically excised 02/17/13 > path benign    Cancer ([place])    Depression    Family history of breast cancer 04/12/2021   Family history of ovarian cancer 04/12/2021   GERD (gastroesophageal reflux disease)    Hyperlipidemia    IUD 02/07/2010   removed   Rotator cuff tear    right   Past Surgical History:  Procedure Laterality Date   BREAST BIOPSY Right 02/17/2013   Procedure:  NEEDLE LOCALIZATION REMOVAL RIGHT BREAST CALCIFICATIONS;  Surgeon: Haywood Lasso, MD;  Location: [facility];  Service: General;  Laterality: Right;   BREAST EXCISIONAL BIOPSY Right pt unsure   benign   BREAST RECONSTRUCTION WITH PLACEMENT OF TISSUE EXPANDER AND ALLODERM Bilateral 04/26/2021   Procedure: BREAST RECONSTRUCTION WITH PLACEMENT OF TISSUE EXPANDER AND ALLODERM;  Surgeon: Irene Limbo, MD;  Location: [place];  Service: Plastics;  Laterality: Bilateral;   BURCH PROCEDURE N/A 12/22/2013   Procedure: BURCH CYSTO URETHROPEXY, ANTERIOR AND POSTERIOR CULPORRHAPHY;  Surgeon: Ena Dawley, MD;  Location: [facility];  Service: Gynecology;  Laterality: N/A;   CERVICAL DISC ARTHROPLASTY N/A 11/12/2017   Procedure: ARTIFICIAL DISC REPLACEMENT CERVICAL SIX-SEVEN;  Surgeon: Ashok Pall, MD;  Location: [place];  Service: Neurosurgery;   Laterality: N/A;  ARTIFICIAL DISC REPLACEMENT CERVICAL 6- CERVICAL 7   CHOLECYSTECTOMY N/A 10/09/2017   Procedure: LAPAROSCOPIC CHOLECYSTECTOMY WITH INTRAOPERATIVE CHOLANGIOGRAM;  Surgeon: Judeth Horn, MD;  Location: [place];  Service: General;  Laterality: N/A;   KNEE ARTHROSCOPY WITH MEDIAL MENISECTOMY Right 05/01/2020   Procedure: KNEE ARTHROSCOPY WITH PARTIAL MEDIAL MENISECTOMY,   PATELLA  CHONDROPLASTY;  Surgeon: Paralee Cancel, MD;  Location: [facility];  Service: Orthopedics;  Laterality: Right;   LIPOSUCTION WITH LIPOFILLING Bilateral 07/23/2021   Procedure: LIPOFILLING FROM ABDOMEN TO BILATERAL CHEST;  Surgeon: Irene Limbo, MD;  Location: [employer];  Service: Plastics;  Laterality: Bilateral;   MASTECTOMY W/ SENTINEL NODE BIOPSY Right 04/26/2021   Procedure: RIGHT MASTECTOMY WITH RIGHT SENTINEL LYMPH NODE BIOPSY;  Surgeon: [name], MD;  Location: [place];  Service: General;  Laterality: Right;   MOUTH SURGERY     NASAL SINUS SURGERY     REMOVAL OF BILATERAL TISSUE EXPANDERS WITH PLACEMENT OF BILATERAL BREAST IMPLANTS Bilateral 07/23/2021   Procedure: REMOVAL OF BILATERAL TISSUE EXPANDERS WITH PLACEMENT OF BILATERAL BREAST SILICONE IMPLANTS;  Surgeon: Irene Limbo, MD;  Location: [place];  Service: Plastics;  Laterality: Bilateral;   SHOULDER ARTHROSCOPY WITH ROTATOR CUFF REPAIR AND OPEN BICEPS TENODESIS Right 08/29/2021   Procedure: RIGHT SHOULDER ARTHROSCOPY WITH ROTATOR CUFF REPAIR WITH PATCH AUGMENTATION AND  BICEPS TENODESIS;  Surgeon:  Vanetta Mulders, MD;  Location: [place] [facility];  Service: Orthopedics;  Laterality: Right;   TOTAL MASTECTOMY Left 04/26/2021   Procedure: LEFT TOTAL MASTECTOMY;  Surgeon: [name], MD;  Location: [place];  Service: General;  Laterality: Left;   Social History   Socioeconomic History   Marital status: Single    Spouse name: Not on  file   Number of children: Not on file   Years of education: Not on file   Highest education level: Not on file  Occupational History   Not on file  Tobacco Use   Smoking status: Never   Smokeless tobacco: Never  Vaping Use   Vaping Use: Never used  Substance and Sexual Activity   Alcohol use: Yes    Comment: social   Drug use: No   Sexual activity: Yes    Birth control/protection: Post-menopausal  Other Topics Concern   Not on file  Social History Narrative   Not on file   Social Determinants of Health   Financial Resource Strain: Not on file  Food Insecurity: Not on file  Transportation Needs: Not on file  Physical Activity: Not on file  Stress: Not on file  Social Connections: Not on file   Family History  Problem Relation Age of Onset   Fibrocystic breast disease Mother    Hypertension Father    Diabetes Father    Breast cancer Maternal Aunt        dx 15s   Colon cancer Paternal Aunt        x2 pat aunts, dx after 76   Stroke Maternal Grandmother    Cancer Other        ovarian/GYN; MGF's mother   Colon cancer Other        MGM's father; dx after 34   Coronary artery disease Neg Hx    Allergies  Allergen Reactions   Amoxicillin Rash and Other (See Comments)    Has patient had a PCN reaction causing immediate rash, facial/tongue/throat swelling, SOB or lightheadedness with hypotension: No Has patient had a PCN reaction causing severe rash involving mucus membranes or skin necrosis: No Has patient had a PCN reaction that required treatment: #  #  #  YES  #  #  # - MD office Has patient had a PCN reaction occurring within the last 10 years: Yes If all of the above answers are "NO", then may proceed with Cephalosporin use.    Erythromycin Nausea Only   Hydrocodone-Acetaminophen Other (See Comments)    Hallucinations   Penicillin G Rash   Shellfish Allergy Itching and Other (See Comments)    REACTS TO SCALLOPS    Current Outpatient Medications  Medication  Sig Dispense Refill   albuterol (VENTOLIN HFA) 108 (90 Base) MCG/ACT inhaler INHALE 2 PUFFS INTO THE LUNGS EVERY 6 HOURS AS NEEDED FOR WHEEZING. 18 g 6   ALPRAZolam (XANAX) 0.5 MG tablet Take 1 tablet (0.5 mg total) by mouth 3 (three) times daily as needed. 60 tablet 3   anastrozole (ARIMIDEX) 1 MG tablet Take 1 tablet (1 mg total) by mouth daily. 90 tablet 12   cetirizine (ZYRTEC) 10 MG tablet TAKE 1 TABLET (10 MG TOTAL) BY MOUTH DAILY. 90 tablet 1   Cholecalciferol (VITAMIN D-3) 125 MCG (5000 UT) TABS Take 5,000 Units by mouth every evening.     fluticasone (FLONASE) 50 MCG/ACT nasal spray PLACE 2 SPRAYS INTO BOTH NOSTRILS DAILY. 16 g 6   methocarbamol (ROBAXIN) 500 MG tablet  Take 1 tablet (500 mg total) by mouth in the morning and at bedtime. 30 tablet 0   methylPREDNISolone (MEDROL DOSEPAK) 4 MG TBPK tablet Take per packet instructions 21 tablet 0   montelukast (SINGULAIR) 10 MG tablet TAKE 1 TABLET (10 MG TOTAL) BY MOUTH DAILY. 90 tablet 1   oxyCODONE (OXY IR/ROXICODONE) 5 MG immediate release tablet Take 1 tablet (5 mg total) by mouth every 4 (four) hours as needed (severe pain). 20 tablet 0   pantoprazole (PROTONIX) 40 MG tablet Take 1 tablet (40 mg total) by mouth 2 (two) times daily. 180 tablet 1   Semaglutide, 1 MG/DOSE, (OZEMPIC, 1 MG/DOSE,) 4 MG/3ML SOPN Inject 1 mg as directed once a week. 3 mL 2   sertraline (ZOLOFT) 100 MG tablet Take 1 tablet (100 mg total) by mouth daily. 90 tablet 1   zolpidem (AMBIEN) 10 MG tablet Take 1 tablet (10 mg total) by mouth at bedtime as needed for sleep. 30 tablet 3   No current facility-administered medications for this visit.   No results found.  Review of Systems:   A ROS was performed including pertinent positives and negatives as documented in the HPI.   Musculoskeletal Exam:    There were no vitals taken for this visit.  Skin incisions are well-healed. Active forward elevation is to 160 degrees nearly equal to the contralateral side.   External rotation at the side is to 60 degrees without difficulty she able flex and extend at the right elbow.  Internal rotation is to L1.  She able to fire EPL as well as wrist extensors 2+ radial pulse  Imaging:    None  I personally reviewed and interpreted the radiographs.   Assessment:   61 year old female who is 12 weeks status post right shoulder rotator cuff repair and biceps tenodesis.  Overall she is doing extremely well.  She will return to clinic in 2 months.  I will plan to keep her on a 5 pound weight restriction until that time Plan :    -Return to clinic in 8     I personally saw and evaluated the patient, and participated in the management and treatment plan.  Vanetta Mulders, MD Attending Physician, Orthopedic Surgery  This document was dictated using Dragon voice recognition software. A reasonable attempt at proof reading has been made to minimize errors.

## 2021-11-18 NOTE — Therapy (Signed)
OUTPATIENT PHYSICAL THERAPY SHOULDER TREATMENT   Patient Name: Katelyn Reyes MRN: [id number] DOB:1960-07-29, 61 y.o., female Today's Date: 11/18/2021   PT End of Session - 11/18/21 1100     Visit Number 17    Number of Visits 25    Date for PT Re-Evaluation 12/02/21    Authorization Type [employer]    PT Start Time 1100    PT Stop Time 1140    PT Time Calculation (min) 40 min    Activity Tolerance Patient tolerated treatment well    Behavior During Therapy [facility] for tasks assessed/performed                           Past Medical History:  Diagnosis Date   Allergic rhinitis    Anxiety    Arthritis    Asthma    related to seasonal allergies   Breast calcification, right 02/02/2013   Surgically excised 02/17/13 > path benign    Cancer ([place])    Depression    Family history of breast cancer 04/12/2021   Family history of ovarian cancer 04/12/2021   GERD (gastroesophageal reflux disease)    Hyperlipidemia    IUD 02/07/2010   removed   Rotator cuff tear    right   Past Surgical History:  Procedure Laterality Date   BREAST BIOPSY Right 02/17/2013   Procedure:  NEEDLE LOCALIZATION REMOVAL RIGHT BREAST CALCIFICATIONS;  Surgeon: Haywood Lasso, MD;  Location: [place];  Service: General;  Laterality: Right;   BREAST EXCISIONAL BIOPSY Right pt unsure   benign   BREAST RECONSTRUCTION WITH PLACEMENT OF TISSUE EXPANDER AND ALLODERM Bilateral 04/26/2021   Procedure: BREAST RECONSTRUCTION WITH PLACEMENT OF TISSUE EXPANDER AND ALLODERM;  Surgeon: Irene Limbo, MD;  Location: [facility];  Service: Plastics;  Laterality: Bilateral;   BURCH PROCEDURE N/A 12/22/2013   Procedure: BURCH CYSTO URETHROPEXY, ANTERIOR AND POSTERIOR CULPORRHAPHY;  Surgeon: Ena Dawley, MD;  Location: [facility];  Service: Gynecology;  Laterality: N/A;   CERVICAL DISC ARTHROPLASTY N/A 11/12/2017   Procedure: ARTIFICIAL DISC REPLACEMENT CERVICAL  SIX-SEVEN;  Surgeon: Ashok Pall, MD;  Location: [facility];  Service: Neurosurgery;  Laterality: N/A;  ARTIFICIAL DISC REPLACEMENT CERVICAL 6- CERVICAL 7   CHOLECYSTECTOMY N/A 10/09/2017   Procedure: LAPAROSCOPIC CHOLECYSTECTOMY WITH INTRAOPERATIVE CHOLANGIOGRAM;  Surgeon: Judeth Horn, MD;  Location: [place];  Service: General;  Laterality: N/A;   KNEE ARTHROSCOPY WITH MEDIAL MENISECTOMY Right 05/01/2020   Procedure: KNEE ARTHROSCOPY WITH PARTIAL MEDIAL MENISECTOMY,   PATELLA  CHONDROPLASTY;  Surgeon: Paralee Cancel, MD;  Location[facility];  Service: Orthopedics;  Laterality: Right;   LIPOSUCTION WITH LIPOFILLING Bilateral 07/23/2021   Procedure: LIPOFILLING FROM ABDOMEN TO BILATERAL CHEST;  Surgeon: Irene Limbo, MD;  Location: [place];  Service: Plastics;  Laterality: Bilateral;   MASTECTOMY W/ SENTINEL NODE BIOPSY Right 04/26/2021   Procedure: RIGHT MASTECTOMY WITH RIGHT SENTINEL LYMPH NODE BIOPSY;  Surgeon: [name], MD;  Location: [place];  Service: General;  Laterality: Right;   MOUTH SURGERY     NASAL SINUS SURGERY     REMOVAL OF BILATERAL TISSUE EXPANDERS WITH PLACEMENT OF BILATERAL BREAST IMPLANTS Bilateral 07/23/2021   Procedure: REMOVAL OF BILATERAL TISSUE EXPANDERS WITH PLACEMENT OF BILATERAL BREAST SILICONE IMPLANTS;  Surgeon: Irene Limbo, MD;  Location: [place];  Service: Plastics;  Laterality: Bilateral;   SHOULDER ARTHROSCOPY WITH ROTATOR CUFF REPAIR AND OPEN BICEPS TENODESIS  Right 08/29/2021   Procedure: RIGHT SHOULDER ARTHROSCOPY WITH ROTATOR CUFF REPAIR WITH PATCH AUGMENTATION AND  BICEPS TENODESIS;  Surgeon: Vanetta Mulders, MD;  Location: [place];  Service: Orthopedics;  Laterality: Right;   TOTAL MASTECTOMY Left 04/26/2021   Procedure: LEFT TOTAL MASTECTOMY;  Surgeon: [name], MD;  Location: [place];  Service: General;  Laterality: Left;   Patient  Active Problem List   Diagnosis Date Noted   Nontraumatic complete tear of right rotator cuff    Biceps tendinitis of right upper extremity    Breast cancer, right ([place]) 04/26/2021   Family history of breast cancer 04/12/2021   Malignant neoplasm of upper outer quadrant of female breast ([place]) 04/12/2021   Family history of colon cancer 04/12/2021   Family history of ovarian cancer 83/38/2505   Metabolic syndrome 39/76/7341   Diverticular disease of colon 07/17/2020   Family history of malignant neoplasm of gastrointestinal tract 07/17/2020   Fatty liver 93/79/0240   Nonalcoholic steatohepatitis ([place]) 07/17/2020   Personal history of colonic polyps 07/17/2020   Acute medial meniscal tear, right, subsequent encounter 05/01/2020   ETD (Eustachian tube dysfunction), bilateral 12/27/2019   Obesity (BMI 30-39.9) 07/12/2018   Cervical spondylosis with radiculopathy 11/12/2017   Vitamin D deficiency 07/07/2017   Urinary incontinence in female 12/22/2013   Asthma, mild intermittent 03/22/2013   Vaginal itching 06/01/2012   Thyromegaly 03/18/2012   General medical examination 03/14/2011   CHICKENPOX, HX OF 02/15/2010   Hyperlipidemia 02/14/2009   DEPRESSION 02/14/2009   ALLERGIC RHINITIS 02/14/2009    PCP: Midge Minium, MD  REFERRING PROVIDER: Midge Minium, MD  REFERRING DIAG:  [phone] (ICD-10-CM) - Complete rotator cuff tear or rupture of right shoulder, not specified as traumatic  M67.813 (ICD-10-CM) - Biceps tendonosis of right shoulder    THERAPY DIAG:  Muscle weakness (generalized)  Stiffness of right shoulder, not elsewhere classified  Localized edema  Acute pain of right shoulder   ONSET DATE: 08/29/21 Surgery   PROCEDURE: 1.  Right rotator cuff arthroscopic repair with collagen patch augmentation 2.  Right biceps tenodesis 3.  Right shoulder extensive debridement 4.  Right shoulder acromioplasty with subacromial decompression  SUBJECTIVE:                                                                                                                                                                                       SUBJECTIVE STATEMENT: Pt states that the shoulder feels good. She had no issues after last session.  Pt is 11 wks at this time.    PERTINENT HISTORY: Breast cancer, double mastectomy  PAIN:  Are you having pain? 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 Complete tear of the supraspinatus tendon with 4 cm of retraction. 2. Severe tendinosis of the infraspinatus tendon with an interstitial tear. 3. Mild tendinosis of the intra-articular portion of the long head of the biceps tendon.   TODAY'S TREATMENT:   6/5  UBE retro and fwd 1mn each  -S/L ABD 3x10 -S/L flexion 3x10 -Supine AROM protraction 3x10 1lb -supine shoulder ABC 2x 1lb weight -prone row 2lb 3x10 -Prone ext 3x10 -RTB rowing 3x10 -prone T 3x10 -S/L 1lb ER 3x10 -standing IR RTB 3x10    Previous: OH pulley's 2 min ABD and Flexion  -S/L ABD 2x10 -S/L flexion 2x10 -Supine AROM protraction 3x10 -supine shoulder ABC 2x 1lb weight -prone row 1lb 3x10 -RTB rowing 3x10 -prone T 3x10 -S/L 1lb ER 3x10 -standing IR RTB 3x10 -supine YTB horizontal ABD 2x10     PATIENT EDUCATION: Education details: precautions/protocol, edema management, joint protection, exercise progression, muscle firing,  HEP,    Person educated: Patient Education method: Explanation, Demonstration, Tactile cues, Verbal cues, and Handouts Education comprehension: verbalized understanding, returned  demonstration, verbal cues required, and tactile cues required   HOME EXERCISE PROGRAM: Access Code: [email or web address] URL: [email or web address] Date: 11/04/2021 Prepared by: ADaleen Bo  ASSESSMENT:  CLINICAL IMPRESSION: Pt able to continue with progression of AROM, strengthening, and motor control exercise volume today without adverse effects. HEP updated at this time and pt advised to decrease to 1x week with therapy. Plan to add in CKC exercise and direct biceps and triceps at next session as tolerated. Pt would benefit from continued skilled therapy in order to reach goals and maximize functional R UE strength and ROM for full return to PLOF.     OBJECTIVE IMPAIRMENTS decreased ROM, decreased strength, hypomobility, increased fascial restrictions, increased muscle spasms, impaired flexibility, impaired UE functional use, improper body mechanics, postural dysfunction, and pain.   ACTIVITY LIMITATIONS cleaning, community activity, driving, meal prep, occupation, laundry, medication management, yard work, shopping, and exercise .   PERSONAL FACTORS Age, Behavior pattern, Fitness, Time since onset of injury/illness/exacerbation, and 1-2 comorbidities:    are also affecting patient's functional outcome.    REHAB POTENTIAL: Good  CLINICAL DECISION MAKING: Stable/uncomplicated  EVALUATION COMPLEXITY: Low   GOALS:   SHORT TERM GOALS: Target date: 12/30/2021  Pt will become independent with HEP in order to demonstrate synthesis of PT education.   Goal status: INITIAL  2.  Pt will be able to demonstrate PROM to protocol limits in order to demonstrate functional improvement in R UE function for progression to next phase of protocol.   Goal status: INITIAL  3.  Pt will be able to demonstrate AAROM without pain in order to demonstrate functional improvement in UE function for self-care and house hold duties.   Goal status: INITIAL  LONG TERM GOALS: Target date:  02/10/2022  Pt  will become independent with final HEP in order to demonstrate synthesis of PT education.   Goal status: INITIAL  2.  Pt will be able to demonstrate full A/PROM in order to demonstrate functional improvement in UE function for self-care and house hold duties.   Goal status: INITIAL  3.  Pt will be able to demonstrate ability to reach and set 1lb object onto shelf in order to demonstrate functional improvement in UE function for self-care and house hold duties.   Goal status: INITIAL  4.  Pt will be able to reach [facility] and carry/hold >5 lbs in order to demonstrate functional improvement in R UE strength for return to PLOF and exercise.   Goal status: INITIAL   PLAN: PT FREQUENCY: 1-2x/week  PT DURATION: 12 weeks   PLANNED INTERVENTIONS: Therapeutic exercises, Therapeutic activity, Neuromuscular re-education, Balance training, Gait training, Patient/Family education, Joint manipulation, Joint mobilization, Aquatic Therapy, Dry Needling, Electrical stimulation, Spinal manipulation, Spinal mobilization, Cryotherapy, Moist heat, scar mobilization, Taping, Vasopneumatic device, Traction, Ultrasound, Ionotophoresis '4mg'$ /ml Dexamethasone, and Manual therapy  PLAN FOR NEXT SESSION: Progress strengthening and ROM per protocol   Daleen Bo, PT 11/18/2021, 11:42 AM

## 2021-11-20 ENCOUNTER — Ambulatory Visit (HOSPITAL_BASED_OUTPATIENT_CLINIC_OR_DEPARTMENT_OTHER): Payer: 59 | Admitting: Physical Therapy

## 2021-11-20 ENCOUNTER — Other Ambulatory Visit (HOSPITAL_COMMUNITY): Payer: Self-pay

## 2021-11-20 MED ORDER — METFORMIN HCL ER 500 MG PO TB24
500.0000 mg | ORAL_TABLET | Freq: Every day | ORAL | 3 refills | Status: DC
  Filled 2021-11-20: qty 30, 30d supply, fill #0

## 2021-11-20 NOTE — Telephone Encounter (Signed)
Since pt only has metabolic syndrome and not official diabetes, it is up to her if she wants to take medication for this.  She wanted the Ozempic for the weight loss benefits.

## 2021-11-20 NOTE — Telephone Encounter (Signed)
Prescription for Metformin sent to [place]

## 2021-11-21 ENCOUNTER — Other Ambulatory Visit (HOSPITAL_COMMUNITY): Payer: Self-pay

## 2021-11-21 NOTE — Telephone Encounter (Signed)
Made pt aware that Rx was sent to pharmacy . Left a VM

## 2021-11-22 ENCOUNTER — Other Ambulatory Visit (HOSPITAL_BASED_OUTPATIENT_CLINIC_OR_DEPARTMENT_OTHER): Payer: Self-pay

## 2021-11-22 DIAGNOSIS — C50911 Malignant neoplasm of unspecified site of right female breast: Secondary | ICD-10-CM | POA: Diagnosis not present

## 2021-11-25 ENCOUNTER — Ambulatory Visit (HOSPITAL_BASED_OUTPATIENT_CLINIC_OR_DEPARTMENT_OTHER): Payer: 59 | Admitting: Physical Therapy

## 2021-11-28 ENCOUNTER — Encounter (HOSPITAL_BASED_OUTPATIENT_CLINIC_OR_DEPARTMENT_OTHER): Payer: Self-pay | Admitting: Physical Therapy

## 2021-11-28 ENCOUNTER — Ambulatory Visit (HOSPITAL_BASED_OUTPATIENT_CLINIC_OR_DEPARTMENT_OTHER): Payer: 59 | Admitting: Physical Therapy

## 2021-11-28 DIAGNOSIS — M25611 Stiffness of right shoulder, not elsewhere classified: Secondary | ICD-10-CM | POA: Diagnosis not present

## 2021-11-28 DIAGNOSIS — R600 Localized edema: Secondary | ICD-10-CM | POA: Diagnosis not present

## 2021-11-28 DIAGNOSIS — M6281 Muscle weakness (generalized): Secondary | ICD-10-CM

## 2021-11-28 DIAGNOSIS — M25511 Pain in right shoulder: Secondary | ICD-10-CM | POA: Diagnosis not present

## 2021-11-28 NOTE — Therapy (Signed)
OUTPATIENT PHYSICAL THERAPY SHOULDER TREATMENT   Patient Name: Katelyn Reyes MRN: [id number] DOB:[birth year], 60 y.o., female Today's Date: 11/28/2021   PT End of Session - 11/28/21 1558     Visit Number 18    Number of Visits 25    Date for PT Re-Evaluation 12/02/21    Authorization Type [employer]    PT Start Time 1600    PT Stop Time 1640    PT Time Calculation (min) 40 min    Activity Tolerance Patient tolerated treatment well    Behavior During Therapy [facility] for tasks assessed/performed                            Past Medical History:  Diagnosis Date   Allergic rhinitis    Anxiety    Arthritis    Asthma    related to seasonal allergies   Breast calcification, right 02/02/2013   Surgically excised 02/17/13 > path benign    Cancer ([place])    Depression    Family history of breast cancer 04/12/2021   Family history of ovarian cancer 04/12/2021   GERD (gastroesophageal reflux disease)    Hyperlipidemia    IUD 02/07/2010   removed   Rotator cuff tear    right   Past Surgical History:  Procedure Laterality Date   BREAST BIOPSY Right 02/17/2013   Procedure:  NEEDLE LOCALIZATION REMOVAL RIGHT BREAST CALCIFICATIONS;  Surgeon: Haywood Lasso, MD;  Location: [place];  Service: General;  Laterality: Right;   BREAST EXCISIONAL BIOPSY Right pt unsure   benign   BREAST RECONSTRUCTION WITH PLACEMENT OF TISSUE EXPANDER AND ALLODERM Bilateral 04/26/2021   Procedure: BREAST RECONSTRUCTION WITH PLACEMENT OF TISSUE EXPANDER AND ALLODERM;  Surgeon: Irene Limbo, MD;  Location: [place];  Service: Plastics;  Laterality: Bilateral;   BURCH PROCEDURE N/A 12/22/2013   Procedure: BURCH CYSTO URETHROPEXY, ANTERIOR AND POSTERIOR CULPORRHAPHY;  Surgeon: Ena Dawley, MD;  Location: [facility];  Service: Gynecology;  Laterality: N/A;   CERVICAL DISC ARTHROPLASTY N/A 11/12/2017   Procedure: ARTIFICIAL DISC REPLACEMENT CERVICAL  SIX-SEVEN;  Surgeon: Ashok Pall, MD;  Location: [place];  Service: Neurosurgery;  Laterality: N/A;  ARTIFICIAL DISC REPLACEMENT CERVICAL 6- CERVICAL 7   CHOLECYSTECTOMY N/A 10/09/2017   Procedure: LAPAROSCOPIC CHOLECYSTECTOMY WITH INTRAOPERATIVE CHOLANGIOGRAM;  Surgeon: Judeth Horn, MD;  Location: [place];  Service: General;  Laterality: N/A;   KNEE ARTHROSCOPY WITH MEDIAL MENISECTOMY Right 05/01/2020   Procedure: KNEE ARTHROSCOPY WITH PARTIAL MEDIAL MENISECTOMY,   PATELLA  CHONDROPLASTY;  Surgeon: Paralee Cancel, MD;  Location: [facility];  Service: Orthopedics;  Laterality: Right;   LIPOSUCTION WITH LIPOFILLING Bilateral 07/23/2021   Procedure: LIPOFILLING FROM ABDOMEN TO BILATERAL CHEST;  Surgeon: Irene Limbo, MD;  Location: [place];  Service: Plastics;  Laterality: Bilateral;   MASTECTOMY W/ SENTINEL NODE BIOPSY Right 04/26/2021   Procedure: RIGHT MASTECTOMY WITH RIGHT SENTINEL LYMPH NODE BIOPSY;  Surgeon: [name], MD;  Location: [place];  Service: General;  Laterality: Right;   MOUTH SURGERY     NASAL SINUS SURGERY     REMOVAL OF BILATERAL TISSUE EXPANDERS WITH PLACEMENT OF BILATERAL BREAST IMPLANTS Bilateral 07/23/2021   Procedure: REMOVAL OF BILATERAL TISSUE EXPANDERS WITH PLACEMENT OF BILATERAL BREAST SILICONE IMPLANTS;  Surgeon: Irene Limbo, MD;  Location: [place];  Service: Plastics;  Laterality: Bilateral;   SHOULDER ARTHROSCOPY WITH ROTATOR CUFF REPAIR AND OPEN BICEPS  TENODESIS Right 08/29/2021   Procedure: RIGHT SHOULDER ARTHROSCOPY WITH ROTATOR CUFF REPAIR WITH PATCH AUGMENTATION AND  BICEPS TENODESIS;  Surgeon: Vanetta [place]ders, MD;  Location: [place];  Service: Orthopedics;  Laterality: Right;   TOTAL MASTECTOMY Left 04/26/2021   Procedure: LEFT TOTAL MASTECTOMY;  Surgeon: [name], MD;  Location: [place];  Service: General;  Laterality: Left;   Patient  Active Problem List   Diagnosis Date Noted   Nontraumatic complete tear of right rotator cuff    Biceps tendinitis of right upper extremity    Breast cancer, right ([place]) 04/26/2021   Family history of breast cancer 04/12/2021   Malignant neoplasm of upper outer quadrant of female breast ([place]) 04/12/2021   Family history of colon cancer 04/12/2021   Family history of ovarian cancer 62/94/7654   Metabolic syndrome 65/08/5463   Diverticular disease of colon 07/17/2020   Family history of malignant neoplasm of gastrointestinal tract 07/17/2020   Fatty liver 68/05/7516   Nonalcoholic steatohepatitis (NASH) 07/17/2020   Personal history of colonic polyps 07/17/2020   Acute medial meniscal tear, right, subsequent encounter 05/01/2020   ETD (Eustachian tube dysfunction), bilateral 12/27/2019   Obesity (BMI 30-39.9) 07/12/2018   Cervical spondylosis with radiculopathy 11/12/2017   Vitamin D deficiency 07/07/2017   Urinary incontinence in female 12/22/2013   Asthma, mild intermittent 03/22/2013   Vaginal itching 06/01/2012   Thyromegaly 03/18/2012   General medical examination 03/14/2011   CHICKENPOX, HX OF 02/15/2010   Hyperlipidemia 02/14/2009   DEPRESSION 02/14/2009   ALLERGIC RHINITIS 02/14/2009    PCP: Midge Minium, MD  REFERRING PROVIDER: Midge Minium, MD  REFERRING DIAG:  [phone] (ICD-10-CM) - Complete rotator cuff tear or rupture of right shoulder, not specified as traumatic  M67.813 (ICD-10-CM) - Biceps tendonosis of right shoulder    THERAPY DIAG:  Muscle weakness (generalized)  Stiffness of right shoulder, not elsewhere classified  Localized edema  Acute pain of right shoulder   ONSET DATE: 08/29/21 Surgery   PROCEDURE: 1.  Right rotator cuff arthroscopic repair with collagen patch augmentation 2.  Right biceps tenodesis 3.  Right shoulder extensive debridement 4.  Right shoulder acromioplasty with subacromial decompression  SUBJECTIVE:                                                                                                                                                                                       SUBJECTIVE STATEMENT: Pt reports she had no issues after last session.  Pt is 13 wks at this time.    PERTINENT HISTORY: Breast cancer, double mastectomy  PAIN:  Are you having pain? 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 Complete tear of the supraspinatus tendon with 4 cm of retraction. 2. Severe tendinosis of the infraspinatus tendon with an interstitial tear. 3. Mild tendinosis of the intra-articular portion of the long head of the biceps tendon.   TODAY'S TREATMENT:   6/15  UBE retro and fwd 32mn each  -S/L ABD 2x10 -S/L flexion 2x10 -standing OHP no weight 2x10 -GTB rowing 3x10 -blue TB shoulder ext 3x10 -prone T 3x10 -S/L 2lb ER 3x10 -wall push up 2x10 -wall ball CW and CCW 20x  Grade III R GHJ inf and post mob Manual stretch to end range   6/5  UBE retro and fwd 259m each  -S/L ABD 1lb 10x, 10x without weight -supine flexion 3x10 -Supine protraction 3x10 1lb -supine shoulder ABC 2x 1lb weight -prone row 2lb 3x10 -Prone ext 3x10 -RTB rowing 3x10 -prone T 3x10 -S/L 1lb ER 3x10 -standing IR RTB 3x10    Previous: OH pulley's 2 min ABD and Flexion  -S/L ABD 2x10 -S/L flexion 2x10 -Supine AROM protraction 3x10 -supine shoulder ABC 2x 1lb weight -prone row 1lb 3x10 -RTB rowing 3x10 -prone T 3x10 -S/L 1lb ER 3x10 -standing IR RTB 3x10 -supine YTB horizontal ABD 2x10     PATIENT EDUCATION: Education  details: precautions/protocol, edema management, joint protection, exercise progression, muscle firing,  HEP,    Person educated: Patient Education method: Explanation, Demonstration, Tactile cues, Verbal cues, and Handouts Education comprehension: verbalized understanding, returned demonstration, verbal cues required, and tactile cues required   HOME EXERCISE PROGRAM: Access Code: [email or web address] URL: [email or web address] Date: 11/04/2021 Prepared by: AlDaleen Bo ASSESSMENT:  CLINICAL IMPRESSION: Pt with good tolerance to CKC motion as well as progression of R GHJ loading. However, pt quickly fatigues with direct RC exercise following compound motions. Plan to assess for intensity of soreness at next session and incorporate new exercise into HEP. Pt advised to add in 1lb weight to currently HEP and perform only 2-3x/week to mitigate chance of tendinitis related irritation. Pt would benefit from continued skilled therapy in order to reach goals and maximize functional R UE strength and ROM for full return to PLOF.     OBJECTIVE IMPAIRMENTS decreased ROM, decreased strength, hypomobility, increased fascial restrictions, increased muscle spasms, impaired flexibility, impaired UE functional use, improper body mechanics, postural dysfunction, and pain.   ACTIVITY LIMITATIONS cleaning, community activity, driving, meal prep, occupation, laundry, medication management, yard work, shopping, and exercise .   PERSONAL FACTORS Age, Behavior pattern, Fitness, Time since onset of injury/illness/exacerbation, and 1-2 comorbidities:    are also affecting patient's functional outcome.    REHAB POTENTIAL: Good  CLINICAL DECISION MAKING: Stable/uncomplicated  EVALUATION COMPLEXITY: Low   GOALS:   SHORT TERM GOALS: Target date: 01/09/2022  Pt will become independent with HEP in order to demonstrate synthesis of PT education.   Goal status: INITIAL  2.  Pt will be able to  demonstrate PROM to protocol limits in order to demonstrate functional improvement in R UE function for progression to next phase of protocol.   Goal status: INITIAL  3.  Pt will be able to demonstrate AAROM without pain in order to demonstrate functional improvement in UE  function for self-care and house hold duties.   Goal status: INITIAL  LONG TERM GOALS: Target date: 02/20/2022  Pt  will become independent with final HEP in order to demonstrate synthesis of PT education.   Goal status: INITIAL  2.  Pt will be able to demonstrate full A/PROM in order to demonstrate functional improvement in UE function for self-care and house hold duties.   Goal status: INITIAL  3.  Pt will be able to demonstrate ability to reach and set 1lb object onto shelf in order to demonstrate functional improvement in UE function for self-care and house hold duties.   Goal status: INITIAL  4.  Pt will be able to reach [facility] and carry/hold >5 lbs in order to demonstrate functional improvement in R UE strength for return to PLOF and exercise.   Goal status: INITIAL   PLAN: PT FREQUENCY: 1-2x/week  PT DURATION: 12 weeks   PLANNED INTERVENTIONS: Therapeutic exercises, Therapeutic activity, Neuromuscular re-education, Balance training, Gait training, Patient/Family education, Joint manipulation, Joint mobilization, Aquatic Therapy, Dry Needling, Electrical stimulation, Spinal manipulation, Spinal mobilization, Cryotherapy, Moist heat, scar mobilization, Taping, Vasopneumatic device, Traction, Ultrasound, Ionotophoresis '4mg'$ /ml Dexamethasone, and Manual therapy  PLAN FOR NEXT SESSION: Progress strengthening and ROM per protocol   Daleen Bo, PT 11/28/2021, 4:51 PM

## 2021-12-03 ENCOUNTER — Encounter (HOSPITAL_BASED_OUTPATIENT_CLINIC_OR_DEPARTMENT_OTHER): Payer: Self-pay | Admitting: Physical Therapy

## 2021-12-03 ENCOUNTER — Ambulatory Visit (HOSPITAL_BASED_OUTPATIENT_CLINIC_OR_DEPARTMENT_OTHER): Payer: 59 | Admitting: Physical Therapy

## 2021-12-03 DIAGNOSIS — M6281 Muscle weakness (generalized): Secondary | ICD-10-CM | POA: Diagnosis not present

## 2021-12-03 DIAGNOSIS — R600 Localized edema: Secondary | ICD-10-CM | POA: Diagnosis not present

## 2021-12-03 DIAGNOSIS — M25611 Stiffness of right shoulder, not elsewhere classified: Secondary | ICD-10-CM

## 2021-12-03 DIAGNOSIS — M25511 Pain in right shoulder: Secondary | ICD-10-CM

## 2021-12-03 NOTE — Therapy (Signed)
OUTPATIENT PHYSICAL THERAPY SHOULDER TREATMENT   Patient Name: Katelyn Reyes MRN: [id number] DOB:[birth year], 61 y.o., female Today's Date: 12/03/2021   PT End of Session - 12/03/21 1643     Visit Number 19    Number of Visits 25    Date for PT Re-Evaluation 12/02/21    Authorization Type [employer]    PT Start Time [phone]   arrives late   PT Stop Time 1639    PT Time Calculation (min) 27 min    Activity Tolerance Patient tolerated treatment well    Behavior During Therapy [facility] for tasks assessed/performed                             Past Medical History:  Diagnosis Date   Allergic rhinitis    Anxiety    Arthritis    Asthma    related to seasonal allergies   Breast calcification, right 02/02/2013   Surgically excised 02/17/13 > path benign    Cancer ([place])    Depression    Family history of breast cancer 04/12/2021   Family history of ovarian cancer 04/12/2021   GERD (gastroesophageal reflux disease)    Hyperlipidemia    IUD 02/07/2010   removed   Rotator cuff tear    right   Past Surgical History:  Procedure Laterality Date   BREAST BIOPSY Right 02/17/2013   Procedure:  NEEDLE LOCALIZATION REMOVAL RIGHT BREAST CALCIFICATIONS;  Surgeon: Haywood Lasso, MD;  Location: [place];  Service: General;  Laterality: Right;   BREAST EXCISIONAL BIOPSY Right pt unsure   benign   BREAST RECONSTRUCTION WITH PLACEMENT OF TISSUE EXPANDER AND ALLODERM Bilateral 04/26/2021   Procedure: BREAST RECONSTRUCTION WITH PLACEMENT OF TISSUE EXPANDER AND ALLODERM;  Surgeon: Irene Limbo, MD;  Location: [place];  Service: Plastics;  Laterality: Bilateral;   BURCH PROCEDURE N/A 12/22/2013   Procedure: BURCH CYSTO URETHROPEXY, ANTERIOR AND POSTERIOR CULPORRHAPHY;  Surgeon: Ena Dawley, MD;  Location: [place] [facility];  Service: Gynecology;  Laterality: N/A;   CERVICAL DISC ARTHROPLASTY N/A 11/12/2017   Procedure: ARTIFICIAL DISC  REPLACEMENT CERVICAL SIX-SEVEN;  Surgeon: Ashok Pall, MD;  Location: [place];  Service: Neurosurgery;  Laterality: N/A;  ARTIFICIAL DISC REPLACEMENT CERVICAL 6- CERVICAL 7   CHOLECYSTECTOMY N/A 10/09/2017   Procedure: LAPAROSCOPIC CHOLECYSTECTOMY WITH INTRAOPERATIVE CHOLANGIOGRAM;  Surgeon: Judeth Horn, MD;  Location: [place];  Service: General;  Laterality: N/A;   KNEE ARTHROSCOPY WITH MEDIAL MENISECTOMY Right 05/01/2020   Procedure: KNEE ARTHROSCOPY WITH PARTIAL MEDIAL MENISECTOMY,   PATELLA  CHONDROPLASTY;  Surgeon: Paralee Cancel, MD;  Location: [facility];  Service: Orthopedics;  Laterality: Right;   LIPOSUCTION WITH LIPOFILLING Bilateral 07/23/2021   Procedure: LIPOFILLING FROM ABDOMEN TO BILATERAL CHEST;  Surgeon: Irene Limbo, MD;  Location: [place];  Service: Plastics;  Laterality: Bilateral;   MASTECTOMY W/ SENTINEL NODE BIOPSY Right 04/26/2021   Procedure: RIGHT MASTECTOMY WITH RIGHT SENTINEL LYMPH NODE BIOPSY;  Surgeon: [name], MD;  Location: [place];  Service: General;  Laterality: Right;   MOUTH SURGERY     NASAL SINUS SURGERY     REMOVAL OF BILATERAL TISSUE EXPANDERS WITH PLACEMENT OF BILATERAL BREAST IMPLANTS Bilateral 07/23/2021   Procedure: REMOVAL OF BILATERAL TISSUE EXPANDERS WITH PLACEMENT OF BILATERAL BREAST SILICONE IMPLANTS;  Surgeon: Irene Limbo, MD;  Location: [place];  Service: Plastics;  Laterality: Bilateral;   SHOULDER ARTHROSCOPY WITH ROTATOR CUFF  REPAIR AND OPEN BICEPS TENODESIS Right 08/29/2021   Procedure: RIGHT SHOULDER ARTHROSCOPY WITH ROTATOR CUFF REPAIR WITH PATCH AUGMENTATION AND  BICEPS TENODESIS;  Surgeon: Vanetta [place]ders, MD;  Location: [place];  Service: Orthopedics;  Laterality: Right;   TOTAL MASTECTOMY Left 04/26/2021   Procedure: LEFT TOTAL MASTECTOMY;  Surgeon: [name], MD;  Location: [place];  Service: General;  Laterality:  Left;   Patient Active Problem List   Diagnosis Date Noted   Nontraumatic complete tear of right rotator cuff    Biceps tendinitis of right upper extremity    Breast cancer, right ([place]) 04/26/2021   Family history of breast cancer 04/12/2021   Malignant neoplasm of upper outer quadrant of female breast ([place]) 04/12/2021   Family history of colon cancer 04/12/2021   Family history of ovarian cancer 34/19/3790   Metabolic syndrome 24/02/7352   Diverticular disease of colon 07/17/2020   Family history of malignant neoplasm of gastrointestinal tract 07/17/2020   Fatty liver 29/92/4268   Nonalcoholic steatohepatitis (NASH) 07/17/2020   Personal history of colonic polyps 07/17/2020   Acute medial meniscal tear, right, subsequent encounter 05/01/2020   ETD (Eustachian tube dysfunction), bilateral 12/27/2019   Obesity (BMI 30-39.9) 07/12/2018   Cervical spondylosis with radiculopathy 11/12/2017   Vitamin D deficiency 07/07/2017   Urinary incontinence in female 12/22/2013   Asthma, mild intermittent 03/22/2013   Vaginal itching 06/01/2012   Thyromegaly 03/18/2012   General medical examination 03/14/2011   CHICKENPOX, HX OF 02/15/2010   Hyperlipidemia 02/14/2009   DEPRESSION 02/14/2009   ALLERGIC RHINITIS 02/14/2009    PCP: Midge Minium, MD  REFERRING PROVIDER: Midge Minium, MD  REFERRING DIAG:  [phone] (ICD-10-CM) - Complete rotator cuff tear or rupture of right shoulder, not specified as traumatic  M67.813 (ICD-10-CM) - Biceps tendonosis of right shoulder    THERAPY DIAG:  Muscle weakness (generalized)  Stiffness of right shoulder, not elsewhere classified  Localized edema  Acute pain of right shoulder   ONSET DATE: 08/29/21 Surgery   PROCEDURE: 1.  Right rotator cuff arthroscopic repair with collagen patch augmentation 2.  Right biceps tenodesis 3.  Right shoulder extensive debridement 4.  Right shoulder acromioplasty with subacromial  decompression  SUBJECTIVE:                                                                                                                                                                                      SUBJECTIVE STATEMENT: Pt reports she had no issues after last session. Pt had no excessive pain or soreness.  Pt is 13 wks at this time.    PERTINENT HISTORY: Breast cancer, double mastectomy  PAIN:  Are you having pain? 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 Complete tear of the supraspinatus tendon with 4 cm of retraction. 2. Severe tendinosis of the infraspinatus tendon with an interstitial tear. 3. Mild tendinosis of the intra-articular portion of the long head of the biceps tendon.   TODAY'S TREATMENT:   6/20  UBE retro and fwd 37mn each  -S/L ABD 2x10 -S/L flexion 1lb 2x10 -standing OHP no weight 2x10 -horizontal ABD YTB 2x10 -GTB TB shoulder ext 3x10 -GTB standing ER 2x10 -wall push up 3x10   6/15  UBE retro and fwd 254m each  -S/L ABD 2x10 -S/L flexion 2x10 -standing OHP no weight 2x10 -GTB rowing 3x10 -blue TB shoulder ext 3x10 -prone T 3x10 -S/L 2lb ER 3x10 -wall push up 2x10 -wall ball CW and CCW 20x  Grade III R GHJ inf and post mob Manual stretch to end range   6/5  UBE retro and fwd [id number]  -S/L ABD 1lb 10x, 10x without weight -supine flexion 3x10 -Supine protraction 3x10 1lb -supine shoulder ABC 2x 1lb weight -prone row 2lb 3x10 -Prone ext 3x10 -RTB rowing 3x10 -prone T 3x10 -S/L 1lb ER 3x10 -standing IR RTB 3x10    Previous: OH pulley's 2 min ABD  and Flexion  -S/L ABD 2x10 -S/L flexion 2x10 -Supine AROM protraction 3x10 -supine shoulder ABC 2x 1lb weight -prone row 1lb 3x10 -RTB rowing 3x10 -prone T 3x10 -S/L 1lb ER 3x10 -standing IR RTB 3x10 -supine YTB horizontal ABD 2x10     PATIENT EDUCATION: Education details: precautions/protocol, edema management, joint protection, exercise progression, muscle firing,  HEP,    Person educated: Patient Education method: Explanation, Demonstration, Tactile cues, Verbal cues, and Handouts Education comprehension: verbalized understanding, returned demonstration, verbal cues required, and tactile cues required   HOME EXERCISE PROGRAM: Access Code: [email or web address] URL: [email or web address] Date: 11/04/2021 Prepared by: AlaDaleen BoASSESSMENT:  CLINICAL IMPRESSION: Pt able to progress HEP at this time. Pt without pain during session with increased intensity but quickly fatigues with lifting motions in long lever position. Overall, volume was able to be increased but repetitions changed from 10 to 8 for better inter-set recovery. Pt able to progress scapular retraction motions as well as rotational strength. Plan to progress strength as able. Consider addition of body blade at next session. Pt would benefit from continued skilled therapy in order to reach goals and maximize functional R UE strength and ROM for full return to PLOF.     OBJECTIVE IMPAIRMENTS decreased ROM, decreased strength, hypomobility, increased fascial restrictions, increased muscle spasms, impaired flexibility, impaired UE functional use, improper body mechanics, postural dysfunction, and pain.   ACTIVITY LIMITATIONS cleaning, community activity, driving, meal prep, occupation, laundry, medication management, yard work, shopping, and exercise .   PERSONAL FACTORS Age, Behavior pattern, Fitness, Time since onset of injury/illness/exacerbation, and 1-2 comorbidities:    are also affecting patient's  functional outcome.    REHAB POTENTIAL: Good  CLINICAL DECISION MAKING: Stable/uncomplicated  EVALUATION COMPLEXITY: Low   GOALS:   SHORT TERM GOALS: Target date: 01/14/2022  Pt will become independent with HEP in order to demonstrate synthesis of PT education.  Goal status: INITIAL  2.  Pt will be able to demonstrate PROM to protocol limits in order to demonstrate functional improvement in R UE function for progression to next phase of protocol.   Goal status: INITIAL  3.  Pt will be able to demonstrate AAROM without pain in order to demonstrate functional improvement in UE function for self-care and house hold duties.   Goal status: INITIAL  LONG TERM GOALS: Target date: 02/25/2022  Pt  will become independent with final HEP in order to demonstrate synthesis of PT education.   Goal status: INITIAL  2.  Pt will be able to demonstrate full A/PROM in order to demonstrate functional improvement in UE function for self-care and house hold duties.   Goal status: INITIAL  3.  Pt will be able to demonstrate ability to reach and set 1lb object onto shelf in order to demonstrate functional improvement in UE function for self-care and house hold duties.   Goal status: INITIAL  4.  Pt will be able to reach [facility] and carry/hold >5 lbs in order to demonstrate functional improvement in R UE strength for return to PLOF and exercise.   Goal status: INITIAL   PLAN: PT FREQUENCY: 1-2x/week  PT DURATION: 12 weeks   PLANNED INTERVENTIONS: Therapeutic exercises, Therapeutic activity, Neuromuscular re-education, Balance training, Gait training, Patient/Family education, Joint manipulation, Joint mobilization, Aquatic Therapy, Dry Needling, Electrical stimulation, Spinal manipulation, Spinal mobilization, Cryotherapy, Moist heat, scar mobilization, Taping, Vasopneumatic device, Traction, Ultrasound, Ionotophoresis '4mg'$ /ml Dexamethasone, and Manual therapy  PLAN FOR NEXT SESSION: Progress  strengthening and ROM per protocol   Daleen Bo, PT 12/03/2021, 4:45 PM

## 2021-12-05 ENCOUNTER — Encounter (HOSPITAL_BASED_OUTPATIENT_CLINIC_OR_DEPARTMENT_OTHER): Payer: 59 | Admitting: Physical Therapy

## 2021-12-06 ENCOUNTER — Ambulatory Visit: Payer: 59 | Admitting: Family Medicine

## 2021-12-06 ENCOUNTER — Other Ambulatory Visit (HOSPITAL_COMMUNITY): Payer: Self-pay

## 2021-12-06 ENCOUNTER — Telehealth: Payer: Self-pay

## 2021-12-06 ENCOUNTER — Encounter: Payer: Self-pay | Admitting: Family Medicine

## 2021-12-06 VITALS — BP 118/82 | HR 92 | Temp 98.2°F | Resp 16 | Ht 62.0 in | Wt 186.0 lb

## 2021-12-06 DIAGNOSIS — E669 Obesity, unspecified: Secondary | ICD-10-CM

## 2021-12-06 DIAGNOSIS — E8881 Metabolic syndrome: Secondary | ICD-10-CM

## 2021-12-06 MED ORDER — CONTRAVE 8-90 MG PO TB12
ORAL_TABLET | ORAL | 0 refills | Status: AC
  Filled 2021-12-06: qty 120, 28d supply, fill #0
  Filled 2021-12-09: qty 120, 30d supply, fill #0

## 2021-12-06 NOTE — Assessment & Plan Note (Signed)
Pt's BMI is 34.02.  She already has metabolic syndrome and does not want to progress to diabetes like her parents.  She was hoping to lose weight w/ Ozempic but this is no longer covered.  She is following a low carb diet and is limited in her ability to exercise due to shoulder surgery.  Discussed other options- Saxenda, Contrave, Qsymia.  At this time, pt would like to try Contrave in addition to her low carb diet.  Will follow.

## 2021-12-09 ENCOUNTER — Other Ambulatory Visit (HOSPITAL_COMMUNITY): Payer: Self-pay

## 2021-12-09 NOTE — Telephone Encounter (Signed)
Informed pt that her Contrave Rx was approved from 12/07/21 till 10/ 22/ 2023 . Pharmacy aware as well .

## 2021-12-10 ENCOUNTER — Encounter (HOSPITAL_BASED_OUTPATIENT_CLINIC_OR_DEPARTMENT_OTHER): Payer: Self-pay | Admitting: Physical Therapy

## 2021-12-10 ENCOUNTER — Ambulatory Visit (HOSPITAL_BASED_OUTPATIENT_CLINIC_OR_DEPARTMENT_OTHER): Payer: 59 | Admitting: Physical Therapy

## 2021-12-10 DIAGNOSIS — R600 Localized edema: Secondary | ICD-10-CM | POA: Diagnosis not present

## 2021-12-10 DIAGNOSIS — M6281 Muscle weakness (generalized): Secondary | ICD-10-CM | POA: Diagnosis not present

## 2021-12-10 DIAGNOSIS — M25611 Stiffness of right shoulder, not elsewhere classified: Secondary | ICD-10-CM | POA: Diagnosis not present

## 2021-12-10 DIAGNOSIS — M25511 Pain in right shoulder: Secondary | ICD-10-CM

## 2021-12-10 NOTE — Therapy (Addendum)
OUTPATIENT PHYSICAL THERAPY SHOULDER TREATMENT   Patient Name: Katelyn Reyes MRN: [id number] DOB:06-28-1960, 61 y.o., female Today's Date: 12/10/2021   PT End of Session - 12/10/21 1610     Visit Number 20    Number of Visits 30    Date for PT Re-Evaluation 03/31/2022    Authorization Type [employer]    PT Start Time [phone]    PT Stop Time 1640    PT Time Calculation (min) 32 min    Activity Tolerance Patient tolerated treatment well    Behavior During Therapy [facility] for tasks assessed/performed                              Past Medical History:  Diagnosis Date   Allergic rhinitis    Anxiety    Arthritis    Asthma    related to seasonal allergies   Breast calcification, right 02/02/2013   Surgically excised 02/17/13 > path benign    Cancer ([place])    Depression    Family history of breast cancer 04/12/2021   Family history of ovarian cancer 04/12/2021   GERD (gastroesophageal reflux disease)    Hyperlipidemia    IUD 02/07/2010   removed   Rotator cuff tear    right   Past Surgical History:  Procedure Laterality Date   BREAST BIOPSY Right 02/17/2013   Procedure:  NEEDLE LOCALIZATION REMOVAL RIGHT BREAST CALCIFICATIONS;  Surgeon: Haywood Lasso, MD;  Location: [place];  Service: General;  Laterality: Right;   BREAST EXCISIONAL BIOPSY Right pt unsure   benign   BREAST RECONSTRUCTION WITH PLACEMENT OF TISSUE EXPANDER AND ALLODERM Bilateral 04/26/2021   Procedure: BREAST RECONSTRUCTION WITH PLACEMENT OF TISSUE EXPANDER AND ALLODERM;  Surgeon: Irene Limbo, MD;  Location: [place];  Service: Plastics;  Laterality: Bilateral;   BURCH PROCEDURE N/A 12/22/2013   Procedure: BURCH CYSTO URETHROPEXY, ANTERIOR AND POSTERIOR CULPORRHAPHY;  Surgeon: Ena Dawley, MD;  Location: [facility];  Service: Gynecology;  Laterality: N/A;   CERVICAL DISC ARTHROPLASTY N/A 11/12/2017   Procedure: ARTIFICIAL DISC REPLACEMENT  CERVICAL SIX-SEVEN;  Surgeon: Ashok Pall, MD;  Location: [place];  Service: Neurosurgery;  Laterality: N/A;  ARTIFICIAL DISC REPLACEMENT CERVICAL 6- CERVICAL 7   CHOLECYSTECTOMY N/A 10/09/2017   Procedure: LAPAROSCOPIC CHOLECYSTECTOMY WITH INTRAOPERATIVE CHOLANGIOGRAM;  Surgeon: Judeth Horn, MD;  Location: [place];  Service: General;  Laterality: N/A;   KNEE ARTHROSCOPY WITH MEDIAL MENISECTOMY Right 05/01/2020   Procedure: KNEE ARTHROSCOPY WITH PARTIAL MEDIAL MENISECTOMY,   PATELLA  CHONDROPLASTY;  Surgeon: Paralee Cancel, MD;  Location: [facility];  Service: Orthopedics;  Laterality: Right;   LIPOSUCTION WITH LIPOFILLING Bilateral 07/23/2021   Procedure: LIPOFILLING FROM ABDOMEN TO BILATERAL CHEST;  Surgeon: Irene Limbo, MD;  Location: [place];  Service: Plastics;  Laterality: Bilateral;   MASTECTOMY W/ SENTINEL NODE BIOPSY Right 04/26/2021   Procedure: RIGHT MASTECTOMY WITH RIGHT SENTINEL LYMPH NODE BIOPSY;  Surgeon: [name], MD;  Location: [place];  Service: General;  Laterality: Right;   MOUTH SURGERY     NASAL SINUS SURGERY     REMOVAL OF BILATERAL TISSUE EXPANDERS WITH PLACEMENT OF BILATERAL BREAST IMPLANTS Bilateral 07/23/2021   Procedure: REMOVAL OF BILATERAL TISSUE EXPANDERS WITH PLACEMENT OF BILATERAL BREAST SILICONE IMPLANTS;  Surgeon: Irene Limbo, MD;  Location: [place];  Service: Plastics;  Laterality: Bilateral;   SHOULDER ARTHROSCOPY WITH ROTATOR CUFF REPAIR AND  OPEN BICEPS TENODESIS Right 08/29/2021   Procedure: RIGHT SHOULDER ARTHROSCOPY WITH ROTATOR CUFF REPAIR WITH PATCH AUGMENTATION AND  BICEPS TENODESIS;  Surgeon: Vanetta Mulders, MD;  Location: [place];  Service: Orthopedics;  Laterality: Right;   TOTAL MASTECTOMY Left 04/26/2021   Procedure: LEFT TOTAL MASTECTOMY;  Surgeon: [name], MD;  Location: [place];  Service: General;  Laterality: Left;    Patient Active Problem List   Diagnosis Date Noted   Nontraumatic complete tear of right rotator cuff    Biceps tendinitis of right upper extremity    Breast cancer, right ([place]) 04/26/2021   Family history of breast cancer 04/12/2021   Malignant neoplasm of upper outer quadrant of female breast ([place]) 04/12/2021   Family history of colon cancer 04/12/2021   Family history of ovarian cancer 40/98/1191   Metabolic syndrome 47/82/9562   Diverticular disease of colon 07/17/2020   Family history of malignant neoplasm of gastrointestinal tract 07/17/2020   Fatty liver 13/01/6577   Nonalcoholic steatohepatitis ([facility]) 07/17/2020   Personal history of colonic polyps 07/17/2020   Acute medial meniscal tear, right, subsequent encounter 05/01/2020   ETD (Eustachian tube dysfunction), bilateral 12/27/2019   Obesity (BMI 30-39.9) 07/12/2018   Cervical spondylosis with radiculopathy 11/12/2017   Vitamin D deficiency 07/07/2017   Urinary incontinence in female 12/22/2013   Asthma, mild intermittent 03/22/2013   Vaginal itching 06/01/2012   Thyromegaly 03/18/2012   General medical examination 03/14/2011   CHICKENPOX, HX OF 02/15/2010   Hyperlipidemia 02/14/2009   DEPRESSION 02/14/2009   ALLERGIC RHINITIS 02/14/2009    PCP: Midge Minium, MD  REFERRING PROVIDER: Midge Minium, MD  REFERRING DIAG:  [phone] (ICD-10-CM) - Complete rotator cuff tear or rupture of right shoulder, not specified as traumatic  M67.813 (ICD-10-CM) - Biceps tendonosis of right shoulder    THERAPY DIAG:  Muscle weakness (generalized)  Stiffness of right shoulder, not elsewhere classified  Localized edema  Acute pain of right shoulder   ONSET DATE: 08/29/21 Surgery   PROCEDURE: 1.  Right rotator cuff arthroscopic repair with collagen patch augmentation 2.  Right biceps tenodesis 3.  Right shoulder extensive debridement 4.  Right shoulder acromioplasty with subacromial  decompression  SUBJECTIVE:                                                                                                                                                                                      SUBJECTIVE STATEMENT: Pt reports she had no issues after last session.  Pt is 14 wks at this time.  Pt has not added weight to home HEP as of yet.  PERTINENT HISTORY: Breast cancer, double mastectomy  PAIN:  Are you having pain? 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 Complete tear of the supraspinatus tendon with 4 cm of retraction. 2. Severe tendinosis of the infraspinatus tendon with an interstitial tear. 3. Mild tendinosis of the intra-articular portion of the long head of the biceps tendon.   UPPER EXTREMITY ROM:    AROM 5/11 PROM 5/11 6/27 AROM  Shoulder flexion 60 165 162 supine  Shoulder extension       Shoulder abduction 75 150 140  Shoulder adduction       Shoulder internal rotation To belly belly T12  BHB reach  Shoulder external rotation 50  55 T2 BHB reach   (Blank rows = not tested)   UPPER EXTREMITY MMT:  6/27 UE HHD      L     R        Shoulder flexion 36.1 lbs 11.4  Shoulder extension    Shoulder abduction 19.7 11.5  Shoulder adduction    Shoulder internal rotation 15.1 15.6  Shoulder external rotation 16.0 10.0     TODAY'S TREATMENT:   6/27  UBE retro and fwd warm up  Joint mobilization: Post and inf grade III R GHJ STM R rhomboid/mid trap (active spasm)  6/20  UBE retro and fwd 75mn each  -S/L ABD 2x10 -S/L flexion 1lb 2x10 -standing OHP no weight  2x10 -horizontal ABD YTB 2x10 -GTB TB shoulder ext 3x10 -GTB standing ER 2x10 -wall push up 3x10   6/15  UBE retro and fwd 255m each  -S/L ABD 2x10 -S/L flexion 2x10 -standing OHP no weight 2x10 -GTB rowing 3x10 -blue TB shoulder ext 3x10 -prone T 3x10 -S/L 2lb ER 3x10 -wall push up 2x10 -wall ball CW and CCW 20x  Grade III R GHJ inf and post mob Manual stretch to end range   6/5  UBE retro and fwd 32m24meach  -S/L ABD 1lb 10x, 10x without weight -supine flexion 3x10 -Supine protraction 3x10 1lb -supine shoulder ABC 2x 1lb weight -prone row 2lb 3x10 -Prone ext 3x10 -RTB rowing 3x10 -prone T 3x10 -S/L 1lb ER 3x10 -standing IR RTB 3x10    Previous: OH pulley's 2 min ABD and Flexion  -S/L ABD 2x10 -S/L flexion 2x10 -Supine AROM protraction 3x10 -supine shoulder ABC 2x 1lb weight -prone row 1lb 3x10 -RTB rowing 3x10 -prone T 3x10 -S/L 1lb ER 3x10 -standing IR RTB 3x10 -supine YTB horizontal ABD 2x10     PATIENT EDUCATION: Education details: exam findings, exercise focus, deficits/gains, exercise progression, muscle firing,  HEP modifications   Person educated: Patient Education method: Explanation, Demonstration, Tactile cues, Verbal cues, and Handouts Education comprehension: verbalized understanding, returned demonstration, verbal cues required, and tactile cues required   HOME EXERCISE PROGRAM: Access Code: [email or web address] URL: [email or web address] Date: 11/04/2021 Prepared by: AlaDaleen BoASSESSMENT:  CLINICAL IMPRESSION: Pt able to reach symmetrical end range with gravity assist but difficulty with lifting due to R shoulder weakness. Pt also has improved strength of shoulder as compared to last assessment but is less than 50% strength with flexion. Pt advised to increase resistance with HEP in order to progress strength. Pt with very good ROM progression  but is currently strength limited. Plan to continue focusing on strength at  reduce frequency of visits. Pt would benefit from continued skilled therapy in order to reach goals and maximize functional R UE strength and ROM for full return to PLOF.     OBJECTIVE IMPAIRMENTS decreased ROM, decreased strength, hypomobility, increased fascial restrictions, increased muscle spasms, impaired flexibility, impaired UE functional use, improper body mechanics, postural dysfunction, and pain.   ACTIVITY LIMITATIONS cleaning, community activity, driving, meal prep, occupation, laundry, medication management, yard work, shopping, and exercise .   PERSONAL FACTORS Age, Behavior pattern, Fitness, Time since onset of injury/illness/exacerbation, and 1-2 comorbidities:    are also affecting patient's functional outcome.    REHAB POTENTIAL: Good  CLINICAL DECISION MAKING: Stable/uncomplicated  EVALUATION COMPLEXITY: Low   GOALS:   SHORT TERM GOALS: Target date: 01/21/2022  Pt will become independent with HEP in order to demonstrate synthesis of PT education.   Goal status: MET  2.  Pt will be able to demonstrate PROM to protocol limits in order to demonstrate functional improvement in R UE function for progression to next phase of protocol.   Goal status: MET  3.  Pt will be able to demonstrate AAROM without pain in order to demonstrate functional improvement in UE function for self-care and house hold duties.   Goal status: MET  LONG TERM GOALS: Target date: 03/31/22  Pt  will become independent with final HEP in order to demonstrate synthesis of PT education.   Goal status: ongoing  2.  Pt will be able to demonstrate full A/PROM in order to demonstrate functional improvement in UE function for self-care and house hold duties.   Goal status: partially met with PROM  3.  Pt will be able to demonstrate ability to reach and set 1lb object onto shelf in order to demonstrate functional improvement in UE function for self-care and house hold duties.   Goal status:  ongoing  4.  Pt will be able to reach [facility] and carry/hold >5 lbs in order to demonstrate functional improvement in R UE strength for return to PLOF and exercise.   Goal status: ongoing   PLAN: PT FREQUENCY: 1-2x/week  PT DURATION: 12 weeks   PLANNED INTERVENTIONS: Therapeutic exercises, Therapeutic activity, Neuromuscular re-education, Balance training, Gait training, Patient/Family education, Joint manipulation, Joint mobilization, Aquatic Therapy, Dry Needling, Electrical stimulation, Spinal manipulation, Spinal mobilization, Cryotherapy, Moist heat, scar mobilization, Taping, Vasopneumatic device, Traction, Ultrasound, Ionotophoresis 60m/ml Dexamethasone, and Manual therapy  PLAN FOR NEXT SESSION: Progress strengthening   ADaleen Bo PT 12/10/2021, 4:46 PM

## 2021-12-12 ENCOUNTER — Encounter (HOSPITAL_BASED_OUTPATIENT_CLINIC_OR_DEPARTMENT_OTHER): Payer: 59 | Admitting: Physical Therapy

## 2021-12-12 ENCOUNTER — Telehealth: Payer: Self-pay | Admitting: Orthopaedic Surgery

## 2021-12-12 NOTE — Telephone Encounter (Signed)
Pt paid $25.00 cash payment to [employer] 12/12/2021/ Medical accomodation forms dropped off 12/11/2021

## 2021-12-13 ENCOUNTER — Telehealth: Payer: Self-pay | Admitting: Orthopaedic Surgery

## 2021-12-13 NOTE — Telephone Encounter (Signed)
Called patient got recording mailbox is full    could not leave message      Note: Patient was to bring $25.00 for disability paperwork/will ask [employer] to send letter to patient/Forwarding paperwork to [facility] today

## 2021-12-16 ENCOUNTER — Ambulatory Visit (HOSPITAL_BASED_OUTPATIENT_CLINIC_OR_DEPARTMENT_OTHER): Payer: 59 | Admitting: Physical Therapy

## 2021-12-19 ENCOUNTER — Encounter (HOSPITAL_BASED_OUTPATIENT_CLINIC_OR_DEPARTMENT_OTHER): Payer: 59 | Admitting: Physical Therapy

## 2021-12-24 ENCOUNTER — Other Ambulatory Visit (HOSPITAL_COMMUNITY): Payer: Self-pay

## 2021-12-24 ENCOUNTER — Other Ambulatory Visit: Payer: Self-pay | Admitting: Family Medicine

## 2021-12-24 NOTE — Telephone Encounter (Signed)
Patient is requesting a refill of the following medications: Requested Prescriptions   Pending Prescriptions Disp Refills   montelukast (SINGULAIR) 10 MG tablet 90 tablet 1    Sig: TAKE 1 TABLET (10 MG TOTAL) BY MOUTH DAILY.   ALPRAZolam (XANAX) 0.5 MG tablet 60 tablet 3    Sig: Take 1 tablet (0.5 mg total) by mouth 3 (three) times daily as needed.   zolpidem (AMBIEN) 10 MG tablet 30 tablet 3    Sig: Take 1 tablet (10 mg total) by mouth at bedtime as needed for sleep.    Date of patient request: 12/24/21 Last office visit: 12/06/21 Date of last refill: 03/21/21, 12/06/21 Last refill amount: 60, 30

## 2021-12-25 ENCOUNTER — Other Ambulatory Visit: Payer: Self-pay

## 2021-12-25 ENCOUNTER — Other Ambulatory Visit (HOSPITAL_COMMUNITY): Payer: Self-pay

## 2021-12-25 MED ORDER — ZOLPIDEM TARTRATE 10 MG PO TABS
10.0000 mg | ORAL_TABLET | Freq: Every evening | ORAL | 3 refills | Status: DC | PRN
  Filled 2021-12-25: qty 30, 30d supply, fill #0
  Filled 2022-03-06: qty 30, 30d supply, fill #1
  Filled 2022-04-21: qty 30, 30d supply, fill #2
  Filled 2022-05-20: qty 30, 30d supply, fill #3

## 2021-12-25 MED ORDER — ALPRAZOLAM 0.5 MG PO TABS
0.5000 mg | ORAL_TABLET | Freq: Three times a day (TID) | ORAL | 3 refills | Status: DC | PRN
  Filled 2021-12-25: qty 60, 20d supply, fill #0
  Filled 2022-02-07: qty 60, 20d supply, fill #1
  Filled 2022-04-30: qty 60, 20d supply, fill #2

## 2021-12-25 MED ORDER — MONTELUKAST SODIUM 10 MG PO TABS
10.0000 mg | ORAL_TABLET | Freq: Every day | ORAL | 1 refills | Status: DC
  Filled 2021-12-25: qty 90, 90d supply, fill #0
  Filled 2022-05-20: qty 90, 90d supply, fill #1

## 2021-12-27 ENCOUNTER — Other Ambulatory Visit (HOSPITAL_COMMUNITY): Payer: Self-pay

## 2021-12-31 ENCOUNTER — Ambulatory Visit (HOSPITAL_BASED_OUTPATIENT_CLINIC_OR_DEPARTMENT_OTHER): Payer: 59 | Attending: Orthopaedic Surgery | Admitting: Physical Therapy

## 2021-12-31 ENCOUNTER — Encounter (HOSPITAL_BASED_OUTPATIENT_CLINIC_OR_DEPARTMENT_OTHER): Payer: Self-pay | Admitting: Physical Therapy

## 2021-12-31 DIAGNOSIS — M25511 Pain in right shoulder: Secondary | ICD-10-CM | POA: Diagnosis not present

## 2021-12-31 DIAGNOSIS — R600 Localized edema: Secondary | ICD-10-CM | POA: Diagnosis not present

## 2021-12-31 DIAGNOSIS — M6281 Muscle weakness (generalized): Secondary | ICD-10-CM | POA: Diagnosis not present

## 2021-12-31 DIAGNOSIS — M25611 Stiffness of right shoulder, not elsewhere classified: Secondary | ICD-10-CM | POA: Diagnosis not present

## 2021-12-31 NOTE — Therapy (Signed)
OUTPATIENT PHYSICAL THERAPY SHOULDER TREATMENT   Patient Name: Katelyn Reyes MRN: [id number] DOB:1960-10-07, 61 y.o., female Today's Date: 12/31/2021   PT End of Session - 12/31/21 0817     Visit Number 21    Number of Visits 25    Date for PT Re-Evaluation 03/31/22    Authorization Type [employer]    PT Start Time 0803    PT Stop Time [phone]    PT Time Calculation (min) 40 min    Activity Tolerance Patient tolerated treatment well    Behavior During Therapy [facility] for tasks assessed/performed                               Past Medical History:  Diagnosis Date   Allergic rhinitis    Anxiety    Arthritis    Asthma    related to seasonal allergies   Breast calcification, right 02/02/2013   Surgically excised 02/17/13 > path benign    Cancer ([place])    Depression    Family history of breast cancer 04/12/2021   Family history of ovarian cancer 04/12/2021   GERD (gastroesophageal reflux disease)    Hyperlipidemia    IUD 02/07/2010   removed   Rotator cuff tear    right   Past Surgical History:  Procedure Laterality Date   BREAST BIOPSY Right 02/17/2013   Procedure:  NEEDLE LOCALIZATION REMOVAL RIGHT BREAST CALCIFICATIONS;  Surgeon: Haywood Lasso, MD;  Location: [place];  Service: General;  Laterality: Right;   BREAST EXCISIONAL BIOPSY Right pt unsure   benign   BREAST RECONSTRUCTION WITH PLACEMENT OF TISSUE EXPANDER AND ALLODERM Bilateral 04/26/2021   Procedure: BREAST RECONSTRUCTION WITH PLACEMENT OF TISSUE EXPANDER AND ALLODERM;  Surgeon: Irene Limbo, MD;  Location: [place];  Service: Plastics;  Laterality: Bilateral;   BURCH PROCEDURE N/A 12/22/2013   Procedure: BURCH CYSTO URETHROPEXY, ANTERIOR AND POSTERIOR CULPORRHAPHY;  Surgeon: Ena Dawley, MD;  Location: [place] [facility];  Service: Gynecology;  Laterality: N/A;   CERVICAL DISC ARTHROPLASTY N/A 11/12/2017   Procedure: ARTIFICIAL DISC REPLACEMENT  CERVICAL SIX-SEVEN;  Surgeon: Ashok Pall, MD;  Location: [place];  Service: Neurosurgery;  Laterality: N/A;  ARTIFICIAL DISC REPLACEMENT CERVICAL 6- CERVICAL 7   CHOLECYSTECTOMY N/A 10/09/2017   Procedure: LAPAROSCOPIC CHOLECYSTECTOMY WITH INTRAOPERATIVE CHOLANGIOGRAM;  Surgeon: Judeth Horn, MD;  Location: [place];  Service: General;  Laterality: N/A;   KNEE ARTHROSCOPY WITH MEDIAL MENISECTOMY Right 05/01/2020   Procedure: KNEE ARTHROSCOPY WITH PARTIAL MEDIAL MENISECTOMY,   PATELLA  CHONDROPLASTY;  Surgeon: Paralee Cancel, MD;  Location: [facility];  Service: Orthopedics;  Laterality: Right;   LIPOSUCTION WITH LIPOFILLING Bilateral 07/23/2021   Procedure: LIPOFILLING FROM ABDOMEN TO BILATERAL CHEST;  Surgeon: Irene Limbo, MD;  Location: [place];  Service: Plastics;  Laterality: Bilateral;   MASTECTOMY W/ SENTINEL NODE BIOPSY Right 04/26/2021   Procedure: RIGHT MASTECTOMY WITH RIGHT SENTINEL LYMPH NODE BIOPSY;  Surgeon: [name], MD;  Location: [place];  Service: General;  Laterality: Right;   MOUTH SURGERY     NASAL SINUS SURGERY     REMOVAL OF BILATERAL TISSUE EXPANDERS WITH PLACEMENT OF BILATERAL BREAST IMPLANTS Bilateral 07/23/2021   Procedure: REMOVAL OF BILATERAL TISSUE EXPANDERS WITH PLACEMENT OF BILATERAL BREAST SILICONE IMPLANTS;  Surgeon: Irene Limbo, MD;  Location: [place];  Service: Plastics;  Laterality: Bilateral;   SHOULDER ARTHROSCOPY WITH ROTATOR CUFF REPAIR  AND OPEN BICEPS TENODESIS Right 08/29/2021   Procedure: RIGHT SHOULDER ARTHROSCOPY WITH ROTATOR CUFF REPAIR WITH PATCH AUGMENTATION AND  BICEPS TENODESIS;  Surgeon: Vanetta Mulders, MD;  Location: [place];  Service: Orthopedics;  Laterality: Right;   TOTAL MASTECTOMY Left 04/26/2021   Procedure: LEFT TOTAL MASTECTOMY;  Surgeon: [name], MD;  Location: [place];  Service: General;  Laterality: Left;    Patient Active Problem List   Diagnosis Date Noted   Nontraumatic complete tear of right rotator cuff    Biceps tendinitis of right upper extremity    Breast cancer, right ([place]) 04/26/2021   Family history of breast cancer 04/12/2021   Malignant neoplasm of upper outer quadrant of female breast ([place]) 04/12/2021   Family history of colon cancer 04/12/2021   Family history of ovarian cancer 32/95/1884   Metabolic syndrome 16/60/6301   Diverticular disease of colon 07/17/2020   Family history of malignant neoplasm of gastrointestinal tract 07/17/2020   Fatty liver 60/03/9322   Nonalcoholic steatohepatitis ([facility]) 07/17/2020   Personal history of colonic polyps 07/17/2020   Acute medial meniscal tear, right, subsequent encounter 05/01/2020   ETD (Eustachian tube dysfunction), bilateral 12/27/2019   Obesity (BMI 30-39.9) 07/12/2018   Cervical spondylosis with radiculopathy 11/12/2017   Vitamin D deficiency 07/07/2017   Urinary incontinence in female 12/22/2013   Asthma, mild intermittent 03/22/2013   Vaginal itching 06/01/2012   Thyromegaly 03/18/2012   General medical examination 03/14/2011   CHICKENPOX, HX OF 02/15/2010   Hyperlipidemia 02/14/2009   DEPRESSION 02/14/2009   ALLERGIC RHINITIS 02/14/2009    PCP: Midge Minium, MD  REFERRING PROVIDER: Midge Minium, MD  REFERRING DIAG:  [phone] (ICD-10-CM) - Complete rotator cuff tear or rupture of right shoulder, not specified as traumatic  M67.813 (ICD-10-CM) - Biceps tendonosis of right shoulder    THERAPY DIAG:  Muscle weakness (generalized)  Stiffness of right shoulder, not elsewhere classified  Localized edema  Acute pain of right shoulder   ONSET DATE: 08/29/21 Surgery   PROCEDURE: 1.  Right rotator cuff arthroscopic repair with collagen patch augmentation 2.  Right biceps tenodesis 3.  Right shoulder extensive debridement 4.  Right shoulder acromioplasty with subacromial  decompression  SUBJECTIVE:                                                                                                                                                                                      SUBJECTIVE STATEMENT: Pt reports she had no issues after last session.  Pt is 16 wks at this time.  Pt has not added weight to home HEP as of yet.  PERTINENT HISTORY: Breast cancer, double  mastectomy  PAIN:  Are you having pain? 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 Complete tear of the supraspinatus tendon with 4 cm of retraction. 2. Severe tendinosis of the infraspinatus tendon with an interstitial tear. 3. Mild tendinosis of the intra-articular portion of the long head of the biceps tendon.   UPPER EXTREMITY ROM:    AROM 5/11 PROM 5/11 6/27 AROM  Shoulder flexion 60 165 162 supine  Shoulder extension       Shoulder abduction 75 150 140  Shoulder adduction       Shoulder internal rotation To belly belly T12  BHB reach  Shoulder external rotation 50  55 T2 BHB reach   (Blank rows = not tested)   UPPER EXTREMITY MMT:  6/27 UE HHD      L     R        Shoulder flexion 36.1 lbs 11.4  Shoulder extension    Shoulder abduction 19.7 11.5  Shoulder adduction    Shoulder internal rotation 15.1 15.6  Shoulder external rotation 16.0 10.0     TODAY'S TREATMENT:   7/18  Joint mobilization: Post and inf grade III R GHJ  -S/L ABD 3x10 1lb S/L ER 2lb 2x10 -S/L flexion 1lb 2x10 -standing OHP no weight 1 lb 2x10 -horizontal ABD YTB 2x10 - blue TB TB rowing 2x10 -YTB bilat ER 2x10 -table push up  3x10   6/27  UBE retro and fwd warm up  Joint mobilization: Post and inf grade III R GHJ STM R rhomboid/mid trap (active spasm)  6/20  UBE retro and fwd 27mn each  -S/L ABD 2x10 -S/L flexion 1lb 2x10 -standing OHP no weight 2x10 -horizontal ABD YTB 2x10 -GTB TB shoulder ext 3x10 -GTB standing ER 2x10 -wall push up 3x10   6/15  UBE retro and fwd 2566m each  -S/L ABD 2x10 -S/L flexion 2x10 -standing OHP no weight 2x10 -GTB rowing 3x10 -blue TB shoulder ext 3x10 -prone T 3x10 -S/L 2lb ER 3x10 -wall push up 2x10 -wall ball CW and CCW 20x  Grade III R GHJ inf and post mob Manual stretch to end range   6/5  UBE retro and fwd [id number]  -S/L ABD 1lb 10x, 10x without weight -supine flexion 3x10 -Supine protraction 3x10 1lb -supine shoulder ABC 2x 1lb weight -prone row 2lb 3x10 -Prone ext 3x10 -RTB rowing 3x10 -prone T 3x10 -S/L 1lb ER 3x10 -standing IR RTB 3x10    Previous: OH pulley's 2 min ABD and Flexion  -S/L ABD 2x10 -S/L flexion 2x10 -Supine AROM protraction 3x10 -supine shoulder ABC 2x 1lb weight -prone row 1lb 3x10 -RTB rowing 3x10 -prone T 3x10 -S/L 1lb ER 3x10 -standing IR RTB 3x10 -supine YTB horizontal ABD 2x10     PATIENT EDUCATION: Education details: exam findings, exercise focus, deficits/gains, exercise progression, muscle firing,  HEP modifications   Person educated: Patient Education method: Explanation, Demonstration, Tactile cues, Verbal cues, and Handouts Education comprehension: verbalized understanding, returned demonstration, verbal cues required, and tactile cues required   HOME EXERCISE PROGRAM: Access Code: [email or web address] URL: [email or web address] Date: 12/31/2021 Prepared by: AlaDaleen Boxercises - Standing Shoulder Scaption  - 1 x  daily - 2 x weekly - 3 sets - 8 reps - Sidelying Shoulder Abduction Palm Forward  - 1 x daily - 2 x weekly - 3 sets - 8 reps - Shoulder Overhead Press in Flexion with  Dumbbells  - 1 x daily - 2 x weekly - 3 sets - 8 reps - Shoulder External Rotation with Anchored Resistance  - 1 x daily - 2 x weekly - 3 sets - 8 reps - Standing Shoulder Row with Anchored Resistance  - 1 x daily - 2 x weekly - 3 sets - 10 reps   ASSESSMENT:  CLINICAL IMPRESSION: Pt with improvement in strength since last session but continues to have lack of RC that is likely due to mixed compliance with HEP. Plan to continue focusing on strength at reduce frequency of visits. Pt HEP updated/trimmed in order to promote better independent exercise. Pt ROM symmetrical at this time. Pt would benefit from continued skilled therapy in order to reach goals and maximize functional R UE strength and ROM for full return to PLOF.     OBJECTIVE IMPAIRMENTS decreased ROM, decreased strength, hypomobility, increased fascial restrictions, increased muscle spasms, impaired flexibility, impaired UE functional use, improper body mechanics, postural dysfunction, and pain.   ACTIVITY LIMITATIONS cleaning, community activity, driving, meal prep, occupation, laundry, medication management, yard work, shopping, and exercise .   PERSONAL FACTORS Age, Behavior pattern, Fitness, Time since onset of injury/illness/exacerbation, and 1-2 comorbidities:    are also affecting patient's functional outcome.    REHAB POTENTIAL: Good  CLINICAL DECISION MAKING: Stable/uncomplicated  EVALUATION COMPLEXITY: Low   GOALS:   SHORT TERM GOALS: Target date: 02/11/2022  Pt will become independent with HEP in order to demonstrate synthesis of PT education.   Goal status: MET  2.  Pt will be able to demonstrate PROM to protocol limits in order to demonstrate functional improvement in R UE function for progression to next phase of protocol.   Goal status: MET  3.  Pt will be able to demonstrate AAROM without pain in order to demonstrate functional improvement in UE function for self-care and house hold duties.   Goal  status: MET  LONG TERM GOALS: Target date: 03/25/2022  Pt  will become independent with final HEP in order to demonstrate synthesis of PT education.   Goal status: ongoing  2.  Pt will be able to demonstrate full A/PROM in order to demonstrate functional improvement in UE function for self-care and house hold duties.   Goal status: partially met with PROM  3.  Pt will be able to demonstrate ability to reach and set 1lb object onto shelf in order to demonstrate functional improvement in UE function for self-care and house hold duties.   Goal status: ongoing  4.  Pt will be able to reach [facility] and carry/hold >5 lbs in order to demonstrate functional improvement in R UE strength for return to PLOF and exercise.   Goal status: ongoing   PLAN: PT FREQUENCY: 1-2x/week  PT DURATION: 12 weeks   PLANNED INTERVENTIONS: Therapeutic exercises, Therapeutic activity, Neuromuscular re-education, Balance training, Gait training, Patient/Family education, Joint manipulation, Joint mobilization, Aquatic Therapy, Dry Needling, Electrical stimulation, Spinal manipulation, Spinal mobilization, Cryotherapy, Moist heat, scar mobilization, Taping, Vasopneumatic device, Traction, Ultrasound, Ionotophoresis 71m/ml Dexamethasone, and Manual therapy  PLAN FOR NEXT SESSION: Progress strengthening   ADaleen Bo PT 12/31/2021, 9:20 AM

## 2021-12-31 NOTE — Addendum Note (Signed)
Addended by: Daleen Bo on: 12/31/2021 08:21 AM   Modules accepted: Orders

## 2022-01-07 ENCOUNTER — Ambulatory Visit (HOSPITAL_BASED_OUTPATIENT_CLINIC_OR_DEPARTMENT_OTHER): Payer: 59 | Admitting: Physical Therapy

## 2022-01-08 ENCOUNTER — Other Ambulatory Visit (HOSPITAL_COMMUNITY): Payer: Self-pay

## 2022-01-08 ENCOUNTER — Other Ambulatory Visit: Payer: Self-pay | Admitting: Family Medicine

## 2022-01-08 MED ORDER — FLUTICASONE PROPIONATE 50 MCG/ACT NA SUSP
2.0000 | Freq: Every day | NASAL | 6 refills | Status: DC
Start: 2022-01-08 — End: 2022-07-21
  Filled 2022-01-08: qty 16, 30d supply, fill #0
  Filled 2022-05-20: qty 16, 30d supply, fill #1

## 2022-01-08 MED ORDER — CETIRIZINE HCL 10 MG PO TABS
10.0000 mg | ORAL_TABLET | Freq: Every day | ORAL | 1 refills | Status: AC
  Filled 2022-01-08: qty 90, 90d supply, fill #0

## 2022-01-08 MED ORDER — ALBUTEROL SULFATE HFA 108 (90 BASE) MCG/ACT IN AERS
2.0000 | INHALATION_SPRAY | Freq: Four times a day (QID) | RESPIRATORY_TRACT | 6 refills | Status: DC | PRN
  Filled 2022-01-08 (×2): qty 18, 25d supply, fill #0

## 2022-01-08 MED ORDER — SERTRALINE HCL 100 MG PO TABS
100.0000 mg | ORAL_TABLET | Freq: Every day | ORAL | 1 refills | Status: DC
  Filled 2022-01-08: qty 90, 90d supply, fill #0
  Filled 2022-04-30: qty 90, 90d supply, fill #1

## 2022-01-10 ENCOUNTER — Inpatient Hospital Stay: Payer: 59 | Attending: Hematology & Oncology

## 2022-01-10 ENCOUNTER — Encounter: Payer: Self-pay | Admitting: Hematology & Oncology

## 2022-01-10 ENCOUNTER — Inpatient Hospital Stay (HOSPITAL_BASED_OUTPATIENT_CLINIC_OR_DEPARTMENT_OTHER): Payer: 59 | Admitting: Hematology & Oncology

## 2022-01-10 ENCOUNTER — Other Ambulatory Visit: Payer: Self-pay

## 2022-01-10 ENCOUNTER — Other Ambulatory Visit (HOSPITAL_COMMUNITY): Payer: Self-pay

## 2022-01-10 ENCOUNTER — Encounter: Payer: Self-pay | Admitting: *Deleted

## 2022-01-10 VITALS — BP 97/73 | HR 78 | Temp 98.0°F | Resp 18 | Ht 62.0 in | Wt 190.0 lb

## 2022-01-10 DIAGNOSIS — C50911 Malignant neoplasm of unspecified site of right female breast: Secondary | ICD-10-CM | POA: Diagnosis not present

## 2022-01-10 DIAGNOSIS — Z170 Estrogen receptor positive status [ER+]: Secondary | ICD-10-CM | POA: Insufficient documentation

## 2022-01-10 DIAGNOSIS — C50411 Malignant neoplasm of upper-outer quadrant of right female breast: Secondary | ICD-10-CM

## 2022-01-10 DIAGNOSIS — Z79811 Long term (current) use of aromatase inhibitors: Secondary | ICD-10-CM | POA: Diagnosis not present

## 2022-01-10 LAB — CBC WITH DIFFERENTIAL (CANCER CENTER ONLY)
Abs Immature Granulocytes: 0.02 10*3/uL (ref 0.00–0.07)
Basophils Absolute: 0 10*3/uL (ref 0.0–0.1)
Basophils Relative: 0 %
Eosinophils Absolute: 0.1 10*3/uL (ref 0.0–0.5)
Eosinophils Relative: 1 %
HCT: 37.8 % (ref 36.0–46.0)
Hemoglobin: 12 g/dL (ref 12.0–15.0)
Immature Granulocytes: 0 %
Lymphocytes Relative: 46 %
Lymphs Abs: 2.5 10*3/uL (ref 0.7–4.0)
MCH: 31.3 pg (ref 26.0–34.0)
MCHC: 31.7 g/dL (ref 30.0–36.0)
MCV: 98.7 fL (ref 80.0–100.0)
Monocytes Absolute: 0.5 10*3/uL (ref 0.1–1.0)
Monocytes Relative: 9 %
Neutro Abs: 2.5 10*3/uL (ref 1.7–7.7)
Neutrophils Relative %: 44 %
Platelet Count: 266 10*3/uL (ref 150–400)
RBC: 3.83 MIL/uL — ABNORMAL LOW (ref 3.87–5.11)
RDW: 12.1 % (ref 11.5–15.5)
WBC Count: 5.6 10*3/uL (ref 4.0–10.5)
nRBC: 0 % (ref 0.0–0.2)

## 2022-01-10 LAB — CMP (CANCER CENTER ONLY)
ALT: 35 U/L (ref 0–44)
AST: 29 U/L (ref 15–41)
Albumin: 4.2 g/dL (ref 3.5–5.0)
Alkaline Phosphatase: 82 U/L (ref 38–126)
Anion gap: 8 (ref 5–15)
BUN: 20 mg/dL (ref 6–20)
CO2: 29 mmol/L (ref 22–32)
Calcium: 9.9 mg/dL (ref 8.9–10.3)
Chloride: 108 mmol/L (ref 98–111)
Creatinine: 1.06 mg/dL — ABNORMAL HIGH (ref 0.44–1.00)
GFR, Estimated: >60 mL/min (ref >60)
Glucose, Bld: 116 mg/dL — ABNORMAL HIGH (ref 70–99)
Potassium: 4.5 mmol/L (ref 3.5–5.1)
Sodium: 145 mmol/L (ref 135–145)
Total Bilirubin: 0.4 mg/dL (ref 0.3–1.2)
Total Protein: 7.1 g/dL (ref 6.5–8.1)

## 2022-01-10 LAB — LACTATE DEHYDROGENASE: LDH: 208 U/L — ABNORMAL HIGH (ref 98–192)

## 2022-01-10 MED ORDER — GABAPENTIN 300 MG PO CAPS
600.0000 mg | ORAL_CAPSULE | Freq: Every day | ORAL | 4 refills | Status: DC
  Filled 2022-01-10: qty 60, 30d supply, fill #0
  Filled 2022-02-07: qty 60, 30d supply, fill #1
  Filled 2022-03-10: qty 60, 30d supply, fill #2
  Filled 2022-04-16: qty 60, 30d supply, fill #0
  Filled 2022-05-20: qty 60, 30d supply, fill #1

## 2022-01-10 NOTE — Progress Notes (Signed)
Hematology and Oncology Follow Up Visit  MARCELIA PETERSEN [id number] [birth year] 61 y.o. 01/10/2022   Principle Diagnosis:  Stage IA (T1aN0M0) infiltrating ductal carcinoma of the right breast- ER+/PR+/HER2-  Current Therapy:   Status post bilateral mastectomies --04/26/2021 Arimidex 1 mg p.o. daily --start on 06/29/2021     Interim History:  Ms. Uhrich is back for follow-up.  We last saw her 3 months ago.  She is doing pretty well.  She does have some hot flashes from the Arimidex.  We talked about this.  She wants to try some gabapentin.  We will try her at 600 mg at bedtime.   She has not complained of any arthralgias or myalgias.  She is on vitamin D.  She is still doing physical therapy once a week for her right shoulder.  She is back to work.  She is quite busy.  She has had no problems with cough or shortness of breath.  There is no nausea or vomiting.  There is no change in bowel or bladder habits.  She has had no swelling in the right arm.  Overall, I would say performance status is probably ECOG 0.     Medications:  Current Outpatient Medications:    albuterol (VENTOLIN HFA) 108 (90 Base) MCG/ACT inhaler, Inhale 2 puffs into the lungs every 6 (six) hours as needed for wheezing., Disp: 18 g, Rfl: 6   ALPRAZolam (XANAX) 0.5 MG tablet, Take 1 tablet (0.5 mg total) by mouth 3 (three) times daily as needed., Disp: 60 tablet, Rfl: 3   anastrozole (ARIMIDEX) 1 MG tablet, Take 1 tablet (1 mg total) by mouth daily., Disp: 90 tablet, Rfl: 12   cetirizine (ZYRTEC) 10 MG tablet, Take 1 tablet (10 mg total) by mouth daily., Disp: 90 tablet, Rfl: 1   Cholecalciferol (VITAMIN D-3) 125 MCG (5000 UT) TABS, Take 5,000 Units by mouth every evening., Disp: , Rfl:    fluticasone (FLONASE) 50 MCG/ACT nasal spray, Place 2 sprays into both nostrils daily., Disp: 16 g, Rfl: 6   montelukast (SINGULAIR) 10 MG tablet, Take 1 tablet (10 mg total) by mouth daily., Disp: 90 tablet, Rfl: 1    pantoprazole (PROTONIX) 40 MG tablet, Take 1 tablet (40 mg total) by mouth 2 (two) times daily., Disp: 180 tablet, Rfl: 1   sertraline (ZOLOFT) 100 MG tablet, Take 1 tablet (100 mg total) by mouth daily., Disp: 90 tablet, Rfl: 1   zolpidem (AMBIEN) 10 MG tablet, Take 1 tablet (10 mg total) by mouth at bedtime as needed for sleep., Disp: 30 tablet, Rfl: 3  Allergies:  Allergies  Allergen Reactions   Amoxicillin Rash and Other (See Comments)    Has patient had a PCN reaction causing immediate rash, facial/tongue/throat swelling, SOB or lightheadedness with hypotension: No Has patient had a PCN reaction causing severe rash involving mucus membranes or skin necrosis: No Has patient had a PCN reaction that required treatment: #  #  #  YES  #  #  # - MD office Has patient had a PCN reaction occurring within the last 10 years: Yes If all of the above answers are "NO", then may proceed with Cephalosporin use.    Erythromycin Nausea Only   Hydrocodone-Acetaminophen Other (See Comments)    Hallucinations   Penicillin G Rash   Shellfish Allergy Itching and Other (See Comments)    REACTS TO SCALLOPS     Past Medical History, Surgical history, Social history, and Family History were reviewed and updated.  Review  of Systems: Review of Systems  Constitutional:  Positive for appetite change.  HENT:  Negative.    Eyes: Negative.   Respiratory: Negative.    Cardiovascular: Negative.   Gastrointestinal:  Positive for diarrhea and vomiting.  Endocrine: Negative.   Genitourinary: Negative.    Musculoskeletal: Negative.   Skin: Negative.   Neurological: Negative.   Hematological: Negative.   Psychiatric/Behavioral: Negative.      Physical Exam:  height is 5' 2" (1.575 m) and weight is 190 lb (86.2 kg). Her oral temperature is 98 F (36.7 C). Her blood pressure is 97/73 and her pulse is 78. Her respiration is 18 and oxygen saturation is 96%.   Wt Readings from Last 3 Encounters:  01/10/22 190  lb (86.2 kg)  12/06/21 186 lb (84.4 kg)  10/11/21 176 lb 8 oz (80.1 kg)    Physical Exam Vitals reviewed.  Constitutional:      Comments: Breast exam shows status post mastectomies.  She has implants.  Everything looks to be healing nicely.  There is no erythema.  There is no exudate.  The wounds are intact.  There is no bilateral axillary adenopathy.    HENT:     Head: Normocephalic and atraumatic.  Eyes:     Pupils: Pupils are equal, round, and reactive to light.  Cardiovascular:     Rate and Rhythm: Normal rate and regular rhythm.     Heart sounds: Normal heart sounds.  Pulmonary:     Effort: Pulmonary effort is normal.     Breath sounds: Normal breath sounds.  Abdominal:     General: Bowel sounds are normal.     Palpations: Abdomen is soft.     Comments: Abdominal exam is soft.  There is no distention.  There is some tenderness to palpation.  She has no fluid wave.  Bowel sounds are present.  There is no palpable liver or spleen tip.  Musculoskeletal:        General: No tenderness or deformity. Normal range of motion.     Cervical back: Normal range of motion.     Comments: Extremities shows the right shoulder with the arthroscopic scars.    Lymphadenopathy:     Cervical: No cervical adenopathy.  Skin:    General: Skin is warm and dry.     Findings: No erythema or rash.  Neurological:     Mental Status: She is alert and oriented to person, place, and time.  Psychiatric:        Behavior: Behavior normal.        Thought Content: Thought content normal.        Judgment: Judgment normal.      Lab Results  Component Value Date   WBC 5.6 01/10/2022   HGB 12.0 01/10/2022   HCT 37.8 01/10/2022   MCV 98.7 01/10/2022   PLT 266 01/10/2022     Chemistry      Component Value Date/Time   NA 138 10/11/2021 0830   K 4.7 10/11/2021 0830   CL 104 10/11/2021 0830   CO2 27 10/11/2021 0830   BUN 18 10/11/2021 0830   CREATININE 0.95 10/11/2021 0830   CREATININE 0.92  04/28/2017 1645      Component Value Date/Time   CALCIUM 10.2 10/11/2021 0830   ALKPHOS 70 10/11/2021 0830   AST 16 10/11/2021 0830   ALT 15 10/11/2021 0830   BILITOT 0.4 10/11/2021 0830      Impression and Plan: Ms. Ostermiller is a very charming 61 year old African-American  female.  She is postmenopausal.  She has a very early stage ductal carcinoma of the right breast.  Again, I do suspect that with surgery alone, her chance of cure is probably 90%.  We will continue the Arimidex.  Again, I told her that if she had any problems with hot flashes, we can certainly get her on gabapentin or low-dose Megace.  Her puppy is now 57 months old.  She is quite energetic.  We will now get her back in 3 months.     Volanda Napoleon, MD 7/28/20238:10 AM

## 2022-01-10 NOTE — Progress Notes (Signed)
Patient is doing well on AI. She has no current navigation needs. Will discontinue navigation at this time but can be available if needs arise.   Oncology Nurse Navigator Documentation     01/10/2022    8:30 AM  Oncology Nurse Navigator Flowsheets  Navigation Complete Date: 01/10/2022  Post Navigation: Continue to Follow Patient? No  Reason Not Navigating Patient: Patient On Maintenance Chemotherapy  Navigator Location [facility]  Navigator Encounter Type Appt/Treatment Plan Review  Patient Visit Type MedOnc  Treatment Phase Active Tx  Barriers/Navigation Needs No Barriers At This Time  Interventions None Required  Acuity Level 1-No Barriers  Support Groups/Services Friends and Family  Time Spent with Patient 15

## 2022-01-14 ENCOUNTER — Encounter (HOSPITAL_BASED_OUTPATIENT_CLINIC_OR_DEPARTMENT_OTHER): Payer: Self-pay | Admitting: Physical Therapy

## 2022-01-14 ENCOUNTER — Ambulatory Visit (HOSPITAL_BASED_OUTPATIENT_CLINIC_OR_DEPARTMENT_OTHER): Payer: 59 | Attending: Orthopaedic Surgery | Admitting: Physical Therapy

## 2022-01-14 DIAGNOSIS — M6281 Muscle weakness (generalized): Secondary | ICD-10-CM | POA: Diagnosis not present

## 2022-01-14 DIAGNOSIS — M25611 Stiffness of right shoulder, not elsewhere classified: Secondary | ICD-10-CM | POA: Insufficient documentation

## 2022-01-14 DIAGNOSIS — M25511 Pain in right shoulder: Secondary | ICD-10-CM | POA: Diagnosis not present

## 2022-01-14 DIAGNOSIS — R600 Localized edema: Secondary | ICD-10-CM | POA: Insufficient documentation

## 2022-01-14 NOTE — Therapy (Addendum)
OUTPATIENT PHYSICAL THERAPY SHOULDER TREATMENT   Patient Name: Katelyn Reyes MRN: [id number] DOB:1961/01/05, 61 y.o., female Today's Date: 01/14/2022   PT End of Session - 01/14/22 1438     Visit Number 22    Number of Visits 25    Date for PT Re-Evaluation 03/31/22    Authorization Type [employer]    PT Start Time 1435    PT Stop Time 1510    PT Time Calculation (min) 35 min    Activity Tolerance Patient tolerated treatment well    Behavior During Therapy [facility] for tasks assessed/performed                                Past Medical History:  Diagnosis Date   Allergic rhinitis    Anxiety    Arthritis    Asthma    related to seasonal allergies   Breast calcification, right 02/02/2013   Surgically excised 02/17/13 > path benign    Cancer ([place])    Depression    Family history of breast cancer 04/12/2021   Family history of ovarian cancer 04/12/2021   GERD (gastroesophageal reflux disease)    Hyperlipidemia    IUD 02/07/2010   removed   Rotator cuff tear    right   Past Surgical History:  Procedure Laterality Date   BREAST BIOPSY Right 02/17/2013   Procedure:  NEEDLE LOCALIZATION REMOVAL RIGHT BREAST CALCIFICATIONS;  Surgeon: Haywood Lasso, MD;  Location: [place];  Service: General;  Laterality: Right;   BREAST EXCISIONAL BIOPSY Right pt unsure   benign   BREAST RECONSTRUCTION WITH PLACEMENT OF TISSUE EXPANDER AND ALLODERM Bilateral 04/26/2021   Procedure: BREAST RECONSTRUCTION WITH PLACEMENT OF TISSUE EXPANDER AND ALLODERM;  Surgeon: Irene Limbo, MD;  Location: [place];  Service: Plastics;  Laterality: Bilateral;   BURCH PROCEDURE N/A 12/22/2013   Procedure: BURCH CYSTO URETHROPEXY, ANTERIOR AND POSTERIOR CULPORRHAPHY;  Surgeon: Ena Dawley, MD;  Location: [place] [facility];  Service: Gynecology;  Laterality: N/A;   CERVICAL DISC ARTHROPLASTY N/A 11/12/2017   Procedure: ARTIFICIAL DISC REPLACEMENT  CERVICAL SIX-SEVEN;  Surgeon: Ashok Pall, MD;  Location: [place];  Service: Neurosurgery;  Laterality: N/A;  ARTIFICIAL DISC REPLACEMENT CERVICAL 6- CERVICAL 7   CHOLECYSTECTOMY N/A 10/09/2017   Procedure: LAPAROSCOPIC CHOLECYSTECTOMY WITH INTRAOPERATIVE CHOLANGIOGRAM;  Surgeon: Judeth Horn, MD;  Location: [place];  Service: General;  Laterality: N/A;   KNEE ARTHROSCOPY WITH MEDIAL MENISECTOMY Right 05/01/2020   Procedure: KNEE ARTHROSCOPY WITH PARTIAL MEDIAL MENISECTOMY,   PATELLA  CHONDROPLASTY;  Surgeon: Paralee Cancel, MD;  Location: [facility];  Service: Orthopedics;  Laterality: Right;   LIPOSUCTION WITH LIPOFILLING Bilateral 07/23/2021   Procedure: LIPOFILLING FROM ABDOMEN TO BILATERAL CHEST;  Surgeon: Irene Limbo, MD;  Location: [place];  Service: Plastics;  Laterality: Bilateral;   MASTECTOMY W/ SENTINEL NODE BIOPSY Right 04/26/2021   Procedure: RIGHT MASTECTOMY WITH RIGHT SENTINEL LYMPH NODE BIOPSY;  Surgeon: [name], MD;  Location: [place];  Service: General;  Laterality: Right;   MOUTH SURGERY     NASAL SINUS SURGERY     REMOVAL OF BILATERAL TISSUE EXPANDERS WITH PLACEMENT OF BILATERAL BREAST IMPLANTS Bilateral 07/23/2021   Procedure: REMOVAL OF BILATERAL TISSUE EXPANDERS WITH PLACEMENT OF BILATERAL BREAST SILICONE IMPLANTS;  Surgeon: Irene Limbo, MD;  Location: [place];  Service: Plastics;  Laterality: Bilateral;   SHOULDER ARTHROSCOPY WITH ROTATOR CUFF  REPAIR AND OPEN BICEPS TENODESIS Right 08/29/2021   Procedure: RIGHT SHOULDER ARTHROSCOPY WITH ROTATOR CUFF REPAIR WITH PATCH AUGMENTATION AND  BICEPS TENODESIS;  Surgeon: Vanetta Mulders, MD;  Location: [place];  Service: Orthopedics;  Laterality: Right;   TOTAL MASTECTOMY Left 04/26/2021   Procedure: LEFT TOTAL MASTECTOMY;  Surgeon: [name], MD;  Location: [place];  Service: General;  Laterality: Left;    Patient Active Problem List   Diagnosis Date Noted   Nontraumatic complete tear of right rotator cuff    Biceps tendinitis of right upper extremity    Breast cancer, right ([place]) 04/26/2021   Family history of breast cancer 04/12/2021   Malignant neoplasm of upper outer quadrant of female breast ([place]) 04/12/2021   Family history of colon cancer 04/12/2021   Family history of ovarian cancer 24/58/0998   Metabolic syndrome 33/82/5053   Diverticular disease of colon 07/17/2020   Family history of malignant neoplasm of gastrointestinal tract 07/17/2020   Fatty liver 97/67/3419   Nonalcoholic steatohepatitis ([place]) 07/17/2020   Personal history of colonic polyps 07/17/2020   Acute medial meniscal tear, right, subsequent encounter 05/01/2020   ETD (Eustachian tube dysfunction), bilateral 12/27/2019   Obesity (BMI 30-39.9) 07/12/2018   Cervical spondylosis with radiculopathy 11/12/2017   Vitamin D deficiency 07/07/2017   Urinary incontinence in female 12/22/2013   Asthma, mild intermittent 03/22/2013   Vaginal itching 06/01/2012   Thyromegaly 03/18/2012   General medical examination 03/14/2011   CHICKENPOX, HX OF 02/15/2010   Hyperlipidemia 02/14/2009   DEPRESSION 02/14/2009   ALLERGIC RHINITIS 02/14/2009    PCP: Midge Minium, MD  REFERRING PROVIDER: Midge Minium, MD  REFERRING DIAG:  [phone] (ICD-10-CM) - Complete rotator cuff tear or rupture of right shoulder, not specified as traumatic  M67.813 (ICD-10-CM) - Biceps tendonosis of right shoulder    THERAPY DIAG:  Muscle weakness (generalized)  Stiffness of right shoulder, not elsewhere classified  Localized edema  Acute pain of right shoulder   ONSET DATE: 08/29/21 Surgery   PROCEDURE: 1.  Right rotator cuff arthroscopic repair with collagen patch augmentation 2.  Right biceps tenodesis 3.  Right shoulder extensive debridement 4.  Right shoulder acromioplasty with subacromial  decompression  SUBJECTIVE:                                                                                                                                                                                      SUBJECTIVE STATEMENT: Pt reports she had no issues after last session.  Pt states that she notices she is stronger with the R arm now. She is now able to pick up her dog with her  R arm.   PERTINENT HISTORY: Breast cancer, double mastectomy  PAIN:  Are you having pain? 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 Complete tear of the supraspinatus tendon with 4 cm of retraction. 2. Severe tendinosis of the infraspinatus tendon with an interstitial tear. 3. Mild tendinosis of the intra-articular portion of the long head of the biceps tendon.   UPPER EXTREMITY ROM:    AROM 5/11 PROM 5/11 6/27 AROM  Shoulder flexion 60 165 162 supine  Shoulder extension       Shoulder abduction 75 150 140  Shoulder adduction       Shoulder internal rotation To belly belly T12  BHB reach  Shoulder external rotation 50  55 T2 BHB reach   (Blank rows = not tested)   UPPER EXTREMITY MMT:  6/27 UE HHD      L     R        Shoulder flexion 36.1 lbs 11.4  Shoulder extension    Shoulder abduction 19.7 11.5  Shoulder adduction    Shoulder internal rotation 15.1 15.6  Shoulder external rotation 16.0 10.0     TODAY'S TREATMENT:   8/1  UBE retro and fwd warm up L2.5 2 min  -90/90 ER 2lb DB, seated with pillow under arm 2x10 -Seated cable row x10 25lbs, 35lbs 2x10 Cable face pull 15lbs 2x10 -cable tricep press down 15lbs  2x10 -2lb shoulder ABD 3x10 -2lb OHP 2x8 20x CW and CCW wall ball  -wall prtraction with pillowcase 2x10  7/18  Joint mobilization: Post and inf grade III R GHJ  -S/L ABD 3x10 1lb S/L ER 2lb 2x10 -S/L flexion 1lb 2x10 -standing OHP no weight 1 lb 2x10 -horizontal ABD YTB 2x10 - blue TB TB rowing 2x10 -YTB bilat ER 2x10 -table push up 3x10   6/27  UBE retro and fwd warm up  Joint mobilization: Post and inf grade III R GHJ STM R rhomboid/mid trap (active spasm)  6/20  UBE retro and fwd 76mn each  -S/L ABD 2x10 -S/L flexion 1lb 2x10 -standing OHP no weight 2x10 -horizontal ABD YTB 2x10 -GTB TB shoulder ext 3x10 -GTB standing ER 2x10 -wall push up 3x10   6/15  UBE retro and fwd 224m each  -S/L ABD 2x10 -S/L flexion 2x10 -standing OHP no weight 2x10 -GTB rowing 3x10 -blue TB shoulder ext 3x10 -prone T 3x10 -S/L 2lb ER 3x10 -wall push up 2x10 -wall ball CW and CCW 20x  Grade III R GHJ inf and post mob Manual stretch to end range   6/5  UBE retro and fwd 47m34meach  -S/L ABD 1lb 10x, 10x without weight -supine flexion 3x10 -Supine protraction 3x10 1lb -supine shoulder ABC 2x 1lb weight -prone row 2lb 3x10 -Prone ext 3x10 -RTB rowing 3x10 -prone T 3x10 -S/L 1lb ER 3x10 -standing IR RTB 3x10    Previous: OH pulley's 2 min ABD and Flexion  -S/L ABD 2x10 -S/L flexion 2x10 -Supine AROM protraction 3x10 -supine shoulder ABC 2x 1lb weight -prone row 1lb 3x10 -RTB rowing 3x10 -prone T 3x10 -S/L 1lb ER 3x10 -standing IR RTB 3x10 -supine YTB horizontal ABD 2x10     PATIENT  EDUCATION: Education details: exam findings, exercise focus, deficits/gains, exercise progression, muscle firing,  HEP modifications   Person educated: Patient Education method: Explanation, Demonstration, Tactile cues, Verbal cues, and Handouts Education comprehension: verbalized understanding, returned demonstration, verbal cues required, and tactile cues  required   HOME EXERCISE PROGRAM: Access Code: [email or web address] URL: [email or web address] Date: 12/31/2021 Prepared by: Daleen Bo  Exercises - Standing Shoulder Scaption  - 1 x daily - 2 x weekly - 3 sets - 8 reps - Sidelying Shoulder Abduction Palm Forward  - 1 x daily - 2 x weekly - 3 sets - 8 reps - Shoulder Overhead Press in Flexion with Dumbbells  - 1 x daily - 2 x weekly - 3 sets - 8 reps - Shoulder External Rotation with Anchored Resistance  - 1 x daily - 2 x weekly - 3 sets - 8 reps - Standing Shoulder Row with Anchored Resistance  - 1 x daily - 2 x weekly - 3 sets - 10 reps   ASSESSMENT:  CLINICAL IMPRESSION: Pt with improvement in shoulder strength at today's session and able to progress. Pt HEP updated at this time to continue with strengthening. Pt to see MD on Monday for 4 month follow up. Pt progressing very well with therapy at this time. Pt is largely strength limited. ROM is very good. Pt would benefit from continued skilled therapy in order to reach goals and maximize functional R UE strength and ROM for full return to PLOF.     OBJECTIVE IMPAIRMENTS decreased ROM, decreased strength, hypomobility, increased fascial restrictions, increased muscle spasms, impaired flexibility, impaired UE functional use, improper body mechanics, postural dysfunction, and pain.   ACTIVITY LIMITATIONS cleaning, community activity, driving, meal prep, occupation, laundry, medication management, yard work, shopping, and exercise .   PERSONAL FACTORS Age, Behavior pattern, Fitness, Time since onset of injury/illness/exacerbation, and 1-2 comorbidities:    are also affecting patient's functional outcome.    REHAB POTENTIAL: Good  CLINICAL DECISION MAKING: Stable/uncomplicated  EVALUATION COMPLEXITY: Low   GOALS:   SHORT TERM GOALS: Target date: 02/25/2022  Pt will become independent with HEP in order to demonstrate synthesis of PT education.   Goal status: MET  2.  Pt  will be able to demonstrate PROM to protocol limits in order to demonstrate functional improvement in R UE function for progression to next phase of protocol.   Goal status: MET  3.  Pt will be able to demonstrate AAROM without pain in order to demonstrate functional improvement in UE function for self-care and house hold duties.   Goal status: MET  LONG TERM GOALS: Target date: 04/08/2022  Pt  will become independent with final HEP in order to demonstrate synthesis of PT education.   Goal status: ongoing  2.  Pt will be able to demonstrate full A/PROM in order to demonstrate functional improvement in UE function for self-care and house hold duties.   Goal status: partially met with PROM  3.  Pt will be able to demonstrate ability to reach and set 1lb object onto shelf in order to demonstrate functional improvement in UE function for self-care and house hold duties.   Goal status: ongoing  4.  Pt will be able to reach [facility] and carry/hold >5 lbs in order to demonstrate functional improvement in R UE strength for return to PLOF and exercise.   Goal status: ongoing   PLAN: PT FREQUENCY: 1-2x/week  PT DURATION: 12 weeks   PLANNED INTERVENTIONS: Therapeutic exercises, Therapeutic activity, Neuromuscular re-education, Balance training,  Gait training, Patient/Family education, Joint manipulation, Joint mobilization, Aquatic Therapy, Dry Needling, Electrical stimulation, Spinal manipulation, Spinal mobilization, Cryotherapy, Moist heat, scar mobilization, Taping, Vasopneumatic device, Traction, Ultrasound, Ionotophoresis 21m/ml Dexamethasone, and Manual therapy  PLAN FOR NEXT SESSION: Progress strengthening   ADaleen Bo PT 01/14/2022, 3:25 PM

## 2022-01-17 ENCOUNTER — Encounter: Payer: Self-pay | Admitting: Family Medicine

## 2022-01-17 ENCOUNTER — Other Ambulatory Visit: Payer: Self-pay

## 2022-01-17 ENCOUNTER — Ambulatory Visit: Payer: 59 | Admitting: Family Medicine

## 2022-01-17 ENCOUNTER — Telehealth: Payer: Self-pay | Admitting: Family Medicine

## 2022-01-17 VITALS — BP 108/82 | HR 78 | Temp 97.6°F | Resp 18 | Ht 62.0 in | Wt 190.4 lb

## 2022-01-17 DIAGNOSIS — E669 Obesity, unspecified: Secondary | ICD-10-CM | POA: Diagnosis not present

## 2022-01-17 DIAGNOSIS — R7301 Impaired fasting glucose: Secondary | ICD-10-CM | POA: Diagnosis not present

## 2022-01-17 LAB — HEMOGLOBIN A1C: Hgb A1c MFr Bld: 5.6 % (ref 4.6–6.5)

## 2022-01-17 NOTE — Patient Instructions (Addendum)
We'll notify you of your lab results and make any changes if needed Once we know what the A1C is we can determine our next steps for weight loss Continue to work on low carb diet and regular exercise- you can do it! Call with any questions or concerns Hang in there!!!

## 2022-01-17 NOTE — Progress Notes (Signed)
   Subjective:    Patient ID: Katelyn Reyes, female    DOB: 04-Nov-1960, 61 y.o.   MRN: [id number]  HPI Obesity- pt was started on Contrave at last visit.  Pt reports Contrave made her feel 'terrible'.  Heart was racing, palpitations.  Pt reports once she stopped the medication she feels good.  Pt is hoping to have shoulder restrictions lifted next week.  Has a new puppy so is walking regularly.  Continues to do low carb diet, watching fat intake.  Fasting blood sugar was 116 last week.  Pt does not have a hx of elevated sugars.  Both parents have diabetes.  No CP, SOB, HAs, visual changes.  +increased water intake, some increased urination   Review of Systems For ROS see HPI     Objective:   Physical Exam Vitals reviewed.  Constitutional:      General: She is not in acute distress.    Appearance: Normal appearance. She is obese. She is not ill-appearing.  Cardiovascular:     Rate and Rhythm: Normal rate and regular rhythm.  Pulmonary:     Effort: Pulmonary effort is normal. No respiratory distress.     Breath sounds: No wheezing.  Skin:    General: Skin is warm and dry.  Neurological:     General: No focal deficit present.     Mental Status: She is alert and oriented to person, place, and time.  Psychiatric:        Mood and Affect: Mood normal.        Behavior: Behavior normal.        Thought Content: Thought content normal.           Assessment & Plan:

## 2022-01-17 NOTE — Assessment & Plan Note (Signed)
Ongoing issue.  Pt did not tolerate the Contrave due to palpitations and heightened anxiety.  She is worried she may have diabetes b/c her fasting sugar was 116 and both of her parents have diabetes.  Check A1C and determine if other weight loss medications are available to her.  Pt expressed understanding and is in agreement w/ plan.

## 2022-01-17 NOTE — Telephone Encounter (Signed)
Caller name: Kahealani (pt)  On DPR? :yes/no: Yes  Call back number: [phone]  Provider they see: Dr. Birdie Riddle  Reason for call: Pt would like to go on medication that Dr. Birdie Riddle recommended.

## 2022-01-20 ENCOUNTER — Encounter (HOSPITAL_BASED_OUTPATIENT_CLINIC_OR_DEPARTMENT_OTHER): Payer: 59 | Admitting: Orthopaedic Surgery

## 2022-01-20 ENCOUNTER — Ambulatory Visit (INDEPENDENT_AMBULATORY_CARE_PROVIDER_SITE_OTHER): Payer: 59 | Admitting: Orthopaedic Surgery

## 2022-01-20 DIAGNOSIS — M75121 Complete rotator cuff tear or rupture of right shoulder, not specified as traumatic: Secondary | ICD-10-CM

## 2022-01-20 NOTE — Progress Notes (Signed)
Post Operative Evaluation    Procedure/Date of Surgery: Right shoulder rotator cuff repair with biceps tenodesis August 29, 2021  Interval History:   Presents today for follow-up at 5 months status post right shoulder rotator cuff repair with biceps tenodesis.  She continues to make significant improvement.  She is currently continue to work with physical therapy for strengthening of the right shoulder.  She just recently as taken in position and case management at [facility] long.  She is here today for further assessment.  PMH/PSH/Family History/Social History/Meds/Allergies:    Past Medical History:  Diagnosis Date   Allergic rhinitis    Anxiety    Arthritis    Asthma    related to seasonal allergies   Breast calcification, right 02/02/2013   Surgically excised 02/17/13 > path benign    Cancer ([place])    Depression    Family history of breast cancer 04/12/2021   Family history of ovarian cancer 04/12/2021   GERD (gastroesophageal reflux disease)    Hyperlipidemia    IUD 02/07/2010   removed   Rotator cuff tear    right   Past Surgical History:  Procedure Laterality Date   BREAST BIOPSY Right 02/17/2013   Procedure:  NEEDLE LOCALIZATION REMOVAL RIGHT BREAST CALCIFICATIONS;  Surgeon: Haywood Lasso, MD;  Location: [place];  Service: General;  Laterality: Right;   BREAST EXCISIONAL BIOPSY Right pt unsure   benign   BREAST RECONSTRUCTION WITH PLACEMENT OF TISSUE EXPANDER AND ALLODERM Bilateral 04/26/2021   Procedure: BREAST RECONSTRUCTION WITH PLACEMENT OF TISSUE EXPANDER AND ALLODERM;  Surgeon: Irene Limbo, MD;  Location: [place];  Service: Plastics;  Laterality: Bilateral;   BURCH PROCEDURE N/A 12/22/2013   Procedure: BURCH CYSTO URETHROPEXY, ANTERIOR AND POSTERIOR CULPORRHAPHY;  Surgeon: Ena Dawley, MD;  Location: [place] [facility];  Service: Gynecology;  Laterality: N/A;   CERVICAL DISC ARTHROPLASTY N/A  11/12/2017   Procedure: ARTIFICIAL DISC REPLACEMENT CERVICAL SIX-SEVEN;  Surgeon: Ashok Pall, MD;  Location: [place];  Service: Neurosurgery;  Laterality: N/A;  ARTIFICIAL DISC REPLACEMENT CERVICAL 6- CERVICAL 7   CHOLECYSTECTOMY N/A 10/09/2017   Procedure: LAPAROSCOPIC CHOLECYSTECTOMY WITH INTRAOPERATIVE CHOLANGIOGRAM;  Surgeon: Judeth Horn, MD;  Location: [place];  Service: General;  Laterality: N/A;   KNEE ARTHROSCOPY WITH MEDIAL MENISECTOMY Right 05/01/2020   Procedure: KNEE ARTHROSCOPY WITH PARTIAL MEDIAL MENISECTOMY,   PATELLA  CHONDROPLASTY;  Surgeon: Paralee Cancel, MD;  Location: [facility];  Service: Orthopedics;  Laterality: Right;   LIPOSUCTION WITH LIPOFILLING Bilateral 07/23/2021   Procedure: LIPOFILLING FROM ABDOMEN TO BILATERAL CHEST;  Surgeon: Irene Limbo, MD;  Location: [place];  Service: Plastics;  Laterality: Bilateral;   MASTECTOMY W/ SENTINEL NODE BIOPSY Right 04/26/2021   Procedure: RIGHT MASTECTOMY WITH RIGHT SENTINEL LYMPH NODE BIOPSY;  Surgeon: [name], MD;  Location: [place];  Service: General;  Laterality: Right;   MOUTH SURGERY     NASAL SINUS SURGERY     REMOVAL OF BILATERAL TISSUE EXPANDERS WITH PLACEMENT OF BILATERAL BREAST IMPLANTS Bilateral 07/23/2021   Procedure: REMOVAL OF BILATERAL TISSUE EXPANDERS WITH PLACEMENT OF BILATERAL BREAST SILICONE IMPLANTS;  Surgeon: Irene Limbo, MD;  Location: [place];  Service: Plastics;  Laterality: Bilateral;   SHOULDER ARTHROSCOPY WITH ROTATOR CUFF REPAIR AND OPEN BICEPS TENODESIS Right 08/29/2021  Procedure: RIGHT SHOULDER ARTHROSCOPY WITH ROTATOR CUFF REPAIR WITH PATCH AUGMENTATION AND  BICEPS TENODESIS;  Surgeon: Vanetta Mulders, MD;  Location: [place];  Service: Orthopedics;  Laterality: Right;   TOTAL MASTECTOMY Left 04/26/2021   Procedure: LEFT TOTAL MASTECTOMY;  Surgeon: [name], MD;  Location: [place];  Service: General;  Laterality: Left;   Social History   Socioeconomic History   Marital status: Single    Spouse name: Not on file   Number of children: Not on file   Years of education: Not on file   Highest education level: Not on file  Occupational History   Not on file  Tobacco Use   Smoking status: Never   Smokeless tobacco: Never  Vaping Use   Vaping Use: Never used  Substance and Sexual Activity   Alcohol use: Yes    Comment: social   Drug use: No   Sexual activity: Yes    Birth control/protection: Post-menopausal  Other Topics Concern   Not on file  Social History Narrative   Not on file   Social Determinants of Health   Financial Resource Strain: Not on file  Food Insecurity: Not on file  Transportation Needs: Not on file  Physical Activity: Not on file  Stress: Not on file  Social Connections: Not on file   Family History  Problem Relation Age of Onset   Fibrocystic breast disease Mother    Hypertension Father    Diabetes Father    Breast cancer Maternal Aunt        dx 73s   Colon cancer Paternal Aunt        x2 pat aunts, dx after 14   Stroke Maternal Grandmother    Cancer Other        ovarian/GYN; MGF's mother   Colon cancer Other        MGM's father; dx after 35   Coronary artery disease Neg Hx    Allergies  Allergen Reactions   Amoxicillin Rash and Other (See Comments)    Has patient had a PCN reaction causing immediate rash, facial/tongue/throat swelling, SOB or lightheadedness with hypotension: No Has patient had a PCN reaction causing severe rash involving mucus membranes or skin necrosis: No Has patient had a PCN reaction that required treatment: #  #  #  YES  #  #  # - MD office Has patient had a PCN reaction occurring within the last 10 years: Yes If all of the above answers are "NO", then may proceed with Cephalosporin use.    Erythromycin Nausea Only   Hydrocodone-Acetaminophen Other (See Comments)    Hallucinations    Penicillin G Rash   Shellfish Allergy Itching and Other (See Comments)    REACTS TO SCALLOPS    Current Outpatient Medications  Medication Sig Dispense Refill   albuterol (VENTOLIN HFA) 108 (90 Base) MCG/ACT inhaler Inhale 2 puffs into the lungs every 6 (six) hours as needed for wheezing. 18 g 6   ALPRAZolam (XANAX) 0.5 MG tablet Take 1 tablet (0.5 mg total) by mouth 3 (three) times daily as needed. 60 tablet 3   anastrozole (ARIMIDEX) 1 MG tablet Take 1 tablet (1 mg total) by mouth daily. 90 tablet 12   cetirizine (ZYRTEC) 10 MG tablet Take 1 tablet (10 mg total) by mouth daily. 90 tablet 1   Cholecalciferol (VITAMIN D-3) 125 MCG (5000 UT) TABS Take 5,000 Units by mouth every evening.     fluticasone (FLONASE) 50 MCG/ACT nasal  spray Place 2 sprays into both nostrils daily. 16 g 6   gabapentin (NEURONTIN) 300 MG capsule Take 2 capsules (600 mg total) by mouth at bedtime. 60 capsule 4   montelukast (SINGULAIR) 10 MG tablet Take 1 tablet (10 mg total) by mouth daily. 90 tablet 1   pantoprazole (PROTONIX) 40 MG tablet Take 1 tablet (40 mg total) by mouth 2 (two) times daily. 180 tablet 1   sertraline (ZOLOFT) 100 MG tablet Take 1 tablet (100 mg total) by mouth daily. 90 tablet 1   zolpidem (AMBIEN) 10 MG tablet Take 1 tablet (10 mg total) by mouth at bedtime as needed for sleep. 30 tablet 3   No current facility-administered medications for this visit.   No results found.  Review of Systems:   A ROS was performed including pertinent positives and negatives as documented in the HPI.   Musculoskeletal Exam:    There were no vitals taken for this visit.  Skin incisions are well-healed.  Active forward elevation on both sides is to 170 and equal .  External rotation at the side is to 60 degrees without difficulty she able flex and extend at the right elbow and this is equal to the contralateral side.  Internal rotation is to T12 bilaterally.  Strength is near equal to the contralateral side.   She able to fire EPL as well as wrist extensors 2+ radial pulse  Imaging:    None  I personally reviewed and interpreted the radiographs.   Assessment:   61 year old female who is 5 months status post right shoulder rotator cuff repair and biceps tenodesis.  Overall she is doing extremely well.  At this time she will have a weight restriction of no lifting greater than 10 pounds and will continue to work through physical therapy.  I will plan to see her back in 3 months for final check Plan :    -Return to clinic in 12 weeks     I personally saw and evaluated the patient, and participated in the management and treatment plan.  Vanetta Mulders, MD Attending Physician, Orthopedic Surgery  This document was dictated using Dragon voice recognition software. A reasonable attempt at proof reading has been made to minimize errors.

## 2022-01-23 ENCOUNTER — Telehealth: Payer: Self-pay

## 2022-01-23 ENCOUNTER — Other Ambulatory Visit (HOSPITAL_COMMUNITY): Payer: Self-pay

## 2022-01-23 MED ORDER — SAXENDA 18 MG/3ML ~~LOC~~ SOPN
PEN_INJECTOR | SUBCUTANEOUS | 0 refills | Status: DC
  Filled 2022-01-23: qty 15, 44d supply, fill #0
  Filled 2022-01-23: qty 21, 56d supply, fill #0

## 2022-01-23 MED ORDER — INSULIN PEN NEEDLE 32G X 4 MM MISC
3 refills | Status: AC
Start: 2022-01-23 — End: ?
  Filled 2022-01-23: qty 100, 90d supply, fill #0
  Filled 2022-05-09: qty 100, 90d supply, fill #1

## 2022-01-23 NOTE — Telephone Encounter (Signed)
Prescription for needles sent to pharmacy

## 2022-01-23 NOTE — Telephone Encounter (Signed)
Pt would like to start saxenda

## 2022-01-23 NOTE — Telephone Encounter (Signed)
Advised pt that the [place] was sent in and may need a PA . She expressed verbal understanding

## 2022-01-23 NOTE — Telephone Encounter (Signed)
Caller name: Sherisse (pt)  On DPR? :yes/no: Yes  Call back number: [phone]  Provider they see: Dr. Birdie Riddle  Reason for call: Pt calling again to confirm that she would like to start medication that Dr. Birdie Riddle rx'd.  Pt would like a call when meds are called in to pharmacy.

## 2022-01-23 NOTE — Telephone Encounter (Signed)
Informed pt that the Rx for Saxenda has been approve form 01/23/22 till 05/15/22. Pt is aware and advised to call the pharmacy to fill

## 2022-01-23 NOTE — Telephone Encounter (Signed)
Prescription sent to pharmacy.  A PA is likely needed so pt will need to be patient while we navigate the insurance system

## 2022-01-24 ENCOUNTER — Encounter (HOSPITAL_BASED_OUTPATIENT_CLINIC_OR_DEPARTMENT_OTHER): Payer: 59 | Admitting: Orthopaedic Surgery

## 2022-01-28 ENCOUNTER — Ambulatory Visit (HOSPITAL_BASED_OUTPATIENT_CLINIC_OR_DEPARTMENT_OTHER): Payer: 59 | Admitting: Physical Therapy

## 2022-01-28 ENCOUNTER — Encounter (HOSPITAL_BASED_OUTPATIENT_CLINIC_OR_DEPARTMENT_OTHER): Payer: Self-pay | Admitting: Physical Therapy

## 2022-01-28 DIAGNOSIS — M6281 Muscle weakness (generalized): Secondary | ICD-10-CM | POA: Diagnosis not present

## 2022-01-28 DIAGNOSIS — R600 Localized edema: Secondary | ICD-10-CM

## 2022-01-28 DIAGNOSIS — M25611 Stiffness of right shoulder, not elsewhere classified: Secondary | ICD-10-CM | POA: Diagnosis not present

## 2022-01-28 DIAGNOSIS — M25511 Pain in right shoulder: Secondary | ICD-10-CM | POA: Diagnosis not present

## 2022-01-28 NOTE — Therapy (Signed)
OUTPATIENT PHYSICAL THERAPY SHOULDER TREATMENT   Patient Name: Katelyn Reyes MRN: [id number] DOB:1960/10/02, 61 y.o., female Today's Date: 01/28/2022   PT End of Session - 01/28/22 0808     Visit Number 23    Number of Visits 25    Date for PT Re-Evaluation 03/31/22    Authorization Type [employer]    PT Start Time 0805    PT Stop Time 0830    PT Time Calculation (min) 25 min    Activity Tolerance Patient tolerated treatment well    Behavior During Therapy [facility] for tasks assessed/performed                                 Past Medical History:  Diagnosis Date   Allergic rhinitis    Anxiety    Arthritis    Asthma    related to seasonal allergies   Breast calcification, right 02/02/2013   Surgically excised 02/17/13 > path benign    Cancer ([place])    Depression    Family history of breast cancer 04/12/2021   Family history of ovarian cancer 04/12/2021   GERD (gastroesophageal reflux disease)    Hyperlipidemia    IUD 02/07/2010   removed   Rotator cuff tear    right   Past Surgical History:  Procedure Laterality Date   BREAST BIOPSY Right 02/17/2013   Procedure:  NEEDLE LOCALIZATION REMOVAL RIGHT BREAST CALCIFICATIONS;  Surgeon: Haywood Lasso, MD;  Location: [place];  Service: General;  Laterality: Right;   BREAST EXCISIONAL BIOPSY Right pt unsure   benign   BREAST RECONSTRUCTION WITH PLACEMENT OF TISSUE EXPANDER AND ALLODERM Bilateral 04/26/2021   Procedure: BREAST RECONSTRUCTION WITH PLACEMENT OF TISSUE EXPANDER AND ALLODERM;  Surgeon: Irene Limbo, MD;  Location: [place];  Service: Plastics;  Laterality: Bilateral;   BURCH PROCEDURE N/A 12/22/2013   Procedure: BURCH CYSTO URETHROPEXY, ANTERIOR AND POSTERIOR CULPORRHAPHY;  Surgeon: Ena Dawley, MD;  Location: [facility];  Service: Gynecology;  Laterality: N/A;   CERVICAL DISC ARTHROPLASTY N/A 11/12/2017   Procedure: ARTIFICIAL DISC REPLACEMENT  CERVICAL SIX-SEVEN;  Surgeon: Ashok Pall, MD;  Location: [place];  Service: Neurosurgery;  Laterality: N/A;  ARTIFICIAL DISC REPLACEMENT CERVICAL 6- CERVICAL 7   CHOLECYSTECTOMY N/A 10/09/2017   Procedure: LAPAROSCOPIC CHOLECYSTECTOMY WITH INTRAOPERATIVE CHOLANGIOGRAM;  Surgeon: Judeth Horn, MD;  Location: [place];  Service: General;  Laterality: N/A;   KNEE ARTHROSCOPY WITH MEDIAL MENISECTOMY Right 05/01/2020   Procedure: KNEE ARTHROSCOPY WITH PARTIAL MEDIAL MENISECTOMY,   PATELLA  CHONDROPLASTY;  Surgeon: Paralee Cancel, MD;  Location: [facility];  Service: Orthopedics;  Laterality: Right;   LIPOSUCTION WITH LIPOFILLING Bilateral 07/23/2021   Procedure: LIPOFILLING FROM ABDOMEN TO BILATERAL CHEST;  Surgeon: Irene Limbo, MD;  Location: [place];  Service: Plastics;  Laterality: Bilateral;   MASTECTOMY W/ SENTINEL NODE BIOPSY Right 04/26/2021   Procedure: RIGHT MASTECTOMY WITH RIGHT SENTINEL LYMPH NODE BIOPSY;  Surgeon: [name], MD;  Location: [place];  Service: General;  Laterality: Right;   MOUTH SURGERY     NASAL SINUS SURGERY     REMOVAL OF BILATERAL TISSUE EXPANDERS WITH PLACEMENT OF BILATERAL BREAST IMPLANTS Bilateral 07/23/2021   Procedure: REMOVAL OF BILATERAL TISSUE EXPANDERS WITH PLACEMENT OF BILATERAL BREAST SILICONE IMPLANTS;  Surgeon: Irene Limbo, MD;  Location: [place];  Service: Plastics;  Laterality: Bilateral;   SHOULDER ARTHROSCOPY WITH ROTATOR  CUFF REPAIR AND OPEN BICEPS TENODESIS Right 08/29/2021   Procedure: RIGHT SHOULDER ARTHROSCOPY WITH ROTATOR CUFF REPAIR WITH PATCH AUGMENTATION AND  BICEPS TENODESIS;  Surgeon: Vanetta Mulders, MD;  Location: [place];  Service: Orthopedics;  Laterality: Right;   TOTAL MASTECTOMY Left 04/26/2021   Procedure: LEFT TOTAL MASTECTOMY;  Surgeon: [name], MD;  Location: [place];  Service: General;  Laterality: Left;    Patient Active Problem List   Diagnosis Date Noted   Nontraumatic complete tear of right rotator cuff    Biceps tendinitis of right upper extremity    Breast cancer, right ([place]) 04/26/2021   Family history of breast cancer 04/12/2021   Malignant neoplasm of upper outer quadrant of female breast ([place]) 04/12/2021   Family history of colon cancer 04/12/2021   Family history of ovarian cancer 47/82/9562   Metabolic syndrome 13/01/6577   Diverticular disease of colon 07/17/2020   Family history of malignant neoplasm of gastrointestinal tract 07/17/2020   Fatty liver 46/96/2952   Nonalcoholic steatohepatitis ([facility]) 07/17/2020   Personal history of colonic polyps 07/17/2020   Acute medial meniscal tear, right, subsequent encounter 05/01/2020   ETD (Eustachian tube dysfunction), bilateral 12/27/2019   Obesity (BMI 30-39.9) 07/12/2018   Cervical spondylosis with radiculopathy 11/12/2017   Vitamin D deficiency 07/07/2017   Urinary incontinence in female 12/22/2013   Asthma, mild intermittent 03/22/2013   Vaginal itching 06/01/2012   Thyromegaly 03/18/2012   General medical examination 03/14/2011   CH[place]EN[place], HX OF 02/15/2010   Hyperlipidemia 02/14/2009   DEPRESSION 02/14/2009   ALLERGIC RHINITIS 02/14/2009    PCP: Midge Minium, MD  REFERRING PROVIDER: Midge Minium, MD  REFERRING DIAG:  [phone] (ICD-10-CM) - Complete rotator cuff tear or rupture of right shoulder, not specified as traumatic  M67.813 (ICD-10-CM) - Biceps tendonosis of right shoulder    THERAPY DIAG:  Muscle weakness (generalized)  Stiffness of right shoulder, not elsewhere classified  Localized edema   ONSET DATE: 08/29/21 Surgery   PROCEDURE: 1.  Right rotator cuff arthroscopic repair with collagen patch augmentation 2.  Right biceps tenodesis 3.  Right shoulder extensive debridement 4.  Right shoulder acromioplasty with subacromial decompression  SUBJECTIVE:                                                                                                                                                                                       SUBJECTIVE STATEMENT: Pt reports she had no issues after last session.  Pt saw MD and has 10lb lifting restriction at this time for work.   PERTINENT HISTORY: Breast cancer, double mastectomy  PAIN:  Are you having pain? 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 Complete tear of the supraspinatus tendon with 4 cm of retraction. 2. Severe tendinosis of the infraspinatus tendon with an interstitial tear. 3. Mild tendinosis of the intra-articular portion of the long head of the biceps tendon.   UPPER EXTREMITY ROM:    AROM 5/11 PROM 5/11 6/27 AROM  Shoulder flexion 60 165 162 supine  Shoulder extension       Shoulder abduction 75 150 140  Shoulder adduction       Shoulder internal rotation To belly belly T12  BHB reach  Shoulder external rotation 50  55 T2 BHB reach   (Blank rows = not tested)   UPPER EXTREMITY MMT:  6/27 UE HHD      L     R        Shoulder flexion 36.1 lbs 11.4  Shoulder extension    Shoulder abduction 19.7 11.5  Shoulder adduction    Shoulder internal rotation 15.1 15.6  Shoulder external rotation 16.0 10.0     TODAY'S TREATMENT:   8/15  UBE retro and fwd warm up L2.5 2 min  -90/90 ER 2lb DB, seated with pillow under arm 2x10 -Seated cable row 35lbs 3x10 Cable face pull 15lbs 2x10 -cable tricep press down 15lbs 2x10 -3lb shoulder ABD 2x10 -counter push up 2x10 -horizontal ABD 2x10  8/1  UBE retro and fwd warm up L2.5 2  min  -90/90 ER 2lb DB, seated with pillow under arm 2x10 -Seated cable row x10 25lbs, 35lbs 2x10 Cable face pull 15lbs 2x10 -cable tricep press down 15lbs 2x10 -2lb shoulder ABD 3x10 -2lb OHP 2x8 20x CW and CCW wall ball  -wall prtraction with pillowcase 2x10  7/18  Joint mobilization: Post and inf grade III R GHJ  -S/L ABD 3x10 1lb S/L ER 2lb 2x10 -S/L flexion 1lb 2x10 -standing OHP no weight 1 lb 2x10 -horizontal ABD YTB 2x10 - blue TB TB rowing 2x10 -YTB bilat ER 2x10 -table push up 3x10   6/27  UBE retro and fwd warm up  Joint mobilization: Post and inf grade III R GHJ STM R rhomboid/mid trap (active spasm)  6/20  UBE retro and fwd 776mn each  -S/L ABD 2x10 -S/L flexion 1lb 2x10 -standing OHP no weight 2x10 -horizontal ABD YTB 2x10 -GTB TB shoulder ext 3x10 -GTB standing ER 2x10 -wall push up 3x10   6/15  UBE retro and fwd 2101m each  -S/L ABD 2x10 -S/L flexion 2x10 -standing OHP no weight 2x10 -GTB rowing 3x10 -blue TB shoulder ext 3x10 -prone T 3x10 -S/L 2lb ER 3x10 -wall push up 2x10 -wall ball CW and CCW 20x  Grade III R GHJ inf and post mob Manual stretch to end range   6/5  UBE retro and fwd 76m30meach  -S/L ABD 1lb 10x, 10x without weight -supine flexion 3x10 -Supine protraction 3x10 1lb -supine shoulder ABC 2x 1lb weight -prone row 2lb 3x10 -Prone ext 3x10 -RTB rowing 3x10 -prone T 3x10 -S/L 1lb ER 3x10 -standing IR RTB 3x10    Previous: [place] pulley's 2 min ABD and Flexion  -S/L ABD 2x10 -S/L flexion 2x10 -Supine AROM protraction 3x10 -supine shoulder  ABC 2x 1lb weight -prone row 1lb 3x10 -RTB rowing 3x10 -prone T 3x10 -S/L 1lb ER 3x10 -standing IR RTB 3x10 -supine YTB horizontal ABD 2x10     PATIENT EDUCATION: Education details: exam findings, exercise focus, deficits/gains, exercise progression, muscle firing,  HEP modifications   Person educated: Patient Education method: Explanation, Demonstration,  Tactile cues, Verbal cues, and Handouts Education comprehension: verbalized understanding, returned demonstration, verbal cues required, and tactile cues required   HOME EXERCISE PROGRAM: Access Code: [email or web address] URL: [email or web address] Date: 12/31/2021 Prepared by: Daleen Bo  Exercises - Standing Shoulder Scaption  - 1 x daily - 2 x weekly - 3 sets - 8 reps - Sidelying Shoulder Abduction Palm Forward  - 1 x daily - 2 x weekly - 3 sets - 8 reps - Shoulder Overhead Press in Flexion with Dumbbells  - 1 x daily - 2 x weekly - 3 sets - 8 reps - Shoulder External Rotation with Anchored Resistance  - 1 x daily - 2 x weekly - 3 sets - 8 reps - Standing Shoulder Row with Anchored Resistance  - 1 x daily - 2 x weekly - 3 sets - 10 reps   ASSESSMENT:  CLINICAL IMPRESSION:  Pt able to progress with strengthening for HEP at today's session without issue. Pt does continue to have strength deficits once above shoulder level but able to progress with intensity of loading. HEP updated at this time. Pt to continue at reduced freq of visit. However, advised to focus on above 90 deg strengthening for ABD and flexion. Pt HEP frequency increased to 2-3x/week in order for higher dosage of loading. Pt still doing well outside of [place] strength deficit. No pain noted. Pt would benefit from continued skilled therapy in order to reach goals and maximize functional R UE strength and ROM for full return to PLOF.     OBJECTIVE IMPAIRMENTS decreased ROM, decreased strength, hypomobility, increased fascial restrictions, increased muscle spasms, impaired flexibility, impaired UE functional use, improper body mechanics, postural dysfunction, and pain.   ACTIVITY LIMITATIONS cleaning, community activity, driving, meal prep, occupation, laundry, medication management, yard work, shopping, and exercise .   PERSONAL FACTORS Age, Behavior pattern, Fitness, Time since onset of injury/illness/exacerbation, and 1-2  comorbidities:    are also affecting patient's functional outcome.    REHAB POTENTIAL: Good  CLINICAL DECISION MAKING: Stable/uncomplicated  EVALUATION COMPLEXITY: Low   GOALS:   SHORT TERM GOALS: Target date: 03/11/2022  Pt will become independent with HEP in order to demonstrate synthesis of PT education.   Goal status: MET  2.  Pt will be able to demonstrate PROM to protocol limits in order to demonstrate functional improvement in R UE function for progression to next phase of protocol.   Goal status: MET  3.  Pt will be able to demonstrate AAROM without pain in order to demonstrate functional improvement in UE function for self-care and house hold duties.   Goal status: MET  LONG TERM GOALS: Target date: 04/22/2022  Pt  will become independent with final HEP in order to demonstrate synthesis of PT education.   Goal status: ongoing  2.  Pt will be able to demonstrate full A/PROM in order to demonstrate functional improvement in UE function for self-care and house hold duties.   Goal status: partially met with PROM  3.  Pt will be able to demonstrate ability to reach and set 1lb object onto shelf in order to demonstrate functional improvement in UE function for self-care and house hold  duties.   Goal status: ongoing  4.  Pt will be able to reach [facility] and carry/hold >5 lbs in order to demonstrate functional improvement in R UE strength for return to PLOF and exercise.   Goal status: ongoing   PLAN: PT FREQUENCY: 1-2x/week  PT DURATION: 12 weeks   PLANNED INTERVENTIONS: Therapeutic exercises, Therapeutic activity, Neuromuscular re-education, Balance training, Gait training, Patient/Family education, Joint manipulation, Joint mobilization, Aquatic Therapy, Dry Needling, Electrical stimulation, Spinal manipulation, Spinal mobilization, Cryotherapy, Moist heat, scar mobilization, Taping, Vasopneumatic device, Traction, Ultrasound, Ionotophoresis 61m/ml Dexamethasone,  and Manual therapy  PLAN FOR NEXT SESSION: Progress strengthening   ADaleen Bo PT 01/28/2022, 8:32 AM

## 2022-02-07 ENCOUNTER — Other Ambulatory Visit (HOSPITAL_COMMUNITY): Payer: Self-pay

## 2022-02-11 ENCOUNTER — Encounter (HOSPITAL_BASED_OUTPATIENT_CLINIC_OR_DEPARTMENT_OTHER): Payer: Self-pay

## 2022-02-11 ENCOUNTER — Ambulatory Visit (HOSPITAL_BASED_OUTPATIENT_CLINIC_OR_DEPARTMENT_OTHER): Payer: 59 | Admitting: Physical Therapy

## 2022-02-21 DIAGNOSIS — Z853 Personal history of malignant neoplasm of breast: Secondary | ICD-10-CM | POA: Diagnosis not present

## 2022-02-21 DIAGNOSIS — R87810 Cervical high risk human papillomavirus (HPV) DNA test positive: Secondary | ICD-10-CM | POA: Diagnosis not present

## 2022-02-21 DIAGNOSIS — Z6834 Body mass index (BMI) 34.0-34.9, adult: Secondary | ICD-10-CM | POA: Diagnosis not present

## 2022-02-21 DIAGNOSIS — Z1239 Encounter for other screening for malignant neoplasm of breast: Secondary | ICD-10-CM | POA: Diagnosis not present

## 2022-02-21 DIAGNOSIS — Z124 Encounter for screening for malignant neoplasm of cervix: Secondary | ICD-10-CM | POA: Diagnosis not present

## 2022-02-21 DIAGNOSIS — Z1211 Encounter for screening for malignant neoplasm of colon: Secondary | ICD-10-CM | POA: Diagnosis not present

## 2022-02-21 DIAGNOSIS — Z01419 Encounter for gynecological examination (general) (routine) without abnormal findings: Secondary | ICD-10-CM | POA: Diagnosis not present

## 2022-02-25 ENCOUNTER — Ambulatory Visit (HOSPITAL_BASED_OUTPATIENT_CLINIC_OR_DEPARTMENT_OTHER): Payer: 59 | Attending: Orthopaedic Surgery | Admitting: Physical Therapy

## 2022-02-25 ENCOUNTER — Encounter (HOSPITAL_BASED_OUTPATIENT_CLINIC_OR_DEPARTMENT_OTHER): Payer: Self-pay | Admitting: Physical Therapy

## 2022-02-25 DIAGNOSIS — M6281 Muscle weakness (generalized): Secondary | ICD-10-CM | POA: Insufficient documentation

## 2022-02-25 DIAGNOSIS — M25511 Pain in right shoulder: Secondary | ICD-10-CM | POA: Insufficient documentation

## 2022-02-25 DIAGNOSIS — R600 Localized edema: Secondary | ICD-10-CM | POA: Insufficient documentation

## 2022-02-25 DIAGNOSIS — M25611 Stiffness of right shoulder, not elsewhere classified: Secondary | ICD-10-CM | POA: Insufficient documentation

## 2022-02-25 NOTE — Therapy (Signed)
OUTPATIENT PHYSICAL THERAPY SHOULDER TREATMENT   Patient Name: Katelyn Reyes MRN: [id number] DOB:Nov 28, 1960, 61 y.o., female Today's Date: 02/25/2022   PT End of Session - 02/25/22 0808     Visit Number 24    Number of Visits 25    Date for PT Re-Evaluation 03/31/22    Authorization Type [employer]    PT Start Time 0805    PT Stop Time [phone]    PT Time Calculation (min) 30 min    Activity Tolerance Patient tolerated treatment well    Behavior During Therapy [facility] for tasks assessed/performed                                  Past Medical History:  Diagnosis Date   Allergic rhinitis    Anxiety    Arthritis    Asthma    related to seasonal allergies   Breast calcification, right 02/02/2013   Surgically excised 02/17/13 > path benign    Cancer ([place])    Depression    Family history of breast cancer 04/12/2021   Family history of ovarian cancer 04/12/2021   GERD (gastroesophageal reflux disease)    Hyperlipidemia    IUD 02/07/2010   removed   Rotator cuff tear    right   Past Surgical History:  Procedure Laterality Date   BREAST BIOPSY Right 02/17/2013   Procedure:  NEEDLE LOCALIZATION REMOVAL RIGHT BREAST CALCIFICATIONS;  Surgeon: Haywood Lasso, MD;  Location: [place];  Service: General;  Laterality: Right;   BREAST EXCISIONAL BIOPSY Right pt unsure   benign   BREAST RECONSTRUCTION WITH PLACEMENT OF TISSUE EXPANDER AND ALLODERM Bilateral 04/26/2021   Procedure: BREAST RECONSTRUCTION WITH PLACEMENT OF TISSUE EXPANDER AND ALLODERM;  Surgeon: Irene Limbo, MD;  Location: [place];  Service: Plastics;  Laterality: Bilateral;   BURCH PROCEDURE N/A 12/22/2013   Procedure: BURCH CYSTO URETHROPEXY, ANTERIOR AND POSTERIOR CULPORRHAPHY;  Surgeon: Ena Dawley, MD;  Location: [facility];  Service: Gynecology;  Laterality: N/A;   CERVICAL DISC ARTHROPLASTY N/A 11/12/2017   Procedure: ARTIFICIAL DISC REPLACEMENT  CERVICAL SIX-SEVEN;  Surgeon: Ashok Pall, MD;  Location: [place];  Service: Neurosurgery;  Laterality: N/A;  ARTIFICIAL DISC REPLACEMENT CERVICAL 6- CERVICAL 7   CHOLECYSTECTOMY N/A 10/09/2017   Procedure: LAPAROSCOPIC CHOLECYSTECTOMY WITH INTRAOPERATIVE CHOLANGIOGRAM;  Surgeon: Judeth Horn, MD;  Location: [place];  Service: General;  Laterality: N/A;   KNEE ARTHROSCOPY WITH MEDIAL MENISECTOMY Right 05/01/2020   Procedure: KNEE ARTHROSCOPY WITH PARTIAL MEDIAL MENISECTOMY,   PATELLA  CHONDROPLASTY;  Surgeon: Paralee Cancel, MD;  Location: [facility];  Service: Orthopedics;  Laterality: Right;   LIPOSUCTION WITH LIPOFILLING Bilateral 07/23/2021   Procedure: LIPOFILLING FROM ABDOMEN TO BILATERAL CHEST;  Surgeon: Irene Limbo, MD;  Location: [place];  Service: Plastics;  Laterality: Bilateral;   MASTECTOMY W/ SENTINEL NODE BIOPSY Right 04/26/2021   Procedure: RIGHT MASTECTOMY WITH RIGHT SENTINEL LYMPH NODE BIOPSY;  Surgeon: [name], MD;  Location: [place];  Service: General;  Laterality: Right;   MOUTH SURGERY     NASAL SINUS SURGERY     REMOVAL OF BILATERAL TISSUE EXPANDERS WITH PLACEMENT OF BILATERAL BREAST IMPLANTS Bilateral 07/23/2021   Procedure: REMOVAL OF BILATERAL TISSUE EXPANDERS WITH PLACEMENT OF BILATERAL BREAST SILICONE IMPLANTS;  Surgeon: Irene Limbo, MD;  Location: [place];  Service: Plastics;  Laterality: Bilateral;   SHOULDER ARTHROSCOPY WITH  ROTATOR CUFF REPAIR AND OPEN BICEPS TENODESIS Right 08/29/2021   Procedure: RIGHT SHOULDER ARTHROSCOPY WITH ROTATOR CUFF REPAIR WITH PATCH AUGMENTATION AND  BICEPS TENODESIS;  Surgeon: Vanetta Mulders, MD;  Location: [place];  Service: Orthopedics;  Laterality: Right;   TOTAL MASTECTOMY Left 04/26/2021   Procedure: LEFT TOTAL MASTECTOMY;  Surgeon: [name], MD;  Location: [place];  Service: General;  Laterality: Left;    Patient Active Problem List   Diagnosis Date Noted   Nontraumatic complete tear of right rotator cuff    Biceps tendinitis of right upper extremity    Breast cancer, right () 04/26/2021   Family history of breast cancer 04/12/2021   Malignant neoplasm of upper outer quadrant of female breast ([place]) 04/12/2021   Family history of colon cancer 04/12/2021   Family history of ovarian cancer 74/25/9563   Metabolic syndrome 87/56/4332   Diverticular disease of colon 07/17/2020   Family history of malignant neoplasm of gastrointestinal tract 07/17/2020   Fatty liver 95/18/8416   Nonalcoholic steatohepatitis ([place]) 07/17/2020   Personal history of colonic polyps 07/17/2020   Acute medial meniscal tear, right, subsequent encounter 05/01/2020   ETD (Eustachian tube dysfunction), bilateral 12/27/2019   Obesity (BMI 30-39.9) 07/12/2018   Cervical spondylosis with radiculopathy 11/12/2017   Vitamin D deficiency 07/07/2017   Urinary incontinence in female 12/22/2013   Asthma, mild intermittent 03/22/2013   Vaginal itching 06/01/2012   Thyromegaly 03/18/2012   General medical examination 03/14/2011   CHICKENPOX, HX OF 02/15/2010   Hyperlipidemia 02/14/2009   DEPRESSION 02/14/2009   ALLERGIC RHINITIS 02/14/2009    PCP: Midge Minium, MD  REFERRING PROVIDER: Midge Minium, MD  REFERRING DIAG:  [phone] (ICD-10-CM) - Complete rotator cuff tear or rupture of right shoulder, not specified as traumatic  M67.813 (ICD-10-CM) - Biceps tendonosis of right shoulder    THERAPY DIAG:  Muscle weakness (generalized)  Stiffness of right shoulder, not elsewhere classified  Localized edema  Acute pain of right shoulder   ONSET DATE: 08/29/21 Surgery   PROCEDURE: 1.  Right rotator cuff arthroscopic repair with collagen patch augmentation 2.  Right biceps tenodesis 3.  Right shoulder extensive debridement 4.  Right shoulder acromioplasty with subacromial  decompression  SUBJECTIVE:                                                                                                                                                                                      SUBJECTIVE STATEMENT: Pt states her R shoulder feels weaker than the L but has had no other issues. She feels that it gets fatigued/weak with repetition like cleaning the shower. Pt states her R  tricep feels like it is in a knot and feels like "cramping" Pt reports mixed compliance with HEP due to busy work schedule.  PERTINENT HISTORY: Breast cancer, double mastectomy  PAIN:  Are you having pain? 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 Complete tear of the supraspinatus tendon with 4 cm of retraction. 2. Severe tendinosis of the infraspinatus tendon with an interstitial tear. 3. Mild tendinosis of the intra-articular portion of the long head of the biceps tendon.   UPPER EXTREMITY ROM:    AROM 5/11 PROM 5/11 6/27 AROM  Shoulder flexion 60 165 162 supine  Shoulder extension       Shoulder abduction 75 150 140  Shoulder adduction       Shoulder internal rotation To belly belly T12  BHB reach  Shoulder external rotation 50  55 T2 BHB reach   (Blank rows = not tested)   UPPER EXTREMITY MMT:  6/27 UE HHD      L     R        Shoulder flexion 36.1 lbs 11.4  Shoulder extension    Shoulder abduction 19.7 11.5  Shoulder adduction    Shoulder internal rotation 15.1 15.6  Shoulder external rotation 16.0 10.0     TODAY'S TREATMENT:   9/12  UBE retro and fwd warm up L1 2 min  STM R Triceps  Inf and  post joint mob grade III R   8/15  UBE retro and fwd warm up L2.5 2 min  -90/90 ER 2lb DB, seated with pillow under arm 2x10 -Seated cable row 35lbs 3x10 Cable face pull 15lbs 2x10 -cable tricep press down 15lbs 2x10 -3lb shoulder ABD 2x10 -counter push up 2x10 -horizontal ABD 2x10  8/1  UBE retro and fwd warm up L2.5 2 min  -90/90 ER 2lb DB, seated with pillow under arm 2x10 -Seated cable row x10 25lbs, 35lbs 2x10 Cable face pull 15lbs 2x10 -cable tricep press down 15lbs 2x10 -2lb shoulder ABD 3x10 -2lb OHP 2x8 20x CW and CCW wall ball  -wall prtraction with pillowcase 2x10  7/18  Joint mobilization: Post and inf grade III R GHJ  -S/L ABD 3x10 1lb S/L ER 2lb 2x10 -S/L flexion 1lb 2x10 -standing OHP no weight 1 lb 2x10 -horizontal ABD YTB 2x10 - blue TB TB rowing 2x10 -YTB bilat ER 2x10 -table push up 3x10   6/27  UBE retro and fwd warm up  Joint mobilization: Post and inf grade III R GHJ STM R rhomboid/mid trap (active spasm)  6/20  UBE retro and fwd 72mn each  -S/L ABD 2x10 -S/L flexion 1lb 2x10 -standing OHP no weight 2x10 -horizontal ABD YTB 2x10 -GTB TB shoulder ext 3x10 -GTB standing ER 2x10 -wall push up 3x10   6/15  UBE retro and fwd 287m each  -S/L ABD 2x10 -S/L flexion 2x10 -standing OHP no weight 2x10 -GTB rowing 3x10 -blue TB shoulder ext 3x10 -prone T 3x10 -S/L 2lb ER 3x10 -wall push up 2x10 -wall ball CW and CCW 20x  Grade III R GHJ inf and post mob Manual stretch to end range   6/5  UBE retro and fwd 2m72meach  -S/L  ABD 1lb 10x, 10x without weight -supine flexion 3x10 -Supine protraction 3x10 1lb -supine shoulder ABC 2x 1lb weight -prone row 2lb 3x10 -Prone ext 3x10 -RTB rowing 3x10 -prone T 3x10 -S/L 1lb ER 3x10 -standing IR RTB 3x10    Previous: OH pulley's 2 min ABD and Flexion  -S/L ABD 2x10 -S/L flexion 2x10 -Supine AROM protraction 3x10 -supine shoulder ABC 2x 1lb weight -prone row 1lb  3x10 -RTB rowing 3x10 -prone T 3x10 -S/L 1lb ER 3x10 -standing IR RTB 3x10 -supine YTB horizontal ABD 2x10     PATIENT EDUCATION: Education details: exam findings, exercise focus, deficits/gains, exercise progression, muscle firing,  HEP modifications   Person educated: Patient Education method: Explanation, Demonstration, Tactile cues, Verbal cues, and Handouts Education comprehension: verbalized understanding, returned demonstration, verbal cues required, and tactile cues required   HOME EXERCISE PROGRAM: Access Code: [email or web address] URL: [email or web address] Date: 12/31/2021 Prepared by: Daleen Bo  Exercises - Standing Shoulder Scaption  - 1 x daily - 2 x weekly - 3 sets - 8 reps - Sidelying Shoulder Abduction Palm Forward  - 1 x daily - 2 x weekly - 3 sets - 8 reps - Shoulder Overhead Press in Flexion with Dumbbells  - 1 x daily - 2 x weekly - 3 sets - 8 reps - Shoulder External Rotation with Anchored Resistance  - 1 x daily - 2 x weekly - 3 sets - 8 reps - Standing Shoulder Row with Anchored Resistance  - 1 x daily - 2 x weekly - 3 sets - 10 reps   ASSESSMENT:  CLINICAL IMPRESSION:  Pt presents with R shoulder joint stifness and R triceps tightness at today's session that is likely due to report of recently high volume cleaning type motions. Pt had improvement of joint mobility following session. Pt's largest deficit continues to be strength. With work schedule limiting HEP compliance, HEP trimmed to 4 strength based exercises to help promote better strength training consistency. Pt to continue with independent strength at reduced frequency. Pt without pain. Pt would benefit from continued skilled therapy in order to reach goals and maximize functional R UE strength and ROM for full return to PLOF.     OBJECTIVE IMPAIRMENTS decreased ROM, decreased strength, hypomobility, increased fascial restrictions, increased muscle spasms, impaired flexibility, impaired UE  functional use, improper body mechanics, postural dysfunction, and pain.   ACTIVITY LIMITATIONS cleaning, community activity, driving, meal prep, occupation, laundry, medication management, yard work, shopping, and exercise .   PERSONAL FACTORS Age, Behavior pattern, Fitness, Time since onset of injury/illness/exacerbation, and 1-2 comorbidities:    are also affecting patient's functional outcome.    REHAB POTENTIAL: Good  CLINICAL DECISION MAKING: Stable/uncomplicated  EVALUATION COMPLEXITY: Low   GOALS:   SHORT TERM GOALS: Target date: 04/08/2022  Pt will become independent with HEP in order to demonstrate synthesis of PT education.   Goal status: MET  2.  Pt will be able to demonstrate PROM to protocol limits in order to demonstrate functional improvement in R UE function for progression to next phase of protocol.   Goal status: MET  3.  Pt will be able to demonstrate AAROM without pain in order to demonstrate functional improvement in UE function for self-care and house hold duties.   Goal status: MET  LONG TERM GOALS: Target date: 05/20/2022  Pt  will become independent with final HEP in order to demonstrate synthesis of PT education.   Goal status: ongoing  2.  Pt will be able to demonstrate full A/PROM  in order to demonstrate functional improvement in UE function for self-care and house hold duties.   Goal status: MET  3.  Pt will be able to demonstrate ability to reach and set 1lb object onto shelf in order to demonstrate functional improvement in UE function for self-care and house hold duties.   Goal status: MET  4.  Pt will be able to reach [facility] and carry/hold >5 lbs in order to demonstrate functional improvement in R UE strength for return to PLOF and exercise.   Goal status: ongoing   PLAN: PT FREQUENCY: 1-2x/week  PT DURATION: 12 weeks   PLANNED INTERVENTIONS: Therapeutic exercises, Therapeutic activity, Neuromuscular re-education, Balance  training, Gait training, Patient/Family education, Joint manipulation, Joint mobilization, Aquatic Therapy, Dry Needling, Electrical stimulation, Spinal manipulation, Spinal mobilization, Cryotherapy, Moist heat, scar mobilization, Taping, Vasopneumatic device, Traction, Ultrasound, Ionotophoresis 26m/ml Dexamethasone, and Manual therapy  PLAN FOR NEXT SESSION: Progress strengthening   ADaleen Bo PT 02/25/2022, 8:41 AM

## 2022-03-03 LAB — HM PAP SMEAR
HPV 16/18/45 genotyping: NEGATIVE
HPV, high-risk: POSITIVE

## 2022-03-06 ENCOUNTER — Other Ambulatory Visit (HOSPITAL_COMMUNITY): Payer: Self-pay

## 2022-03-06 ENCOUNTER — Other Ambulatory Visit: Payer: Self-pay | Admitting: Family Medicine

## 2022-03-06 ENCOUNTER — Other Ambulatory Visit (HOSPITAL_BASED_OUTPATIENT_CLINIC_OR_DEPARTMENT_OTHER): Payer: Self-pay

## 2022-03-07 ENCOUNTER — Other Ambulatory Visit (HOSPITAL_COMMUNITY): Payer: Self-pay

## 2022-03-07 ENCOUNTER — Other Ambulatory Visit (HOSPITAL_BASED_OUTPATIENT_CLINIC_OR_DEPARTMENT_OTHER): Payer: Self-pay

## 2022-03-07 MED ORDER — SAXENDA 18 MG/3ML ~~LOC~~ SOPN
PEN_INJECTOR | SUBCUTANEOUS | 0 refills | Status: AC
  Filled 2022-03-07: qty 15, 30d supply, fill #0

## 2022-03-10 ENCOUNTER — Other Ambulatory Visit (HOSPITAL_COMMUNITY): Payer: Self-pay

## 2022-03-10 ENCOUNTER — Other Ambulatory Visit: Payer: Self-pay | Admitting: Family Medicine

## 2022-03-10 NOTE — Telephone Encounter (Signed)
Last filled 03/07/22 pt is requesting a "whole box"

## 2022-03-10 NOTE — Telephone Encounter (Signed)
Encourage patient to contact the pharmacy for refills or they can request refills through [facility]  (Please schedule appointment if patient has not been seen in over a year)    [place] TO: [place]: saxenda 18 mg/75m sopn  NOTES/COMMENTS FROM PATIENT:  Pt states that she "wants a whole box"     FY[place]office please notify patient: It takes 48-72 hours to process rx refill requests Ask patient to call pharmacy to ensure rx is ready before heading there.

## 2022-03-11 ENCOUNTER — Other Ambulatory Visit (HOSPITAL_BASED_OUTPATIENT_CLINIC_OR_DEPARTMENT_OTHER): Payer: Self-pay

## 2022-03-11 ENCOUNTER — Encounter (HOSPITAL_BASED_OUTPATIENT_CLINIC_OR_DEPARTMENT_OTHER): Payer: Self-pay | Admitting: Physical Therapy

## 2022-03-11 ENCOUNTER — Other Ambulatory Visit (HOSPITAL_COMMUNITY): Payer: Self-pay

## 2022-03-11 ENCOUNTER — Ambulatory Visit (HOSPITAL_BASED_OUTPATIENT_CLINIC_OR_DEPARTMENT_OTHER): Payer: 59 | Admitting: Physical Therapy

## 2022-03-11 DIAGNOSIS — M25511 Pain in right shoulder: Secondary | ICD-10-CM | POA: Diagnosis not present

## 2022-03-11 DIAGNOSIS — R600 Localized edema: Secondary | ICD-10-CM

## 2022-03-11 DIAGNOSIS — M25611 Stiffness of right shoulder, not elsewhere classified: Secondary | ICD-10-CM

## 2022-03-11 DIAGNOSIS — M6281 Muscle weakness (generalized): Secondary | ICD-10-CM

## 2022-03-11 MED ORDER — SAXENDA 18 MG/3ML ~~LOC~~ SOPN
3.0000 mg | PEN_INJECTOR | Freq: Every day | SUBCUTANEOUS | 3 refills | Status: DC
  Filled 2022-03-11 – 2022-04-16 (×2): qty 15, 30d supply, fill #0
  Filled 2022-05-27: qty 15, 30d supply, fill #1

## 2022-03-11 NOTE — Therapy (Signed)
OUTPATIENT PHYSICAL THERAPY SHOULDER TREATMENT   Patient Name: Katelyn Reyes MRN: [id number] DOB:01/27/1961, 61 y.o., female Today's Date: 03/11/2022   PT End of Session - 03/11/22 0809     Visit Number 25    Number of Visits 30    Date for PT Re-Evaluation 06/09/22    Authorization Type [employer]    PT Start Time 0805    PT Stop Time 0830    PT Time Calculation (min) 25 min    Activity Tolerance Patient tolerated treatment well    Behavior During Therapy [facility] for tasks assessed/performed                                  Past Medical History:  Diagnosis Date   Allergic rhinitis    Anxiety    Arthritis    Asthma    related to seasonal allergies   Breast calcification, right 02/02/2013   Surgically excised 02/17/13 > path benign    Cancer ([place])    Depression    Family history of breast cancer 04/12/2021   Family history of ovarian cancer 04/12/2021   GERD (gastroesophageal reflux disease)    Hyperlipidemia    IUD 02/07/2010   removed   Rotator cuff tear    right   Past Surgical History:  Procedure Laterality Date   BREAST BIOPSY Right 02/17/2013   Procedure:  NEEDLE LOCALIZATION REMOVAL RIGHT BREAST CALCIFICATIONS;  Surgeon: Haywood Lasso, MD;  Location: [place];  Service: General;  Laterality: Right;   BREAST EXCISIONAL BIOPSY Right pt unsure   benign   BREAST RECONSTRUCTION WITH PLACEMENT OF TISSUE EXPANDER AND ALLODERM Bilateral 04/26/2021   Procedure: BREAST RECONSTRUCTION WITH PLACEMENT OF TISSUE EXPANDER AND ALLODERM;  Surgeon: Irene Limbo, MD;  Location: [place];  Service: Plastics;  Laterality: Bilateral;   BURCH PROCEDURE N/A 12/22/2013   Procedure: BURCH CYSTO URETHROPEXY, ANTERIOR AND POSTERIOR CULPORRHAPHY;  Surgeon: Ena Dawley, MD;  Location: [facility];  Service: Gynecology;  Laterality: N/A;   CERVICAL DISC ARTHROPLASTY N/A 11/12/2017   Procedure: ARTIFICIAL DISC REPLACEMENT  CERVICAL SIX-SEVEN;  Surgeon: Ashok Pall, MD;  Location: [place];  Service: Neurosurgery;  Laterality: N/A;  ARTIFICIAL DISC REPLACEMENT CERVICAL 6- CERVICAL 7   CHOLECYSTECTOMY N/A 10/09/2017   Procedure: LAPAROSCOPIC CHOLECYSTECTOMY WITH INTRAOPERATIVE CHOLANGIOGRAM;  Surgeon: Judeth Horn, MD;  Location: [place];  Service: General;  Laterality: N/A;   KNEE ARTHROSCOPY WITH MEDIAL MENISECTOMY Right 05/01/2020   Procedure: KNEE ARTHROSCOPY WITH PARTIAL MEDIAL MENISECTOMY,   PATELLA  CHONDROPLASTY;  Surgeon: Paralee Cancel, MD;  Location[facility];  Service: Orthopedics;  Laterality: Right;   LIPOSUCTION WITH LIPOFILLING Bilateral 07/23/2021   Procedure: LIPOFILLING FROM ABDOMEN TO BILATERAL CHEST;  Surgeon: Irene Limbo, MD;  Location: [place];  Service: Plastics;  Laterality: Bilateral;   MASTECTOMY W/ SENTINEL NODE BIOPSY Right 04/26/2021   Procedure: RIGHT MASTECTOMY WITH RIGHT SENTINEL LYMPH NODE BIOPSY;  Surgeon: [name], MD;  Location: [place];  Service: General;  Laterality: Right;   MOUTH SURGERY     NASAL SINUS SURGERY     REMOVAL OF BILATERAL TISSUE EXPANDERS WITH PLACEMENT OF BILATERAL BREAST IMPLANTS Bilateral 07/23/2021   Procedure: REMOVAL OF BILATERAL TISSUE EXPANDERS WITH PLACEMENT OF BILATERAL BREAST SILICONE IMPLANTS;  Surgeon: Irene Limbo, MD;  Location: [place];  Service: Plastics;  Laterality: Bilateral;   SHOULDER ARTHROSCOPY WITH  ROTATOR CUFF REPAIR AND OPEN BICEPS TENODESIS Right 08/29/2021   Procedure: RIGHT SHOULDER ARTHROSCOPY WITH ROTATOR CUFF REPAIR WITH PATCH AUGMENTATION AND  BICEPS TENODESIS;  Surgeon: Vanetta Mulders, MD;  Location: [place];  Service: Orthopedics;  Laterality: Right;   TOTAL MASTECTOMY Left 04/26/2021   Procedure: LEFT TOTAL MASTECTOMY;  Surgeon: [name], MD;  Location: [place];  Service: General;  Laterality: Left;    Patient Active Problem List   Diagnosis Date Noted   Nontraumatic complete tear of right rotator cuff    Biceps tendinitis of right upper extremity    Breast cancer, right ([place]) 04/26/2021   Family history of breast cancer 04/12/2021   Malignant neoplasm of upper outer quadrant of female breast ([place]) 04/12/2021   Family history of colon cancer 04/12/2021   Family history of ovarian cancer 25/42/7062   Metabolic syndrome 37/62/8315   Diverticular disease of colon 07/17/2020   Family history of malignant neoplasm of gastrointestinal tract 07/17/2020   Fatty liver 17/61/6073   Nonalcoholic steatohepatitis ([facility]) 07/17/2020   Personal history of colonic polyps 07/17/2020   Acute medial meniscal tear, right, subsequent encounter 05/01/2020   ETD (Eustachian tube dysfunction), bilateral 12/27/2019   Obesity (BMI 30-39.9) 07/12/2018   Cervical spondylosis with radiculopathy 11/12/2017   Vitamin D deficiency 07/07/2017   Urinary incontinence in female 12/22/2013   Asthma, mild intermittent 03/22/2013   Vaginal itching 06/01/2012   Thyromegaly 03/18/2012   General medical examination 03/14/2011   CHICKENPOX, HX OF 02/15/2010   Hyperlipidemia 02/14/2009   DEPRESSION 02/14/2009   ALLERGIC RHINITIS 02/14/2009    PCP: Midge Minium, MD  REFERRING PROVIDER: Midge Minium, MD  REFERRING DIAG:  [phone] (ICD-10-CM) - Complete rotator cuff tear or rupture of right shoulder, not specified as traumatic  M67.813 (ICD-10-CM) - Biceps tendonosis of right shoulder    THERAPY DIAG:  Muscle weakness (generalized) - Plan: PT plan of care cert/re-cert  Stiffness of right shoulder, not elsewhere classified - Plan: PT plan of care cert/re-cert  Localized edema - Plan: PT plan of care cert/re-cert  Acute pain of right shoulder - Plan: PT plan of care cert/re-cert   ONSET DATE: 9/48/54 Surgery   PROCEDURE: 1.  Right rotator cuff arthroscopic repair with collagen patch  augmentation 2.  Right biceps tenodesis 3.  Right shoulder extensive debridement 4.  Right shoulder acromioplasty with subacromial decompression  SUBJECTIVE:                                                                                                                                                                                      SUBJECTIVE STATEMENT: Pt states her R  shoulder feels weaker than the L but has had no other issues. She feels that it gets fatigued/weak with repetition like cleaning the shower. Pt states her R tricep feels like it is in a knot and feels like "cramping" Pt reports mixed compliance with HEP due to busy work schedule.  PERTINENT HISTORY: Breast cancer, double mastectomy  PAIN:  Are you having pain? 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 Complete tear of the supraspinatus tendon with 4 cm of retraction. 2. Severe tendinosis of the infraspinatus tendon with an interstitial tear. 3. Mild tendinosis of the intra-articular portion of the long head of the biceps tendon.   UPPER EXTREMITY ROM:    AROM 5/11 PROM 5/11 6/27 AROM 9/26  Shoulder flexion 60 165 162 supine 165 standing  Shoulder extension        Shoulder abduction 75 150 140 155  Shoulder adduction        Shoulder internal rotation To belly belly T12  BHB reach T12  BHB reach  Shoulder external rotation 50  55 T2 BHB reach  65  (Blank rows = not tested)   UPPER EXTREMITY MMT:  6/27 UE HHD      L     R     R__9/26___________   Shoulder flexion 36.1 lbs 11.4 15.2  Shoulder extension     Shoulder abduction 19.7 11.5 19.6   Shoulder adduction     Shoulder internal rotation 15.1 15.6 21.8  Shoulder external rotation 16.0 10.0 14.4     TODAY'S TREATMENT:   9/26  UBE retro and fwd warm up L1 2 min each  Inf and post joint mob grade IV R   Home Exercise Update and Review  - Standing Shoulder Row with Anchored Resistance  - 1 x daily - 2-3 x weekly - 3 sets - 10 reps - Shoulder Abduction with Dumbbells - Thumbs Up  - 1 x daily - 2-3 x weekly - 3 sets - 10 reps - Push-Up on Counter  - 1 x daily - 2-3 x weekly - 2 sets - 10 reps - Shoulder External Rotation and Scapular Retraction with Resistance  - 1 x daily - 2-3 x weekly - 3 sets - 10 reps - Standing Shoulder Flexion with Resistance  - 1 x daily - 2-3 x weekly - 3 sets - 10 reps  9/12  UBE retro and fwd warm up L1 2 min  STM R Triceps  Inf and post joint mob grade III R   8/15  UBE retro and fwd warm up L2.5 2 min  -90/90 ER 2lb DB, seated with pillow under arm 2x10 -Seated cable row 35lbs 3x10 Cable face pull 15lbs 2x10 -cable tricep press down 15lbs 2x10 -3lb shoulder ABD 2x10 -counter push up 2x10 -horizontal ABD 2x10  8/1  UBE retro and fwd warm up L2.5 2 min  -90/90 ER 2lb DB, seated with pillow under arm 2x10 -Seated cable row x10 25lbs, 35lbs 2x10 Cable face pull 15lbs 2x10 -cable tricep press down 15lbs 2x10 -2lb shoulder ABD 3x10 -2lb OHP 2x8 20x CW and CCW wall ball  -wall prtraction with pillowcase 2x10   PATIENT EDUCATION:  Education details: exam findings, strength focus, deficits/gains, exercise progression, muscle firing,  HEP modifications   Person educated: Patient Education method: Explanation, Demonstration, Tactile cues, Verbal cues, and Handouts Education comprehension: verbalized understanding, returned demonstration, verbal cues required, and tactile cues required   HOME EXERCISE PROGRAM: Access Code: [email or web address] URL: [email or web address] Date: 12/31/2021 Prepared by: Daleen Bo   ASSESSMENT:  CLINICAL IMPRESSION:  Pt testing demo's significant improvement with R shoulder cuff strength as compared to previous measurement. However, pt is still most limited with fwd flexion at this time. Other measurements are within 90% of L UE. Pt HEP updated and pt given edu about focus on deficits and weak muscle groups to prioritize over the next few visits. Pt likely to D/C within 3-4 visits once strength is maximized. Pt would benefit from continued skilled therapy in order to reach goals and maximize functional R UE strength and ROM for full return to PLOF.     OBJECTIVE IMPAIRMENTS decreased ROM, decreased strength, hypomobility, increased fascial restrictions, increased muscle spasms, impaired flexibility, impaired UE functional use, improper body mechanics, postural dysfunction, and pain.   ACTIVITY LIMITATIONS cleaning, community activity, driving, meal prep, occupation, laundry, medication management, yard work, shopping, and exercise .   PERSONAL FACTORS Age, Behavior pattern, Fitness, Time since onset of injury/illness/exacerbation, and 1-2 comorbidities:    are also affecting patient's functional outcome.    REHAB POTENTIAL: Good  CLINICAL DECISION MAKING: Stable/uncomplicated  EVALUATION COMPLEXITY: Low   GOALS:   SHORT TERM GOALS: Target date: 04/22/2022  Pt will become independent with HEP in order to demonstrate synthesis of PT education.   Goal status: MET  2.  Pt will be able to demonstrate PROM to protocol limits in order to demonstrate functional improvement in R UE function for progression to next phase of protocol.   Goal status: MET  3.  Pt will be able to demonstrate AAROM without pain in order to demonstrate functional improvement in UE function for self-care and house hold duties.   Goal status: MET  LONG TERM GOALS: Target date: 06/03/2022  Pt  will become independent with final HEP in order to demonstrate synthesis of PT  education.   Goal status: ongoing  2.  Pt will be able to demonstrate full A/PROM in order to demonstrate functional improvement in UE function for self-care and house hold duties.   Goal status: MET  3.  Pt will be able to demonstrate ability to reach and set 1lb object onto shelf in order to demonstrate functional improvement in UE function for self-care and house hold duties.   Goal status: MET  4.  Pt will be able to reach [facility] and carry/hold >5 lbs in order to demonstrate functional improvement in R UE strength for return to PLOF and exercise.   Goal status: ongoing   PLAN: PT FREQUENCY: 1x every 2-3 wks  PT DURATION: 12 weeks   PLANNED INTERVENTIONS: Therapeutic exercises, Therapeutic activity, Neuromuscular re-education, Balance training, Gait training, Patient/Family education, Joint manipulation, Joint mobilization, Aquatic Therapy, Dry Needling, Electrical stimulation, Spinal manipulation, Spinal mobilization, Cryotherapy, Moist heat, scar mobilization, Taping, Vasopneumatic device, Traction, Ultrasound, Ionotophoresis 67m/ml Dexamethasone, and Manual therapy  PLAN FOR NEXT SESSION: Progress OH strengthening   ADaleen Bo PT 03/11/2022, 8:38 AM

## 2022-03-12 ENCOUNTER — Other Ambulatory Visit (HOSPITAL_BASED_OUTPATIENT_CLINIC_OR_DEPARTMENT_OTHER): Payer: Self-pay

## 2022-03-12 MED ORDER — FLUARIX QUADRIVALENT 0.5 ML IM SUSY
PREFILLED_SYRINGE | INTRAMUSCULAR | 0 refills | Status: DC
  Filled 2022-03-12: qty 0.5, 1d supply, fill #0

## 2022-03-25 ENCOUNTER — Ambulatory Visit (HOSPITAL_BASED_OUTPATIENT_CLINIC_OR_DEPARTMENT_OTHER): Payer: 59 | Admitting: Physical Therapy

## 2022-04-03 DIAGNOSIS — Z853 Personal history of malignant neoplasm of breast: Secondary | ICD-10-CM | POA: Diagnosis not present

## 2022-04-03 DIAGNOSIS — L905 Scar conditions and fibrosis of skin: Secondary | ICD-10-CM | POA: Diagnosis not present

## 2022-04-03 DIAGNOSIS — Z9013 Acquired absence of bilateral breasts and nipples: Secondary | ICD-10-CM | POA: Diagnosis not present

## 2022-04-04 ENCOUNTER — Encounter: Payer: Self-pay | Admitting: Hematology & Oncology

## 2022-04-04 ENCOUNTER — Inpatient Hospital Stay (HOSPITAL_BASED_OUTPATIENT_CLINIC_OR_DEPARTMENT_OTHER): Payer: 59 | Admitting: Hematology & Oncology

## 2022-04-04 ENCOUNTER — Other Ambulatory Visit (HOSPITAL_BASED_OUTPATIENT_CLINIC_OR_DEPARTMENT_OTHER): Payer: Self-pay

## 2022-04-04 ENCOUNTER — Inpatient Hospital Stay: Payer: 59 | Attending: Hematology & Oncology

## 2022-04-04 VITALS — BP 111/87 | HR 78 | Temp 98.0°F | Resp 20 | Ht 62.0 in | Wt 190.4 lb

## 2022-04-04 DIAGNOSIS — Z170 Estrogen receptor positive status [ER+]: Secondary | ICD-10-CM | POA: Diagnosis not present

## 2022-04-04 DIAGNOSIS — Z79811 Long term (current) use of aromatase inhibitors: Secondary | ICD-10-CM | POA: Diagnosis not present

## 2022-04-04 DIAGNOSIS — C50411 Malignant neoplasm of upper-outer quadrant of right female breast: Secondary | ICD-10-CM

## 2022-04-04 DIAGNOSIS — C50911 Malignant neoplasm of unspecified site of right female breast: Secondary | ICD-10-CM | POA: Diagnosis not present

## 2022-04-04 LAB — CBC WITH DIFFERENTIAL (CANCER CENTER ONLY)
Abs Immature Granulocytes: 0.02 10*3/uL (ref 0.00–0.07)
Basophils Absolute: 0 10*3/uL (ref 0.0–0.1)
Basophils Relative: 0 %
Eosinophils Absolute: 0 10*3/uL (ref 0.0–0.5)
Eosinophils Relative: 0 %
HCT: 38.4 % (ref 36.0–46.0)
Hemoglobin: 12.5 g/dL (ref 12.0–15.0)
Immature Granulocytes: 0 %
Lymphocytes Relative: 35 %
Lymphs Abs: 3.1 10*3/uL (ref 0.7–4.0)
MCH: 30.9 pg (ref 26.0–34.0)
MCHC: 32.6 g/dL (ref 30.0–36.0)
MCV: 95 fL (ref 80.0–100.0)
Monocytes Absolute: 0.6 10*3/uL (ref 0.1–1.0)
Monocytes Relative: 7 %
Neutro Abs: 5.1 10*3/uL (ref 1.7–7.7)
Neutrophils Relative %: 58 %
Platelet Count: 294 10*3/uL (ref 150–400)
RBC: 4.04 MIL/uL (ref 3.87–5.11)
RDW: 11.8 % (ref 11.5–15.5)
WBC Count: 8.9 10*3/uL (ref 4.0–10.5)
nRBC: 0 % (ref 0.0–0.2)

## 2022-04-04 LAB — CMP (CANCER CENTER ONLY)
ALT: 21 U/L (ref 0–44)
AST: 24 U/L (ref 15–41)
Albumin: 4.4 g/dL (ref 3.5–5.0)
Alkaline Phosphatase: 74 U/L (ref 38–126)
Anion gap: 9 (ref 5–15)
BUN: 21 mg/dL — ABNORMAL HIGH (ref 6–20)
CO2: 27 mmol/L (ref 22–32)
Calcium: 9.9 mg/dL (ref 8.9–10.3)
Chloride: 106 mmol/L (ref 98–111)
Creatinine: 0.92 mg/dL (ref 0.44–1.00)
GFR, Estimated: >60 mL/min (ref >60)
Glucose, Bld: 98 mg/dL (ref 70–99)
Potassium: 3.9 mmol/L (ref 3.5–5.1)
Sodium: 142 mmol/L (ref 135–145)
Total Bilirubin: 0.4 mg/dL (ref 0.3–1.2)
Total Protein: 7.7 g/dL (ref 6.5–8.1)

## 2022-04-04 LAB — VITAMIN D 25 HYDROXY (VIT D DEFICIENCY, FRACTURES): Vit D, 25-Hydroxy: 38.72 ng/mL (ref 30–100)

## 2022-04-04 LAB — LACTATE DEHYDROGENASE: LDH: 242 U/L — ABNORMAL HIGH (ref 98–192)

## 2022-04-04 MED ORDER — COMIRNATY 30 MCG/0.3ML IM SUSY
PREFILLED_SYRINGE | INTRAMUSCULAR | 0 refills | Status: DC
  Filled 2022-04-04: qty 0.3, 1d supply, fill #0

## 2022-04-04 NOTE — Progress Notes (Signed)
Hematology and Oncology Follow Up Visit  Katelyn Reyes [id number] September 09, 1960 61 y.o. 04/04/2022   Principle Diagnosis:  Stage IA (T1aN0M0) infiltrating ductal carcinoma of the right breast- ER+/PR+/HER2-  Current Therapy:   Status post bilateral mastectomies --04/26/2021 Arimidex 1 mg p.o. daily --start on 06/29/2021     Interim History:  [name] is back for follow-up.  She is little bit tired this morning.  She is doing some homework for her class.  She was up earlier this morning.  Thankfully, she has today off.  She is doing well with the Arimidex.  The gabapentin really has helped with her hot flashes.  She has had no problems with cough or shortness of breath.  There is been no issues with COVID.  She has had no change in bowel or bladder habits.  She has had no nausea or vomiting.  There is been no rashes.  She has had no leg swelling.  She has had no bleeding.  Overall, I would say performance status is probably ECOG 1.    Medications:  Current Outpatient Medications:    ALPRAZolam (XANAX) 0.5 MG tablet, Take 1 tablet (0.5 mg total) by mouth 3 (three) times daily as needed., Disp: 60 tablet, Rfl: 3   anastrozole (ARIMIDEX) 1 MG tablet, Take 1 tablet (1 mg total) by mouth daily., Disp: 90 tablet, Rfl: 12   cetirizine (ZYRTEC) 10 MG tablet, Take 1 tablet (10 mg total) by mouth daily., Disp: 90 tablet, Rfl: 1   Cholecalciferol (VITAMIN D-3) 125 MCG (5000 UT) TABS, Take 5,000 Units by mouth every evening., Disp: , Rfl:    fluticasone (FLONASE) 50 MCG/ACT nasal spray, Place 2 sprays into both nostrils daily., Disp: 16 g, Rfl: 6   gabapentin (NEURONTIN) 300 MG capsule, Take 2 capsules (600 mg total) by mouth at bedtime., Disp: 60 capsule, Rfl: 4   Insulin Pen Needle 32G X 4 MM MISC, Use daily w/ saxenda, Disp: 100 each, Rfl: 3   Liraglutide -Weight Management (SAXENDA) 18 MG/3ML SOPN, Inject 0.6 mg into the skin daily for 7 days, THEN 1.2 mg daily for 7 days, THEN 1.8 mg  daily for 7 days, THEN 2.4 mg daily for 7 days, THEN 3 mg daily., Disp: 15 mL, Rfl: 0   Liraglutide -Weight Management (SAXENDA) 18 MG/3ML SOPN, Inject 3 mg into the skin daily., Disp: 15 mL, Rfl: 3   montelukast (SINGULAIR) 10 MG tablet, Take 1 tablet (10 mg total) by mouth daily., Disp: 90 tablet, Rfl: 1   pantoprazole (PROTONIX) 40 MG tablet, Take 1 tablet (40 mg total) by mouth 2 (two) times daily., Disp: 180 tablet, Rfl: 1   sertraline (ZOLOFT) 100 MG tablet, Take 1 tablet (100 mg total) by mouth daily., Disp: 90 tablet, Rfl: 1   zolpidem (AMBIEN) 10 MG tablet, Take 1 tablet (10 mg total) by mouth at bedtime as needed for sleep., Disp: 30 tablet, Rfl: 3   albuterol (VENTOLIN HFA) 108 (90 Base) MCG/ACT inhaler, Inhale 2 puffs into the lungs every 6 (six) hours as needed for wheezing. (Patient not taking: Reported on 04/04/2022), Disp: 18 g, Rfl: 6  Allergies:  Allergies  Allergen Reactions   Amoxicillin Rash and Other (See Comments)    Has patient had a PCN reaction causing immediate rash, facial/tongue/throat swelling, SOB or lightheadedness with hypotension: No Has patient had a PCN reaction causing severe rash involving mucus membranes or skin necrosis: No Has patient had a PCN reaction that required treatment: #  #  #  YES  #  #  # - MD office Has patient had a PCN reaction occurring within the last 10 years: Yes If all of the above answers are "NO", then may proceed with Cephalosporin use.    Erythromycin Nausea Only   Hydrocodone-Acetaminophen Other (See Comments)    Hallucinations   Penicillin G Rash   Shellfish Allergy Itching and Other (See Comments)    REACTS TO SCALLOPS     Past Medical History, Surgical history, Social history, and Family History were reviewed and updated.  Review of Systems: Review of Systems  Constitutional:  Positive for appetite change.  HENT:  Negative.    Eyes: Negative.   Respiratory: Negative.    Cardiovascular: Negative.    Gastrointestinal:  Positive for diarrhea and vomiting.  Endocrine: Negative.   Genitourinary: Negative.    Musculoskeletal: Negative.   Skin: Negative.   Neurological: Negative.   Hematological: Negative.   Psychiatric/Behavioral: Negative.      Physical Exam:  height is 5' 2"  (1.575 m) and weight is 190 lb 6.4 oz (86.4 kg). Her temperature is 98 F (36.7 C). Her blood pressure is 111/87 and her pulse is 78. Her respiration is 20 and oxygen saturation is 99%.   Wt Readings from Last 3 Encounters:  04/04/22 190 lb 6.4 oz (86.4 kg)  01/17/22 190 lb 6.4 oz (86.4 kg)  01/10/22 190 lb (86.2 kg)    Physical Exam Vitals reviewed.  Constitutional:      Comments: Breast exam shows status post mastectomies.  She has implants.  Everything looks to be healing nicely.  There is no erythema.  There is no exudate.  The wounds are intact.  There is no bilateral axillary adenopathy.    HENT:     Head: Normocephalic and atraumatic.  Eyes:     Pupils: Pupils are equal, round, and reactive to light.  Cardiovascular:     Rate and Rhythm: Normal rate and regular rhythm.     Heart sounds: Normal heart sounds.  Pulmonary:     Effort: Pulmonary effort is normal.     Breath sounds: Normal breath sounds.  Abdominal:     General: Bowel sounds are normal.     Palpations: Abdomen is soft.     Comments: Abdominal exam is soft.  There is no distention.  There is some tenderness to palpation.  She has no fluid wave.  Bowel sounds are present.  There is no palpable liver or spleen tip.  Musculoskeletal:        General: No tenderness or deformity. Normal range of motion.     Cervical back: Normal range of motion.     Comments: Extremities shows the right shoulder with the arthroscopic scars.    Lymphadenopathy:     Cervical: No cervical adenopathy.  Skin:    General: Skin is warm and dry.     Findings: No erythema or rash.  Neurological:     Mental Status: She is alert and oriented to person, place,  and time.  Psychiatric:        Behavior: Behavior normal.        Thought Content: Thought content normal.        Judgment: Judgment normal.      Lab Results  Component Value Date   WBC 8.9 04/04/2022   HGB 12.5 04/04/2022   HCT 38.4 04/04/2022   MCV 95.0 04/04/2022   PLT 294 04/04/2022     Chemistry      Component Value Date/Time  NA 145 01/10/2022 0752   K 4.5 01/10/2022 0752   CL 108 01/10/2022 0752   CO2 29 01/10/2022 0752   BUN 20 01/10/2022 0752   CREATININE 1.06 (H) 01/10/2022 0752   CREATININE 0.92 04/28/2017 1645      Component Value Date/Time   CALCIUM 9.9 01/10/2022 0752   ALKPHOS 82 01/10/2022 0752   AST 29 01/10/2022 0752   ALT 35 01/10/2022 0752   BILITOT 0.4 01/10/2022 0752      Impression and Plan: Ms. Sutter is a very charming 61 year old African-American female.  She is postmenopausal.  She has a very early stage ductal carcinoma of the right breast.  Again, I do suspect that with surgery alone, her chance of cure is probably 90%.  We will continue the Arimidex.    I told her that we do not have to do any kind of scans unless we see any problems with her labs with anything on her physical exam or if she has any New symptoms.  Her new puppy is now about a-year-old.  I think this is a major schnauzer.  I know that she is busy with him.  We will go ahead and get her back in 3 more months.    Volanda Napoleon, MD 10/20/20238:12 AM

## 2022-04-08 ENCOUNTER — Ambulatory Visit (HOSPITAL_BASED_OUTPATIENT_CLINIC_OR_DEPARTMENT_OTHER): Payer: 59 | Admitting: Physical Therapy

## 2022-04-11 ENCOUNTER — Ambulatory Visit (HOSPITAL_BASED_OUTPATIENT_CLINIC_OR_DEPARTMENT_OTHER): Payer: 59 | Attending: Orthopaedic Surgery | Admitting: Physical Therapy

## 2022-04-11 DIAGNOSIS — M6281 Muscle weakness (generalized): Secondary | ICD-10-CM | POA: Insufficient documentation

## 2022-04-11 DIAGNOSIS — M25511 Pain in right shoulder: Secondary | ICD-10-CM | POA: Insufficient documentation

## 2022-04-11 DIAGNOSIS — R600 Localized edema: Secondary | ICD-10-CM | POA: Insufficient documentation

## 2022-04-11 DIAGNOSIS — M25611 Stiffness of right shoulder, not elsewhere classified: Secondary | ICD-10-CM | POA: Insufficient documentation

## 2022-04-16 ENCOUNTER — Other Ambulatory Visit (HOSPITAL_BASED_OUTPATIENT_CLINIC_OR_DEPARTMENT_OTHER): Payer: Self-pay

## 2022-04-17 ENCOUNTER — Ambulatory Visit (HOSPITAL_BASED_OUTPATIENT_CLINIC_OR_DEPARTMENT_OTHER): Payer: 59 | Admitting: Physical Therapy

## 2022-04-17 ENCOUNTER — Other Ambulatory Visit (HOSPITAL_BASED_OUTPATIENT_CLINIC_OR_DEPARTMENT_OTHER): Payer: Self-pay

## 2022-04-18 ENCOUNTER — Ambulatory Visit: Payer: 59 | Admitting: Family Medicine

## 2022-04-21 ENCOUNTER — Encounter: Payer: Self-pay | Admitting: Family Medicine

## 2022-04-21 ENCOUNTER — Ambulatory Visit (INDEPENDENT_AMBULATORY_CARE_PROVIDER_SITE_OTHER): Payer: 59 | Admitting: Family Medicine

## 2022-04-21 ENCOUNTER — Other Ambulatory Visit (HOSPITAL_BASED_OUTPATIENT_CLINIC_OR_DEPARTMENT_OTHER): Payer: Self-pay

## 2022-04-21 ENCOUNTER — Other Ambulatory Visit (HOSPITAL_COMMUNITY): Payer: Self-pay

## 2022-04-21 ENCOUNTER — Ambulatory Visit (INDEPENDENT_AMBULATORY_CARE_PROVIDER_SITE_OTHER): Payer: 59 | Admitting: Orthopaedic Surgery

## 2022-04-21 VITALS — BP 128/80 | HR 88 | Temp 97.7°F | Resp 17 | Ht 62.0 in | Wt 196.4 lb

## 2022-04-21 DIAGNOSIS — M75121 Complete rotator cuff tear or rupture of right shoulder, not specified as traumatic: Secondary | ICD-10-CM | POA: Diagnosis not present

## 2022-04-21 DIAGNOSIS — M722 Plantar fascial fibromatosis: Secondary | ICD-10-CM | POA: Diagnosis not present

## 2022-04-21 DIAGNOSIS — E669 Obesity, unspecified: Secondary | ICD-10-CM

## 2022-04-21 LAB — BASIC METABOLIC PANEL
BUN: 13 mg/dL (ref 6–23)
CO2: 28 mEq/L (ref 19–32)
Calcium: 9.1 mg/dL (ref 8.4–10.5)
Chloride: 106 mEq/L (ref 96–112)
Creatinine, Ser: 0.85 mg/dL (ref 0.40–1.20)
GFR: 75.27 mL/min (ref >60.00)
Glucose, Bld: 98 mg/dL (ref 70–99)
Potassium: 3.9 mEq/L (ref 3.5–5.1)
Sodium: 141 mEq/L (ref 135–145)

## 2022-04-21 MED ORDER — MELOXICAM 15 MG PO TABS
15.0000 mg | ORAL_TABLET | Freq: Every day | ORAL | 0 refills | Status: DC
  Filled 2022-04-21: qty 30, 30d supply, fill #0

## 2022-04-21 NOTE — Assessment & Plan Note (Signed)
Ongoing issue.  Pt has gained 6 lbs since last visit despite starting Saxenda.  She has titrated dose to 30m daily.  She is exercising regularly but not as many minutes as previously since she's back to work.  Encouraged low carb diet.  Check BMP due GLP1 use.  Will continue to follow.

## 2022-04-21 NOTE — Progress Notes (Signed)
   Subjective:    Patient ID: Katelyn Reyes, female    DOB: 1960/10/28, 61 y.o.   MRN: [id number]  HPI Obesity- pt reports Kirke Shaggy is not nearly as effective as the Ozempic.  It doesn't control the cravings in the same way.  Pt admits cravings may be stress related.  Denies nausea or vomiting.  Is up to the 84m daily dose.  Pt is exercising regularly but not for as long.  No CP, SOB, HA's  R heel pain- feels similar to previous episode of plantar fasciitis.  Took mom's Meloxicam w/ relief.  Sxs started last week on Wednesday/Thursday.  Wearing New Balance sneakers to work.     Review of Systems For ROS see HPI     Objective:   Physical Exam Vitals reviewed.  Constitutional:      General: She is not in acute distress.    Appearance: Normal appearance. She is obese.  HENT:     Head: Normocephalic and atraumatic.  Cardiovascular:     Rate and Rhythm: Normal rate and regular rhythm.     Pulses: Normal pulses.  Pulmonary:     Effort: Pulmonary effort is normal. No respiratory distress.     Breath sounds: Normal breath sounds. No wheezing or rhonchi.  Musculoskeletal:        General: Tenderness (TTP over R longitudinal arch) present.     Cervical back: Normal range of motion and neck supple.     Right lower leg: No edema.     Left lower leg: No edema.  Skin:    General: Skin is warm and dry.  Neurological:     General: No focal deficit present.     Mental Status: She is alert and oriented to person, place, and time.  Psychiatric:        Mood and Affect: Mood normal.        Behavior: Behavior normal.           Assessment & Plan:  Plantar fasciitis- pt's sxs are consistent w/ dx.  Improved w/ use of mom's Meloxicam.  Prescription provided.  Reviewed stretching, icing, and need for arch support.  Pt expressed understanding and is in agreement w/ plan.

## 2022-04-21 NOTE — Progress Notes (Signed)
Post Operative Evaluation    Procedure/Date of Surgery: Right shoulder rotator cuff repair with biceps tenodesis August 29, 2021  Interval History:   Presents today for follow-up at 7 months overall doing extremely well.  At this time she has discontinued physical therapy she is hoping to get back to work full-time.  She does have some occasional muscle spasm but overall continues to improve.  PMH/PSH/Family History/Social History/Meds/Allergies:    Past Medical History:  Diagnosis Date   Allergic rhinitis    Anxiety    Arthritis    Asthma    related to seasonal allergies   Breast calcification, right 02/02/2013   Surgically excised 02/17/13 > path benign    Cancer ([place])    Depression    Family history of breast cancer 04/12/2021   Family history of ovarian cancer 04/12/2021   GERD (gastroesophageal reflux disease)    Hyperlipidemia    IUD 02/07/2010   removed   Rotator cuff tear    right   Past Surgical History:  Procedure Laterality Date   BREAST BIOPSY Right 02/17/2013   Procedure:  NEEDLE LOCALIZATION REMOVAL RIGHT BREAST CALCIFICATIONS;  Surgeon: Haywood Lasso, MD;  Location: [place];  Service: General;  Laterality: Right;   BREAST EXCISIONAL BIOPSY Right pt unsure   benign   BREAST RECONSTRUCTION WITH PLACEMENT OF TISSUE EXPANDER AND ALLODERM Bilateral 04/26/2021   Procedure: BREAST RECONSTRUCTION WITH PLACEMENT OF TISSUE EXPANDER AND ALLODERM;  Surgeon: Irene Limbo, MD;  Location: [place];  Service: Plastics;  Laterality: Bilateral;   BURCH PROCEDURE N/A 12/22/2013   Procedure: BURCH CYSTO URETHROPEXY, ANTERIOR AND POSTERIOR CULPORRHAPHY;  Surgeon: Ena Dawley, MD;  Location: [facility];  Service: Gynecology;  Laterality: N/A;   CERVICAL DISC ARTHROPLASTY N/A 11/12/2017   Procedure: ARTIFICIAL DISC REPLACEMENT CERVICAL SIX-SEVEN;  Surgeon: Ashok Pall, MD;  Location: [place];  Service:  Neurosurgery;  Laterality: N/A;  ARTIFICIAL DISC REPLACEMENT CERVICAL 6- CERVICAL 7   CHOLECYSTECTOMY N/A 10/09/2017   Procedure: LAPAROSCOPIC CHOLECYSTECTOMY WITH INTRAOPERATIVE CHOLANGIOGRAM;  Surgeon: Judeth Horn, MD;  Location: [place];  Service: General;  Laterality: N/A;   KNEE ARTHROSCOPY WITH MEDIAL MENISECTOMY Right 05/01/2020   Procedure: KNEE ARTHROSCOPY WITH PARTIAL MEDIAL MENISECTOMY,   PATELLA  CHONDROPLASTY;  Surgeon: Paralee Cancel, MD;  Location: [facility];  Service: Orthopedics;  Laterality: Right;   LIPOSUCTION WITH LIPOFILLING Bilateral 07/23/2021   Procedure: LIPOFILLING FROM ABDOMEN TO BILATERAL CHEST;  Surgeon: Irene Limbo, MD;  Location: [place];  Service: Plastics;  Laterality: Bilateral;   MASTECTOMY W/ SENTINEL NODE BIOPSY Right 04/26/2021   Procedure: RIGHT MASTECTOMY WITH RIGHT SENTINEL LYMPH NODE BIOPSY;  Surgeon: [name], MD;  Location: [place];  Service: General;  Laterality: Right;   MOUTH SURGERY     NASAL SINUS SURGERY     REMOVAL OF BILATERAL TISSUE EXPANDERS WITH PLACEMENT OF BILATERAL BREAST IMPLANTS Bilateral 07/23/2021   Procedure: REMOVAL OF BILATERAL TISSUE EXPANDERS WITH PLACEMENT OF BILATERAL BREAST SILICONE IMPLANTS;  Surgeon: Irene Limbo, MD;  Location: [place];  Service: Plastics;  Laterality: Bilateral;   SHOULDER ARTHROSCOPY WITH ROTATOR CUFF REPAIR AND OPEN BICEPS TENODESIS Right 08/29/2021   Procedure: RIGHT SHOULDER ARTHROSCOPY WITH ROTATOR CUFF REPAIR WITH PATCH AUGMENTATION AND  BICEPS TENODESIS;  Surgeon: Vanetta Mulders,  MD;  Location: [place];  Service: Orthopedics;  Laterality: Right;   TOTAL MASTECTOMY Left 04/26/2021   Procedure: LEFT TOTAL MASTECTOMY;  Surgeon: [name], MD;  Location: [place];  Service: General;  Laterality: Left;   Social History   Socioeconomic History   Marital status: Single    Spouse  name: Not on file   Number of children: Not on file   Years of education: Not on file   Highest education level: Not on file  Occupational History   Not on file  Tobacco Use   Smoking status: Never   Smokeless tobacco: Never  Vaping Use   Vaping Use: Never used  Substance and Sexual Activity   Alcohol use: Yes    Comment: social   Drug use: No   Sexual activity: Yes    Birth control/protection: Post-menopausal  Other Topics Concern   Not on file  Social History Narrative   Not on file   Social Determinants of Health   Financial Resource Strain: Not on file  Food Insecurity: Not on file  Transportation Needs: Not on file  Physical Activity: Not on file  Stress: Not on file  Social Connections: Not on file   Family History  Problem Relation Age of Onset   Fibrocystic breast disease Mother    Hypertension Father    Diabetes Father    Breast cancer Maternal Aunt        dx 50s   Colon cancer Paternal Aunt        x2 pat aunts, dx after 1   Stroke Maternal Grandmother    Cancer Other        ovarian/GYN; MGF's mother   Colon cancer Other        MGM's father; dx after 33   Coronary artery disease Neg Hx    Allergies  Allergen Reactions   Amoxicillin Rash and Other (See Comments)    Has patient had a PCN reaction causing immediate rash, facial/tongue/throat swelling, SOB or lightheadedness with hypotension: No Has patient had a PCN reaction causing severe rash involving mucus membranes or skin necrosis: No Has patient had a PCN reaction that required treatment: #  #  #  YES  #  #  # - MD office Has patient had a PCN reaction occurring within the last 10 years: Yes If all of the above answers are "NO", then may proceed with Cephalosporin use.    Erythromycin Nausea Only   Hydrocodone-Acetaminophen Other (See Comments)    Hallucinations   Penicillin G Rash   Shellfish Allergy Itching and Other (See Comments)    REACTS TO SCALLOPS    Current Outpatient Medications   Medication Sig Dispense Refill   albuterol (VENTOLIN HFA) 108 (90 Base) MCG/ACT inhaler Inhale 2 puffs into the lungs every 6 (six) hours as needed for wheezing. (Patient not taking: Reported on 04/04/2022) 18 g 6   ALPRAZolam (XANAX) 0.5 MG tablet Take 1 tablet (0.5 mg total) by mouth 3 (three) times daily as needed. 60 tablet 3   anastrozole (ARIMIDEX) 1 MG tablet Take 1 tablet (1 mg total) by mouth daily. 90 tablet 12   cetirizine (ZYRTEC) 10 MG tablet Take 1 tablet (10 mg total) by mouth daily. 90 tablet 1   Cholecalciferol (VITAMIN D-3) 125 MCG (5000 UT) TABS Take 5,000 Units by mouth every evening.     COVID-19 mRNA vaccine 2023-2024 (COMIRNATY) syringe Inject into the muscle. 0.3 mL 0   fluticasone (FLONASE) 50  MCG/ACT nasal spray Place 2 sprays into both nostrils daily. 16 g 6   gabapentin (NEURONTIN) 300 MG capsule Take 2 capsules (600 mg total) by mouth at bedtime. 60 capsule 4   Insulin Pen Needle 32G X 4 MM MISC Use daily w/ saxenda 100 each 3   Liraglutide -Weight Management (SAXENDA) 18 MG/3ML SOPN Inject 0.6 mg into the skin daily for 7 days, THEN 1.2 mg daily for 7 days, THEN 1.8 mg daily for 7 days, THEN 2.4 mg daily for 7 days, THEN 3 mg daily. 15 mL 0   Liraglutide -Weight Management (SAXENDA) 18 MG/3ML SOPN Inject 3 mg into the skin daily. 15 mL 3   montelukast (SINGULAIR) 10 MG tablet Take 1 tablet (10 mg total) by mouth daily. 90 tablet 1   pantoprazole (PROTONIX) 40 MG tablet Take 1 tablet (40 mg total) by mouth 2 (two) times daily. 180 tablet 1   sertraline (ZOLOFT) 100 MG tablet Take 1 tablet (100 mg total) by mouth daily. 90 tablet 1   zolpidem (AMBIEN) 10 MG tablet Take 1 tablet (10 mg total) by mouth at bedtime as needed for sleep. 30 tablet 3   No current facility-administered medications for this visit.   No results found.  Review of Systems:   A ROS was performed including pertinent positives and negatives as documented in the HPI.   Musculoskeletal Exam:     There were no vitals taken for this visit.  Skin incisions are well-healed.  Active forward elevation on both sides is to 170 and equal .  External rotation at the side is to 60 degrees without difficulty she able flex and extend at the right elbow and this is equal to the contralateral side.  Internal rotation is to T12 bilaterally.  Strength is near equal to the contralateral side.  She able to fire EPL as well as wrist extensors 2+ radial pulse  Imaging:    None  I personally reviewed and interpreted the radiographs.   Assessment:   61 year old female who is 7 months status post rotator cuff repair and biceps tenodesis.  Overall she is doing very well at today's visit.  At this time I have lifted all restrictions and she will return to work full-time Plan :    -Return to clinic as needed     I personally saw and evaluated the patient, and participated in the management and treatment plan.  Vanetta Mulders, MD Attending Physician, Orthopedic Surgery  This document was dictated using Dragon voice recognition software. A reasonable attempt at proof reading has been made to minimize errors.

## 2022-04-21 NOTE — Patient Instructions (Signed)
Schedule your complete physical in Feb We'll notify you of your lab results and make any changes if needed Continue to work on healthy diet and regular exercise- you can do it! START the Meloxicam daily- w/ food- as needed for foot pain Make sure you have good arch support STRETCH! ICE! Call with any questions or concerns Hang in there!!!

## 2022-04-22 ENCOUNTER — Telehealth: Payer: Self-pay

## 2022-04-22 NOTE — Telephone Encounter (Signed)
-----   Message from Midge Minium, MD sent at 04/22/2022  7:21 AM EST ----- Labs look great!  No changes at this time

## 2022-04-22 NOTE — Telephone Encounter (Signed)
Informed pt of lab results

## 2022-04-30 ENCOUNTER — Other Ambulatory Visit (HOSPITAL_COMMUNITY): Payer: Self-pay

## 2022-05-01 DIAGNOSIS — Z421 Encounter for breast reconstruction following mastectomy: Secondary | ICD-10-CM | POA: Diagnosis not present

## 2022-05-09 ENCOUNTER — Other Ambulatory Visit (HOSPITAL_BASED_OUTPATIENT_CLINIC_OR_DEPARTMENT_OTHER): Payer: Self-pay

## 2022-05-15 DIAGNOSIS — Z421 Encounter for breast reconstruction following mastectomy: Secondary | ICD-10-CM | POA: Diagnosis not present

## 2022-05-20 ENCOUNTER — Other Ambulatory Visit (HOSPITAL_COMMUNITY): Payer: Self-pay

## 2022-05-27 ENCOUNTER — Other Ambulatory Visit (HOSPITAL_BASED_OUTPATIENT_CLINIC_OR_DEPARTMENT_OTHER): Payer: Self-pay

## 2022-05-28 ENCOUNTER — Ambulatory Visit (INDEPENDENT_AMBULATORY_CARE_PROVIDER_SITE_OTHER): Payer: 59 | Admitting: Podiatry

## 2022-05-28 ENCOUNTER — Encounter: Payer: Self-pay | Admitting: Podiatry

## 2022-05-28 ENCOUNTER — Ambulatory Visit (INDEPENDENT_AMBULATORY_CARE_PROVIDER_SITE_OTHER): Payer: 59

## 2022-05-28 VITALS — BP 143/89

## 2022-05-28 DIAGNOSIS — M722 Plantar fascial fibromatosis: Secondary | ICD-10-CM

## 2022-05-28 MED ORDER — TRIAMCINOLONE ACETONIDE 10 MG/ML IJ SUSP
10.0000 mg | Freq: Once | INTRAMUSCULAR | Status: AC
  Administered 2022-05-28: 10 mg

## 2022-05-28 NOTE — Patient Instructions (Signed)

## 2022-05-29 NOTE — Progress Notes (Signed)
Subjective:   Patient ID: Katelyn Reyes, female   DOB: 61 y.o.   MRN: [id number]   HPI Patient presents stating she is having a lot of pain in her right heel for the last 2+ months.  States that sharp throbbing pains that is getting worse and it is worse with standing or after periods of sitting and getting up.  Patient does not smoke likes to be active   Review of Systems  All other systems reviewed and are negative.       Objective:  Physical Exam Vitals and nursing note reviewed.  Constitutional:      Appearance: She is well-developed.  Pulmonary:     Effort: Pulmonary effort is normal.  Musculoskeletal:        General: Normal range of motion.  Skin:    General: Skin is warm.  Neurological:     Mental Status: She is alert.     Neurovascular status intact muscle strength found to be adequate range of motion adequate.  Patient is noted to have exquisite discomfort right plantar fascia at the insertional point of the tendon into the calcaneus inflammation fluid buildup with moderate depression of the arch.  Patient has good digital perfusion well-oriented x 3     Assessment:  Acute plantar fasciitis right with inflammation fluid of the medial band     Plan:  H&P reviewed condition and x-rays.  Went ahead did sterile prep and injected the plantar fascial insertion into the calcaneus 3 mg Kenalog 5 mg Xylocaine and applied fascial brace to lift up the arch.  Discussed long-term orthotics we will look at this again in 2 weeks and make that decision  X-rays indicate significant depression of the arch with patient found to have moderate plantar spur no indication of stress fracture

## 2022-06-02 ENCOUNTER — Telehealth: Payer: Self-pay

## 2022-06-02 ENCOUNTER — Other Ambulatory Visit (HOSPITAL_COMMUNITY): Payer: Self-pay

## 2022-06-02 NOTE — Telephone Encounter (Signed)
Pharmacy Patient Advocate Encounter   Received notification from [employer] that prior authorization for Saxenda 18MG/3ML pen-injectors is required/requested.   PA submitted on 06/02/22 to (ins) [employer] via CoverMyMeds Key [id number]  Status is pending

## 2022-06-04 ENCOUNTER — Ambulatory Visit: Payer: 59 | Admitting: Podiatry

## 2022-06-10 ENCOUNTER — Other Ambulatory Visit (HOSPITAL_BASED_OUTPATIENT_CLINIC_OR_DEPARTMENT_OTHER): Payer: Self-pay

## 2022-06-10 NOTE — Telephone Encounter (Signed)
Pharmacy Patient Advocate Encounter  Prior Authorization for Katelyn Reyes has been approved.    PA# [id number] Effective dates: 06/10/2022 through 07/22/2022

## 2022-06-11 ENCOUNTER — Other Ambulatory Visit (HOSPITAL_BASED_OUTPATIENT_CLINIC_OR_DEPARTMENT_OTHER): Payer: Self-pay

## 2022-06-12 ENCOUNTER — Ambulatory Visit: Payer: 59 | Admitting: Podiatry

## 2022-06-12 ENCOUNTER — Encounter: Payer: Self-pay | Admitting: Podiatry

## 2022-06-12 ENCOUNTER — Other Ambulatory Visit (HOSPITAL_BASED_OUTPATIENT_CLINIC_OR_DEPARTMENT_OTHER): Payer: Self-pay

## 2022-06-12 DIAGNOSIS — M76821 Posterior tibial tendinitis, right leg: Secondary | ICD-10-CM | POA: Diagnosis not present

## 2022-06-12 DIAGNOSIS — M722 Plantar fascial fibromatosis: Secondary | ICD-10-CM

## 2022-06-12 NOTE — Progress Notes (Signed)
Subjective:   Patient ID: Katelyn Reyes, female   DOB: 61 y.o.   MRN: [id number]   HPI Patient states she has had some improvement in the bottom of the heel but seems to have more pain down the side of the foot and into the arch.  States that her foot is flat which is complicating factor   ROS      Objective:  Physical Exam  Vascular status intact with inflammation now more in the posterior tibial tendon right as it inserts into the navicular with plantar fascial inflammation reduced with significant flatfoot deformity contributory to the problems     Assessment:  Posterior tibial tendinitis right with inflammation secondary to foot structure along with Planter fasciitis improving     Plan:  H&P reviewed both conditions went ahead today and did sterile prep and did sheath injection of the posterior tib tendon right 3 mg dexamethasone Kenalog 5 mg Xylocaine and went ahead and instructed on continued brace usage and casted for functional orthotics to lift up the arches and take pressure off the feet in general.  Reappoint to recheck when orthotics return I explained may require MRI or casting if symptoms persist

## 2022-06-20 ENCOUNTER — Other Ambulatory Visit (HOSPITAL_COMMUNITY): Payer: Self-pay

## 2022-06-20 ENCOUNTER — Other Ambulatory Visit: Payer: Self-pay | Admitting: Hematology & Oncology

## 2022-06-20 ENCOUNTER — Other Ambulatory Visit: Payer: Self-pay | Admitting: Family

## 2022-06-21 MED ORDER — GABAPENTIN 300 MG PO CAPS
600.0000 mg | ORAL_CAPSULE | Freq: Every day | ORAL | 4 refills | Status: DC
  Filled 2022-06-21: qty 60, 30d supply, fill #0
  Filled 2022-07-21: qty 60, 30d supply, fill #1
  Filled 2022-08-27: qty 60, 30d supply, fill #2
  Filled 2022-10-03: qty 60, 30d supply, fill #3
  Filled 2022-10-29: qty 60, 30d supply, fill #4

## 2022-06-23 ENCOUNTER — Other Ambulatory Visit (HOSPITAL_COMMUNITY): Payer: Self-pay

## 2022-06-24 ENCOUNTER — Other Ambulatory Visit: Payer: Self-pay | Admitting: Family Medicine

## 2022-06-24 ENCOUNTER — Other Ambulatory Visit (HOSPITAL_COMMUNITY): Payer: Self-pay

## 2022-06-24 NOTE — Telephone Encounter (Signed)
Ambien 10 mg LOV: 04/21/22 Last Refill:12/25/21 Upcoming appt: 07/21/22

## 2022-06-25 ENCOUNTER — Other Ambulatory Visit (HOSPITAL_COMMUNITY): Payer: Self-pay

## 2022-06-25 MED ORDER — ZOLPIDEM TARTRATE 10 MG PO TABS
10.0000 mg | ORAL_TABLET | Freq: Every evening | ORAL | 3 refills | Status: DC | PRN
  Filled 2022-06-25: qty 30, 30d supply, fill #0

## 2022-06-25 NOTE — Telephone Encounter (Signed)
Notified pt that her Rx has been sent in

## 2022-07-04 ENCOUNTER — Ambulatory Visit: Payer: 59 | Admitting: Hematology & Oncology

## 2022-07-04 ENCOUNTER — Inpatient Hospital Stay: Payer: Commercial Managed Care - PPO

## 2022-07-07 ENCOUNTER — Inpatient Hospital Stay: Payer: Commercial Managed Care - PPO | Admitting: Hematology & Oncology

## 2022-07-07 ENCOUNTER — Encounter: Payer: Self-pay | Admitting: Hematology & Oncology

## 2022-07-07 ENCOUNTER — Other Ambulatory Visit: Payer: Self-pay

## 2022-07-07 ENCOUNTER — Inpatient Hospital Stay: Payer: Commercial Managed Care - PPO | Attending: Hematology & Oncology

## 2022-07-07 VITALS — BP 109/69 | HR 71 | Temp 97.8°F | Resp 16 | Wt 193.0 lb

## 2022-07-07 DIAGNOSIS — C50911 Malignant neoplasm of unspecified site of right female breast: Secondary | ICD-10-CM | POA: Diagnosis not present

## 2022-07-07 DIAGNOSIS — Z9013 Acquired absence of bilateral breasts and nipples: Secondary | ICD-10-CM | POA: Insufficient documentation

## 2022-07-07 DIAGNOSIS — C50411 Malignant neoplasm of upper-outer quadrant of right female breast: Secondary | ICD-10-CM

## 2022-07-07 DIAGNOSIS — Z0000 Encounter for general adult medical examination without abnormal findings: Secondary | ICD-10-CM | POA: Diagnosis not present

## 2022-07-07 DIAGNOSIS — Z79811 Long term (current) use of aromatase inhibitors: Secondary | ICD-10-CM | POA: Insufficient documentation

## 2022-07-07 DIAGNOSIS — Z79899 Other long term (current) drug therapy: Secondary | ICD-10-CM | POA: Insufficient documentation

## 2022-07-07 DIAGNOSIS — Z170 Estrogen receptor positive status [ER+]: Secondary | ICD-10-CM | POA: Insufficient documentation

## 2022-07-07 LAB — CMP (CANCER CENTER ONLY)
ALT: 19 U/L (ref 0–44)
AST: 15 U/L (ref 15–41)
Albumin: 4.1 g/dL (ref 3.5–5.0)
Alkaline Phosphatase: 81 U/L (ref 38–126)
Anion gap: 8 (ref 5–15)
BUN: 18 mg/dL (ref 6–20)
CO2: 29 mmol/L (ref 22–32)
Calcium: 9.8 mg/dL (ref 8.9–10.3)
Chloride: 107 mmol/L (ref 98–111)
Creatinine: 0.95 mg/dL (ref 0.44–1.00)
GFR, Estimated: >60 mL/min (ref >60)
Glucose, Bld: 122 mg/dL — ABNORMAL HIGH (ref 70–99)
Potassium: 4.4 mmol/L (ref 3.5–5.1)
Sodium: 144 mmol/L (ref 135–145)
Total Bilirubin: 0.3 mg/dL (ref 0.3–1.2)
Total Protein: 6.9 g/dL (ref 6.5–8.1)

## 2022-07-07 LAB — CBC WITH DIFFERENTIAL (CANCER CENTER ONLY)
Abs Immature Granulocytes: 0.01 10*3/uL (ref 0.00–0.07)
Basophils Absolute: 0 10*3/uL (ref 0.0–0.1)
Basophils Relative: 0 %
Eosinophils Absolute: 0.4 10*3/uL (ref 0.0–0.5)
Eosinophils Relative: 5 %
HCT: 37.5 % (ref 36.0–46.0)
Hemoglobin: 12.4 g/dL (ref 12.0–15.0)
Immature Granulocytes: 0 %
Lymphocytes Relative: 42 %
Lymphs Abs: 3 10*3/uL (ref 0.7–4.0)
MCH: 32 pg (ref 26.0–34.0)
MCHC: 33.1 g/dL (ref 30.0–36.0)
MCV: 96.9 fL (ref 80.0–100.0)
Monocytes Absolute: 0.4 10*3/uL (ref 0.1–1.0)
Monocytes Relative: 6 %
Neutro Abs: 3.2 10*3/uL (ref 1.7–7.7)
Neutrophils Relative %: 47 %
Platelet Count: 256 10*3/uL (ref 150–400)
RBC: 3.87 MIL/uL (ref 3.87–5.11)
RDW: 12 % (ref 11.5–15.5)
WBC Count: 7 10*3/uL (ref 4.0–10.5)
nRBC: 0 % (ref 0.0–0.2)

## 2022-07-07 LAB — LACTATE DEHYDROGENASE: LDH: 199 U/L — ABNORMAL HIGH (ref 98–192)

## 2022-07-07 NOTE — Progress Notes (Signed)
Hematology and Oncology Follow Up Visit  Katelyn Reyes [id number] Dec 23, 1960 62 y.o. 07/07/2022   Principle Diagnosis:  Stage IA (T1aN0M0) infiltrating ductal carcinoma of the right breast- ER+/PR+/HER2-  Current Therapy:   Status post bilateral mastectomies --04/26/2021 Arimidex 1 mg p.o. daily --start on 06/29/2021     Interim History:  Katelyn Reyes is back for follow-up.  She is doing quite well.  We last saw her back in October.  She had a wonderful Holiday season.  She is with her family.  She still is going to school.  She finishes up in August.  She was working this weekend.  She is always quite busy.  She has had better luck with a hot flashes.  The gabapentin seems to be working quite well.  She has had no fever.  There is been no cough or shortness of breath.  She has had no change in bowel or bladder habits.  She has had no rashes.  There is been no bleeding.  There is been no problems with lymphedema of the right arm.  Overall, I would say that her performance status is probably ECOG 0.   Medications:  Current Outpatient Medications:    albuterol (VENTOLIN HFA) 108 (90 Base) MCG/ACT inhaler, Inhale 2 puffs into the lungs every 6 (six) hours as needed for wheezing., Disp: 18 g, Rfl: 6   ALPRAZolam (XANAX) 0.5 MG tablet, Take 1 tablet (0.5 mg total) by mouth 3 (three) times daily as needed., Disp: 60 tablet, Rfl: 3   anastrozole (ARIMIDEX) 1 MG tablet, Take 1 tablet (1 mg total) by mouth daily., Disp: 90 tablet, Rfl: 12   cetirizine (ZYRTEC) 10 MG tablet, Take 1 tablet (10 mg total) by mouth daily., Disp: 90 tablet, Rfl: 1   Cholecalciferol (VITAMIN D-3) 125 MCG (5000 UT) TABS, Take 5,000 Units by mouth every evening., Disp: , Rfl:    fluticasone (FLONASE) 50 MCG/ACT nasal spray, Place 2 sprays into both nostrils daily., Disp: 16 g, Rfl: 6   gabapentin (NEURONTIN) 300 MG capsule, Take 2 capsules (600 mg total) by mouth at bedtime., Disp: 60 capsule, Rfl: 4   Insulin  Pen Needle 32G X 4 MM MISC, Use daily w/ saxenda, Disp: 100 each, Rfl: 3   Liraglutide -Weight Management (SAXENDA) 18 MG/3ML SOPN, Inject 3 mg into the skin daily., Disp: 15 mL, Rfl: 3   meloxicam (MOBIC) 15 MG tablet, Take 1 tablet (15 mg total) by mouth daily., Disp: 30 tablet, Rfl: 0   montelukast (SINGULAIR) 10 MG tablet, Take 1 tablet (10 mg total) by mouth daily., Disp: 90 tablet, Rfl: 1   pantoprazole (PROTONIX) 40 MG tablet, Take 1 tablet (40 mg total) by mouth 2 (two) times daily., Disp: 180 tablet, Rfl: 1   sertraline (ZOLOFT) 100 MG tablet, Take 1 tablet (100 mg total) by mouth daily., Disp: 90 tablet, Rfl: 1   zolpidem (AMBIEN) 10 MG tablet, Take 1 tablet (10 mg total) by mouth at bedtime as needed for sleep., Disp: 30 tablet, Rfl: 3  Allergies:  Allergies  Allergen Reactions   Amoxicillin Rash and Other (See Comments)    Has patient had a PCN reaction causing immediate rash, facial/tongue/throat swelling, SOB or lightheadedness with hypotension: No Has patient had a PCN reaction causing severe rash involving mucus membranes or skin necrosis: No Has patient had a PCN reaction that required treatment: #  #  #  YES  #  #  # - MD office Has patient had a PCN  reaction occurring within the last 10 years: Yes If all of the above answers are "NO", then may proceed with Cephalosporin use.    Erythromycin Nausea Only   Hydrocodone-Acetaminophen Other (See Comments)    Hallucinations   Penicillin G Rash   Shellfish Allergy Itching and Other (See Comments)    REACTS TO SCALLOPS     Past Medical History, Surgical history, Social history, and Family History were reviewed and updated.  Review of Systems: Review of Systems  Constitutional:  Positive for appetite change.  HENT:  Negative.    Eyes: Negative.   Respiratory: Negative.    Cardiovascular: Negative.   Gastrointestinal:  Positive for diarrhea and vomiting.  Endocrine: Negative.   Genitourinary: Negative.     Musculoskeletal: Negative.   Skin: Negative.   Neurological: Negative.   Hematological: Negative.   Psychiatric/Behavioral: Negative.      Physical Exam:  weight is 193 lb (87.5 kg). Her oral temperature is 97.8 F (36.6 C). Her blood pressure is 109/69 and her pulse is 71. Her respiration is 16 and oxygen saturation is 98%.   Wt Readings from Last 3 Encounters:  07/07/22 193 lb (87.5 kg)  04/21/22 196 lb 6 oz (89.1 kg)  04/04/22 190 lb 6.4 oz (86.4 kg)    Physical Exam Vitals reviewed.  Constitutional:      Comments: Breast exam shows status post mastectomies.  She has implants.  Everything looks to be healing nicely.  There is no erythema.  There is no exudate.  The wounds are intact.  There is no bilateral axillary adenopathy.    HENT:     Head: Normocephalic and atraumatic.  Eyes:     Pupils: Pupils are equal, round, and reactive to light.  Cardiovascular:     Rate and Rhythm: Normal rate and regular rhythm.     Heart sounds: Normal heart sounds.  Pulmonary:     Effort: Pulmonary effort is normal.     Breath sounds: Normal breath sounds.  Abdominal:     General: Bowel sounds are normal.     Palpations: Abdomen is soft.     Comments: Abdominal exam is soft.  There is no distention.  There is some tenderness to palpation.  She has no fluid wave.  Bowel sounds are present.  There is no palpable liver or spleen tip.  Musculoskeletal:        General: No tenderness or deformity. Normal range of motion.     Cervical back: Normal range of motion.     Comments: Extremities shows the right shoulder with the arthroscopic scars.    Lymphadenopathy:     Cervical: No cervical adenopathy.  Skin:    General: Skin is warm and dry.     Findings: No erythema or rash.  Neurological:     Mental Status: She is alert and oriented to person, place, and time.  Psychiatric:        Behavior: Behavior normal.        Thought Content: Thought content normal.        Judgment: Judgment normal.       Lab Results  Component Value Date   WBC 7.0 07/07/2022   HGB 12.4 07/07/2022   HCT 37.5 07/07/2022   MCV 96.9 07/07/2022   PLT 256 07/07/2022     Chemistry      Component Value Date/Time   NA 141 04/21/2022 1141   K 3.9 04/21/2022 1141   CL 106 04/21/2022 1141   CO2 28 04/21/2022  1141   BUN 13 04/21/2022 1141   CREATININE 0.85 04/21/2022 1141   CREATININE 0.92 04/04/2022 0743   CREATININE 0.92 04/28/2017 1645      Component Value Date/Time   CALCIUM 9.1 04/21/2022 1141   ALKPHOS 74 04/04/2022 0743   AST 24 04/04/2022 0743   ALT 21 04/04/2022 0743   BILITOT 0.4 04/04/2022 0743      Impression and Plan: Ms. Fein is a very charming 62 year old African-American female.  She is postmenopausal.  She has a very early stage ductal carcinoma of the right breast.  Again, I do suspect that with surgery alone, her chance of cure is probably 90%.  We will continue the Arimidex.    I told her that we do not have to do any kind of scans unless we see any problems with her labs with anything on her physical exam or if she has any new symptoms.  We will now plan to get her back in 4 months.  This will be in the Spring.  I know that she is looking forward to getting done with school.  I know that her new puppy is probably about 60 years old now.  I know she is enjoying him.   Volanda Napoleon, MD 1/22/20248:07 AM

## 2022-07-08 ENCOUNTER — Encounter: Payer: Self-pay | Admitting: Hematology & Oncology

## 2022-07-09 LAB — QUANTIFERON-TB GOLD PLUS: QuantiFERON-TB Gold Plus: NEGATIVE

## 2022-07-09 LAB — QUANTIFERON-TB GOLD PLUS (RQFGPL)
QuantiFERON Mitogen Value: >10 IU/mL
QuantiFERON Nil Value: 0 IU/mL
QuantiFERON TB1 Ag Value: 0 IU/mL
QuantiFERON TB2 Ag Value: 0 IU/mL

## 2022-07-13 ENCOUNTER — Encounter: Payer: Self-pay | Admitting: Hematology & Oncology

## 2022-07-17 ENCOUNTER — Ambulatory Visit (INDEPENDENT_AMBULATORY_CARE_PROVIDER_SITE_OTHER): Payer: Commercial Managed Care - PPO | Admitting: Podiatry

## 2022-07-17 DIAGNOSIS — M722 Plantar fascial fibromatosis: Secondary | ICD-10-CM

## 2022-07-17 DIAGNOSIS — M76821 Posterior tibial tendinitis, right leg: Secondary | ICD-10-CM

## 2022-07-21 ENCOUNTER — Encounter: Payer: Self-pay | Admitting: Family Medicine

## 2022-07-21 ENCOUNTER — Ambulatory Visit (INDEPENDENT_AMBULATORY_CARE_PROVIDER_SITE_OTHER): Payer: Commercial Managed Care - PPO | Admitting: Family Medicine

## 2022-07-21 ENCOUNTER — Other Ambulatory Visit (HOSPITAL_COMMUNITY): Payer: Self-pay

## 2022-07-21 ENCOUNTER — Encounter: Payer: Self-pay | Admitting: Hematology & Oncology

## 2022-07-21 VITALS — BP 130/76 | HR 81 | Temp 98.4°F | Resp 16 | Ht 62.0 in | Wt 196.0 lb

## 2022-07-21 DIAGNOSIS — Z0000 Encounter for general adult medical examination without abnormal findings: Secondary | ICD-10-CM

## 2022-07-21 DIAGNOSIS — E669 Obesity, unspecified: Secondary | ICD-10-CM

## 2022-07-21 DIAGNOSIS — E559 Vitamin D deficiency, unspecified: Secondary | ICD-10-CM | POA: Diagnosis not present

## 2022-07-21 LAB — HEPATIC FUNCTION PANEL
ALT: 23 U/L (ref 0–35)
AST: 22 U/L (ref 0–37)
Albumin: 4.2 g/dL (ref 3.5–5.2)
Alkaline Phosphatase: 76 U/L (ref 39–117)
Bilirubin, Direct: 0.1 mg/dL (ref 0.0–0.3)
Total Bilirubin: 0.4 mg/dL (ref 0.2–1.2)
Total Protein: 7.5 g/dL (ref 6.0–8.3)

## 2022-07-21 LAB — CBC WITH DIFFERENTIAL/PLATELET
Basophils Absolute: 0 10*3/uL (ref 0.0–0.1)
Basophils Relative: 0.7 % (ref 0.0–3.0)
Eosinophils Absolute: 0.2 10*3/uL (ref 0.0–0.7)
Eosinophils Relative: 3.9 % (ref 0.0–5.0)
HCT: 38.1 % (ref 36.0–46.0)
Hemoglobin: 12.9 g/dL (ref 12.0–15.0)
Lymphocytes Relative: 46 % (ref 12.0–46.0)
Lymphs Abs: 2.8 10*3/uL (ref 0.7–4.0)
MCHC: 33.8 g/dL (ref 30.0–36.0)
MCV: 95 fl (ref 78.0–100.0)
Monocytes Absolute: 0.5 10*3/uL (ref 0.1–1.0)
Monocytes Relative: 7.3 % (ref 3.0–12.0)
Neutro Abs: 2.6 10*3/uL (ref 1.4–7.7)
Neutrophils Relative %: 42.1 % — ABNORMAL LOW (ref 43.0–77.0)
Platelets: 305 10*3/uL (ref 150.0–400.0)
RBC: 4.01 Mil/uL (ref 3.87–5.11)
RDW: 12.9 % (ref 11.5–15.5)
WBC: 6.2 10*3/uL (ref 4.0–10.5)

## 2022-07-21 LAB — LIPID PANEL
Cholesterol: 194 mg/dL (ref 0–200)
HDL: 75.5 mg/dL (ref >39.00)
LDL Cholesterol: 100 mg/dL — ABNORMAL HIGH (ref 0–99)
NonHDL: 118.33
Total CHOL/HDL Ratio: 3
Triglycerides: 92 mg/dL (ref 0.0–149.0)
VLDL: 18.4 mg/dL (ref 0.0–40.0)

## 2022-07-21 LAB — BASIC METABOLIC PANEL
BUN: 17 mg/dL (ref 6–23)
CO2: 29 mEq/L (ref 19–32)
Calcium: 9.8 mg/dL (ref 8.4–10.5)
Chloride: 104 mEq/L (ref 96–112)
Creatinine, Ser: 0.96 mg/dL (ref 0.40–1.20)
GFR: 64.93 mL/min (ref >60.00)
Glucose, Bld: 108 mg/dL — ABNORMAL HIGH (ref 70–99)
Potassium: 4.5 mEq/L (ref 3.5–5.1)
Sodium: 140 mEq/L (ref 135–145)

## 2022-07-21 LAB — VITAMIN D 25 HYDROXY (VIT D DEFICIENCY, FRACTURES): VITD: 46.97 ng/mL (ref 30.00–100.00)

## 2022-07-21 LAB — TSH: TSH: 2.91 u[IU]/mL (ref 0.35–5.50)

## 2022-07-21 MED ORDER — MONTELUKAST SODIUM 10 MG PO TABS
10.0000 mg | ORAL_TABLET | Freq: Every day | ORAL | 1 refills | Status: DC
  Filled 2022-07-21 – 2022-10-29 (×4): qty 90, 90d supply, fill #0
  Filled 2023-05-09: qty 90, 90d supply, fill #1

## 2022-07-21 MED ORDER — ALPRAZOLAM 0.5 MG PO TABS
0.5000 mg | ORAL_TABLET | Freq: Three times a day (TID) | ORAL | 3 refills | Status: DC | PRN
  Filled 2022-07-21: qty 60, 20d supply, fill #0
  Filled 2022-10-03: qty 60, 20d supply, fill #1

## 2022-07-21 MED ORDER — SERTRALINE HCL 100 MG PO TABS
100.0000 mg | ORAL_TABLET | Freq: Every day | ORAL | 1 refills | Status: DC
  Filled 2022-07-21: qty 90, 90d supply, fill #0
  Filled 2022-12-19: qty 90, 90d supply, fill #1

## 2022-07-21 MED ORDER — PANTOPRAZOLE SODIUM 40 MG PO TBEC
40.0000 mg | DELAYED_RELEASE_TABLET | Freq: Two times a day (BID) | ORAL | 1 refills | Status: DC
  Filled 2022-07-21: qty 180, 90d supply, fill #0
  Filled 2023-07-06 – 2023-07-10 (×2): qty 180, 90d supply, fill #1

## 2022-07-21 MED ORDER — ZOLPIDEM TARTRATE 10 MG PO TABS
10.0000 mg | ORAL_TABLET | Freq: Every evening | ORAL | 3 refills | Status: DC | PRN
  Filled 2022-07-21 – 2022-07-28 (×7): qty 30, 30d supply, fill #0
  Filled 2022-08-27: qty 30, 30d supply, fill #1
  Filled 2022-10-03 – 2022-10-17 (×2): qty 30, 30d supply, fill #2
  Filled 2022-12-06: qty 30, 30d supply, fill #0

## 2022-07-21 MED ORDER — SEMAGLUTIDE (1 MG/DOSE) 4 MG/3ML ~~LOC~~ SOPN
1.0000 mg | PEN_INJECTOR | SUBCUTANEOUS | 3 refills | Status: DC
  Filled 2022-07-21 – 2022-10-03 (×2): qty 3, 28d supply, fill #0

## 2022-07-21 MED ORDER — FLUTICASONE PROPIONATE 50 MCG/ACT NA SUSP
2.0000 | Freq: Every day | NASAL | 6 refills | Status: DC
  Filled 2022-07-21: qty 16, 30d supply, fill #0
  Filled 2022-10-03: qty 16, 30d supply, fill #1
  Filled 2023-07-06 – 2023-07-10 (×2): qty 16, 30d supply, fill #2

## 2022-07-21 MED ORDER — ALBUTEROL SULFATE HFA 108 (90 BASE) MCG/ACT IN AERS
2.0000 | INHALATION_SPRAY | Freq: Four times a day (QID) | RESPIRATORY_TRACT | 6 refills | Status: AC | PRN
  Filled 2022-07-21: qty 6.7, 25d supply, fill #0

## 2022-07-21 NOTE — Assessment & Plan Note (Signed)
Check labs and replete prn. 

## 2022-07-21 NOTE — Progress Notes (Signed)
   Subjective:    Patient ID: Katelyn Reyes, female    DOB: 27-Dec-1960, 62 y.o.   MRN: [id number]  HPI CPE- UTD on colonoscopy, pap, Tdap, flu.  Not a candidate for mammogram s/p double mastectomy  Patient Care Team    Relationship Specialty Notifications Start End  Midge Minium, MD PCP - General   05/29/10    Comment: Nestor Lewandowsky, MD Consulting Physician Obstetrics and Gynecology  06/06/15   Juanita Craver, MD Consulting Physician Gastroenterology  07/17/20   Volanda Napoleon, MD Medical Oncologist Oncology  04/02/21     Health Maintenance  Topic Date Due   MAMMOGRAM  02/25/2022   Hepatitis C Screening  01/18/2023 (Originally 06/02/1981)   HIV Screening  01/18/2023 (Originally 06/02/1978)   DTaP/Tdap/Td (2 - Td or Tdap) 03/23/2023   PAP SMEAR-Modifier  03/23/2023   COLONOSCOPY (Pts 45-4yr Insurance coverage will need to be confirmed)  07/17/2026   INFLUENZA VACCINE  Completed   Zoster Vaccines- Shingrix  Completed   HPV VACCINES  Aged Out   COVID-19 Vaccine  Discontinued      Review of Systems Patient reports no vision/ hearing changes, adenopathy,fever, weight change,  persistant/recurrent hoarseness , swallowing issues, chest pain, palpitations, edema, persistant/recurrent cough, hemoptysis, dyspnea (rest/exertional/paroxysmal nocturnal), gastrointestinal bleeding (melena, rectal bleeding), abdominal pain, significant heartburn, bowel changes, GU symptoms (dysuria, hematuria, incontinence), Gyn symptoms (abnormal  bleeding, pain),  syncope, focal weakness, memory loss, numbness & tingling, skin/hair/nail changes, abnormal bruising or bleeding, anxiety, or depression.     Objective:   Physical Exam General Appearance:    Alert, cooperative, no distress, appears stated age, obese  Head:    Normocephalic, without obvious abnormality, atraumatic  Eyes:    PERRL, conjunctiva/corneas clear, EOM's intact both eyes  Ears:    Normal TM's and external ear canals,  both ears  Nose:   Nares normal, septum midline, mucosa normal, no drainage    or sinus tenderness  Throat:   Lips, mucosa, and tongue normal; teeth and gums normal  Neck:   Supple, symmetrical, trachea midline, no adenopathy;    Thyroid: diffuse enlargement  Back:     Symmetric, no curvature, ROM normal, no CVA tenderness  Lungs:     Clear to auscultation bilaterally, respirations unlabored  Chest Wall:    No tenderness or deformity   Heart:    Regular rate and rhythm, S1 and S2 normal, no murmur, rub   or gallop  Breast Exam:    Deferred to GYN  Abdomen:     Soft, non-tender, bowel sounds active all four quadrants,    no masses, no organomegaly  Genitalia:    Deferred to GYN  Rectal:    Extremities:   Extremities normal, atraumatic, no cyanosis or edema  Pulses:   2+ and symmetric all extremities  Skin:   Skin color, texture, turgor normal, no rashes or lesions  Lymph nodes:   Cervical, supraclavicular, and axillary nodes normal  Neurologic:   CNII-XII intact, normal strength, sensation and reflexes    throughout          Assessment & Plan:

## 2022-07-21 NOTE — Assessment & Plan Note (Signed)
Ongoing issue for pt.  Stressed need for healthy diet and regular exercise.  Check labs to risk stratify.  Will follow 

## 2022-07-21 NOTE — Patient Instructions (Signed)
Follow up in 6 months to recheck weight loss progress We'll notify you of your lab results and make any changes if needed Continue to work on healthy diet and regular exercise- you can do it!!! Call with any questions or concerns Stay Safe!  Stay Healthy!! Happy Spring!!!

## 2022-07-21 NOTE — Assessment & Plan Note (Signed)
Pt's PE WNL w/ exception of obesity and known thyromegaly.  UTD on pap, colonoscopy, immunizations.  No longer having mammograms due to bilateral mastectomies.  Check labs.  Anticipatory guidance provided.

## 2022-07-22 ENCOUNTER — Other Ambulatory Visit: Payer: Self-pay

## 2022-07-22 ENCOUNTER — Telehealth: Payer: Self-pay

## 2022-07-22 NOTE — Telephone Encounter (Signed)
Informed pt of lab results  

## 2022-07-22 NOTE — Telephone Encounter (Signed)
-----   Message from Midge Minium, MD sent at 07/22/2022  7:13 AM EST ----- Labs look great!  No changes at this time

## 2022-07-25 ENCOUNTER — Other Ambulatory Visit (HOSPITAL_COMMUNITY): Payer: Self-pay

## 2022-07-28 ENCOUNTER — Other Ambulatory Visit (HOSPITAL_COMMUNITY): Payer: Self-pay

## 2022-07-31 ENCOUNTER — Ambulatory Visit: Payer: Commercial Managed Care - PPO | Admitting: Podiatry

## 2022-07-31 ENCOUNTER — Other Ambulatory Visit (HOSPITAL_COMMUNITY): Payer: Self-pay

## 2022-07-31 DIAGNOSIS — M722 Plantar fascial fibromatosis: Secondary | ICD-10-CM

## 2022-07-31 DIAGNOSIS — T148XXA Other injury of unspecified body region, initial encounter: Secondary | ICD-10-CM

## 2022-07-31 DIAGNOSIS — M76821 Posterior tibial tendinitis, right leg: Secondary | ICD-10-CM | POA: Diagnosis not present

## 2022-07-31 NOTE — Progress Notes (Signed)
Subjective:   Patient ID: Katelyn Reyes, female   DOB: 62 y.o.   MRN: [id number]   HPI Patient states she is still having a lot of pain in her right ankle and it has not improved with treatment   ROS      Objective:  Physical Exam  Neurovascular status intact with inflammation fluid and pain around the posterior tibial tendon as it comes under the medial malleolus and gets near insertion into the navicular.  Moderate depression of the arch and she feels like it is collapse muscle strength hard to judge might be weaker but appears to be intact with patient who has received her new orthotics     Assessment:  Possibility for tear of the posterior tibial tendon interstitial versus stretch versus other pathology that is causing chronic pain     Plan:  H&P reviewed applied a air fracture walker to completely immobilize advised on ice anti-inflammatories continue orthotics ordered MRI to try to rule out a tear reappoint when MRI finished

## 2022-08-04 ENCOUNTER — Telehealth: Payer: Self-pay

## 2022-08-04 NOTE — Telephone Encounter (Signed)
Submitted a PA for pt Rx Ozempic thru Cover my meds . Waiting on response they have 24 -72 hours to respond

## 2022-08-06 ENCOUNTER — Telehealth: Payer: Self-pay

## 2022-08-06 NOTE — Telephone Encounter (Signed)
[employer] sent a letter stating that pt Rx Ozempic was denied due not meeting the guideline which is diabetes per letter . Notified pt

## 2022-08-13 ENCOUNTER — Encounter: Payer: Self-pay | Admitting: Podiatry

## 2022-08-13 ENCOUNTER — Ambulatory Visit
Admission: RE | Admit: 2022-08-13 | Discharge: 2022-08-13 | Disposition: A | Discharge status: Home / Self Care | Payer: Commercial Managed Care - PPO | Source: Ambulatory Visit | Attending: Podiatry | Admitting: Podiatry

## 2022-08-13 DIAGNOSIS — T148XXA Other injury of unspecified body region, initial encounter: Secondary | ICD-10-CM

## 2022-08-13 DIAGNOSIS — M76821 Posterior tibial tendinitis, right leg: Secondary | ICD-10-CM

## 2022-08-13 DIAGNOSIS — S93401A Sprain of unspecified ligament of right ankle, initial encounter: Secondary | ICD-10-CM | POA: Diagnosis not present

## 2022-08-14 ENCOUNTER — Other Ambulatory Visit: Payer: Commercial Managed Care - PPO

## 2022-08-18 ENCOUNTER — Encounter: Payer: Self-pay | Admitting: Family Medicine

## 2022-08-27 ENCOUNTER — Other Ambulatory Visit (HOSPITAL_COMMUNITY): Payer: Self-pay

## 2022-08-27 ENCOUNTER — Encounter: Payer: Self-pay | Admitting: Podiatry

## 2022-08-27 ENCOUNTER — Ambulatory Visit: Payer: Commercial Managed Care - PPO | Admitting: Podiatry

## 2022-08-27 DIAGNOSIS — T148XXA Other injury of unspecified body region, initial encounter: Secondary | ICD-10-CM

## 2022-08-27 DIAGNOSIS — M76821 Posterior tibial tendinitis, right leg: Secondary | ICD-10-CM | POA: Diagnosis not present

## 2022-08-27 MED ORDER — MELOXICAM 15 MG PO TABS
15.0000 mg | ORAL_TABLET | Freq: Every day | ORAL | 2 refills | Status: DC
  Filled 2022-08-27: qty 30, 30d supply, fill #0

## 2022-08-27 MED ORDER — TRIAMCINOLONE ACETONIDE 10 MG/ML IJ SUSP
10.0000 mg | Freq: Once | INTRAMUSCULAR | Status: AC
  Administered 2022-08-27: 10 mg

## 2022-08-27 NOTE — Progress Notes (Signed)
Subjective:   Patient ID: Katelyn Reyes, female   DOB: 62 y.o.   MRN: [id number]   HPI Patient presents to review MRI states she is slightly better with immobilization but still having problems when she does not wear the boot neurovascular   ROS      Objective:  Physical Exam  Neurovascular status intact diminishment of arch height right significant with inflammation still of the posterior tibial tendon near the navicular with mild lateral pain when I press deep into the tendons but that has not of major concern for her.     Assessment:  Probable stretch of the posterior tibial tendon with foot structural issues and bony deformity leading to increased pressure on the medial arch with partial tear of the spring ligament     Plan:  H&P reviewed condition and explained to her results of MRI indicating tear of the peroneal but what appears to be stretching and inflammation of the posterior tibial.  Organ to try conservative still ultimately this may require surgery which she understands and today I did go ahead and I did a distal sheath injection posterior tibial 3 mg Dexasone Kenalog 5 mg Xylocaine continue boot for 3 more weeks and then wean off for 2 weeks and be seen back 5 weeks to see how she is responding and decide whether or not organ to take a more aggressive approach to condition

## 2022-08-28 DIAGNOSIS — M9906 Segmental and somatic dysfunction of lower extremity: Secondary | ICD-10-CM | POA: Diagnosis not present

## 2022-08-28 DIAGNOSIS — M9903 Segmental and somatic dysfunction of lumbar region: Secondary | ICD-10-CM | POA: Diagnosis not present

## 2022-08-28 DIAGNOSIS — S76302A Unspecified injury of muscle, fascia and tendon of the posterior muscle group at thigh level, left thigh, initial encounter: Secondary | ICD-10-CM | POA: Diagnosis not present

## 2022-08-28 DIAGNOSIS — M9905 Segmental and somatic dysfunction of pelvic region: Secondary | ICD-10-CM | POA: Diagnosis not present

## 2022-08-28 DIAGNOSIS — M722 Plantar fascial fibromatosis: Secondary | ICD-10-CM | POA: Diagnosis not present

## 2022-08-28 DIAGNOSIS — M9904 Segmental and somatic dysfunction of sacral region: Secondary | ICD-10-CM | POA: Diagnosis not present

## 2022-09-22 ENCOUNTER — Other Ambulatory Visit (HOSPITAL_BASED_OUTPATIENT_CLINIC_OR_DEPARTMENT_OTHER): Payer: Self-pay

## 2022-09-22 DIAGNOSIS — M9902 Segmental and somatic dysfunction of thoracic region: Secondary | ICD-10-CM | POA: Diagnosis not present

## 2022-09-22 DIAGNOSIS — M9905 Segmental and somatic dysfunction of pelvic region: Secondary | ICD-10-CM | POA: Diagnosis not present

## 2022-09-22 DIAGNOSIS — M9908 Segmental and somatic dysfunction of rib cage: Secondary | ICD-10-CM | POA: Diagnosis not present

## 2022-09-22 DIAGNOSIS — M9904 Segmental and somatic dysfunction of sacral region: Secondary | ICD-10-CM | POA: Diagnosis not present

## 2022-09-22 DIAGNOSIS — M9903 Segmental and somatic dysfunction of lumbar region: Secondary | ICD-10-CM | POA: Diagnosis not present

## 2022-09-22 DIAGNOSIS — M25562 Pain in left knee: Secondary | ICD-10-CM | POA: Diagnosis not present

## 2022-09-22 DIAGNOSIS — M25552 Pain in left hip: Secondary | ICD-10-CM | POA: Diagnosis not present

## 2022-09-22 DIAGNOSIS — M9906 Segmental and somatic dysfunction of lower extremity: Secondary | ICD-10-CM | POA: Diagnosis not present

## 2022-09-22 MED ORDER — TIZANIDINE HCL 4 MG PO TABS
4.0000 mg | ORAL_TABLET | Freq: Two times a day (BID) | ORAL | 0 refills | Status: DC
  Filled 2022-09-22: qty 60, 30d supply, fill #0

## 2022-09-29 ENCOUNTER — Encounter: Payer: Self-pay | Admitting: Podiatry

## 2022-09-29 ENCOUNTER — Ambulatory Visit: Payer: Commercial Managed Care - PPO | Admitting: Podiatry

## 2022-09-29 DIAGNOSIS — M76821 Posterior tibial tendinitis, right leg: Secondary | ICD-10-CM | POA: Diagnosis not present

## 2022-09-29 NOTE — Progress Notes (Signed)
Subjective:   Patient ID: Katelyn Reyes, female   DOB: 62 y.o.   MRN: [id number]   HPI Patient states doing much better with significant diminishment of discomfort on the inside and outside of the ankle and very pleased with where things are right now   ROS      Objective:  Physical Exam  Neurovascular status intact significant diminishment of discomfort medial lateral side of the ankle right with moderate swelling but orthotics been very beneficial     Assessment:  Tendinitis right improved with immobilization orthotics     Plan:  Reviewed and discussed do not recommend further therapy unless necessary but we will continue orthotics continue good supportive shoes and stretching.  Reappoint to recheck

## 2022-10-03 ENCOUNTER — Other Ambulatory Visit (HOSPITAL_COMMUNITY): Payer: Self-pay

## 2022-10-06 ENCOUNTER — Other Ambulatory Visit: Payer: Self-pay

## 2022-10-17 ENCOUNTER — Other Ambulatory Visit (HOSPITAL_COMMUNITY): Payer: Self-pay

## 2022-10-22 DIAGNOSIS — M79652 Pain in left thigh: Secondary | ICD-10-CM | POA: Diagnosis not present

## 2022-10-22 DIAGNOSIS — M9903 Segmental and somatic dysfunction of lumbar region: Secondary | ICD-10-CM | POA: Diagnosis not present

## 2022-10-22 DIAGNOSIS — M9904 Segmental and somatic dysfunction of sacral region: Secondary | ICD-10-CM | POA: Diagnosis not present

## 2022-10-22 DIAGNOSIS — M25562 Pain in left knee: Secondary | ICD-10-CM | POA: Diagnosis not present

## 2022-10-22 DIAGNOSIS — M9908 Segmental and somatic dysfunction of rib cage: Secondary | ICD-10-CM | POA: Diagnosis not present

## 2022-10-22 DIAGNOSIS — M25552 Pain in left hip: Secondary | ICD-10-CM | POA: Diagnosis not present

## 2022-10-22 DIAGNOSIS — M9905 Segmental and somatic dysfunction of pelvic region: Secondary | ICD-10-CM | POA: Diagnosis not present

## 2022-10-22 DIAGNOSIS — M9906 Segmental and somatic dysfunction of lower extremity: Secondary | ICD-10-CM | POA: Diagnosis not present

## 2022-10-29 ENCOUNTER — Other Ambulatory Visit: Payer: Self-pay | Admitting: Hematology & Oncology

## 2022-10-30 ENCOUNTER — Other Ambulatory Visit: Payer: Self-pay

## 2022-10-30 ENCOUNTER — Other Ambulatory Visit (HOSPITAL_COMMUNITY): Payer: Self-pay

## 2022-10-30 MED ORDER — ANASTROZOLE 1 MG PO TABS
1.0000 mg | ORAL_TABLET | Freq: Every day | ORAL | 12 refills | Status: DC
  Filled 2022-10-30: qty 90, 90d supply, fill #0
  Filled 2023-01-28: qty 90, 90d supply, fill #1
  Filled 2023-04-27: qty 90, 90d supply, fill #2
  Filled 2023-08-03: qty 90, 90d supply, fill #3

## 2022-11-03 ENCOUNTER — Other Ambulatory Visit (HOSPITAL_BASED_OUTPATIENT_CLINIC_OR_DEPARTMENT_OTHER): Payer: Self-pay

## 2022-11-03 ENCOUNTER — Other Ambulatory Visit (INDEPENDENT_AMBULATORY_CARE_PROVIDER_SITE_OTHER): Payer: Commercial Managed Care - PPO

## 2022-11-03 ENCOUNTER — Other Ambulatory Visit: Payer: Self-pay

## 2022-11-03 ENCOUNTER — Telehealth: Payer: Self-pay

## 2022-11-03 ENCOUNTER — Inpatient Hospital Stay: Payer: Commercial Managed Care - PPO | Admitting: Family

## 2022-11-03 ENCOUNTER — Inpatient Hospital Stay: Payer: Commercial Managed Care - PPO | Attending: Hematology & Oncology

## 2022-11-03 ENCOUNTER — Ambulatory Visit: Payer: Commercial Managed Care - PPO | Admitting: Hematology & Oncology

## 2022-11-03 ENCOUNTER — Encounter: Payer: Self-pay | Admitting: Family

## 2022-11-03 ENCOUNTER — Other Ambulatory Visit: Payer: Commercial Managed Care - PPO

## 2022-11-03 VITALS — BP 119/74 | HR 93 | Temp 98.3°F | Resp 17 | Wt 209.0 lb

## 2022-11-03 DIAGNOSIS — Z170 Estrogen receptor positive status [ER+]: Secondary | ICD-10-CM | POA: Insufficient documentation

## 2022-11-03 DIAGNOSIS — Z79811 Long term (current) use of aromatase inhibitors: Secondary | ICD-10-CM | POA: Diagnosis not present

## 2022-11-03 DIAGNOSIS — C50911 Malignant neoplasm of unspecified site of right female breast: Secondary | ICD-10-CM | POA: Insufficient documentation

## 2022-11-03 DIAGNOSIS — R7309 Other abnormal glucose: Secondary | ICD-10-CM

## 2022-11-03 DIAGNOSIS — E559 Vitamin D deficiency, unspecified: Secondary | ICD-10-CM | POA: Diagnosis not present

## 2022-11-03 DIAGNOSIS — C50411 Malignant neoplasm of upper-outer quadrant of right female breast: Secondary | ICD-10-CM

## 2022-11-03 LAB — CBC WITH DIFFERENTIAL (CANCER CENTER ONLY)
Abs Immature Granulocytes: 0.01 10*3/uL (ref 0.00–0.07)
Basophils Absolute: 0 10*3/uL (ref 0.0–0.1)
Basophils Relative: 1 %
Eosinophils Absolute: 0.2 10*3/uL (ref 0.0–0.5)
Eosinophils Relative: 3 %
HCT: 38.5 % (ref 36.0–46.0)
Hemoglobin: 12.5 g/dL (ref 12.0–15.0)
Immature Granulocytes: 0 %
Lymphocytes Relative: 47 %
Lymphs Abs: 3.1 10*3/uL (ref 0.7–4.0)
MCH: 31.4 pg (ref 26.0–34.0)
MCHC: 32.5 g/dL (ref 30.0–36.0)
MCV: 96.7 fL (ref 80.0–100.0)
Monocytes Absolute: 0.5 10*3/uL (ref 0.1–1.0)
Monocytes Relative: 8 %
Neutro Abs: 2.7 10*3/uL (ref 1.7–7.7)
Neutrophils Relative %: 41 %
Platelet Count: 302 10*3/uL (ref 150–400)
RBC: 3.98 MIL/uL (ref 3.87–5.11)
RDW: 11.9 % (ref 11.5–15.5)
WBC Count: 6.6 10*3/uL (ref 4.0–10.5)
nRBC: 0 % (ref 0.0–0.2)

## 2022-11-03 LAB — CMP (CANCER CENTER ONLY)
ALT: 25 U/L (ref 0–44)
AST: 24 U/L (ref 15–41)
Albumin: 4.4 g/dL (ref 3.5–5.0)
Alkaline Phosphatase: 76 U/L (ref 38–126)
Anion gap: 9 (ref 5–15)
BUN: 18 mg/dL (ref 6–20)
CO2: 31 mmol/L (ref 22–32)
Calcium: 10.3 mg/dL (ref 8.9–10.3)
Chloride: 104 mmol/L (ref 98–111)
Creatinine: 0.89 mg/dL (ref 0.44–1.00)
GFR, Estimated: >60 mL/min (ref >60)
Glucose, Bld: 122 mg/dL — ABNORMAL HIGH (ref 70–99)
Potassium: 4.1 mmol/L (ref 3.5–5.1)
Sodium: 144 mmol/L (ref 135–145)
Total Bilirubin: 0.4 mg/dL (ref 0.3–1.2)
Total Protein: 7.4 g/dL (ref 6.5–8.1)

## 2022-11-03 LAB — VITAMIN D 25 HYDROXY (VIT D DEFICIENCY, FRACTURES): Vit D, 25-Hydroxy: 48.01 ng/mL (ref 30–100)

## 2022-11-03 NOTE — Telephone Encounter (Signed)
Pt is coming in for a A1c and order is in

## 2022-11-03 NOTE — Telephone Encounter (Signed)
Advised pt that she could call Dr Katelyn Reyes and they told her they could not add the A1C she is asking If she can come in and have a lab drew here for A1C ?

## 2022-11-03 NOTE — Progress Notes (Signed)
Hematology and Oncology Follow Up Visit  Katelyn Reyes [id number] Apr 24, 1961 62 y.o. 11/03/2022   Principle Diagnosis:  Stage IA (T1aN0M0) infiltrating ductal carcinoma of the right breast- ER+/PR+/HER2-   Current Therapy:        Status post bilateral mastectomies --04/26/2021 Arimidex 1 mg p.o. daily --start on 06/29/2021   Interim History:  Katelyn Reyes is here today for follow-up. She is doing well but has noted some joint aches and pains in the mornings. Sometimes this causes her to not make it to the bathroom in time to urinate.  We will have her try taking glucosamine chondroitin daily and see if this helps give her some relief.  Bilateral breast exam negative. No changes to her mastectomies with reconstruction. No mass, lesion or rash noted.  No adenopathy or lymphedema.  No fever, chills, n/v, cough, rash, dizziness, SOB, chest pain, palpitations, abdominal pain/bloating or changes in bowel habits at this time. No blood loss, bruising or petechiae.  No swelling, numbness or tingling in her extremities.  No falls or syncope reported.  Appetite and hydration are good. Weight is stable at 209 lbs.   ECOG Performance Status: 0 - Asymptomatic  Medications:  Allergies as of 11/03/2022       Reactions   Amoxicillin Rash, Other (See Comments)   Has patient had a PCN reaction causing immediate rash, facial/tongue/throat swelling, SOB or lightheadedness with hypotension: No Has patient had a PCN reaction causing severe rash involving mucus membranes or skin necrosis: No Has patient had a PCN reaction that required treatment: #  #  #  YES  #  #  # - MD office Has patient had a PCN reaction occurring within the last 10 years: Yes If all of the above answers are "NO", then may proceed with Cephalosporin use.   Erythromycin Nausea Only   Hydrocodone-acetaminophen Other (See Comments)   Hallucinations   Penicillin G Rash   Shellfish Allergy Itching, Other (See Comments)   REACTS TO  SCALLOPS        Medication List        Accurate as of Nov 03, 2022  9:00 AM. If you have any questions, ask your nurse or doctor.          albuterol 108 (90 Base) MCG/ACT inhaler Commonly known as: VENTOLIN HFA Inhale 2 puffs into the lungs every 6 (six) hours as needed for wheezing.   ALPRAZolam 0.5 MG tablet Commonly known as: XANAX Take 1 tablet (0.5 mg total) by mouth 3 (three) times daily as needed.   anastrozole 1 MG tablet Commonly known as: ARIMIDEX Take 1 tablet (1 mg total) by mouth daily.   cetirizine 10 MG tablet Commonly known as: ZYRTEC Take 1 tablet (10 mg total) by mouth daily.   fluticasone 50 MCG/ACT nasal spray Commonly known as: FLONASE Place 2 sprays into both nostrils daily.   gabapentin 300 MG capsule Commonly known as: NEURONTIN Take 2 capsules (600 mg total) by mouth at bedtime.   meloxicam 15 MG tablet Commonly known as: MOBIC Take 1 tablet (15 mg total) by mouth daily.   meloxicam 15 MG tablet Commonly known as: MOBIC Take 1 tablet (15 mg total) by mouth daily.   montelukast 10 MG tablet Commonly known as: SINGULAIR Take 1 tablet (10 mg total) by mouth daily.   pantoprazole 40 MG tablet Commonly known as: PROTONIX Take 1 tablet (40 mg total) by mouth 2 (two) times daily.   Semaglutide (1 MG/DOSE) 4 MG/3ML Sopn Inject  1 mg as directed once a week.   sertraline 100 MG tablet Commonly known as: ZOLOFT Take 1 tablet (100 mg total) by mouth daily.   TechLite Pen Needles 32G X 4 MM Misc Generic drug: Insulin Pen Needle Use daily w/ saxenda   tiZANidine 4 MG tablet Commonly known as: Zanaflex Take 1 tablet (4 mg total) by mouth 2 (two) times daily.   Vitamin D-3 125 MCG (5000 UT) Tabs Take 5,000 Units by mouth every evening.   zolpidem 10 MG tablet Commonly known as: AMBIEN Take 1 tablet (10 mg total) by mouth at bedtime as needed for sleep.        Allergies:  Allergies  Allergen Reactions   Amoxicillin Rash and  Other (See Comments)    Has patient had a PCN reaction causing immediate rash, facial/tongue/throat swelling, SOB or lightheadedness with hypotension: No Has patient had a PCN reaction causing severe rash involving mucus membranes or skin necrosis: No Has patient had a PCN reaction that required treatment: #  #  #  YES  #  #  # - MD office Has patient had a PCN reaction occurring within the last 10 years: Yes If all of the above answers are "NO", then may proceed with Cephalosporin use.    Erythromycin Nausea Only   Hydrocodone-Acetaminophen Other (See Comments)    Hallucinations   Penicillin G Rash   Shellfish Allergy Itching and Other (See Comments)    REACTS TO SCALLOPS     Past Medical History, Surgical history, Social history, and Family History were reviewed and updated.  Review of Systems: All other 10 point review of systems is negative.   Physical Exam:  vitals were not taken for this visit.   Wt Readings from Last 3 Encounters:  07/21/22 196 lb (88.9 kg)  07/07/22 193 lb (87.5 kg)  04/21/22 196 lb 6 oz (89.1 kg)    Ocular: Sclerae unicteric, pupils equal, round and reactive to light Ear-nose-throat: Oropharynx clear, dentition fair Lymphatic: No cervical or supraclavicular adenopathy Lungs no rales or rhonchi, good excursion bilaterally Heart regular rate and rhythm, no murmur appreciated Abd soft, nontender, positive bowel sounds MSK no focal spinal tenderness, no joint edema Neuro: non-focal, well-oriented, appropriate affect Breasts: Same as above.  Lab Results  Component Value Date   WBC 6.6 11/03/2022   HGB 12.5 11/03/2022   HCT 38.5 11/03/2022   MCV 96.7 11/03/2022   PLT 302 11/03/2022   No results found for: "FERRITIN", "IRON", "TIBC", "UIBC", "IRONPCTSAT" Lab Results  Component Value Date   RBC 3.98 11/03/2022   No results found for: "KPAFRELGTCHN", "LAMBDASER", "KAPLAMBRATIO" No results found for: "IGGSERUM", "IGA", "IGMSERUM" No results found  for: "TOTALPROTELP", "ALBUMINELP", "A1GS", "A2GS", "BETS", "BETA2SER", "GAMS", "MSPIKE", "SPEI"   Chemistry      Component Value Date/Time   NA 144 11/03/2022 0737   K 4.1 11/03/2022 0737   CL 104 11/03/2022 0737   CO2 31 11/03/2022 0737   BUN 18 11/03/2022 0737   CREATININE 0.89 11/03/2022 0737   CREATININE 0.92 04/28/2017 1645      Component Value Date/Time   CALCIUM 10.3 11/03/2022 0737   ALKPHOS 76 11/03/2022 0737   AST 24 11/03/2022 0737   ALT 25 11/03/2022 0737   BILITOT 0.4 11/03/2022 0737       Impression and Plan: Ms. Floriano is a very pleasant 62 yo postmenopausal African American female with history of very early stage ductal carcinoma of the right breast.  She had bilateral mastectomies  in 04/2021 and is currently on Arimidex which she has tolerated nicely.  She will continue her same regimen.  Follow-up in 4 months.   Eileen Stanford, NP 5/20/20249:00 AM

## 2022-11-03 NOTE — Telephone Encounter (Signed)
See other

## 2022-11-03 NOTE — Telephone Encounter (Signed)
Ok to order A1C (dx elevated glucose) and schedule lab visit

## 2022-11-03 NOTE — Telephone Encounter (Signed)
Patient called stating [facility] was [facility] and she is requesting check to A1c do you want an office visit as well or lab only check on this?  Call back: [phone]

## 2022-11-03 NOTE — Telephone Encounter (Signed)
She can ask Dr Myna Hidalgo to add an A1C to the labs that were drawn this morning as the lab still has the blood and an appropriate tube was drawn

## 2022-11-03 NOTE — Telephone Encounter (Signed)
Pt states her fasting glucose at [facility] was hight this morning she is wanting a A1C . Would you prefer her to come in for an apt or order lab ?

## 2022-11-04 LAB — HEMOGLOBIN A1C: Hgb A1c MFr Bld: 6.1 % (ref 4.6–6.5)

## 2022-11-05 ENCOUNTER — Telehealth: Payer: Self-pay

## 2022-11-05 ENCOUNTER — Encounter: Payer: Self-pay | Admitting: Family Medicine

## 2022-11-05 NOTE — Telephone Encounter (Signed)
-----   Message from Sheliah Hatch, MD sent at 11/04/2022  8:30 PM EDT ----- You are in the prediabetes range w/ an A1C of 6.1%  Please work on low carb diet and regular physical activity.  No meds needed at this time

## 2022-11-05 NOTE — Telephone Encounter (Signed)
Pt seen results Via my chart  

## 2022-11-06 NOTE — Telephone Encounter (Signed)
Pt asking to start at 5 and split it to do lower dose? Is this a possibility? Or does patient need the smaller dose on its own

## 2022-11-07 ENCOUNTER — Other Ambulatory Visit (HOSPITAL_COMMUNITY): Payer: Self-pay

## 2022-11-07 MED ORDER — TIRZEPATIDE 5 MG/0.5ML ~~LOC~~ SOAJ
5.0000 mg | SUBCUTANEOUS | 1 refills | Status: DC
  Filled 2022-11-07: qty 2, 28d supply, fill #0

## 2022-11-07 NOTE — Addendum Note (Signed)
Addended by: Sheliah Hatch on: 11/07/2022 12:21 PM   Modules accepted: Orders

## 2022-11-08 ENCOUNTER — Other Ambulatory Visit (HOSPITAL_COMMUNITY): Payer: Self-pay

## 2022-11-11 ENCOUNTER — Other Ambulatory Visit (HOSPITAL_COMMUNITY): Payer: Self-pay

## 2022-12-06 ENCOUNTER — Other Ambulatory Visit (HOSPITAL_COMMUNITY): Payer: Self-pay

## 2022-12-14 ENCOUNTER — Telehealth: Payer: Self-pay

## 2022-12-14 NOTE — Telephone Encounter (Signed)
Patient wanted to know if she can have the next dose up of her mounjaro. I did let her know that Dr Beverely Low was out of the office but I would send her a message and get that taken care of.

## 2022-12-15 ENCOUNTER — Ambulatory Visit: Payer: Commercial Managed Care - PPO | Admitting: Family Medicine

## 2022-12-16 ENCOUNTER — Encounter: Payer: Self-pay | Admitting: Family Medicine

## 2022-12-16 ENCOUNTER — Ambulatory Visit: Payer: Commercial Managed Care - PPO | Admitting: Family Medicine

## 2022-12-16 VITALS — BP 104/70 | HR 81 | Temp 97.9°F | Ht 62.0 in | Wt 202.1 lb

## 2022-12-16 DIAGNOSIS — H6122 Impacted cerumen, left ear: Secondary | ICD-10-CM

## 2022-12-16 NOTE — Progress Notes (Signed)
   Acute Office Visit   Subjective:  Patient ID: Katelyn Reyes, female    DOB: 1960/11/20, 62 y.o.   MRN: [id number]  Chief Complaint  Patient presents with   pressure in left ear    Pt is here today with C/O of left ear pressure Pt reports sx for 2 weeks. Pt reports she tried to use ear drops and this helped some    HPI Patient is a 62 year old female that presents with pressure in her left ear. She reports symptoms started 2 weeks ago. She reports on Saturday she tried ear lavage at home with Debrox. Prior she has been placing Debrox ear drops in her ear for two weeks. Since Saturday she has not done anything. She reports there is still pressure and muffled hearing.   ROS See HPI above      Objective:   BP 104/70   Pulse 81   Temp 97.9 F (36.6 C)   Ht 5\' 2"  (1.575 m)   Wt 202 lb 2 oz (91.7 kg)   LMP  (LMP Unknown)   SpO2 98%   BMI 36.97 kg/m    Physical Exam Vitals reviewed.  Constitutional:      General: She is not in acute distress.    Appearance: Normal appearance. She is not ill-appearing, toxic-appearing or diaphoretic.  HENT:     Head: Normocephalic and atraumatic.     Right Ear: Tympanic membrane, ear canal and external ear normal.     Left Ear: There is impacted cerumen.  Eyes:     General:        Right eye: No discharge.        Left eye: No discharge.     Conjunctiva/sclera: Conjunctivae normal.  Cardiovascular:     Rate and Rhythm: Normal rate.  Pulmonary:     Effort: Pulmonary effort is normal. No respiratory distress.  Musculoskeletal:        General: Normal range of motion.  Lymphadenopathy:     Head:     Left side of head: No submental, submandibular, preauricular or posterior auricular adenopathy.  Skin:    General: Skin is warm and dry.  Neurological:     General: No focal deficit present.     Mental Status: She is alert and oriented to person, place, and time. Mental status is at baseline.  Psychiatric:        Mood and Affect: Mood  normal.        Behavior: Behavior normal.        Thought Content: Thought content normal.        Judgment: Judgment normal.   PRE-PROCEDURE EXAM: Left TM cannot be visualized due to partial or total occlusion/impaction of the ear canal. PROCEDURE INDICATION: Remove wax to visualize ear drum & relieve discomfort CONSENT:  Verbal   PROCEDURE NOTE: Left ear: The CMA, [employer], irrigated left ear with warm water and ear drops to remove the wax. 100% of the wax was removed.  POST- PROCEDURE EXAM: TMs successfully visualized. TM with no erythema. The patient tolerated the procedure well.      Assessment & Plan:  Hearing loss of left ear due to cerumen impaction  -Ear lavage completed on left ear due to impaction. Successful   Zandra Abts, NP

## 2022-12-16 NOTE — Patient Instructions (Signed)
-  Ear lavage completed on left ear due to impaction. Successful

## 2022-12-19 ENCOUNTER — Other Ambulatory Visit: Payer: Self-pay | Admitting: Hematology & Oncology

## 2022-12-19 ENCOUNTER — Other Ambulatory Visit (HOSPITAL_COMMUNITY): Payer: Self-pay

## 2022-12-19 MED ORDER — GABAPENTIN 300 MG PO CAPS
600.0000 mg | ORAL_CAPSULE | Freq: Every day | ORAL | 4 refills | Status: DC
  Filled 2022-12-19: qty 60, 30d supply, fill #0
  Filled 2023-02-19: qty 60, 30d supply, fill #1
  Filled 2023-02-19: qty 60, 30d supply, fill #0
  Filled 2023-04-20: qty 60, 30d supply, fill #1
  Filled 2023-06-05: qty 60, 30d supply, fill #2
  Filled 2023-07-06 – 2023-07-10 (×2): qty 60, 30d supply, fill #3

## 2022-12-19 MED ORDER — TIRZEPATIDE 7.5 MG/0.5ML ~~LOC~~ SOAJ
7.5000 mg | SUBCUTANEOUS | 1 refills | Status: DC
  Filled 2022-12-19: qty 2, 28d supply, fill #0
  Filled 2023-01-15 – 2023-01-16 (×2): qty 2, 28d supply, fill #1

## 2022-12-19 NOTE — Telephone Encounter (Signed)
Sent pt a mychart message. 

## 2022-12-19 NOTE — Telephone Encounter (Signed)
Prescription sent for [facility] 7.5mg  weekly as requested

## 2022-12-19 NOTE — Addendum Note (Signed)
Addended by: Sheliah Hatch on: 12/19/2022 07:26 AM   Modules accepted: Orders

## 2023-01-06 ENCOUNTER — Other Ambulatory Visit (HOSPITAL_BASED_OUTPATIENT_CLINIC_OR_DEPARTMENT_OTHER): Payer: Self-pay

## 2023-01-06 ENCOUNTER — Other Ambulatory Visit: Payer: Self-pay

## 2023-01-06 ENCOUNTER — Other Ambulatory Visit: Payer: Self-pay | Admitting: Family Medicine

## 2023-01-06 MED ORDER — ZOLPIDEM TARTRATE 10 MG PO TABS
10.0000 mg | ORAL_TABLET | Freq: Every evening | ORAL | 3 refills | Status: DC | PRN
  Filled 2023-01-06 – 2023-02-06 (×2): qty 30, 30d supply, fill #0
  Filled 2023-03-07: qty 30, 30d supply, fill #1
  Filled 2023-04-07: qty 30, 30d supply, fill #2

## 2023-01-06 NOTE — Telephone Encounter (Signed)
Patient is requesting a refill of the following medications: Requested Prescriptions   Pending Prescriptions Disp Refills   zolpidem (AMBIEN) 10 MG tablet 30 tablet 3    Sig: Take 1 tablet (10 mg total) by mouth at bedtime as needed for sleep.    Date of patient request: 01/06/23 Last office visit: 12/16/22 Date of last refill: 07/21/22 Last refill amount: 30 Follow up time period per chart: 01/19/23

## 2023-01-06 NOTE — Telephone Encounter (Signed)
Called pt and LM informing her

## 2023-01-16 ENCOUNTER — Other Ambulatory Visit (HOSPITAL_COMMUNITY): Payer: Self-pay

## 2023-01-16 ENCOUNTER — Other Ambulatory Visit: Payer: Self-pay

## 2023-01-19 ENCOUNTER — Other Ambulatory Visit (HOSPITAL_COMMUNITY): Payer: Self-pay

## 2023-01-19 ENCOUNTER — Ambulatory Visit: Payer: Commercial Managed Care - PPO | Admitting: Family Medicine

## 2023-01-19 ENCOUNTER — Encounter: Payer: Self-pay | Admitting: Family Medicine

## 2023-01-19 ENCOUNTER — Telehealth: Payer: Self-pay

## 2023-01-19 VITALS — BP 124/82 | HR 85 | Temp 97.7°F | Resp 19 | Ht 62.0 in | Wt 195.5 lb

## 2023-01-19 DIAGNOSIS — R7303 Prediabetes: Secondary | ICD-10-CM

## 2023-01-19 DIAGNOSIS — E669 Obesity, unspecified: Secondary | ICD-10-CM

## 2023-01-19 LAB — HEPATIC FUNCTION PANEL
ALT: 26 U/L (ref 0–35)
AST: 23 U/L (ref 0–37)
Albumin: 4.3 g/dL (ref 3.5–5.2)
Alkaline Phosphatase: 84 U/L (ref 39–117)
Bilirubin, Direct: 0.1 mg/dL (ref 0.0–0.3)
Total Bilirubin: 0.5 mg/dL (ref 0.2–1.2)
Total Protein: 7.4 g/dL (ref 6.0–8.3)

## 2023-01-19 LAB — CBC WITH DIFFERENTIAL/PLATELET
Basophils Absolute: 0 10*3/uL (ref 0.0–0.1)
Basophils Relative: 0.7 % (ref 0.0–3.0)
Eosinophils Absolute: 0.3 10*3/uL (ref 0.0–0.7)
Eosinophils Relative: 3.8 % (ref 0.0–5.0)
HCT: 38.4 % (ref 36.0–46.0)
Hemoglobin: 12.4 g/dL (ref 12.0–15.0)
Lymphocytes Relative: 47.8 % — ABNORMAL HIGH (ref 12.0–46.0)
Lymphs Abs: 3.1 10*3/uL (ref 0.7–4.0)
MCHC: 32.3 g/dL (ref 30.0–36.0)
MCV: 93.4 fl (ref 78.0–100.0)
Monocytes Absolute: 0.5 10*3/uL (ref 0.1–1.0)
Monocytes Relative: 7.8 % (ref 3.0–12.0)
Neutro Abs: 2.6 10*3/uL (ref 1.4–7.7)
Neutrophils Relative %: 39.9 % — ABNORMAL LOW (ref 43.0–77.0)
Platelets: 317 10*3/uL (ref 150.0–400.0)
RBC: 4.12 Mil/uL (ref 3.87–5.11)
RDW: 13.3 % (ref 11.5–15.5)
WBC: 6.5 10*3/uL (ref 4.0–10.5)

## 2023-01-19 LAB — LIPID PANEL
Cholesterol: 169 mg/dL (ref 0–200)
HDL: 55.9 mg/dL (ref >39.00)
LDL Cholesterol: 91 mg/dL (ref 0–99)
NonHDL: 112.6
Total CHOL/HDL Ratio: 3
Triglycerides: 109 mg/dL (ref 0.0–149.0)
VLDL: 21.8 mg/dL (ref 0.0–40.0)

## 2023-01-19 LAB — TSH: TSH: 2.48 u[IU]/mL (ref 0.35–5.50)

## 2023-01-19 LAB — BASIC METABOLIC PANEL
BUN: 11 mg/dL (ref 6–23)
CO2: 29 mEq/L (ref 19–32)
Calcium: 9.6 mg/dL (ref 8.4–10.5)
Chloride: 104 mEq/L (ref 96–112)
Creatinine, Ser: 0.93 mg/dL (ref 0.40–1.20)
GFR: 67.22 mL/min (ref >60.00)
Glucose, Bld: 94 mg/dL (ref 70–99)
Potassium: 4.1 mEq/L (ref 3.5–5.1)
Sodium: 141 mEq/L (ref 135–145)

## 2023-01-19 MED ORDER — TIRZEPATIDE 10 MG/0.5ML ~~LOC~~ SOAJ
10.0000 mg | SUBCUTANEOUS | 1 refills | Status: DC
  Filled 2023-01-19 – 2023-02-19 (×3): qty 2, 28d supply, fill #0
  Filled 2023-02-19: qty 6, 84d supply, fill #0
  Filled 2023-03-27: qty 2, 28d supply, fill #1

## 2023-01-19 NOTE — Assessment & Plan Note (Signed)
Strong family hx of DM.  A1C got as high as 6.4% before she brought it down to current reading of 6.1%.  She does not want to carry diagnosis if possible.  Will continue to follow.

## 2023-01-19 NOTE — Telephone Encounter (Signed)
Pt called would like a call back.

## 2023-01-19 NOTE — Progress Notes (Signed)
   Subjective:    Patient ID: Katelyn Reyes, female    DOB: [birth year], 62 y.o.   MRN: [id number]  HPI Obesity- pt was 196 in Feb.  202 last month.  Down to 195 lbs today.  Current BMI 35.76.  On Mounjaro 7.5mg  weekly, eating small meals, exercising regularly.  Pt reports feeling well.  No CP, SOB, HA's, abd pain, N/V.  Pt's goal is to avoid diabetes as this runs very strongly in her family.   Review of Systems For ROS see HPI     Objective:   Physical Exam Vitals reviewed.  Constitutional:      General: She is not in acute distress.    Appearance: Normal appearance. She is well-developed. She is obese. She is not ill-appearing.  HENT:     Head: Normocephalic and atraumatic.  Eyes:     Conjunctiva/sclera: Conjunctivae normal.     Pupils: Pupils are equal, round, and reactive to light.  Neck:     Thyroid: No thyromegaly.  Cardiovascular:     Rate and Rhythm: Normal rate and regular rhythm.     Heart sounds: Normal heart sounds. No murmur heard. Pulmonary:     Effort: Pulmonary effort is normal. No respiratory distress.     Breath sounds: Normal breath sounds.  Abdominal:     General: There is no distension.     Palpations: Abdomen is soft.     Tenderness: There is no abdominal tenderness.  Musculoskeletal:     Cervical back: Normal range of motion and neck supple.  Lymphadenopathy:     Cervical: No cervical adenopathy.  Skin:    General: Skin is warm and dry.  Neurological:     Mental Status: She is alert and oriented to person, place, and time.  Psychiatric:        Behavior: Behavior normal.           Assessment & Plan:

## 2023-01-19 NOTE — Assessment & Plan Note (Signed)
Pt is down nearly 7 lbs since last month.  Currently on Mounjaro 7.5mg  weekly.  Would like to increase to 10mg  weekly.  She has changed her eating habits and is exercising regularly.  A1C 6.1 in May.  No need to repeat at this time.  Check cholesterol as it has been 6 months.  Encouraged her to continue her efforts.

## 2023-01-19 NOTE — Telephone Encounter (Signed)
I informed pt of lab results °

## 2023-01-19 NOTE — Patient Instructions (Signed)
Schedule your complete physical in 6 months We'll notify you of your lab results and make any changes if needed Keep up the good work on healthy diet and regular exercise- you're doing great! Call with any questions or concerns Stay Safe!  Stay Healthy! Enjoy the rest of your summer!!!

## 2023-01-19 NOTE — Telephone Encounter (Signed)
-----   Message from Neena Rhymes sent at 01/19/2023 12:33 PM EDT ----- Labs look great!  No changes at this time

## 2023-01-19 NOTE — Telephone Encounter (Signed)
I sent the results Via my chart to pt no answer when I called and VM is full

## 2023-01-27 ENCOUNTER — Other Ambulatory Visit (HOSPITAL_COMMUNITY): Payer: Self-pay

## 2023-01-27 ENCOUNTER — Telehealth: Payer: Self-pay

## 2023-01-27 NOTE — Telephone Encounter (Signed)
*  Primary  Pharmacy Patient Advocate Encounter   Received notification from Cover[employer]eds that prior authorization for Mounjaro 10MG /0.5ML pen-injectors  is required/requested.   Insurance verification completed.   The patient is insured through [facility] .   Per test claim: PA required; PA started via CoverMyMeds. KEY [id number] . Waiting for clinical questions to populate.

## 2023-01-28 ENCOUNTER — Telehealth: Payer: Self-pay

## 2023-01-28 ENCOUNTER — Other Ambulatory Visit (HOSPITAL_COMMUNITY): Payer: Self-pay

## 2023-01-28 NOTE — Telephone Encounter (Signed)
Pt states she does pay out of pocket

## 2023-01-28 NOTE — Telephone Encounter (Signed)
[employer] denied the PA for pt Rx Monunjaro it did not meet their guidelines would you like to try something differ net ?

## 2023-01-28 NOTE — Telephone Encounter (Signed)
I believe pt has been paying out of pocket for medication.  No further work needed

## 2023-01-30 NOTE — Telephone Encounter (Signed)
Pharmacy Patient Advocate Encounter  Received notification from [facility] that Prior Authorization for [facility] 10mg  has been DENIED. Please advise how you'd like to proceed. Full denial letter will be uploaded to the media tab. See denial reason below.  Our guideline named GLP-1 AGONIST (reviewed for [facility]) requires that you have a diagnosis of type 2 diabetes mellitus (a chronic condition in which your body is resistant to insulin, a hormone that regulates your blood sugar). Your doctor requested this medication for for prediabetes (a health condition that means your blood sugar level is higher than normal, but not yet high enough for you to be diagnosed with  diabetes) and weight loss or weight management. This request was denied because your conditions do not meet the requirement for approval.  Please note that your plan does not cover weight loss medications under the pharmacy. Greggory Keen will not be approved for weight loss purposes. Please talk with your provider about other treatment options for your conditions or get [place] more information if it will allow [place] to approve this request.

## 2023-02-03 ENCOUNTER — Other Ambulatory Visit (HOSPITAL_COMMUNITY): Payer: Self-pay

## 2023-02-04 ENCOUNTER — Other Ambulatory Visit (HOSPITAL_COMMUNITY): Payer: Self-pay

## 2023-02-06 ENCOUNTER — Other Ambulatory Visit (HOSPITAL_COMMUNITY): Payer: Self-pay

## 2023-02-09 DIAGNOSIS — Z9013 Acquired absence of bilateral breasts and nipples: Secondary | ICD-10-CM | POA: Diagnosis not present

## 2023-02-09 DIAGNOSIS — L905 Scar conditions and fibrosis of skin: Secondary | ICD-10-CM | POA: Diagnosis not present

## 2023-02-09 DIAGNOSIS — Z853 Personal history of malignant neoplasm of breast: Secondary | ICD-10-CM | POA: Diagnosis not present

## 2023-02-12 ENCOUNTER — Ambulatory Visit: Payer: Commercial Managed Care - PPO

## 2023-02-19 ENCOUNTER — Other Ambulatory Visit (HOSPITAL_COMMUNITY): Payer: Self-pay

## 2023-02-23 DIAGNOSIS — Z139 Encounter for screening, unspecified: Secondary | ICD-10-CM | POA: Diagnosis not present

## 2023-02-23 DIAGNOSIS — Z1331 Encounter for screening for depression: Secondary | ICD-10-CM | POA: Diagnosis not present

## 2023-02-23 DIAGNOSIS — Z01419 Encounter for gynecological examination (general) (routine) without abnormal findings: Secondary | ICD-10-CM | POA: Diagnosis not present

## 2023-02-23 DIAGNOSIS — Z1211 Encounter for screening for malignant neoplasm of colon: Secondary | ICD-10-CM | POA: Diagnosis not present

## 2023-02-23 DIAGNOSIS — Z1239 Encounter for other screening for malignant neoplasm of breast: Secondary | ICD-10-CM | POA: Diagnosis not present

## 2023-02-23 DIAGNOSIS — R87810 Cervical high risk human papillomavirus (HPV) DNA test positive: Secondary | ICD-10-CM | POA: Diagnosis not present

## 2023-02-26 ENCOUNTER — Other Ambulatory Visit: Payer: Self-pay | Admitting: Family Medicine

## 2023-02-26 ENCOUNTER — Other Ambulatory Visit (HOSPITAL_COMMUNITY): Payer: Self-pay

## 2023-02-26 MED ORDER — ALPRAZOLAM 0.5 MG PO TABS
0.5000 mg | ORAL_TABLET | Freq: Three times a day (TID) | ORAL | 3 refills | Status: DC | PRN
Start: 2023-02-26 — End: 2023-07-29
  Filled 2023-02-26 – 2023-06-08 (×2): qty 60, 20d supply, fill #0

## 2023-02-26 NOTE — Telephone Encounter (Signed)
Xanax 0.5 mg Requested Prescriptions   Pending Prescriptions Disp Refills   ALPRAZolam (XANAX) 0.5 MG tablet 60 tablet 3    Sig: Take 1 tablet (0.5 mg total) by mouth 3 (three) times daily as needed.     Date of patient request: 02/26/23 Last office visit: 01/19/23 Date of last refill: 07/21/22 Last refill amount: 60 Follow up time period per chart: 6 months

## 2023-02-27 LAB — HM PAP SMEAR
HPV 16/18/45 genotyping: NEGATIVE
HPV, high-risk: POSITIVE

## 2023-02-27 LAB — RESULTS CONSOLE HPV: CHL HPV: POSITIVE

## 2023-03-02 ENCOUNTER — Other Ambulatory Visit: Payer: Self-pay

## 2023-03-02 ENCOUNTER — Inpatient Hospital Stay: Payer: Commercial Managed Care - PPO | Admitting: Family

## 2023-03-02 ENCOUNTER — Encounter: Payer: Self-pay | Admitting: Family

## 2023-03-02 ENCOUNTER — Inpatient Hospital Stay: Payer: Commercial Managed Care - PPO | Attending: Hematology & Oncology

## 2023-03-02 VITALS — BP 106/74 | HR 74 | Temp 98.8°F | Resp 17 | Ht 62.0 in | Wt 189.1 lb

## 2023-03-02 DIAGNOSIS — C50911 Malignant neoplasm of unspecified site of right female breast: Secondary | ICD-10-CM | POA: Diagnosis not present

## 2023-03-02 DIAGNOSIS — E559 Vitamin D deficiency, unspecified: Secondary | ICD-10-CM | POA: Diagnosis not present

## 2023-03-02 DIAGNOSIS — Z170 Estrogen receptor positive status [ER+]: Secondary | ICD-10-CM | POA: Insufficient documentation

## 2023-03-02 DIAGNOSIS — Z79811 Long term (current) use of aromatase inhibitors: Secondary | ICD-10-CM | POA: Diagnosis not present

## 2023-03-02 DIAGNOSIS — C50411 Malignant neoplasm of upper-outer quadrant of right female breast: Secondary | ICD-10-CM

## 2023-03-02 LAB — CBC WITH DIFFERENTIAL (CANCER CENTER ONLY)
Abs Immature Granulocytes: 0.02 10*3/uL (ref 0.00–0.07)
Basophils Absolute: 0 10*3/uL (ref 0.0–0.1)
Basophils Relative: 1 %
Eosinophils Absolute: 0.1 10*3/uL (ref 0.0–0.5)
Eosinophils Relative: 2 %
HCT: 37.9 % (ref 36.0–46.0)
Hemoglobin: 12.4 g/dL (ref 12.0–15.0)
Immature Granulocytes: 0 %
Lymphocytes Relative: 39 %
Lymphs Abs: 2.4 10*3/uL (ref 0.7–4.0)
MCH: 30.8 pg (ref 26.0–34.0)
MCHC: 32.7 g/dL (ref 30.0–36.0)
MCV: 94.3 fL (ref 80.0–100.0)
Monocytes Absolute: 0.4 10*3/uL (ref 0.1–1.0)
Monocytes Relative: 6 %
Neutro Abs: 3.3 10*3/uL (ref 1.7–7.7)
Neutrophils Relative %: 52 %
Platelet Count: 298 10*3/uL (ref 150–400)
RBC: 4.02 MIL/uL (ref 3.87–5.11)
RDW: 11.9 % (ref 11.5–15.5)
WBC Count: 6.3 10*3/uL (ref 4.0–10.5)
nRBC: 0 % (ref 0.0–0.2)

## 2023-03-02 LAB — CMP (CANCER CENTER ONLY)
ALT: 23 U/L (ref 0–44)
AST: 20 U/L (ref 15–41)
Albumin: 4.3 g/dL (ref 3.5–5.0)
Alkaline Phosphatase: 75 U/L (ref 38–126)
Anion gap: 9 (ref 5–15)
BUN: 14 mg/dL (ref 6–20)
CO2: 28 mmol/L (ref 22–32)
Calcium: 9.7 mg/dL (ref 8.9–10.3)
Chloride: 107 mmol/L (ref 98–111)
Creatinine: 0.83 mg/dL (ref 0.44–1.00)
GFR, Estimated: >60 mL/min (ref >60)
Glucose, Bld: 104 mg/dL — ABNORMAL HIGH (ref 70–99)
Potassium: 3.8 mmol/L (ref 3.5–5.1)
Sodium: 144 mmol/L (ref 135–145)
Total Bilirubin: 0.5 mg/dL (ref 0.3–1.2)
Total Protein: 7.5 g/dL (ref 6.5–8.1)

## 2023-03-02 LAB — VITAMIN D 25 HYDROXY (VIT D DEFICIENCY, FRACTURES): Vit D, 25-Hydroxy: 70.91 ng/mL (ref 30–100)

## 2023-03-02 NOTE — Progress Notes (Signed)
Hematology and Oncology Follow Up Visit  Katelyn Reyes [id number] September 28, 1960 62 y.o. 03/02/2023   Principle Diagnosis:  Stage IA (T1aN0M0) infiltrating ductal carcinoma of the right breast- ER+/PR+/HER2-   Current Therapy:        Status post bilateral mastectomies --04/26/2021 Arimidex 1 mg p.o. daily --start on 06/29/2021   Interim History:  Katelyn Reyes is here today for follow-up. She is doing quite well and has no complaints at this time.  She is tolerating Arimidex nicely and taking as prescribed. Gabapentin was effective in treating her hot flashes and night sweats.  Bilateral mastectomies with reconstruction intact. No changes per patient.  No adenopathy noted on exam. No lymphedema.  No fever, chills, n/v, cough, rash, dizziness, SOB, chest pain, palpitations, abdominal pain or changes in bowel or bladder habits.  No swelling, tenderness, numbness or tingling in her extremities.  No falls or syncope.  Appetite and hydration are good. Weight is stable at 189 lbs.   ECOG Performance Status: 0 - Asymptomatic  Medications:  Allergies as of 03/02/2023       Reactions   Amoxicillin Rash, Other (See Comments)   Has patient had a PCN reaction causing immediate rash, facial/tongue/throat swelling, SOB or lightheadedness with hypotension: No Has patient had a PCN reaction causing severe rash involving mucus membranes or skin necrosis: No Has patient had a PCN reaction that required treatment: #  #  #  YES  #  #  # - MD office Has patient had a PCN reaction occurring within the last 10 years: Yes If all of the above answers are "NO", then may proceed with Cephalosporin use.   Erythromycin Nausea Only   Hydrocodone-acetaminophen Other (See Comments)   Hallucinations   Penicillin G Rash   Shellfish Allergy Itching, Other (See Comments)   REACTS TO SCALLOPS        Medication List        Accurate as of March 02, 2023  8:47 AM. If you have any questions, ask your nurse or  doctor.          albuterol 108 (90 Base) MCG/ACT inhaler Commonly known as: VENTOLIN HFA Inhale 2 puffs into the lungs every 6 (six) hours as needed for wheezing.   ALPRAZolam 0.5 MG tablet Commonly known as: XANAX Take 1 tablet (0.5 mg total) by mouth 3 (three) times daily as needed.   anastrozole 1 MG tablet Commonly known as: ARIMIDEX Take 1 tablet (1 mg total) by mouth daily.   cetirizine 10 MG tablet Commonly known as: ZYRTEC Take 1 tablet (10 mg total) by mouth daily.   fluticasone 50 MCG/ACT nasal spray Commonly known as: FLONASE Place 2 sprays into both nostrils daily.   gabapentin 300 MG capsule Commonly known as: NEURONTIN Take 2 capsules (600 mg total) by mouth at bedtime.   MAGNESIUM PO Take by mouth as directed. L-threonate 1000 mg nightly   meloxicam 15 MG tablet Commonly known as: MOBIC Take 1 tablet (15 mg total) by mouth daily. What changed: Another medication with the same name was removed. Continue taking this medication, and follow the directions you see here. Changed by: Eileen Stanford   montelukast 10 MG tablet Commonly known as: SINGULAIR Take 1 tablet (10 mg total) by mouth daily.   Mounjaro 10 MG/0.5ML Pen Generic drug: tirzepatide Inject 10 mg into the skin once a week.   pantoprazole 40 MG tablet Commonly known as: PROTONIX Take 1 tablet (40 mg total) by mouth 2 (two) times daily.  sertraline 100 MG tablet Commonly known as: ZOLOFT Take 1 tablet (100 mg total) by mouth daily.   TechLite Pen Needles 32G X 4 MM Misc Generic drug: Insulin Pen Needle Use daily w/ saxenda   tiZANidine 4 MG tablet Commonly known as: Zanaflex Take 1 tablet (4 mg total) by mouth 2 (two) times daily.   Vitamin D-3 125 MCG (5000 UT) Tabs Take 5,000 Units by mouth every evening.   zolpidem 10 MG tablet Commonly known as: AMBIEN Take 1 tablet (10 mg total) by mouth at bedtime as needed for sleep.        Allergies:  Allergies  Allergen Reactions    Amoxicillin Rash and Other (See Comments)    Has patient had a PCN reaction causing immediate rash, facial/tongue/throat swelling, SOB or lightheadedness with hypotension: No Has patient had a PCN reaction causing severe rash involving mucus membranes or skin necrosis: No Has patient had a PCN reaction that required treatment: #  #  #  YES  #  #  # - MD office Has patient had a PCN reaction occurring within the last 10 years: Yes If all of the above answers are "NO", then may proceed with Cephalosporin use.    Erythromycin Nausea Only   Hydrocodone-Acetaminophen Other (See Comments)    Hallucinations   Penicillin G Rash   Shellfish Allergy Itching and Other (See Comments)    REACTS TO SCALLOPS     Past Medical History, Surgical history, Social history, and Family History were reviewed and updated.  Review of Systems: All other 10 point review of systems is negative.   Physical Exam:  height is 5\' 2"  (1.575 m) and weight is 189 lb 1.3 oz (85.8 kg). Her oral temperature is 98.8 F (37.1 C). Her blood pressure is 106/74 and her pulse is 74. Her respiration is 17 and oxygen saturation is 100%.   Wt Readings from Last 3 Encounters:  03/02/23 189 lb 1.3 oz (85.8 kg)  01/19/23 195 lb 8 oz (88.7 kg)  12/16/22 202 lb 2 oz (91.7 kg)    Ocular: Sclerae unicteric, pupils equal, round and reactive to light Ear-nose-throat: Oropharynx clear, dentition fair Lymphatic: No cervical, supraclavicular or axillary adenopathy Lungs no rales or rhonchi, good excursion bilaterally Heart regular rate and rhythm, no murmur appreciated Abd soft, nontender, positive bowel sounds MSK no focal spinal tenderness, no joint edema Neuro: non-focal, well-oriented, appropriate affect Breasts: Same as above  Lab Results  Component Value Date   WBC 6.3 03/02/2023   HGB 12.4 03/02/2023   HCT 37.9 03/02/2023   MCV 94.3 03/02/2023   PLT 298 03/02/2023   No results found for: "FERRITIN", "IRON", "TIBC",  "UIBC", "IRONPCTSAT" Lab Results  Component Value Date   RBC 4.02 03/02/2023   No results found for: "KPAFRELGTCHN", "LAMBDASER", "KAPLAMBRATIO" No results found for: "IGGSERUM", "IGA", "IGMSERUM" No results found for: "TOTALPROTELP", "ALBUMINELP", "A1GS", "A2GS", "BETS", "BETA2SER", "GAMS", "MSPIKE", "SPEI"   Chemistry      Component Value Date/Time   NA 141 01/19/2023 0809   K 4.1 01/19/2023 0809   CL 104 01/19/2023 0809   CO2 29 01/19/2023 0809   BUN 11 01/19/2023 0809   CREATININE 0.93 01/19/2023 0809   CREATININE 0.89 11/03/2022 0737   CREATININE 0.92 04/28/2017 1645      Component Value Date/Time   CALCIUM 9.6 01/19/2023 0809   ALKPHOS 84 01/19/2023 0809   AST 23 01/19/2023 0809   AST 24 11/03/2022 0737   ALT 26 01/19/2023 0809  ALT 25 11/03/2022 0737   BILITOT 0.5 01/19/2023 0809   BILITOT 0.4 11/03/2022 0737       Impression and Plan: Katelyn Reyes is a very pleasant 62 yo postmenopausal African American female with history of very early stage ductal carcinoma of the right breast.  She had bilateral mastectomies in 04/2021 followed by reconstruction and is currently on Arimidex which she has tolerated nicely.  She will continue her same regimen.  Bone density due again Jan 2025.  Follow-up in 4 months.   Eileen Stanford, NP 9/16/20248:47 AM

## 2023-03-04 ENCOUNTER — Other Ambulatory Visit: Payer: Self-pay

## 2023-03-04 MED ORDER — INFLUENZA VIRUS VACC SPLIT PF (FLUZONE) 0.5 ML IM SUSY
0.5000 mL | PREFILLED_SYRINGE | Freq: Once | INTRAMUSCULAR | 0 refills | Status: AC
  Filled 2023-03-04: qty 0.5, 1d supply, fill #0

## 2023-03-04 MED ORDER — COVID-19 MRNA VAC-TRIS(PFIZER) 30 MCG/0.3ML IM SUSY
0.3000 mL | PREFILLED_SYRINGE | Freq: Once | INTRAMUSCULAR | 0 refills | Status: AC
  Filled 2023-03-04: qty 0.3, 1d supply, fill #0

## 2023-03-07 ENCOUNTER — Other Ambulatory Visit (HOSPITAL_COMMUNITY): Payer: Self-pay

## 2023-03-09 ENCOUNTER — Other Ambulatory Visit (HOSPITAL_COMMUNITY): Payer: Self-pay

## 2023-03-16 NOTE — Progress Notes (Signed)
Seen by casting department

## 2023-03-23 ENCOUNTER — Ambulatory Visit: Payer: Commercial Managed Care - PPO

## 2023-03-23 ENCOUNTER — Other Ambulatory Visit (HOSPITAL_BASED_OUTPATIENT_CLINIC_OR_DEPARTMENT_OTHER): Payer: Self-pay

## 2023-03-23 MED ORDER — BOOSTRIX 5-2.5-18.5 LF-MCG/0.5 IM SUSY
0.5000 mL | PREFILLED_SYRINGE | Freq: Once | INTRAMUSCULAR | 0 refills | Status: AC
  Filled 2023-03-23: qty 0.5, 1d supply, fill #0

## 2023-03-27 ENCOUNTER — Other Ambulatory Visit (HOSPITAL_COMMUNITY): Payer: Self-pay

## 2023-03-28 ENCOUNTER — Other Ambulatory Visit (HOSPITAL_COMMUNITY): Payer: Self-pay

## 2023-04-07 ENCOUNTER — Other Ambulatory Visit (HOSPITAL_COMMUNITY): Payer: Self-pay

## 2023-04-08 ENCOUNTER — Other Ambulatory Visit (HOSPITAL_COMMUNITY): Payer: Self-pay

## 2023-04-20 ENCOUNTER — Other Ambulatory Visit (HOSPITAL_COMMUNITY): Payer: Self-pay

## 2023-04-22 ENCOUNTER — Telehealth: Payer: Self-pay | Admitting: Family Medicine

## 2023-04-22 ENCOUNTER — Other Ambulatory Visit (HOSPITAL_COMMUNITY): Payer: Self-pay

## 2023-04-22 MED ORDER — TIRZEPATIDE 15 MG/0.5ML ~~LOC~~ SOAJ
15.0000 mg | SUBCUTANEOUS | 1 refills | Status: DC
Start: 2023-04-22 — End: 2023-07-29
  Filled 2023-04-22: qty 2, 28d supply, fill #0
  Filled 2023-06-05: qty 2, 28d supply, fill #1
  Filled 2023-07-06: qty 2, 28d supply, fill #2

## 2023-04-22 NOTE — Telephone Encounter (Signed)
Pt has been informed.

## 2023-04-22 NOTE — Telephone Encounter (Signed)
Encourage patient to contact the pharmacy for refills or they can request refills through [facility]   WHAT PHARMACY WOULD THEY LIKE THIS SENT TO:  [place] [facility]   MEDICATION NAME & DOSE: tirzepatide (MOUNJARO) 10 MG/0.5ML Pen    NOTES/COMMENTS FROM PATIENT: Pt would like to up dose to 15mg      Front office please notify patient: It takes 48-72 hours to process rx refill requests Ask patient to call pharmacy to ensure rx is ready before heading there.

## 2023-04-22 NOTE — Telephone Encounter (Signed)
 Prescription sent for the higher dose

## 2023-04-24 ENCOUNTER — Other Ambulatory Visit (HOSPITAL_COMMUNITY): Payer: Self-pay

## 2023-04-24 ENCOUNTER — Other Ambulatory Visit: Payer: Self-pay | Admitting: Family Medicine

## 2023-04-24 MED ORDER — SERTRALINE HCL 100 MG PO TABS
100.0000 mg | ORAL_TABLET | Freq: Every day | ORAL | 1 refills | Status: DC
  Filled 2023-04-24: qty 90, 90d supply, fill #0

## 2023-04-27 ENCOUNTER — Other Ambulatory Visit (HOSPITAL_COMMUNITY): Payer: Self-pay

## 2023-05-09 ENCOUNTER — Other Ambulatory Visit (HOSPITAL_COMMUNITY): Payer: Self-pay

## 2023-05-09 ENCOUNTER — Other Ambulatory Visit: Payer: Self-pay | Admitting: Family Medicine

## 2023-05-11 ENCOUNTER — Other Ambulatory Visit (HOSPITAL_COMMUNITY): Payer: Self-pay

## 2023-05-11 ENCOUNTER — Other Ambulatory Visit: Payer: Self-pay

## 2023-05-11 ENCOUNTER — Telehealth: Payer: Self-pay | Admitting: Family Medicine

## 2023-05-11 MED ORDER — ZOLPIDEM TARTRATE 10 MG PO TABS
10.0000 mg | ORAL_TABLET | Freq: Every evening | ORAL | 3 refills | Status: DC | PRN
  Filled 2023-05-11: qty 30, 30d supply, fill #0
  Filled 2023-06-08: qty 30, 30d supply, fill #1
  Filled 2023-07-06 – 2023-07-10 (×2): qty 30, 30d supply, fill #2

## 2023-05-11 NOTE — Telephone Encounter (Signed)
Lat office visit 03/02/2023 Last refill 03/02/2023

## 2023-05-11 NOTE — Telephone Encounter (Signed)
Encourage patient to contact the pharmacy for refills or they can request refills through [facility]  (Please schedule appointment if patient has not been seen in over a year)    WHAT PHARMACY WOULD THEY LIKE THIS SENT TO: [facility]   MEDICATION NAME & DOSE: zolpidem (AMBIEN) 10 MG tablet   NOTES/COMMENTS FROM PATIENT: Pt is completely out of medication     Front office please notify patient: It takes 48-72 hours to process rx refill requests Ask patient to call pharmacy to ensure rx is ready before heading there.

## 2023-05-11 NOTE — Telephone Encounter (Signed)
Refilled 05/11/2023

## 2023-06-05 ENCOUNTER — Other Ambulatory Visit (HOSPITAL_COMMUNITY): Payer: Self-pay

## 2023-06-06 ENCOUNTER — Other Ambulatory Visit (HOSPITAL_COMMUNITY): Payer: Self-pay

## 2023-06-08 ENCOUNTER — Other Ambulatory Visit (HOSPITAL_COMMUNITY): Payer: Self-pay

## 2023-06-08 ENCOUNTER — Other Ambulatory Visit: Payer: Self-pay

## 2023-07-06 ENCOUNTER — Inpatient Hospital Stay (HOSPITAL_BASED_OUTPATIENT_CLINIC_OR_DEPARTMENT_OTHER): Payer: Commercial Managed Care - PPO | Admitting: Family

## 2023-07-06 ENCOUNTER — Inpatient Hospital Stay: Payer: Commercial Managed Care - PPO | Attending: Hematology & Oncology

## 2023-07-06 VITALS — BP 117/74 | HR 78 | Temp 98.2°F | Resp 17 | Ht 62.0 in | Wt 174.0 lb

## 2023-07-06 DIAGNOSIS — C50411 Malignant neoplasm of upper-outer quadrant of right female breast: Secondary | ICD-10-CM

## 2023-07-06 DIAGNOSIS — Z79811 Long term (current) use of aromatase inhibitors: Secondary | ICD-10-CM | POA: Insufficient documentation

## 2023-07-06 DIAGNOSIS — E559 Vitamin D deficiency, unspecified: Secondary | ICD-10-CM | POA: Diagnosis not present

## 2023-07-06 DIAGNOSIS — C50911 Malignant neoplasm of unspecified site of right female breast: Secondary | ICD-10-CM | POA: Diagnosis not present

## 2023-07-06 DIAGNOSIS — Z170 Estrogen receptor positive status [ER+]: Secondary | ICD-10-CM | POA: Diagnosis not present

## 2023-07-06 LAB — CBC WITH DIFFERENTIAL (CANCER CENTER ONLY)
Abs Immature Granulocytes: 0.01 10*3/uL (ref 0.00–0.07)
Basophils Absolute: 0 10*3/uL (ref 0.0–0.1)
Basophils Relative: 0 %
Eosinophils Absolute: 0.2 10*3/uL (ref 0.0–0.5)
Eosinophils Relative: 3 %
HCT: 37.4 % (ref 36.0–46.0)
Hemoglobin: 12.3 g/dL (ref 12.0–15.0)
Immature Granulocytes: 0 %
Lymphocytes Relative: 52 %
Lymphs Abs: 3.2 10*3/uL (ref 0.7–4.0)
MCH: 30.7 pg (ref 26.0–34.0)
MCHC: 32.9 g/dL (ref 30.0–36.0)
MCV: 93.3 fL (ref 80.0–100.0)
Monocytes Absolute: 0.4 10*3/uL (ref 0.1–1.0)
Monocytes Relative: 7 %
Neutro Abs: 2.3 10*3/uL (ref 1.7–7.7)
Neutrophils Relative %: 38 %
Platelet Count: 305 10*3/uL (ref 150–400)
RBC: 4.01 MIL/uL (ref 3.87–5.11)
RDW: 12 % (ref 11.5–15.5)
WBC Count: 6.1 10*3/uL (ref 4.0–10.5)
nRBC: 0 % (ref 0.0–0.2)

## 2023-07-06 LAB — CMP (CANCER CENTER ONLY)
ALT: 21 U/L (ref 0–44)
AST: 20 U/L (ref 15–41)
Albumin: 4.3 g/dL (ref 3.5–5.0)
Alkaline Phosphatase: 82 U/L (ref 38–126)
Anion gap: 7 (ref 5–15)
BUN: 14 mg/dL (ref 6–20)
CO2: 30 mmol/L (ref 22–32)
Calcium: 9.5 mg/dL (ref 8.9–10.3)
Chloride: 106 mmol/L (ref 98–111)
Creatinine: 0.83 mg/dL (ref 0.44–1.00)
GFR, Estimated: >60 mL/min (ref >60)
Glucose, Bld: 99 mg/dL (ref 70–99)
Potassium: 4.4 mmol/L (ref 3.5–5.1)
Sodium: 143 mmol/L (ref 135–145)
Total Bilirubin: 0.5 mg/dL (ref 0.0–1.2)
Total Protein: 7.1 g/dL (ref 6.5–8.1)

## 2023-07-06 LAB — VITAMIN D 25 HYDROXY (VIT D DEFICIENCY, FRACTURES): Vit D, 25-Hydroxy: 46.48 ng/mL (ref 30–100)

## 2023-07-06 NOTE — Progress Notes (Signed)
Hematology and Oncology Follow Up Visit  Katelyn Reyes [id number] [birth year] 63 y.o. 07/06/2023   Principle Diagnosis:  Stage IA (T1aN0M0) infiltrating ductal carcinoma of the right breast- ER+/PR+/HER2-   Current Therapy:        Status post bilateral mastectomies --04/26/2021 Arimidex 1 mg p.o. daily -- started on 06/29/2021   Interim History:  Katelyn Reyes is here today for follow-up. She is doing well and has no complaints outside of generalized aches and pains. She has been to see sports medicine and is working on her stretches.  Bilateral breast exam negative. No mass, lesion or rash noted. No lymphedema or adenopathy noted.  Hot flashes and night sweats with Arimidex controlled nicely with Neurontin.  No fever, chills, n/v, cough, rash, dizziness, SOB, chest pain, palpitations, abdominal pain or changes in bowel or bladder habits.  No swelling, tenderness, numbness or tingling in her extremities.  No falls or syncope. Appetite and hydration are good. Weight is stable at 174 lbs.   ECOG Performance Status: 1 - Symptomatic but completely ambulatory  Medications:  Allergies as of 07/06/2023       Reactions   Amoxicillin Rash, Other (See Comments)   Has patient had a PCN reaction causing immediate rash, facial/tongue/throat swelling, SOB or lightheadedness with hypotension: No Has patient had a PCN reaction causing severe rash involving mucus membranes or skin necrosis: No Has patient had a PCN reaction that required treatment: #  #  #  YES  #  #  # - MD office Has patient had a PCN reaction occurring within the last 10 years: Yes If all of the above answers are "NO", then may proceed with Cephalosporin use.   Erythromycin Nausea Only   Hydrocodone-acetaminophen Other (See Comments)   Hallucinations   Penicillin G Rash   Shellfish Allergy Itching, Other (See Comments)   REACTS TO SCALLOPS        Medication List        Accurate as of July 06, 2023  8:46 AM. If you  have any questions, ask your nurse or doctor.          albuterol 108 (90 Base) MCG/ACT inhaler Commonly known as: VENTOLIN HFA Inhale 2 puffs into the lungs every 6 (six) hours as needed for wheezing.   ALPRAZolam 0.5 MG tablet Commonly known as: XANAX Take 1 tablet (0.5 mg total) by mouth 3 (three) times daily as needed.   anastrozole 1 MG tablet Commonly known as: ARIMIDEX Take 1 tablet (1 mg total) by mouth daily.   cetirizine 10 MG tablet Commonly known as: ZYRTEC Take 1 tablet (10 mg total) by mouth daily.   fluticasone 50 MCG/ACT nasal spray Commonly known as: FLONASE Place 2 sprays into both nostrils daily.   gabapentin 300 MG capsule Commonly known as: NEURONTIN Take 2 capsules (600 mg total) by mouth at bedtime.   MAGNESIUM PO Take by mouth as directed. L-threonate 1000 mg nightly   meloxicam 15 MG tablet Commonly known as: MOBIC Take 1 tablet (15 mg total) by mouth daily.   montelukast 10 MG tablet Commonly known as: SINGULAIR Take 1 tablet (10 mg total) by mouth daily.   Mounjaro 15 MG/0.5ML Pen Generic drug: tirzepatide Inject 15 mg into the skin once a week.   pantoprazole 40 MG tablet Commonly known as: PROTONIX Take 1 tablet (40 mg total) by mouth 2 (two) times daily.   sertraline 100 MG tablet Commonly known as: ZOLOFT Take 1 tablet (100 mg total) by mouth  daily.   TechLite Pen Needles 32G X 4 MM Misc Generic drug: Insulin Pen Needle Use daily w/ saxenda   tiZANidine 4 MG tablet Commonly known as: Zanaflex Take 1 tablet (4 mg total) by mouth 2 (two) times daily.   Vitamin D-3 125 MCG (5000 UT) Tabs Take 5,000 Units by mouth every evening.   zolpidem 10 MG tablet Commonly known as: AMBIEN Take 1 tablet (10 mg total) by mouth at bedtime as needed for sleep.        Allergies:  Allergies  Allergen Reactions   Amoxicillin Rash and Other (See Comments)    Has patient had a PCN reaction causing immediate rash, facial/tongue/throat  swelling, SOB or lightheadedness with hypotension: No Has patient had a PCN reaction causing severe rash involving mucus membranes or skin necrosis: No Has patient had a PCN reaction that required treatment: #  #  #  YES  #  #  # - MD office Has patient had a PCN reaction occurring within the last 10 years: Yes If all of the above answers are "NO", then may proceed with Cephalosporin use.    Erythromycin Nausea Only   Hydrocodone-Acetaminophen Other (See Comments)    Hallucinations   Penicillin G Rash   Shellfish Allergy Itching and Other (See Comments)    REACTS TO SCALLOPS     Past Medical History, Surgical history, Social history, and Family History were reviewed and updated.  Review of Systems: All other 10 point review of systems is negative.   Physical Exam:  height is 5\' 2"  (1.575 m) and weight is 174 lb (78.9 kg). Her oral temperature is 98.2 F (36.8 C). Her blood pressure is 117/74 and her pulse is 78. Her respiration is 17 and oxygen saturation is 100%.   Wt Readings from Last 3 Encounters:  07/06/23 174 lb (78.9 kg)  03/02/23 189 lb 1.3 oz (85.8 kg)  01/19/23 195 lb 8 oz (88.7 kg)    Ocular: Sclerae unicteric, pupils equal, round and reactive to light Ear-nose-throat: Oropharynx clear, dentition fair Lymphatic: No cervical, supraclavicular or axillary adenopathy Lungs no rales or rhonchi, good excursion bilaterally Heart regular rate and rhythm, no murmur appreciated Abd soft, nontender, positive bowel sounds MSK no focal spinal tenderness, no joint edema Neuro: non-focal, well-oriented, appropriate affect Breasts: Same as above.  Lab Results  Component Value Date   WBC 6.1 07/06/2023   HGB 12.3 07/06/2023   HCT 37.4 07/06/2023   MCV 93.3 07/06/2023   PLT 305 07/06/2023   No results found for: "FERRITIN", "IRON", "TIBC", "UIBC", "IRONPCTSAT" Lab Results  Component Value Date   RBC 4.01 07/06/2023   No results found for: "KPAFRELGTCHN", "LAMBDASER",  "KAPLAMBRATIO" No results found for: "IGGSERUM", "IGA", "IGMSERUM" No results found for: "TOTALPROTELP", "ALBUMINELP", "A1GS", "A2GS", "BETS", "BETA2SER", "GAMS", "MSPIKE", "SPEI"   Chemistry      Component Value Date/Time   NA 144 03/02/2023 0812   K 3.8 03/02/2023 0812   CL 107 03/02/2023 0812   CO2 28 03/02/2023 0812   BUN 14 03/02/2023 0812   CREATININE 0.83 03/02/2023 0812   CREATININE 0.92 04/28/2017 1645      Component Value Date/Time   CALCIUM 9.7 03/02/2023 0812   ALKPHOS 75 03/02/2023 0812   AST 20 03/02/2023 0812   ALT 23 03/02/2023 0812   BILITOT 0.5 03/02/2023 0812       Impression and Plan: Ms. Jasa is a very pleasant 63 yo postmenopausal African American female with history of very early stage  ductal carcinoma of the right breast.  She had bilateral mastectomies in 04/2021 followed by reconstruction and is currently on Arimidex which she has tolerated nicely. She will continue her same regimen.  Bone density ordered for in the next week or two.  Follow-up in 6 months.   Eileen Stanford, NP 1/20/20258:46 AM

## 2023-07-07 ENCOUNTER — Other Ambulatory Visit (HOSPITAL_COMMUNITY): Payer: Self-pay

## 2023-07-09 ENCOUNTER — Other Ambulatory Visit (HOSPITAL_COMMUNITY): Payer: Self-pay

## 2023-07-10 ENCOUNTER — Other Ambulatory Visit (HOSPITAL_COMMUNITY): Payer: Self-pay

## 2023-07-10 ENCOUNTER — Other Ambulatory Visit (HOSPITAL_BASED_OUTPATIENT_CLINIC_OR_DEPARTMENT_OTHER): Payer: Self-pay

## 2023-07-10 ENCOUNTER — Other Ambulatory Visit: Payer: Self-pay

## 2023-07-13 ENCOUNTER — Other Ambulatory Visit (HOSPITAL_BASED_OUTPATIENT_CLINIC_OR_DEPARTMENT_OTHER): Payer: Self-pay

## 2023-07-13 ENCOUNTER — Ambulatory Visit (HOSPITAL_BASED_OUTPATIENT_CLINIC_OR_DEPARTMENT_OTHER)
Admission: RE | Admit: 2023-07-13 | Discharge: 2023-07-13 | Disposition: A | Discharge status: Home / Self Care | Payer: Commercial Managed Care - PPO | Source: Ambulatory Visit | Attending: Family | Admitting: Family

## 2023-07-13 DIAGNOSIS — C50411 Malignant neoplasm of upper-outer quadrant of right female breast: Secondary | ICD-10-CM

## 2023-07-20 ENCOUNTER — Ambulatory Visit (HOSPITAL_BASED_OUTPATIENT_CLINIC_OR_DEPARTMENT_OTHER)
Admission: RE | Admit: 2023-07-20 | Discharge: 2023-07-20 | Disposition: A | Discharge status: Home / Self Care | Payer: Commercial Managed Care - PPO | Source: Ambulatory Visit | Attending: Family | Admitting: Family

## 2023-07-20 ENCOUNTER — Encounter: Payer: Self-pay | Admitting: *Deleted

## 2023-07-20 DIAGNOSIS — Z780 Asymptomatic menopausal state: Secondary | ICD-10-CM | POA: Diagnosis not present

## 2023-07-20 DIAGNOSIS — C50411 Malignant neoplasm of upper-outer quadrant of right female breast: Secondary | ICD-10-CM | POA: Diagnosis not present

## 2023-07-23 ENCOUNTER — Encounter: Payer: Commercial Managed Care - PPO | Admitting: Family Medicine

## 2023-07-29 ENCOUNTER — Encounter: Payer: Self-pay | Admitting: Family Medicine

## 2023-07-29 ENCOUNTER — Ambulatory Visit (INDEPENDENT_AMBULATORY_CARE_PROVIDER_SITE_OTHER): Payer: Commercial Managed Care - PPO | Admitting: Family Medicine

## 2023-07-29 ENCOUNTER — Other Ambulatory Visit (HOSPITAL_COMMUNITY): Payer: Self-pay

## 2023-07-29 VITALS — BP 92/64 | HR 83 | Temp 98.3°F | Ht 61.5 in | Wt 169.4 lb

## 2023-07-29 DIAGNOSIS — E559 Vitamin D deficiency, unspecified: Secondary | ICD-10-CM

## 2023-07-29 DIAGNOSIS — Z23 Encounter for immunization: Secondary | ICD-10-CM

## 2023-07-29 DIAGNOSIS — E669 Obesity, unspecified: Secondary | ICD-10-CM

## 2023-07-29 DIAGNOSIS — Z0000 Encounter for general adult medical examination without abnormal findings: Secondary | ICD-10-CM | POA: Diagnosis not present

## 2023-07-29 LAB — LIPID PANEL
Cholesterol: 180 mg/dL (ref 0–200)
HDL: 61.2 mg/dL (ref >39.00)
LDL Cholesterol: 103 mg/dL — ABNORMAL HIGH (ref 0–99)
NonHDL: 118.43
Total CHOL/HDL Ratio: 3
Triglycerides: 77 mg/dL (ref 0.0–149.0)
VLDL: 15.4 mg/dL (ref 0.0–40.0)

## 2023-07-29 LAB — CBC WITH DIFFERENTIAL/PLATELET
Basophils Absolute: 0 10*3/uL (ref 0.0–0.1)
Basophils Relative: 0.5 % (ref 0.0–3.0)
Eosinophils Absolute: 0.1 10*3/uL (ref 0.0–0.7)
Eosinophils Relative: 1.9 % (ref 0.0–5.0)
HCT: 38.5 % (ref 36.0–46.0)
Hemoglobin: 12.7 g/dL (ref 12.0–15.0)
Lymphocytes Relative: 51.6 % — ABNORMAL HIGH (ref 12.0–46.0)
Lymphs Abs: 3.2 10*3/uL (ref 0.7–4.0)
MCHC: 32.9 g/dL (ref 30.0–36.0)
MCV: 93 fL (ref 78.0–100.0)
Monocytes Absolute: 0.5 10*3/uL (ref 0.1–1.0)
Monocytes Relative: 7.7 % (ref 3.0–12.0)
Neutro Abs: 2.4 10*3/uL (ref 1.4–7.7)
Neutrophils Relative %: 38.3 % — ABNORMAL LOW (ref 43.0–77.0)
Platelets: 320 10*3/uL (ref 150.0–400.0)
RBC: 4.14 Mil/uL (ref 3.87–5.11)
RDW: 13 % (ref 11.5–15.5)
WBC: 6.2 10*3/uL (ref 4.0–10.5)

## 2023-07-29 LAB — HEPATIC FUNCTION PANEL
ALT: 22 U/L (ref 0–35)
AST: 24 U/L (ref 0–37)
Albumin: 4.2 g/dL (ref 3.5–5.2)
Alkaline Phosphatase: 79 U/L (ref 39–117)
Bilirubin, Direct: 0.1 mg/dL (ref 0.0–0.3)
Total Bilirubin: 0.5 mg/dL (ref 0.2–1.2)
Total Protein: 7.3 g/dL (ref 6.0–8.3)

## 2023-07-29 LAB — BASIC METABOLIC PANEL
BUN: 15 mg/dL (ref 6–23)
CO2: 30 meq/L (ref 19–32)
Calcium: 9.4 mg/dL (ref 8.4–10.5)
Chloride: 105 meq/L (ref 96–112)
Creatinine, Ser: 0.94 mg/dL (ref 0.40–1.20)
GFR: 66.12 mL/min (ref >60.00)
Glucose, Bld: 90 mg/dL (ref 70–99)
Potassium: 4.6 meq/L (ref 3.5–5.1)
Sodium: 141 meq/L (ref 135–145)

## 2023-07-29 LAB — TSH: TSH: 2.32 u[IU]/mL (ref 0.35–5.50)

## 2023-07-29 LAB — VITAMIN D 25 HYDROXY (VIT D DEFICIENCY, FRACTURES): VITD: 42.09 ng/mL (ref 30.00–100.00)

## 2023-07-29 LAB — HEMOGLOBIN A1C: Hgb A1c MFr Bld: 5.2 % (ref 4.6–6.5)

## 2023-07-29 MED ORDER — TIRZEPATIDE 15 MG/0.5ML ~~LOC~~ SOAJ
15.0000 mg | SUBCUTANEOUS | 1 refills | Status: DC
  Filled 2023-07-29: qty 6, 84d supply, fill #0
  Filled 2023-08-11: qty 2, 28d supply, fill #0
  Filled 2023-09-16: qty 2, 28d supply, fill #1
  Filled 2023-10-22: qty 2, 28d supply, fill #2
  Filled 2023-12-03: qty 2, 28d supply, fill #3
  Filled 2024-01-20: qty 2, 28d supply, fill #4

## 2023-07-29 MED ORDER — ALPRAZOLAM 0.5 MG PO TABS
0.5000 mg | ORAL_TABLET | Freq: Three times a day (TID) | ORAL | 3 refills | Status: DC | PRN
  Filled 2023-07-29: qty 60, 20d supply, fill #0

## 2023-07-29 MED ORDER — ZOLPIDEM TARTRATE 10 MG PO TABS
10.0000 mg | ORAL_TABLET | Freq: Every evening | ORAL | 3 refills | Status: DC | PRN
  Filled 2023-07-29 – 2023-08-11 (×2): qty 30, 30d supply, fill #0
  Filled 2023-09-16: qty 30, 30d supply, fill #1
  Filled 2023-10-17: qty 30, 30d supply, fill #2

## 2023-07-29 MED ORDER — SERTRALINE HCL 100 MG PO TABS
100.0000 mg | ORAL_TABLET | Freq: Every day | ORAL | 1 refills | Status: DC
  Filled 2023-07-29: qty 90, 90d supply, fill #0
  Filled 2023-11-26: qty 90, 90d supply, fill #1

## 2023-07-29 NOTE — Assessment & Plan Note (Signed)
Improving.  Pt has lost 25 lbs since August.  Applauded her efforts.  Check labs to risk stratify.

## 2023-07-29 NOTE — Progress Notes (Signed)
   Subjective:    Patient ID: Katelyn Reyes, female    DOB: [birth year], 63 y.o.   MRN: [id number]  HPI CPE- UTD on colonoscopy, pap, mammo, Tdap, flu.    Patient Care Team    Relationship Specialty Notifications Start End  Sheliah Hatch, MD PCP - General   05/29/10    Comment: Carlyon Shadow, MD Consulting Physician Gastroenterology  07/17/20   Josph Macho, MD Medical Oncologist Oncology  04/02/21   Redefined for Her    07/29/23     Health Maintenance  Topic Date Due   HIV Screening  Never done   Hepatitis C Screening  Never done   Pneumococcal Vaccine 25-4 Years old (2 of 2 - PCV) 03/22/2014   Cervical Cancer Screening (HPV/Pap Cotest)  01/19/2022   COVID-19 Vaccine (7 - 2024-25 season) 04/29/2023   Colonoscopy  07/17/2026   DTaP/Tdap/Td (3 - Td or Tdap) 03/22/2033   INFLUENZA VACCINE  Completed   Zoster Vaccines- Shingrix  Completed   HPV VACCINES  Aged Out      Review of Systems Patient reports no vision/ hearing changes, adenopathy,fever,  persistant/recurrent hoarseness , swallowing issues, chest pain, palpitations, edema, persistant/recurrent cough, hemoptysis, dyspnea (rest/exertional/paroxysmal nocturnal), gastrointestinal bleeding (melena, rectal bleeding), abdominal pain, significant heartburn, bowel changes, GU symptoms (dysuria, hematuria, incontinence), Gyn symptoms (abnormal  bleeding, pain),  syncope, focal weakness, memory loss, numbness & tingling, skin/hair/nail changes, abnormal bruising or bleeding, anxiety, or depression.   + 25 lb weight loss    Objective:   Physical Exam General Appearance:    Alert, cooperative, no distress, appears stated age  Head:    Normocephalic, without obvious abnormality, atraumatic  Eyes:    PERRL, conjunctiva/corneas clear, EOM's intact both eyes  Ears:    Normal TM's and external ear canals, both ears  Nose:   Nares normal, septum midline, mucosa normal, no drainage    or sinus tenderness  Throat:   Lips,  mucosa, and tongue normal; teeth and gums normal  Neck:   Supple, symmetrical, trachea midline, no adenopathy;    Thyroid: no enlargement/tenderness/nodules  Back:     Symmetric, no curvature, ROM normal, no CVA tenderness  Lungs:     Clear to auscultation bilaterally, respirations unlabored  Chest Wall:    No tenderness or deformity   Heart:    Regular rate and rhythm, S1 and S2 normal, no murmur, rub   or gallop  Breast Exam:    Deferred to GYN  Abdomen:     Soft, non-tender, bowel sounds active all four quadrants,    no masses, no organomegaly  Genitalia:    Deferred to GYN  Rectal:    Extremities:   Extremities normal, atraumatic, no cyanosis or edema  Pulses:   2+ and symmetric all extremities  Skin:   Skin color, texture, turgor normal, no rashes or lesions  Lymph nodes:   Cervical, supraclavicular, and axillary nodes normal  Neurologic:   CNII-XII intact, normal strength, sensation and reflexes    throughout          Assessment & Plan:

## 2023-07-29 NOTE — Assessment & Plan Note (Signed)
Pt's PE WNL w/ exception of BMI.  UTD on pap, mammo, colonoscopy, Tdap, flu.  PNA vaccine given.  Check labs.  Anticipatory guidance provided.

## 2023-07-29 NOTE — Patient Instructions (Signed)
Follow up in 6 months to recheck weight loss progress We'll notify you of your lab results and make any changes if needed Continue to work on healthy diet and regular exercise- you look great! Call with any questions or concerns Stay Safe!  Stay Healthy! YOU. LOOK. GREAT!

## 2023-07-30 ENCOUNTER — Encounter: Payer: Self-pay | Admitting: Family Medicine

## 2023-08-03 ENCOUNTER — Other Ambulatory Visit (HOSPITAL_COMMUNITY): Payer: Self-pay

## 2023-08-03 ENCOUNTER — Encounter: Payer: Self-pay | Admitting: Family Medicine

## 2023-08-06 ENCOUNTER — Encounter: Payer: Self-pay | Admitting: Family Medicine

## 2023-08-11 ENCOUNTER — Other Ambulatory Visit (HOSPITAL_COMMUNITY): Payer: Self-pay

## 2023-08-11 ENCOUNTER — Other Ambulatory Visit: Payer: Self-pay

## 2023-08-13 ENCOUNTER — Other Ambulatory Visit: Payer: Self-pay | Admitting: Hematology & Oncology

## 2023-08-13 ENCOUNTER — Other Ambulatory Visit (HOSPITAL_COMMUNITY): Payer: Self-pay

## 2023-08-13 MED ORDER — GABAPENTIN 300 MG PO CAPS
600.0000 mg | ORAL_CAPSULE | Freq: Every day | ORAL | 4 refills | Status: DC
  Filled 2023-08-13: qty 60, 30d supply, fill #0
  Filled 2023-09-16: qty 60, 30d supply, fill #1
  Filled 2023-10-17: qty 60, 30d supply, fill #2
  Filled 2023-11-19: qty 60, 30d supply, fill #3
  Filled 2023-12-22: qty 60, 30d supply, fill #4

## 2023-09-16 ENCOUNTER — Other Ambulatory Visit (HOSPITAL_COMMUNITY): Payer: Self-pay

## 2023-09-16 ENCOUNTER — Other Ambulatory Visit: Payer: Self-pay | Admitting: Family Medicine

## 2023-09-16 ENCOUNTER — Other Ambulatory Visit: Payer: Self-pay

## 2023-09-16 MED ORDER — MONTELUKAST SODIUM 10 MG PO TABS
10.0000 mg | ORAL_TABLET | Freq: Every day | ORAL | 1 refills | Status: DC
  Filled 2023-09-16: qty 90, 90d supply, fill #0
  Filled 2024-01-11: qty 90, 90d supply, fill #1

## 2023-10-04 ENCOUNTER — Inpatient Hospital Stay (HOSPITAL_COMMUNITY)
Admission: EM | Admit: 2023-10-04 | Discharge: 2023-10-06 | DRG: 392 | Disposition: A | Discharge status: Home / Self Care | Attending: Internal Medicine | Admitting: Internal Medicine

## 2023-10-04 ENCOUNTER — Encounter (HOSPITAL_COMMUNITY): Payer: Self-pay | Admitting: Emergency Medicine

## 2023-10-04 ENCOUNTER — Emergency Department (HOSPITAL_COMMUNITY)

## 2023-10-04 ENCOUNTER — Other Ambulatory Visit: Payer: Self-pay

## 2023-10-04 DIAGNOSIS — Z8249 Family history of ischemic heart disease and other diseases of the circulatory system: Secondary | ICD-10-CM

## 2023-10-04 DIAGNOSIS — Z79811 Long term (current) use of aromatase inhibitors: Secondary | ICD-10-CM

## 2023-10-04 DIAGNOSIS — Z9049 Acquired absence of other specified parts of digestive tract: Secondary | ICD-10-CM

## 2023-10-04 DIAGNOSIS — R55 Syncope and collapse: Secondary | ICD-10-CM

## 2023-10-04 DIAGNOSIS — F32A Depression, unspecified: Secondary | ICD-10-CM | POA: Diagnosis present

## 2023-10-04 DIAGNOSIS — Z853 Personal history of malignant neoplasm of breast: Secondary | ICD-10-CM

## 2023-10-04 DIAGNOSIS — R42 Dizziness and giddiness: Secondary | ICD-10-CM

## 2023-10-04 DIAGNOSIS — J45909 Unspecified asthma, uncomplicated: Secondary | ICD-10-CM | POA: Diagnosis present

## 2023-10-04 DIAGNOSIS — Z8041 Family history of malignant neoplasm of ovary: Secondary | ICD-10-CM

## 2023-10-04 DIAGNOSIS — E669 Obesity, unspecified: Secondary | ICD-10-CM | POA: Diagnosis present

## 2023-10-04 DIAGNOSIS — Z833 Family history of diabetes mellitus: Secondary | ICD-10-CM

## 2023-10-04 DIAGNOSIS — Z800 Family history of malignant neoplasm of digestive organs: Secondary | ICD-10-CM

## 2023-10-04 DIAGNOSIS — K219 Gastro-esophageal reflux disease without esophagitis: Secondary | ICD-10-CM | POA: Diagnosis present

## 2023-10-04 DIAGNOSIS — F419 Anxiety disorder, unspecified: Secondary | ICD-10-CM | POA: Diagnosis present

## 2023-10-04 DIAGNOSIS — Z885 Allergy status to narcotic agent status: Secondary | ICD-10-CM

## 2023-10-04 DIAGNOSIS — E119 Type 2 diabetes mellitus without complications: Secondary | ICD-10-CM | POA: Diagnosis present

## 2023-10-04 DIAGNOSIS — R112 Nausea with vomiting, unspecified: Principal | ICD-10-CM

## 2023-10-04 DIAGNOSIS — A059 Bacterial foodborne intoxication, unspecified: Principal | ICD-10-CM | POA: Diagnosis present

## 2023-10-04 DIAGNOSIS — Z9013 Acquired absence of bilateral breasts and nipples: Secondary | ICD-10-CM

## 2023-10-04 DIAGNOSIS — Z791 Long term (current) use of non-steroidal anti-inflammatories (NSAID): Secondary | ICD-10-CM

## 2023-10-04 DIAGNOSIS — Z7985 Long-term (current) use of injectable non-insulin antidiabetic drugs: Secondary | ICD-10-CM

## 2023-10-04 DIAGNOSIS — E785 Hyperlipidemia, unspecified: Secondary | ICD-10-CM | POA: Diagnosis present

## 2023-10-04 DIAGNOSIS — E876 Hypokalemia: Secondary | ICD-10-CM | POA: Diagnosis present

## 2023-10-04 DIAGNOSIS — Z803 Family history of malignant neoplasm of breast: Secondary | ICD-10-CM

## 2023-10-04 DIAGNOSIS — Z880 Allergy status to penicillin: Secondary | ICD-10-CM

## 2023-10-04 DIAGNOSIS — Z91013 Allergy to seafood: Secondary | ICD-10-CM

## 2023-10-04 DIAGNOSIS — Z881 Allergy status to other antibiotic agents status: Secondary | ICD-10-CM

## 2023-10-04 DIAGNOSIS — Z823 Family history of stroke: Secondary | ICD-10-CM

## 2023-10-04 HISTORY — DX: Nausea with vomiting, unspecified: R11.2

## 2023-10-04 LAB — COMPREHENSIVE METABOLIC PANEL WITH GFR
ALT: 21 U/L (ref 0–44)
AST: 24 U/L (ref 15–41)
Albumin: 4.3 g/dL (ref 3.5–5.0)
Alkaline Phosphatase: 62 U/L (ref 38–126)
Anion gap: 8 (ref 5–15)
BUN: 16 mg/dL (ref 6–20)
CO2: 25 mmol/L (ref 22–32)
Calcium: 9.6 mg/dL (ref 8.9–10.3)
Chloride: 108 mmol/L (ref 98–111)
Creatinine, Ser: 0.86 mg/dL (ref 0.44–1.00)
GFR, Estimated: >60 mL/min (ref >60)
Glucose, Bld: 97 mg/dL (ref 70–99)
Potassium: 3.4 mmol/L — ABNORMAL LOW (ref 3.5–5.1)
Sodium: 141 mmol/L (ref 135–145)
Total Bilirubin: 0.7 mg/dL (ref 0.0–1.2)
Total Protein: 7.7 g/dL (ref 6.5–8.1)

## 2023-10-04 LAB — URINALYSIS, W/ REFLEX TO CULTURE (INFECTION SUSPECTED)
Bilirubin Urine: NEGATIVE
Glucose, UA: NEGATIVE mg/dL
Ketones, ur: 5 mg/dL — AB
Nitrite: NEGATIVE
Protein, ur: 30 mg/dL — AB
Specific Gravity, Urine: 1.029 (ref 1.005–1.030)
pH: 5 (ref 5.0–8.0)

## 2023-10-04 LAB — CBC WITH DIFFERENTIAL/PLATELET
Abs Immature Granulocytes: 0.03 10*3/uL (ref 0.00–0.07)
Basophils Absolute: 0 10*3/uL (ref 0.0–0.1)
Basophils Relative: 0 %
Eosinophils Absolute: 0 10*3/uL (ref 0.0–0.5)
Eosinophils Relative: 0 %
HCT: 38.8 % (ref 36.0–46.0)
Hemoglobin: 12.6 g/dL (ref 12.0–15.0)
Immature Granulocytes: 0 %
Lymphocytes Relative: 13 %
Lymphs Abs: 1 10*3/uL (ref 0.7–4.0)
MCH: 31 pg (ref 26.0–34.0)
MCHC: 32.5 g/dL (ref 30.0–36.0)
MCV: 95.6 fL (ref 80.0–100.0)
Monocytes Absolute: 0.6 10*3/uL (ref 0.1–1.0)
Monocytes Relative: 7 %
Neutro Abs: 6.2 10*3/uL (ref 1.7–7.7)
Neutrophils Relative %: 80 %
Platelets: 302 10*3/uL (ref 150–400)
RBC: 4.06 MIL/uL (ref 3.87–5.11)
RDW: 12.1 % (ref 11.5–15.5)
WBC: 7.9 10*3/uL (ref 4.0–10.5)
nRBC: 0 % (ref 0.0–0.2)

## 2023-10-04 LAB — TROPONIN I (HIGH SENSITIVITY)
Troponin I (High Sensitivity): 7 ng/L (ref <18)
Troponin I (High Sensitivity): 15 ng/L (ref <18)

## 2023-10-04 LAB — CK: Total CK: 146 U/L (ref 38–234)

## 2023-10-04 LAB — CBG MONITORING, ED: Glucose-Capillary: 89 mg/dL (ref 70–99)

## 2023-10-04 LAB — LIPASE, BLOOD: Lipase: 33 U/L (ref 11–51)

## 2023-10-04 MED ORDER — SODIUM CHLORIDE 0.9 % IV BOLUS
500.0000 mL | Freq: Once | INTRAVENOUS | Status: AC
  Administered 2023-10-04: 500 mL via INTRAVENOUS

## 2023-10-04 MED ORDER — IOHEXOL 300 MG/ML  SOLN
100.0000 mL | Freq: Once | INTRAMUSCULAR | Status: AC | PRN
  Administered 2023-10-04: 100 mL via INTRAVENOUS

## 2023-10-04 MED ORDER — ONDANSETRON HCL 4 MG/2ML IJ SOLN
4.0000 mg | Freq: Once | INTRAMUSCULAR | Status: AC
  Administered 2023-10-04: 4 mg via INTRAVENOUS
  Filled 2023-10-04: qty 2

## 2023-10-04 MED ORDER — MORPHINE SULFATE (PF) 4 MG/ML IV SOLN
4.0000 mg | Freq: Once | INTRAVENOUS | Status: AC
  Administered 2023-10-04: 4 mg via INTRAVENOUS
  Filled 2023-10-04: qty 1

## 2023-10-04 MED ORDER — SERTRALINE HCL 100 MG PO TABS
100.0000 mg | ORAL_TABLET | Freq: Every day | ORAL | Status: DC
  Administered 2023-10-05 (×2): 100 mg via ORAL
  Filled 2023-10-04: qty 2
  Filled 2023-10-04: qty 1

## 2023-10-04 MED ORDER — MELATONIN 5 MG PO TABS: 5.0000 mg | ORAL_TABLET | Freq: Every evening | ORAL | Status: DC | PRN

## 2023-10-04 MED ORDER — GABAPENTIN 300 MG PO CAPS
600.0000 mg | ORAL_CAPSULE | Freq: Every day | ORAL | Status: DC
  Administered 2023-10-05 (×2): 600 mg via ORAL
  Filled 2023-10-04 (×2): qty 2

## 2023-10-04 MED ORDER — LACTATED RINGERS IV SOLN: INTRAVENOUS | Status: AC

## 2023-10-04 MED ORDER — PANTOPRAZOLE SODIUM 40 MG IV SOLR
40.0000 mg | Freq: Once | INTRAVENOUS | Status: AC
  Administered 2023-10-04: 40 mg via INTRAVENOUS
  Filled 2023-10-04: qty 10

## 2023-10-04 MED ORDER — ANASTROZOLE 1 MG PO TABS
1.0000 mg | ORAL_TABLET | Freq: Every day | ORAL | Status: DC
  Administered 2023-10-05 – 2023-10-06 (×2): 1 mg via ORAL
  Filled 2023-10-04 (×3): qty 1

## 2023-10-04 MED ORDER — MORPHINE SULFATE (PF) 4 MG/ML IV SOLN
4.0000 mg | Freq: Once | INTRAVENOUS | Status: AC
  Administered 2023-10-05: 4 mg via INTRAVENOUS
  Filled 2023-10-04: qty 1

## 2023-10-04 MED ORDER — SODIUM CHLORIDE 0.9 % IV BOLUS
1000.0000 mL | Freq: Once | INTRAVENOUS | Status: AC
  Administered 2023-10-04: 1000 mL via INTRAVENOUS

## 2023-10-04 MED ORDER — ALBUTEROL SULFATE (2.5 MG/3ML) 0.083% IN NEBU: 3.0000 mL | INHALATION_SOLUTION | Freq: Four times a day (QID) | RESPIRATORY_TRACT | Status: DC | PRN

## 2023-10-04 MED ORDER — MONTELUKAST SODIUM 10 MG PO TABS
10.0000 mg | ORAL_TABLET | Freq: Every day | ORAL | Status: DC
  Administered 2023-10-05 – 2023-10-06 (×2): 10 mg via ORAL
  Filled 2023-10-04 (×3): qty 1

## 2023-10-04 MED ORDER — ACETAMINOPHEN 325 MG PO TABS: 650.0000 mg | ORAL_TABLET | Freq: Four times a day (QID) | ORAL | Status: DC | PRN

## 2023-10-04 MED ORDER — POLYETHYLENE GLYCOL 3350 17 G PO PACK: 17.0000 g | PACK | Freq: Every day | ORAL | Status: DC | PRN

## 2023-10-04 MED ORDER — SODIUM CHLORIDE 0.9 % IV SOLN
12.5000 mg | Freq: Four times a day (QID) | INTRAVENOUS | Status: DC | PRN
  Administered 2023-10-04 – 2023-10-05 (×2): 12.5 mg via INTRAVENOUS
  Filled 2023-10-04 (×2): qty 12.5

## 2023-10-04 NOTE — ED Notes (Signed)
 Pt was able to ambulate to restroom. Pt provided urine sample.

## 2023-10-04 NOTE — ED Notes (Signed)
 Patient transported to CT

## 2023-10-04 NOTE — ED Provider Notes (Signed)
 [place] [facility] AT [facility] Provider Note   CSN: [id number] Arrival date & time: 10/04/23  1503     History  Chief Complaint  Patient presents with   Emesis    Katelyn Reyes is a 63 y.o. female with past medical history of HLD, asthma, NASH, breast cancer, GERD, prediabetes presents emergency department for evaluation of presyncope and vomiting.  She reports that she felt light headed and vomited 10 to 15 minutes following eating at 1400 today.  She then attempted to have a BM and felt presyncopal.  She had had a syncopal episode 3 years ago when she was attempting to have a BM.  She had no complaints prior today.  No chest pain, shortness of breath, headache, visual disturbances, weakness, paresthesia, fevers.   Emesis Associated symptoms: no abdominal pain, no chills, no cough, no diarrhea, no fever and no headaches       Home Medications Prior to Admission medications   Medication Sig Start Date End Date Taking? Authorizing Provider  albuterol  (VENTOLIN  HFA) 108 (90 Base) MCG/ACT inhaler Inhale 2 puffs into the lungs every 6 (six) hours as needed for wheezing. 07/21/22   Tabori, Katherine E, MD  ALPRAZolam  (XANAX ) 0.5 MG tablet Take 1 tablet (0.5 mg total) by mouth 3 (three) times daily as needed. 07/29/23   Tabori, Katherine E, MD  anastrozole  (ARIMIDEX ) 1 MG tablet Take 1 tablet (1 mg total) by mouth daily. 10/30/22   Ivor Mars, MD  cetirizine  (ZYRTEC ) 10 MG tablet Take 1 tablet (10 mg total) by mouth daily. 01/08/22   Jess Morita, MD  Cholecalciferol  (VITAMIN D -3) 125 MCG (5000 UT) TABS Take 5,000 Units by mouth every evening.    [provider]  fluticasone  (FLONASE ) 50 MCG/ACT nasal spray Place 2 sprays into both nostrils daily. 07/21/22   Jess Morita, MD  gabapentin  (NEURONTIN ) 300 MG capsule Take 2 capsules (600 mg total) by mouth at bedtime. 08/13/23   Ivor Mars, MD  Insulin  Pen Needle 32G X 4 MM MISC Use  daily w/ saxenda  01/23/22   Tabori, Katherine E, MD  MAGNESIUM PO Take by mouth as directed. L-threonate 1000 mg nightly    [provider]  meloxicam  (MOBIC ) 15 MG tablet Take 1 tablet (15 mg total) by mouth daily. 08/27/22   Brandt Cake, DPM  montelukast  (SINGULAIR ) 10 MG tablet Take 1 tablet (10 mg total) by mouth daily. 09/16/23   Jess Morita, MD  pantoprazole  (PROTONIX ) 40 MG tablet Take 1 tablet (40 mg total) by mouth 2 (two) times daily. 07/21/22   Tabori, Katherine E, MD  sertraline  (ZOLOFT ) 100 MG tablet Take 1 tablet (100 mg total) by mouth daily. 07/29/23   Tabori, Katherine E, MD  tirzepatide  (MOUNJARO ) 15 MG/0.5ML Pen Inject 15 mg into the skin once a week. 07/29/23   Tabori, Katherine E, MD  tiZANidine  (ZANAFLEX ) 4 MG tablet Take 1 tablet (4 mg total) by mouth 2 (two) times daily. 09/22/22     zolpidem  (AMBIEN ) 10 MG tablet Take 1 tablet (10 mg total) by mouth at bedtime as needed for sleep. 07/29/23   Tabori, Katherine E, MD      Allergies    Amoxicillin , Erythromycin, Hydrocodone-acetaminophen , Penicillin g, and Shellfish allergy    Review of Systems   Review of Systems  Constitutional:  Negative for chills, fatigue and fever.  Respiratory:  Negative for cough, chest tightness, shortness of breath and wheezing.   Cardiovascular:  Negative for chest pain and palpitations.  Gastrointestinal:  Positive for vomiting. Negative for abdominal pain, constipation, diarrhea and nausea.  Neurological:  Negative for dizziness, seizures, weakness, light-headedness, numbness and headaches.    Physical Exam Updated Vital Signs BP 113/83 (BP Location: Right Arm)   Pulse 85   Temp 98.1 F (36.7 C) (Oral)   Resp 14   LMP  (LMP Unknown)   SpO2 100%  Physical Exam Vitals and nursing note reviewed.  Constitutional:      General: She is not in acute distress.    Appearance: Normal appearance. She is not ill-appearing.  HENT:     Head: Normocephalic and atraumatic.     Right  Ear: Tympanic membrane, ear canal and external ear normal.     Left Ear: Tympanic membrane, ear canal and external ear normal.     Mouth/Throat:     Mouth: Mucous membranes are moist.     Pharynx: No oropharyngeal exudate or posterior oropharyngeal erythema.     Comments: Uvula midline. No abscess, fluctuance, erythema noted in mouth Eyes:     General: Lids are normal. Vision grossly intact. No scleral icterus.       Right eye: No discharge.        Left eye: No discharge.     Extraocular Movements:     Right eye: Normal extraocular motion and no nystagmus.     Left eye: Normal extraocular motion and no nystagmus.     Conjunctiva/sclera: Conjunctivae normal.  Neck:     Comments: No meningismus Cardiovascular:     Rate and Rhythm: Normal rate.     Pulses: Normal pulses.  Pulmonary:     Effort: Pulmonary effort is normal. No respiratory distress.     Breath sounds: Normal breath sounds. No stridor. No wheezing or rhonchi.  Chest:     Chest wall: No tenderness.  Abdominal:     General: There is no distension.     Palpations: Abdomen is soft. There is no mass.     Tenderness: There is no abdominal tenderness. There is no guarding.  Musculoskeletal:     Cervical back: Normal range of motion and neck supple. No rigidity or tenderness.     Right lower leg: No edema.     Left lower leg: No edema.  Lymphadenopathy:     Cervical: No cervical adenopathy.  Skin:    General: Skin is warm.     Capillary Refill: Capillary refill takes less than 2 seconds.     Coloration: Skin is not jaundiced or pale.  Neurological:     General: No focal deficit present.     Mental Status: She is alert and oriented to person, place, and time. Mental status is at baseline.     Cranial Nerves: No cranial nerve deficit.     Sensory: No sensory deficit.     Motor: No weakness.     Coordination: Coordination normal.     Gait: Gait normal.     Deep Tendon Reflexes: Reflexes normal.     ED Results /  Procedures / Treatments   Labs (all labs ordered are listed, but only abnormal results are displayed) Labs Reviewed  COMPREHENSIVE METABOLIC PANEL WITH GFR - Abnormal; Notable for the following components:      Result Value   Potassium 3.4 (*)    All other components within normal limits  URINALYSIS, W/ REFLEX TO CULTURE (INFECTION SUSPECTED) - Abnormal; Notable for the following components:   APPearance HAZY (*)  Hgb urine dipstick SMALL (*)    Ketones, ur 5 (*)    Protein, ur 30 (*)    Leukocytes,Ua MODERATE (*)    Bacteria, UA RARE (*)    All other components within normal limits  CBC WITH DIFFERENTIAL/PLATELET  LIPASE, BLOOD  CK  CBG MONITORING, ED  TROPONIN I (HIGH SENSITIVITY)  TROPONIN I (HIGH SENSITIVITY)    EKG None  Radiology No results found.  Procedures Procedures    Medications Ordered in ED Medications  sodium chloride  0.9 % bolus 1,000 mL (1,000 mLs Intravenous New Bag/Given 10/04/23 1850)  pantoprazole  (PROTONIX ) injection 40 mg (40 mg Intravenous Given 10/04/23 1924)    ED Course/ Medical Decision Making/ A&P                                 Medical Decision Making Risk Prescription drug management.   Patient presents to the ED for concern of presyncope, nausea, vomiting, this involves an extensive number of treatment options, and is a complaint that carries with it a high risk of complications and morbidity.  The differential diagnosis includes infection, dehydration, vasovagal syncope, vertigo, electrolyte abnormality, ACS, UTI. Not exhaustive list   Co morbidities that complicate the patient evaluation  See HPI   Additional history obtained:  Additional history obtained from Nursing   External records from outside source obtained and reviewed including triage RN note   Lab Tests:  I Ordered, and personally interpreted labs.  The pertinent results include:   Potassium 3.4    Cardiac Monitoring:  The patient was maintained on a  cardiac monitor.  I personally viewed and interpreted the cardiac monitored which showed an underlying rhythm of: NSR with no STE nor ischemia   Medicines ordered and prescription drug management:  I ordered medication including IVF  for hydration  Reevaluation of the patient after these medicines showed that the patient improved I have reviewed the patients home medicines and have made adjustments as needed   Test Considered:  CXR, CT     Problem List / ED Course:  Presyncope NV Sound vasovagal in nature Lab work all unremarkable.  Troponin negative x 2.  UA contaminated however patient is not having urinary symptoms so UTI is unlikely Neurologically intact. Low suspicion for TIA,CVA Offered CXR and CT head however patient refuses at this time.  She reports that she feels much better following a nap, IVF. I think this is reasonable with reassuring PE Provide IVF for hydration and obtained orthostatic vital signs. No orthostasis Ambulated without difficulty   Reevaluation:  After the interventions noted above, I reevaluated the patient and found that they have :resolved    Dispostion:  After consideration of the diagnostic results and the patients response to treatment, I feel that the patent would benefit from outpatient management and PCP follow-up.   Discussed ED workup, disposition, return to ED precautions with patient who expresses understanding agrees with plan.  All questions answered to their satisfaction.  They are agreeable to plan.  Discharge instructions provided on paperwork  Final Clinical Impression(s) / ED Diagnoses Final diagnoses:  Nausea and vomiting, unspecified vomiting type  Postural dizziness with presyncope    Rx / DC Orders ED Discharge Orders     None         Royann Cords, PA 10/04/23 Mertie Abt    Wynetta Heckle, MD 10/04/23 2307

## 2023-10-04 NOTE — ED Provider Notes (Signed)
 Patient was going to be discharged by previous provider.  Upon discharge she started vomiting.  As needed dose of Zofran  from previous provider was given to patient.  She continued to have emesis.  I assessed patient at bedside.  Actually triage*when she first came to the emergency department.  She is having diffuse abdominal pain and persistent emesis.  No recent head injury.  Has some dizziness worse with ambulation and movement, no headache, numbness, lateral weakness.  Will plan on giving dose of pain management, antiemetic, additional IV fluids as well as CT scan abdomen pelvis. Physical Exam  BP 124/81   Pulse 89   Temp 98.1 F (36.7 C) (Oral)   Resp 19   LMP  (LMP Unknown)   SpO2 100%   Physical Exam Vitals and nursing note reviewed.  Constitutional:      General: She is not in acute distress.    Appearance: She is well-developed. She is not ill-appearing.  HENT:     Head: Atraumatic.  Eyes:     Pupils: Pupils are equal, round, and reactive to light.  Cardiovascular:     Rate and Rhythm: Normal rate.     Pulses: Normal pulses.     Heart sounds: Normal heart sounds.  Pulmonary:     Effort: Pulmonary effort is normal. No respiratory distress.     Breath sounds: Normal breath sounds.  Abdominal:     General: Bowel sounds are normal. There is no distension.     Palpations: Abdomen is soft.     Tenderness: There is abdominal tenderness. There is no right CVA tenderness, left CVA tenderness, guarding or rebound.  Musculoskeletal:        General: Normal range of motion.     Cervical back: Normal range of motion.  Skin:    General: Skin is warm and dry.  Neurological:     General: No focal deficit present.     Mental Status: She is alert.  Psychiatric:        Mood and Affect: Mood normal.     Procedures  Procedures Labs Reviewed  COMPREHENSIVE METABOLIC PANEL WITH GFR - Abnormal; Notable for the following components:      Result Value   Potassium 3.4 (*)    All other  components within normal limits  URINALYSIS, W/ REFLEX TO CULTURE (INFECTION SUSPECTED) - Abnormal; Notable for the following components:   APPearance HAZY (*)    Hgb urine dipstick SMALL (*)    Ketones, ur 5 (*)    Protein, ur 30 (*)    Leukocytes,Ua MODERATE (*)    Bacteria, UA RARE (*)    All other components within normal limits  CBC WITH DIFFERENTIAL/PLATELET  LIPASE, BLOOD  CK  CBG MONITORING, ED  TROPONIN I (HIGH SENSITIVITY)  TROPONIN I (HIGH SENSITIVITY)   CT ABDOMEN PELVIS W CONTRAST Result Date: 10/04/2023 CLINICAL DATA:  Presyncope and vomiting. EXAM: CT ABDOMEN AND PELVIS WITH CONTRAST TECHNIQUE: Multidetector CT imaging of the abdomen and pelvis was performed using the standard protocol following bolus administration of intravenous contrast. RADIATION DOSE REDUCTION: This exam was performed according to the departmental dose-optimization program which includes automated exposure control, adjustment of the mA and/or kV according to patient size and/or use of iterative reconstruction technique. CONTRAST:  OMNIPAQUE  IOHEXOL  300 MG/ML  SOLN COMPARISON:  May 31, 2021 FINDINGS: Lower chest: No acute abnormality. Hepatobiliary: No focal liver abnormality is seen. Status post cholecystectomy. No biliary dilatation. Pancreas: Unremarkable. No pancreatic ductal dilatation or surrounding  inflammatory changes. Spleen: Normal in size without focal abnormality. Adrenals/Urinary Tract: Adrenal glands are unremarkable. Kidneys are normal, without renal calculi, focal lesion, or hydronephrosis. Bladder is unremarkable. Stomach/Bowel: Stomach is within normal limits. Appendix appears normal. No evidence of bowel wall thickening, distention, or inflammatory changes. Vascular/Lymphatic: No significant vascular findings are present. No enlarged abdominal or pelvic lymph nodes. Reproductive: Uterus and bilateral adnexa are unremarkable. Other: No abdominal wall hernia or abnormality. No  abdominopelvic ascites. Musculoskeletal: Bilateral breast implants are noted. No acute or significant osseous findings. IMPRESSION: No acute or active process within the abdomen or pelvis. Electronically Signed   By: Virgle Grime M.D.   On: 10/04/2023 23:06    ED Course / MDM   Clinical Course as of 10/04/23 2334  Sun Oct 04, 2023  2323 Dr. Del Favia with medicine to evaluate for admission.  [BH]    Clinical Course User Index [BH] Ovie Cornelio A, PA-C   Patient assessed.  She has moderate abdominal pain, persistent nausea and vomiting despite antiemetics.  Will plan obtaining CT scan, additional antiemetics, IV fluids and reassess  Labs and personally viewed and interpreted: Declined chest x-ray Delta trop negative Lipase 33 CBC without leukocytosis Metabolic panel without significant abnormality CT scan without significant abnormality UA w/ moderate leuks WBC 6-10 no UTI symptoms, sent for culture  Patient reassessed.  Still has some pretty significant nausea, retching as well as abdominal pain.  We discussed her reassuring imaging.  At this time she is already had Zofran , Phenergan  with persistent symptoms will admit for further management and workup.  We did discuss given her dizziness and vomiting getting a head CT however patient does not want the head CT.  Discussed with Dr. Del Favia who is agreeable to evaluate patient for admission.  The patient appears reasonably stabilized for admission considering the current resources, flow, and capabilities available in the ED at this time, and I doubt any other [facility] requiring further screening and/or treatment in the ED prior to admission.   Medical Decision Making Amount and/or Complexity of Data Reviewed External Data Reviewed: labs, radiology, ECG and notes. Labs: ordered. Decision-making details documented in ED Course. Radiology: ordered and independent interpretation performed. Decision-making details documented in ED  Course. ECG/medicine tests: ordered and independent interpretation performed. Decision-making details documented in ED Course.  Risk OTC drugs. Prescription drug management. Parenteral controlled substances. Decision regarding hospitalization. Diagnosis or treatment significantly limited by social determinants of health.          Toshiyuki Fredell A, PA-C 10/04/23 2334    Jerilynn Montenegro, MD 10/06/23 [phone]

## 2023-10-04 NOTE — Discharge Instructions (Addendum)
 Thank you for letting us  evaluate you today.  Your lab work was all unremarkable.  Your heart enzymes were normal x 2.  Your EKG is normal.  We have provided you with hydration, Protonix  Emergency Department with improvement of symptoms.  Your vital signs are normal.  Please follow-up with PCP regarding further management  Return to ED if you experience loss of consciousness, chest pain, shortness of breath

## 2023-10-04 NOTE — ED Triage Notes (Signed)
 Pt reports after eating lunch she had 2 episodes of vomiting. PT reports while sitting on the toilet she nearly fainted. Denies fevers.

## 2023-10-04 NOTE — ED Notes (Signed)
 Pt actively vomiting PA notified  ?

## 2023-10-04 NOTE — ED Provider Triage Note (Signed)
 Emergency Medicine Provider Triage Evaluation Note  Katelyn Reyes , a 63 y.o. female  was evaluated in triage.  Pt complains of emesis, near syncope.  Was eating lunch with coworker around 2:00 outside.  Had an episode of emesis and felt lightheaded.  Subsequently went to the building had an additional episode of emesis.  Felt lightheaded went to the restroom and almost passed out however did not have full syncope.  No chest pain, shortness of breath, back pain, bloody stool.  She had coffee and a few crackers for breakfast this morning  Review of Systems  Positive: Near syncope, emesis Negative: Chest pain, shortness of breath, lateral weakness  Physical Exam  LMP  (LMP Unknown)  Gen:   Awake, no distress   Resp:  Normal effort  MSK:   Moves extremities without difficulty  Other:    Medical Decision Making  Medically screening exam initiated at 3:08 PM.  Appropriate orders placed.  Sheppard Dickinson was informed that the remainder of the evaluation will be completed by another provider, this initial triage assessment does not replace that evaluation, and the importance of remaining in the ED until their evaluation is complete.  Near syncope, emesis   Jai Bear A, PA-C 10/04/23 1509

## 2023-10-04 NOTE — H&P (Addendum)
 History and Physical  Katelyn Reyes:[id number] DOB: 1961-01-29 DOA: 10/04/2023  Referring physician: Velora Ghent  PCP: Jess Morita, MD  Outpatient Specialists: GI Patient coming from: Home  Chief Complaint: Nausea vomiting and abdominal pain.  HPI: Katelyn Reyes is a 63 y.o. female with medical history significant for GERD, self-reported GI ulcers (no records found) on as needed PPI, type 2 diabetes on Mounjaro , history of obesity, breast cancer status post bilateral mastectomy on anastrozole , who presents to the ER due to intractable nausea and vomiting.  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  During her lunch break around 2 PM, after eating noodles she developed sudden onset nausea and vomiting which have persisted.  Associated with upper abdominal pain.  No recent changes in home medications or diet.  She is on high dose of Mounjaro  which she has been on for several months.  No frequent use of NSAIDs.  No frequent use of alcohol.  She presented to the ER for further evaluation.  In the ER, her nausea and vomiting persisted despite several rounds of IV antiemetics and IV opiate-based analgesics.  Additionally, the patient received IV PPI for self-reported history of unspecified GI ulcers.  Lipase and LFTs were within normal range.  Contrasted CT abdomen and pelvis was unrevealing.  Afebrile with no leukocytosis.  [facility], hospitalist service, was asked to admit.  At the time of this visit, the patient is alert and oriented x 3.  Continues to have moderate upper abdominal pain.  Later, received a call from bedside RN due to recurrent nausea and vomiting.  GI, Dr. Lavaughn Portland, consulted to further assess and rule out gastritis.  ED Course: Temperature 98.1.  BP 120/78, pulse 79, respiratory rate 17, O2 saturation 98% on room air.  Lab studies were grossly unremarkable except for hypokalemia with potassium of 3.4.  Review of Systems: Review of systems as noted in the  HPI. All other systems reviewed and are negative.   Past Medical History:  Diagnosis Date   Allergic rhinitis    Anxiety    Arthritis    Asthma    related to seasonal allergies   Breast calcification, right 02/02/2013   Surgically excised 02/17/13 > path benign    Cancer (HCC)    Depression    Family history of breast cancer 04/12/2021   Family history of ovarian cancer 04/12/2021   GERD (gastroesophageal reflux disease)    Hyperlipidemia    Intractable nausea and vomiting 10/04/2023   IUD 02/07/2010   removed   Rotator cuff tear    right   Past Surgical History:  Procedure Laterality Date   BREAST BIOPSY Right 02/17/2013   Procedure:  NEEDLE LOCALIZATION REMOVAL RIGHT BREAST CALCIFICATIONS;  Surgeon: Darcella Earnest, MD;  Location: [facility];  Service: General;  Laterality: Right;   BREAST EXCISIONAL BIOPSY Right pt unsure   benign   BREAST RECONSTRUCTION WITH PLACEMENT OF TISSUE EXPANDER AND ALLODERM Bilateral 04/26/2021   Procedure: BREAST RECONSTRUCTION WITH PLACEMENT OF TISSUE EXPANDER AND ALLODERM;  Surgeon: Alger Infield, MD;  Location: [facility];  Service: Plastics;  Laterality: Bilateral;   BURCH PROCEDURE N/A 12/22/2013   Procedure: BURCH CYSTO URETHROPEXY, ANTERIOR AND POSTERIOR CULPORRHAPHY;  Surgeon: Lula Sale, MD;  Location: [facility];  Service: Gynecology;  Laterality: N/A;   CERVICAL DISC ARTHROPLASTY N/A 11/12/2017   Procedure: ARTIFICIAL DISC REPLACEMENT CERVICAL SIX-SEVEN;  Surgeon: Audie Bleacher, MD;  Location: [facility];  Service: Neurosurgery;  Laterality:  N/A;  ARTIFICIAL DISC REPLACEMENT CERVICAL 6- CERVICAL 7   CHOLECYSTECTOMY N/A 10/09/2017   Procedure: LAPAROSCOPIC CHOLECYSTECTOMY WITH INTRAOPERATIVE CHOLANGIOGRAM;  Surgeon: Jerryl Morin, MD;  Location: [facility];  Service: General;  Laterality: N/A;   KNEE ARTHROSCOPY WITH MEDIAL MENISECTOMY Right 05/01/2020   Procedure: KNEE ARTHROSCOPY WITH PARTIAL MEDIAL MENISECTOMY,    PATELLA  CHONDROPLASTY;  Surgeon: Claiborne Crew, MD;  Location: [facility];  Service: Orthopedics;  Laterality: Right;   LIPOSUCTION WITH LIPOFILLING Bilateral 07/23/2021   Procedure: LIPOFILLING FROM ABDOMEN TO BILATERAL CHEST;  Surgeon: Alger Infield, MD;  Location: [facility];  Service: Plastics;  Laterality: Bilateral;   MASTECTOMY W/ SENTINEL NODE BIOPSY Right 04/26/2021   Procedure: RIGHT MASTECTOMY WITH RIGHT SENTINEL LYMPH NODE BIOPSY;  Surgeon: Oza Blumenthal, MD;  Location: [facility];  Service: General;  Laterality: Right;   MOUTH SURGERY     NASAL SINUS SURGERY     REMOVAL OF BILATERAL TISSUE EXPANDERS WITH PLACEMENT OF BILATERAL BREAST IMPLANTS Bilateral 07/23/2021   Procedure: REMOVAL OF BILATERAL TISSUE EXPANDERS WITH PLACEMENT OF BILATERAL BREAST SILICONE IMPLANTS;  Surgeon: Alger Infield, MD;  Location: [facility];  Service: Plastics;  Laterality: Bilateral;   SHOULDER ARTHROSCOPY WITH ROTATOR CUFF REPAIR AND OPEN BICEPS TENODESIS Right 08/29/2021   Procedure: RIGHT SHOULDER ARTHROSCOPY WITH ROTATOR CUFF REPAIR WITH PATCH AUGMENTATION AND  BICEPS TENODESIS;  Surgeon: Wilhelmenia Harada, MD;  Location: [facility];  Service: Orthopedics;  Laterality: Right;   TOTAL MASTECTOMY Left 04/26/2021   Procedure: LEFT TOTAL MASTECTOMY;  Surgeon: Oza Blumenthal, MD;  Location: [facility];  Service: General;  Laterality: Left;    Social History:  reports that she has never smoked. She has never used smokeless tobacco. She reports current alcohol use. She reports that she does not use drugs.   Allergies  Allergen Reactions   Amoxicillin  Rash and Other (See Comments)    Has patient had a PCN reaction causing immediate rash, facial/tongue/throat swelling, SOB or lightheadedness with hypotension: No Has patient had a PCN reaction causing severe rash involving mucus membranes or skin necrosis:  No Has patient had a PCN reaction that required treatment: #  #  #  YES  #  #  # - MD office Has patient had a PCN reaction occurring within the last 10 years: Yes If all of the above answers are "NO", then may proceed with Cephalosporin use.    Erythromycin Nausea Only   Hydrocodone-Acetaminophen  Other (See Comments)    Hallucinations   Penicillin G Rash   Shellfish Allergy Itching and Other (See Comments)    REACTS TO SCALLOPS     Family History  Problem Relation Age of Onset   Fibrocystic breast disease Mother    Hypertension Father    Diabetes Father    Breast cancer Maternal Aunt        dx 60s   Colon cancer Paternal Aunt        x2 pat aunts, dx after 70   Stroke Maternal Grandmother    Cancer Other        ovarian/GYN; MGF's mother   Colon cancer Other        MGM's father; dx after 23   Coronary artery disease Neg Hx       Prior to Admission medications   Medication Sig Start Date End Date Taking? Authorizing Provider  albuterol  (VENTOLIN  HFA) 108 (90 Base) MCG/ACT inhaler Inhale 2 puffs into the lungs every 6 (  six) hours as needed for wheezing. 07/21/22   Tabori, Katherine E, MD  ALPRAZolam  (XANAX ) 0.5 MG tablet Take 1 tablet (0.5 mg total) by mouth 3 (three) times daily as needed. 07/29/23   Tabori, Katherine E, MD  anastrozole  (ARIMIDEX ) 1 MG tablet Take 1 tablet (1 mg total) by mouth daily. 10/30/22   Ivor Mars, MD  cetirizine  (ZYRTEC ) 10 MG tablet Take 1 tablet (10 mg total) by mouth daily. 01/08/22   Jess Morita, MD  Cholecalciferol  (VITAMIN D -3) 125 MCG (5000 UT) TABS Take 5,000 Units by mouth every evening.    [provider]  fluticasone  (FLONASE ) 50 MCG/ACT nasal spray Place 2 sprays into both nostrils daily. 07/21/22   Tabori, Katherine E, MD  gabapentin  (NEURONTIN ) 300 MG capsule Take 2 capsules (600 mg total) by mouth at bedtime. 08/13/23   Ivor Mars, MD  Insulin  Pen Needle 32G X 4 MM MISC Use daily w/ saxenda  01/23/22   Tabori,  Katherine E, MD  MAGNESIUM PO Take by mouth as directed. L-threonate 1000 mg nightly    [provider]  meloxicam  (MOBIC ) 15 MG tablet Take 1 tablet (15 mg total) by mouth daily. 08/27/22   Brandt Cake, DPM  montelukast  (SINGULAIR ) 10 MG tablet Take 1 tablet (10 mg total) by mouth daily. 09/16/23   Tabori, Katherine E, MD  pantoprazole  (PROTONIX ) 40 MG tablet Take 1 tablet (40 mg total) by mouth 2 (two) times daily. 07/21/22   Tabori, Katherine E, MD  sertraline  (ZOLOFT ) 100 MG tablet Take 1 tablet (100 mg total) by mouth daily. 07/29/23   Tabori, Katherine E, MD  tirzepatide  (MOUNJARO ) 15 MG/0.5ML Pen Inject 15 mg into the skin once a week. 07/29/23   Tabori, Katherine E, MD  tiZANidine  (ZANAFLEX ) 4 MG tablet Take 1 tablet (4 mg total) by mouth 2 (two) times daily. 09/22/22     zolpidem  (AMBIEN ) 10 MG tablet Take 1 tablet (10 mg total) by mouth at bedtime as needed for sleep. 07/29/23   Jess Morita, MD    Physical Exam: BP 124/81   Pulse 89   Temp 98.1 F (36.7 C) (Oral)   Resp 19   LMP  (LMP Unknown)   SpO2 100%   General: 63 y.o. year-old female well developed well nourished in no acute distress.  Alert and oriented x3. Cardiovascular: Regular rate and rhythm with no rubs or gallops.  No thyromegaly or JVD noted.  No lower extremity edema. 2/4 pulses in all 4 extremities. Respiratory: Clear to auscultation with no wheezes or rales. Good inspiratory effort. Abdomen: Soft, epigastric tenderness with mild palpation.  Nondistended with normal bowel sounds x4 quadrants. Muskuloskeletal: No cyanosis, clubbing or edema noted bilaterally Neuro: CN II-XII intact, strength, sensation, reflexes Skin: No ulcerative lesions noted or rashes Psychiatry: Judgement and insight appear normal. Mood is appropriate for condition and setting          Labs on Admission:  Basic Metabolic Panel: Recent Labs  Lab 10/04/23 1603  NA 141  K 3.4*  CL 108  CO2 25  GLUCOSE 97  BUN 16   CREATININE 0.86  CALCIUM 9.6   Liver Function Tests: Recent Labs  Lab 10/04/23 1603  AST 24  ALT 21  ALKPHOS 62  BILITOT 0.7  PROT 7.7  ALBUMIN 4.3   Recent Labs  Lab 10/04/23 1603  LIPASE 33   No results for input(s): "AMMONIA" in the last 168 hours. CBC: Recent Labs  Lab 10/04/23 1603  WBC 7.9  NEUTROABS 6.2  HGB 12.6  HCT 38.8  MCV 95.6  PLT 302   Cardiac Enzymes: Recent Labs  Lab 10/04/23 1603  CKTOTAL 146    BNP (last 3 results) No results for input(s): "BNP" in the last 8760 hours.  ProBNP (last 3 results) No results for input(s): "PROBNP" in the last 8760 hours.  CBG: Recent Labs  Lab 10/04/23 1705  GLUCAP 89    Radiological Exams on Admission: CT ABDOMEN PELVIS W CONTRAST Result Date: 10/04/2023 CLINICAL DATA:  Presyncope and vomiting. EXAM: CT ABDOMEN AND PELVIS WITH CONTRAST TECHNIQUE: Multidetector CT imaging of the abdomen and pelvis was performed using the standard protocol following bolus administration of intravenous contrast. RADIATION DOSE REDUCTION: This exam was performed according to the departmental dose-optimization program which includes automated exposure control, adjustment of the mA and/or kV according to patient size and/or use of iterative reconstruction technique. CONTRAST:  OMNIPAQUE  IOHEXOL  300 MG/ML  SOLN COMPARISON:  May 31, 2021 FINDINGS: Lower chest: No acute abnormality. Hepatobiliary: No focal liver abnormality is seen. Status post cholecystectomy. No biliary dilatation. Pancreas: Unremarkable. No pancreatic ductal dilatation or surrounding inflammatory changes. Spleen: Normal in size without focal abnormality. Adrenals/Urinary Tract: Adrenal glands are unremarkable. Kidneys are normal, without renal calculi, focal lesion, or hydronephrosis. Bladder is unremarkable. Stomach/Bowel: Stomach is within normal limits. Appendix appears normal. No evidence of bowel wall thickening, distention, or inflammatory changes.  Vascular/Lymphatic: No significant vascular findings are present. No enlarged abdominal or pelvic lymph nodes. Reproductive: Uterus and bilateral adnexa are unremarkable. Other: No abdominal wall hernia or abnormality. No abdominopelvic ascites. Musculoskeletal: Bilateral breast implants are noted. No acute or significant osseous findings. IMPRESSION: No acute or active process within the abdomen or pelvis. Electronically Signed   By: Virgle Grime M.D.   On: 10/04/2023 23:06    EKG: I independently viewed the EKG done and my findings are as followed: None available at the time of this visit.  Assessment/Plan Present on Admission:  Intractable nausea and vomiting  Principal Problem:   Intractable nausea and vomiting  Intractable nausea and vomiting Rule out gastritis versus food poisoning versus acute gastroenteritis Possibly contributed by Mounjaro  Continue supportive care IV fluid hydration IV antiemetics as needed Pain control as needed IV PPI twice daily [facility] consulted N.p.o. until seen by GI  Self-reported history of unspecified GI ulcers Continue IV PPI twice daily Avoid NSAIDs N.p.o. until seen by GI  Hypokalemia secondary to GI losses from vomiting Serum potassium 3.4 Repleted intravenously Repeat chemistry panel in the morning and check magnesium level  Asthma Resume home montelukast   Chronic anxiety/depression Resume home regimen.   Time: 75 minutes.   DVT prophylaxis: Subcu Lovenox  daily.  Code Status: Full code.  Family Communication: None at bedside.  Disposition Plan: Admitted to MedSurg unit.  Consults called: [facility].  Admission status: Observation status.   Status is: Observation    Bary Boss MD [facility] Pager [phone]  If 7PM-7AM, please contact night-coverage [email or web address] Password [email or web address]  10/04/2023, 11:41 PM

## 2023-10-05 DIAGNOSIS — K219 Gastro-esophageal reflux disease without esophagitis: Secondary | ICD-10-CM | POA: Diagnosis not present

## 2023-10-05 DIAGNOSIS — Z9013 Acquired absence of bilateral breasts and nipples: Secondary | ICD-10-CM | POA: Diagnosis not present

## 2023-10-05 DIAGNOSIS — Z880 Allergy status to penicillin: Secondary | ICD-10-CM | POA: Diagnosis not present

## 2023-10-05 DIAGNOSIS — E876 Hypokalemia: Secondary | ICD-10-CM | POA: Diagnosis not present

## 2023-10-05 DIAGNOSIS — Z803 Family history of malignant neoplasm of breast: Secondary | ICD-10-CM | POA: Diagnosis not present

## 2023-10-05 DIAGNOSIS — E785 Hyperlipidemia, unspecified: Secondary | ICD-10-CM | POA: Diagnosis not present

## 2023-10-05 DIAGNOSIS — Z791 Long term (current) use of non-steroidal anti-inflammatories (NSAID): Secondary | ICD-10-CM | POA: Diagnosis not present

## 2023-10-05 DIAGNOSIS — R945 Abnormal results of liver function studies: Secondary | ICD-10-CM | POA: Diagnosis not present

## 2023-10-05 DIAGNOSIS — Z7985 Long-term (current) use of injectable non-insulin antidiabetic drugs: Secondary | ICD-10-CM | POA: Diagnosis not present

## 2023-10-05 DIAGNOSIS — R1033 Periumbilical pain: Secondary | ICD-10-CM | POA: Diagnosis not present

## 2023-10-05 DIAGNOSIS — Z79811 Long term (current) use of aromatase inhibitors: Secondary | ICD-10-CM | POA: Diagnosis not present

## 2023-10-05 DIAGNOSIS — E119 Type 2 diabetes mellitus without complications: Secondary | ICD-10-CM | POA: Diagnosis not present

## 2023-10-05 DIAGNOSIS — R112 Nausea with vomiting, unspecified: Secondary | ICD-10-CM | POA: Diagnosis not present

## 2023-10-05 DIAGNOSIS — Z881 Allergy status to other antibiotic agents status: Secondary | ICD-10-CM | POA: Diagnosis not present

## 2023-10-05 DIAGNOSIS — Z8249 Family history of ischemic heart disease and other diseases of the circulatory system: Secondary | ICD-10-CM | POA: Diagnosis not present

## 2023-10-05 DIAGNOSIS — Z91013 Allergy to seafood: Secondary | ICD-10-CM | POA: Diagnosis not present

## 2023-10-05 DIAGNOSIS — Z823 Family history of stroke: Secondary | ICD-10-CM | POA: Diagnosis not present

## 2023-10-05 DIAGNOSIS — A059 Bacterial foodborne intoxication, unspecified: Secondary | ICD-10-CM | POA: Diagnosis not present

## 2023-10-05 DIAGNOSIS — Z833 Family history of diabetes mellitus: Secondary | ICD-10-CM | POA: Diagnosis not present

## 2023-10-05 DIAGNOSIS — Z800 Family history of malignant neoplasm of digestive organs: Secondary | ICD-10-CM | POA: Diagnosis not present

## 2023-10-05 DIAGNOSIS — F32A Depression, unspecified: Secondary | ICD-10-CM | POA: Diagnosis not present

## 2023-10-05 DIAGNOSIS — F419 Anxiety disorder, unspecified: Secondary | ICD-10-CM | POA: Diagnosis not present

## 2023-10-05 DIAGNOSIS — J45909 Unspecified asthma, uncomplicated: Secondary | ICD-10-CM | POA: Diagnosis not present

## 2023-10-05 DIAGNOSIS — Z885 Allergy status to narcotic agent status: Secondary | ICD-10-CM | POA: Diagnosis not present

## 2023-10-05 DIAGNOSIS — E669 Obesity, unspecified: Secondary | ICD-10-CM | POA: Diagnosis not present

## 2023-10-05 DIAGNOSIS — Z8041 Family history of malignant neoplasm of ovary: Secondary | ICD-10-CM | POA: Diagnosis not present

## 2023-10-05 DIAGNOSIS — Z853 Personal history of malignant neoplasm of breast: Secondary | ICD-10-CM | POA: Diagnosis not present

## 2023-10-05 LAB — CBC
HCT: 36 % (ref 36.0–46.0)
Hemoglobin: 11.7 g/dL — ABNORMAL LOW (ref 12.0–15.0)
MCH: 31.1 pg (ref 26.0–34.0)
MCHC: 32.5 g/dL (ref 30.0–36.0)
MCV: 95.7 fL (ref 80.0–100.0)
Platelets: 260 10*3/uL (ref 150–400)
RBC: 3.76 MIL/uL — ABNORMAL LOW (ref 3.87–5.11)
RDW: 12.2 % (ref 11.5–15.5)
WBC: 6.6 10*3/uL (ref 4.0–10.5)
nRBC: 0 % (ref 0.0–0.2)

## 2023-10-05 LAB — COMPREHENSIVE METABOLIC PANEL WITH GFR
ALT: 119 U/L — ABNORMAL HIGH (ref 0–44)
AST: 228 U/L — ABNORMAL HIGH (ref 15–41)
Albumin: 3.3 g/dL — ABNORMAL LOW (ref 3.5–5.0)
Alkaline Phosphatase: 82 U/L (ref 38–126)
Anion gap: 5 (ref 5–15)
BUN: 11 mg/dL (ref 6–20)
CO2: 27 mmol/L (ref 22–32)
Calcium: 8.4 mg/dL — ABNORMAL LOW (ref 8.9–10.3)
Chloride: 107 mmol/L (ref 98–111)
Creatinine, Ser: 0.76 mg/dL (ref 0.44–1.00)
GFR, Estimated: >60 mL/min (ref >60)
Glucose, Bld: 92 mg/dL (ref 70–99)
Potassium: 3.4 mmol/L — ABNORMAL LOW (ref 3.5–5.1)
Sodium: 139 mmol/L (ref 135–145)
Total Bilirubin: 0.9 mg/dL (ref 0.0–1.2)
Total Protein: 6.3 g/dL — ABNORMAL LOW (ref 6.5–8.1)

## 2023-10-05 LAB — GLUCOSE, CAPILLARY: Glucose-Capillary: 92 mg/dL (ref 70–99)

## 2023-10-05 LAB — CBG MONITORING, ED
Glucose-Capillary: 100 mg/dL — ABNORMAL HIGH (ref 70–99)
Glucose-Capillary: 84 mg/dL (ref 70–99)

## 2023-10-05 LAB — MAGNESIUM: Magnesium: 1.9 mg/dL (ref 1.7–2.4)

## 2023-10-05 LAB — HIV ANTIBODY (ROUTINE TESTING W REFLEX): HIV Screen 4th Generation wRfx: NONREACTIVE

## 2023-10-05 LAB — PHOSPHORUS: Phosphorus: 3.4 mg/dL (ref 2.5–4.6)

## 2023-10-05 MED ORDER — MORPHINE SULFATE (PF) 2 MG/ML IV SOLN
2.0000 mg | INTRAVENOUS | Status: DC | PRN
  Administered 2023-10-05 (×3): 2 mg via INTRAVENOUS
  Filled 2023-10-05 (×3): qty 1

## 2023-10-05 MED ORDER — ENOXAPARIN SODIUM 40 MG/0.4ML IJ SOSY
40.0000 mg | PREFILLED_SYRINGE | INTRAMUSCULAR | Status: DC
  Administered 2023-10-05 – 2023-10-06 (×2): 40 mg via SUBCUTANEOUS
  Filled 2023-10-05 (×2): qty 0.4

## 2023-10-05 MED ORDER — VITAMIN D 25 MCG (1000 UNIT) PO TABS
5000.0000 [IU] | ORAL_TABLET | Freq: Every evening | ORAL | Status: DC
Start: 2023-10-05 — End: 2023-10-06
  Administered 2023-10-05: 5000 [IU] via ORAL
  Filled 2023-10-05: qty 5

## 2023-10-05 MED ORDER — POTASSIUM CHLORIDE 10 MEQ/100ML IV SOLN
10.0000 meq | INTRAVENOUS | Status: AC
Start: 2023-10-05 — End: 2023-10-05
  Administered 2023-10-05 (×2): 10 meq via INTRAVENOUS
  Filled 2023-10-05 (×2): qty 100

## 2023-10-05 MED ORDER — PANTOPRAZOLE SODIUM 40 MG IV SOLR
40.0000 mg | Freq: Two times a day (BID) | INTRAVENOUS | Status: DC
  Administered 2023-10-05 – 2023-10-06 (×4): 40 mg via INTRAVENOUS
  Filled 2023-10-05 (×4): qty 10

## 2023-10-05 MED ORDER — FLUTICASONE PROPIONATE 50 MCG/ACT NA SUSP
2.0000 | Freq: Every day | NASAL | Status: DC
  Administered 2023-10-05 – 2023-10-06 (×2): 2 via NASAL
  Filled 2023-10-05: qty 16

## 2023-10-05 MED ORDER — ZOLPIDEM TARTRATE 5 MG PO TABS
5.0000 mg | ORAL_TABLET | Freq: Every evening | ORAL | Status: DC | PRN
  Administered 2023-10-05: 5 mg via ORAL
  Filled 2023-10-05: qty 1

## 2023-10-05 MED ORDER — POTASSIUM CHLORIDE IN NACL 40-0.9 MEQ/L-% IV SOLN
INTRAVENOUS | Status: AC
  Filled 2023-10-05 (×2): qty 1000

## 2023-10-05 MED ORDER — LORATADINE 10 MG PO TABS
10.0000 mg | ORAL_TABLET | Freq: Every day | ORAL | Status: DC
  Administered 2023-10-05: 10 mg via ORAL
  Filled 2023-10-05: qty 1

## 2023-10-05 MED ORDER — SODIUM CHLORIDE 0.9 % IV SOLN
12.5000 mg | Freq: Once | INTRAVENOUS | Status: AC
  Administered 2023-10-05: 12.5 mg via INTRAVENOUS
  Filled 2023-10-05: qty 12.5

## 2023-10-05 NOTE — Hospital Course (Addendum)
 62 yof w/ hx of GERD, self-reported GI ulcers on PRN PPI,T2DM on Mounjaro , history of obesity, breast cancer s/p b/l mastectomy on anastrozole ,presented to the ER due to intractable nausea and vomiting onset around 2 PM, after eating noodles w/ associated upper abdominal pain. In the ED her nausea and vomiting persisted despite several rounds of IV antiemetics and IV opiate-based analgesics.  Additionally, the patient received IV PPI for self-reported history of unspecified GI ulcers.  Lipase and LFTs were within normal range. Contrasted CT abdomen and pelvis was unrevealing, s/p cholecystectomy, no biliary dilatation. GI was consulted-likely secondary to some foodborne illness as the patient was at her baseline prior to eating her lunch, LFTs worsened likely reactive.  Patient was slowly started on diet tolerating at this time no further GI workup pending okay for discharge once tolerating diet.   Subjective: Seen and examined Resting comfortably no complaint Overnight labs remained stable  Has been able to tolerate diet  Discharge diagnosis: Intractable nausea and vomiting and abdominal pain History of GI ulcer per patient Suspected foodborne illness: Symptoms were acute onset after eating food- CT abdomen unrevealing, s/p CCY.  Seen by GI likely foodborne illness at this time symptoms reasonable tolerating diet plan for outpatient follow-up and discharge on home regimen if tolerating diet Discussed plan of care with GI cont bid PPI  Transaminitis: Downtrending likely reactive.  Follow-up outpatient   Hypokalemia: Replaced.     Asthma Cont home montelukast    Chronic anxiety/depression Continue home Xanax /zolpidem    Type 2 diabetes on Mounjaro : Blood sugar well-controlled, last A1c less than 5.5 in February,check  cbg bid Recent Labs  Lab 10/04/23 1705 10/05/23 0009 10/05/23 0816 10/05/23 1656  GLUCAP 89 84 100* 92

## 2023-10-05 NOTE — Consult Note (Addendum)
 Reason for Consult:Abdominal paina with nausea and vomiting & elevated LFT's.  Referring Physician: THP  EAVAN GONTERMAN is an 63 y.o. female.  HPI: Patient is a 72 year black female with multiple medical problems listed below who presented to the emergency room with severe abdominal pain and nausea with vomiting yesterday after she ate some noodles with chicken for for lunch [leftovers from the day before] with one episode of diarrhea. She denies taking any new medications or over-the-counter supplements.  A CT scan of the abdomen pelvis with contrast on admission did not reveal any acute findings or ductal dilation. Patient has had a previous cholecystectomy. She has a longstanding history of acid reflux and takes PPIs on a as needed basis. On admission she was not found noted to have normal LFTs but her LFTs today revealed a total bili of 0.9 with AST of 228 and ALT of 119 and alkaline phosphatase of 82 with albumin of 3.3. She is currently much better with regards to her abdominal pain and she rates it at a 2/10. She has had problems with severe reflux and retrosternal burning.  Prior to the onset of her symptoms she had a good appetite and has lost 40 pounds on Mounjaro  over the the last year  Past Medical History:  Diagnosis Date   Allergic rhinitis    Anxiety    Arthritis    Asthma    related to seasonal allergies   Breast calcification, right 02/02/2013   Surgically excised 02/17/13 > path benign    Breast cancer right breast-early intraductal cancer (HCC)    Depression    Family history of breast cancer 04/12/2021   Family history of ovarian cancer 04/12/2021   GERD (gastroesophageal reflux disease)    Hyperlipidemia    IUD 02/07/2010   removed   Rotator cuff tear    right   Past Surgical History:  Procedure Laterality Date   BREAST BIOPSY Right 02/17/2013   Procedure:  NEEDLE LOCALIZATION REMOVAL RIGHT BREAST CALCIFICATIONS;  Surgeon: Darcella Earnest, MD;  Location: [facility];  Service: General;  Laterality: Right;   BREAST EXCISIONAL BIOPSY Right pt unsure   benign   BREAST RECONSTRUCTION WITH PLACEMENT OF TISSUE EXPANDER AND ALLODERM Bilateral 04/26/2021   Procedure: BREAST RECONSTRUCTION WITH PLACEMENT OF TISSUE EXPANDER AND ALLODERM;  Surgeon: Alger Infield, MD;  Location: [facility];  Service: Plastics;  Laterality: Bilateral;   BURCH PROCEDURE N/A 12/22/2013   Procedure: BURCH CYSTO URETHROPEXY, ANTERIOR AND POSTERIOR CULPORRHAPHY;  Surgeon: Lula Sale, MD;  Location: [facility];  Service: Gynecology;  Laterality: N/A;   CERVICAL DISC ARTHROPLASTY N/A 11/12/2017   Procedure: ARTIFICIAL DISC REPLACEMENT CERVICAL SIX-SEVEN;  Surgeon: Audie Bleacher, MD;  Location: [facility];  Service: Neurosurgery;  Laterality: N/A;  ARTIFICIAL DISC REPLACEMENT CERVICAL 6- CERVICAL 7   CHOLECYSTECTOMY N/A 10/09/2017   Procedure: LAPAROSCOPIC CHOLECYSTECTOMY WITH INTRAOPERATIVE CHOLANGIOGRAM;  Surgeon: Jerryl Morin, MD;  Location: [facility];  Service: General;  Laterality: N/A;   KNEE ARTHROSCOPY WITH MEDIAL MENISECTOMY Right 05/01/2020   Procedure: KNEE ARTHROSCOPY WITH PARTIAL MEDIAL MENISECTOMY,   PATELLA  CHONDROPLASTY;  Surgeon: Claiborne Crew, MD;  Location: [facility]  Service: Orthopedics;  Laterality: Right;   LIPOSUCTION WITH LIPOFILLING Bilateral 07/23/2021   Procedure: LIPOFILLING FROM ABDOMEN TO BILATERAL CHEST;  Surgeon: Alger Infield, MD;  Location: [facility];  Service: Plastics;  Laterality: Bilateral;   MASTECTOMY W/ SENTINEL NODE BIOPSY Right 04/26/2021   Procedure: RIGHT MASTECTOMY  WITH RIGHT SENTINEL LYMPH NODE BIOPSY;  Surgeon: Oza Blumenthal, MD;  Location: [facility];  Service: General;  Laterality: Right;   MOUTH SURGERY     NASAL SINUS SURGERY     REMOVAL OF BILATERAL TISSUE EXPANDERS WITH PLACEMENT OF BILATERAL BREAST IMPLANTS Bilateral 07/23/2021   Procedure: REMOVAL OF BILATERAL  TISSUE EXPANDERS WITH PLACEMENT OF BILATERAL BREAST SILICONE IMPLANTS;  Surgeon: Alger Infield, MD;  Location: [facility];  Service: Plastics;  Laterality: Bilateral;   SHOULDER ARTHROSCOPY WITH ROTATOR CUFF REPAIR AND OPEN BICEPS TENODESIS Right 08/29/2021   Procedure: RIGHT SHOULDER ARTHROSCOPY WITH ROTATOR CUFF REPAIR WITH PATCH AUGMENTATION AND  BICEPS TENODESIS;  Surgeon: Wilhelmenia Harada, MD;  Location: [facility];  Service: Orthopedics;  Laterality: Right;   TOTAL MASTECTOMY Left 04/26/2021   Procedure: LEFT TOTAL MASTECTOMY;  Surgeon: Oza Blumenthal, MD;  Location: [facility];  Service: General;  Laterality: Left;   Family History  Problem Relation Age of Onset   Fibrocystic breast disease Mother    Hypertension Father    Diabetes Father    Breast cancer Maternal Aunt        dx 63s   Colon cancer Paternal Aunt        x2 pat aunts, dx after 56   Stroke Maternal Grandmother    Cancer Other        ovarian/GYN; MGF's mother   Colon cancer Other        MGM's father; dx after 46   Coronary artery disease Neg Hx    Social History:  reports that she has never smoked. She has never used smokeless tobacco. She reports current alcohol use. She reports that she does not use drugs.  Allergies:  Allergies  Allergen Reactions   Amoxicillin  Rash and Other (See Comments)    Has patient had a PCN reaction causing immediate rash, facial/tongue/throat swelling, SOB or lightheadedness with hypotension: No Has patient had a PCN reaction causing severe rash involving mucus membranes or skin necrosis: No Has patient had a PCN reaction that required treatment: #  #  #  YES  #  #  # - MD office Has patient had a PCN reaction occurring within the last 10 years: Yes If all of the above answers are "NO", then may proceed with Cephalosporin use.    Erythromycin Nausea Only   Hydrocodone-Acetaminophen  Other (See Comments)    Hallucinations   Penicillin  G Rash   Shellfish Allergy Itching and Other (See Comments)    REACTS TO SCALLOPS    Medications: I have reviewed the patient's current medications. Prior to Admission:  Medications Prior to Admission  Medication Sig Dispense Refill Last Dose/Taking   albuterol  (VENTOLIN  HFA) 108 (90 Base) MCG/ACT inhaler Inhale 2 puffs into the lungs every 6 (six) hours as needed for wheezing. 6.7 g 6 Unknown   ALPRAZolam  (XANAX ) 0.5 MG tablet Take 1 tablet (0.5 mg total) by mouth 3 (three) times daily as needed. 60 tablet 3 Past Week   anastrozole  (ARIMIDEX ) 1 MG tablet Take 1 tablet (1 mg total) by mouth daily. 90 tablet 12 10/03/2023 Bedtime   cetirizine  (ZYRTEC ) 10 MG tablet Take 1 tablet (10 mg total) by mouth daily. 90 tablet 1 10/03/2023 Bedtime   Cholecalciferol  (VITAMIN D -3) 125 MCG (5000 UT) TABS Take 5,000 Units by mouth every evening.   10/03/2023 Bedtime   fluticasone  (FLONASE ) 50 MCG/ACT nasal spray Place 2 sprays into both nostrils daily. 16 g 6 10/03/2023 Morning  gabapentin  (NEURONTIN ) 300 MG capsule Take 2 capsules (600 mg total) by mouth at bedtime. 60 capsule 4 10/03/2023 Bedtime   MAGNESIUM PO Take by mouth as directed. L-threonate 1000 mg nightly   10/03/2023 Bedtime   montelukast  (SINGULAIR ) 10 MG tablet Take 1 tablet (10 mg total) by mouth daily. 90 tablet 1 10/03/2023 Bedtime   pantoprazole  (PROTONIX ) 40 MG tablet Take 1 tablet (40 mg total) by mouth 2 (two) times daily. (Patient taking differently: Take 40 mg by mouth daily as needed.) 180 tablet 1 Unknown   sertraline  (ZOLOFT ) 100 MG tablet Take 1 tablet (100 mg total) by mouth daily. 90 tablet 1 10/03/2023 Bedtime   zolpidem  (AMBIEN ) 10 MG tablet Take 1 tablet (10 mg total) by mouth at bedtime as needed for sleep. 30 tablet 3 Past Week   Insulin  Pen Needle 32G X 4 MM MISC Use daily w/ saxenda  100 each 3    meloxicam  (MOBIC ) 15 MG tablet Take 1 tablet (15 mg total) by mouth daily. (Patient not taking: Reported on 10/04/2023) 30 tablet 2 Not  Taking   tirzepatide  (MOUNJARO ) 15 MG/0.5ML Pen Inject 15 mg into the skin once a week. (Patient taking differently: Inject 15 mg into the skin once a week. Wed) 6 mL 1 09/30/2023   tiZANidine  (ZANAFLEX ) 4 MG tablet Take 1 tablet (4 mg total) by mouth 2 (two) times daily. (Patient not taking: Reported on 10/04/2023) 60 tablet 0 Not Taking   Scheduled:  anastrozole   1 mg Oral Daily   enoxaparin  (LOVENOX ) injection  40 mg Subcutaneous Q24H   gabapentin   600 mg Oral QHS   montelukast   10 mg Oral Daily   pantoprazole  (PROTONIX ) IV  40 mg Intravenous BID   sertraline   100 mg Oral QHS   Continuous:  0.9 % NaCl with KCl 40 mEq / L 50 mL/hr at 10/05/23 0829   lactated ringers  Stopped (10/05/23 0821)   promethazine  (PHENERGAN ) injection (IM or IVPB) Stopped (10/05/23 0914)   PRN:acetaminophen , albuterol , melatonin, morphine  injection, polyethylene glycol, promethazine  (PHENERGAN ) injection (IM or IVPB)  Results for orders placed or performed during the hospital encounter of 10/04/23 (from the past 48 hours)  CBC with Differential     Status: None   Collection Time: 10/04/23  4:03 PM  Result Value Ref Range   WBC 7.9 4.0 - 10.5 K/uL   RBC 4.06 3.87 - 5.11 MIL/uL   Hemoglobin 12.6 12.0 - 15.0 g/dL   HCT 40.9 81.1 - 91.4 %   MCV 95.6 80.0 - 100.0 fL   MCH 31.0 26.0 - 34.0 pg   MCHC 32.5 30.0 - 36.0 g/dL   RDW 78.2 95.6 - 21.3 %   Platelets 302 150 - 400 K/uL   nRBC 0.0 0.0 - 0.2 %   Neutrophils Relative % 80 %   Neutro Abs 6.2 1.7 - 7.7 K/uL   Lymphocytes Relative 13 %   Lymphs Abs 1.0 0.7 - 4.0 K/uL   Monocytes Relative 7 %   Monocytes Absolute 0.6 0.1 - 1.0 K/uL   Eosinophils Relative 0 %   Eosinophils Absolute 0.0 0.0 - 0.5 K/uL   Basophils Relative 0 %   Basophils Absolute 0.0 0.0 - 0.1 K/uL   Immature Granulocytes 0 %   Abs Immature Granulocytes 0.03 0.00 - 0.07 K/uL    Comment: Performed at [facility], [address], [place], [place] [address]   Comprehensive metabolic panel     Status: Abnormal   Collection Time: 10/04/23  4:03 PM  Result Value Ref Range   Sodium 141 135 - 145 mmol/L   Potassium 3.4 (L) 3.5 - 5.1 mmol/L   Chloride 108 98 - 111 mmol/L   CO2 25 22 - 32 mmol/L   Glucose, Bld 97 70 - 99 mg/dL    Comment: Glucose reference range applies only to samples taken after fasting for at least 8 hours.   BUN 16 6 - 20 mg/dL   Creatinine, Ser 1.61 0.44 - 1.00 mg/dL   Calcium 9.6 8.9 - 09.6 mg/dL   Total Protein 7.7 6.5 - 8.1 g/dL   Albumin 4.3 3.5 - 5.0 g/dL   AST 24 15 - 41 U/L   ALT 21 0 - 44 U/L   Alkaline Phosphatase 62 38 - 126 U/L   Total Bilirubin 0.7 0.0 - 1.2 mg/dL   GFR, Estimated >04 >54 mL/min    Comment: (NOTE) Calculated using the CKD-EPI Creatinine Equation (2021)    Anion gap 8 5 - 15    Comment: Performed at [facility], [address], [place], [place] [address]  Lipase, blood     Status: None   Collection Time: 10/04/23  4:03 PM  Result Value Ref Range   Lipase 33 11 - 51 U/L    Comment: Performed at [facility], [address] [place], [place] [address]  Troponin I (High Sensitivity)     Status: None   Collection Time: 10/04/23  4:03 PM  Result Value Ref Range   Troponin I (High Sensitivity) 7 <18 ng/L    Comment: (NOTE) Elevated high sensitivity troponin I (hsTnI) values and significant  changes across serial measurements may suggest ACS but many other  chronic and acute conditions are known to elevate hsTnI results.  Refer to the "Links" section for chest pain algorithms and additional  guidance. Performed at [facility], [address], [place], [place] [address]   CK     Status: None   Collection Time: 10/04/23  4:03 PM  Result Value Ref Range   Total CK 146 38 - 234 U/L    Comment: Performed at [facility], [address], [place], [place] [address]  POC CBG, ED     Status: None   Collection Time:  10/04/23  5:05 PM  Result Value Ref Range   Glucose-Capillary 89 70 - 99 mg/dL    Comment: Glucose reference range applies only to samples taken after fasting for at least 8 hours.  Urinalysis, w/ Reflex to Culture (Infection Suspected) -Urine, Clean Catch     Status: Abnormal   Collection Time: 10/04/23  6:38 PM  Result Value Ref Range   Specimen Source URINE, CLEAN CATCH    Color, Urine YELLOW YELLOW   APPearance HAZY (A) CLEAR   Specific Gravity, Urine 1.029 1.005 - 1.030   pH 5.0 5.0 - 8.0   Glucose, UA NEGATIVE NEGATIVE mg/dL   Hgb urine dipstick SMALL (A) NEGATIVE   Bilirubin Urine NEGATIVE NEGATIVE   Ketones, ur 5 (A) NEGATIVE mg/dL   Protein, ur 30 (A) NEGATIVE mg/dL   Nitrite NEGATIVE NEGATIVE   Leukocytes,Ua MODERATE (A) NEGATIVE   RBC / HPF 11-20 0 - 5 RBC/hpf   WBC, UA 6-10 0 - 5 WBC/hpf    Comment:        Reflex urine culture not performed if WBC <=10, OR if Squamous epithelial cells >5. If Squamous epithelial cells >5 suggest recollection.    Bacteria,  UA RARE (A) NONE SEEN   Squamous Epithelial / HPF 6-10 0 - 5 /HPF   Mucus PRESENT    Hyaline Casts, UA PRESENT     Comment: Performed at [facility], [address], [place], [place] [address]  Troponin I (High Sensitivity)     Status: None   Collection Time: 10/04/23  6:48 PM  Result Value Ref Range   Troponin I (High Sensitivity) 15 <18 ng/L    Comment: (NOTE) Elevated high sensitivity troponin I (hsTnI) values and significant  changes across serial measurements may suggest ACS but many other  chronic and acute conditions are known to elevate hsTnI results.  Refer to the "Links" section for chest pain algorithms and additional  guidance. Performed at [facility], [address], [place], [place] [address]   CBG monitoring, ED     Status: None   Collection Time: 10/05/23 12:09 AM  Result Value Ref Range   Glucose-Capillary 84 70 - 99 mg/dL    Comment: Glucose  reference range applies only to samples taken after fasting for at least 8 hours.  CBC     Status: Abnormal   Collection Time: 10/05/23  4:50 AM  Result Value Ref Range   WBC 6.6 4.0 - 10.5 K/uL   RBC 3.76 (L) 3.87 - 5.11 MIL/uL   Hemoglobin 11.7 (L) 12.0 - 15.0 g/dL   HCT 52.8 41.3 - 24.4 %   MCV 95.7 80.0 - 100.0 fL   MCH 31.1 26.0 - 34.0 pg   MCHC 32.5 30.0 - 36.0 g/dL   RDW 01.0 27.2 - 53.6 %   Platelets 260 150 - 400 K/uL   nRBC 0.0 0.0 - 0.2 %    Comment: Performed at [facility], [address] [place], [place] [address]  Comprehensive metabolic panel     Status: Abnormal   Collection Time: 10/05/23  4:50 AM  Result Value Ref Range   Sodium 139 135 - 145 mmol/L   Potassium 3.4 (L) 3.5 - 5.1 mmol/L   Chloride 107 98 - 111 mmol/L   CO2 27 22 - 32 mmol/L   Glucose, Bld 92 70 - 99 mg/dL    Comment: Glucose reference range applies only to samples taken after fasting for at least 8 hours.   BUN 11 6 - 20 mg/dL   Creatinine, Ser 4.74 0.44 - 1.00 mg/dL   Calcium 8.4 (L) 8.9 - 10.3 mg/dL   Total Protein 6.3 (L) 6.5 - 8.1 g/dL   Albumin 3.3 (L) 3.5 - 5.0 g/dL   AST 259 (H) 15 - 41 U/L   ALT 119 (H) 0 - 44 U/L   Alkaline Phosphatase 82 38 - 126 U/L   Total Bilirubin 0.9 0.0 - 1.2 mg/dL   GFR, Estimated >56 >38 mL/min    Comment: (NOTE) Calculated using the CKD-EPI Creatinine Equation (2021)    Anion gap 5 5 - 15    Comment: Performed at [facility], [address] [place], [place] [address]  Magnesium     Status: None   Collection Time: 10/05/23  4:50 AM  Result Value Ref Range   Magnesium 1.9 1.7 - 2.4 mg/dL    Comment: Performed at [facility], [address], [place], [place] [address]  Phosphorus     Status: None   Collection Time: 10/05/23  4:50 AM  Result Value Ref Range   Phosphorus 3.4 2.5 - 4.6 mg/dL  Comment: Performed at [facility], [address], [place], [place] [address]   HIV Antibody (routine testing w rflx)     Status: None   Collection Time: 10/05/23  4:50 AM  Result Value Ref Range   HIV Screen 4th Generation wRfx Non Reactive Non Reactive    Comment: Performed at [facility], [address] [place], [place] [address]  CBG monitoring, ED     Status: Abnormal   Collection Time: 10/05/23  8:16 AM  Result Value Ref Range   Glucose-Capillary 100 (H) 70 - 99 mg/dL    Comment: Glucose reference range applies only to samples taken after fasting for at least 8 hours.   CT ABDOMEN PELVIS W CONTRAST Result Date: 10/04/2023 CLINICAL DATA:  Presyncope and vomiting. EXAM: CT ABDOMEN AND PELVIS WITH CONTRAST TECHNIQUE: Multidetector CT imaging of the abdomen and pelvis was performed using the standard protocol following bolus administration of intravenous contrast. RADIATION DOSE REDUCTION: This exam was performed according to the departmental dose-optimization program which includes automated exposure control, adjustment of the mA and/or kV according to patient size and/or use of iterative reconstruction technique. CONTRAST:  OMNIPAQUE  IOHEXOL  300 MG/ML  SOLN COMPARISON:  May 31, 2021 FINDINGS: Lower chest: No acute abnormality. Hepatobiliary: No focal liver abnormality is seen. Status post cholecystectomy. No biliary dilatation. Pancreas: Unremarkable. No pancreatic ductal dilatation or surrounding inflammatory changes. Spleen: Normal in size without focal abnormality. Adrenals/Urinary Tract: Adrenal glands are unremarkable. Kidneys are normal, without renal calculi, focal lesion, or hydronephrosis. Bladder is unremarkable. Stomach/Bowel: Stomach is within normal limits. Appendix appears normal. No evidence of bowel wall thickening, distention, or inflammatory changes. Vascular/Lymphatic: No significant vascular findings are present. No enlarged abdominal or pelvic lymph nodes. Reproductive: Uterus and bilateral adnexa are unremarkable. Other: No abdominal  wall hernia or abnormality. No abdominopelvic ascites. Musculoskeletal: Bilateral breast implants are noted. No acute or significant osseous findings. IMPRESSION: No acute or active process within the abdomen or pelvis. Electronically Signed   By: Virgle Grime M.D.   On: 10/04/2023 23:06   Review of Systems  Constitutional:  Positive for appetite change. Negative for activity change, chills, diaphoresis, fatigue, fever and unexpected weight change.  HENT: Negative.    Eyes: Negative.   Respiratory: Negative.    Cardiovascular: Negative.   Gastrointestinal:  Positive for abdominal pain. Negative for abdominal distention, anal bleeding, blood in stool, constipation, diarrhea and nausea.  Endocrine: Negative.   Genitourinary: Negative.   Musculoskeletal:  Positive for arthralgias.  Allergic/Immunologic: Negative.   Neurological: Negative.   Hematological: Negative.   Psychiatric/Behavioral: Negative.     Blood pressure (!) 90/58, pulse 72, temperature 98.6 F (37 C), temperature source Oral, resp. rate 16, height 5\' 2"  (1.575 m), weight 70 kg, SpO2 96%. Physical Exam Constitutional:      Appearance: Normal appearance.  HENT:     Head: Normocephalic and atraumatic.     Nose: Nose normal.     Mouth/Throat:     Mouth: Mucous membranes are moist.  Eyes:     Extraocular Movements: Extraocular movements intact.     Pupils: Pupils are equal, round, and reactive to light.  Cardiovascular:     Rate and Rhythm: Normal rate and regular rhythm.     Pulses: Normal pulses.     Heart sounds: Normal heart sounds.  Pulmonary:     Effort: Pulmonary effort is normal.     Breath sounds: Normal breath sounds.  Abdominal:     General:  Abdomen is flat. There is no distension.     Palpations: Abdomen is soft. There is no mass.     Tenderness: There is abdominal tenderness. There is no guarding or rebound.     Hernia: No hernia is present.  Musculoskeletal:        General: Normal range of motion.      Cervical back: Normal range of motion and neck supple.  Skin:    General: Skin is warm and dry.  Neurological:     General: No focal deficit present.     Mental Status: She is alert and oriented to person, place, and time.  Psychiatric:        Mood and Affect: Mood normal.        Behavior: Behavior normal.        Thought Content: Thought content normal.        Judgment: Judgment normal.   Assessment/Plan: 1) Severe abdominal pain with nausea and vomiting, and 1 episode of diarrhea/elevated LFTs-this seems to be secondary to some foodborne illness as the patient was at her baseline health prior to eating her lunch yesterday.The increase in her LFTs may be due to dehydration with severe nausea and vomiting. Plans are to monitor her LFTs and if they start to improve tomorrow she can be discharged home with plans for outpatient follow-up. This has been discussed with the hospitalist's hospitalist Dr. Lesa Rape. 2) GERD-should be taking her PPI's on a regular basis avoid the use of all nonsteroidals including Meloxicam  as this can worsen her symptoms. 3) History of morbid obesity with a 40 pound weight loss over the last year on Mounjaro . 4) Status post bilateral mastectomies after being diagnosed with early-stage ductal carcinoma of the right breast 04/2021.  Had subsequent breast reconstruction done. 5) AODM-on Mounjaro . 6) Hypokalemia secondary to severe nausea and vomiting. 7) Asthma. 8) Anxiety and depression. 9) S/P cholecystectomy in 2019. Tami Falcon 10/05/2023, 11:40 AM

## 2023-10-05 NOTE — ED Notes (Signed)
 Pt is actively vomiting again. Attending MD notified.

## 2023-10-05 NOTE — ED Notes (Signed)
Report given to Natalie, RN

## 2023-10-05 NOTE — Plan of Care (Signed)

## 2023-10-05 NOTE — Progress Notes (Signed)
 PROGRESS NOTE Katelyn Reyes  WUJ:[id number] DOB: Jan 11, 1961 DOA: 10/04/2023 PCP: Jess Morita, MD  Brief Narrative/Hospital Course: 35 yof w/ hx of GERD, self-reported GI ulcers on PRN PPI,T2DM on Mounjaro , history of obesity, breast cancer s/p b/l mastectomy on anastrozole ,presented to the ER due to intractable nausea and vomiting onset around 2 PM, after eating noodles w/ associated upper abdominal pain. In the ED her nausea and vomiting persisted despite several rounds of IV antiemetics and IV opiate-based analgesics.  Additionally, the patient received IV PPI for self-reported history of unspecified GI ulcers.  Lipase and LFTs were within normal range. Contrasted CT abdomen and pelvis was unrevealing, s/p cholecystectomy, no biliary dilatation. GI was consulted.  Subjective: Seen and examined this morning Did not throw up since early morning, no BM yet has upper abdominal pain but overall feeling better Overnight afebrile BP 90s-120s Labs with mild hypokalemia overnight LFTs trending up ast 228, alt 119, tb Nl. Ua ketones in ed  Assessment and plan:  Intractable nausea and vomiting: History of GI ulcer per patient: Symptoms were acute onset, CT abdomen unrevealing, s/p CCY. GI consulted and discussed questioning food poisoning? Advance to full liquid diet and ADA T, cont bid PPI and IVF, antiemetics and pain control.  Transaminitis: LFTs trending overnight unclear etiology, ?reactive, no GB, no Biliary dilatation. Trend, fu gi inputs  Hypokalemia: Due to GI loss.  Replace and monitor   Asthma Cont home montelukast    Chronic anxiety/depression Resume home Xanax /zolpidem    Type 2 diabetes on Mounjaro : Blood sugar well-controlled, last A1c less than 5.5 in February,check  cbg bid Recent Labs  Lab 10/04/23 1705 10/05/23 0009 10/05/23 0816  GLUCAP 89 84 100*     DVT prophylaxis: enoxaparin  (LOVENOX ) injection 40 mg Start: 10/05/23 1000 Code Status:   Code  Status: Full Code Family Communication: plan of care discussed with patient at bedside. Patient status is: Remains hospitalized because of severity of illness Level of care: Med-Surg   Dispo: The patient is from: home            Anticipated disposition: TBD 1-2 days  Objective: Vitals last 24 hrs: Vitals:   10/05/23 0700 10/05/23 0804 10/05/23 0805 10/05/23 0944  BP: 96/64  94/64 (!) 90/58  Pulse: 77  83 72  Resp: 15  15 16   Temp:  98.9 F (37.2 C)  98.6 F (37 C)  TempSrc:  Oral  Oral  SpO2: 96%  96% 96%  Weight:  70 kg    Height:  5\' 2"  (1.575 m)     Weight change:   Physical Examination: General exam: alert awake, older than stated age HEENT:Oral mucosa moist, Ear/Nose WNL grossly Respiratory system: Bilaterally diminished BS, no use of accessory muscle Cardiovascular system: S1 & S2 +. Gastrointestinal system: Abdomen soft, mildly tender upper abdomen no guarding rigidity bowel sound present  Nervous System: Alert, awake,following commands. Extremities: LE edema neg, moving arms, warm legs Skin: No rashes,warm. MSK: Normal muscle bulk/tone.   Medications reviewed:  Scheduled Meds:  anastrozole   1 mg Oral Daily   enoxaparin  (LOVENOX ) injection  40 mg Subcutaneous Q24H   gabapentin   600 mg Oral QHS   montelukast   10 mg Oral Daily   pantoprazole  (PROTONIX ) IV  40 mg Intravenous BID   sertraline   100 mg Oral QHS   Continuous Infusions:  0.9 % NaCl with KCl 40 mEq / L 50 mL/hr at 10/05/23 0829   lactated ringers  Stopped (10/05/23 0821)   promethazine  (PHENERGAN ) injection (IM  or IVPB) Stopped (10/05/23 1610)      Diet Order             Diet full liquid Room service appropriate? Yes; Fluid consistency: Thin  Diet effective now                          Intake/Output Summary (Last 24 hours) at 10/05/2023 1415 Last data filed at 10/05/2023 1056 Gross per 24 hour  Intake 2706.28 ml  Output --  Net 2706.28 ml   Net IO Since Admission: 2,706.28 mL  [10/05/23 1415]  Wt Readings from Last 3 Encounters:  10/05/23 70 kg  07/29/23 76.8 kg  07/06/23 78.9 kg    Unresulted Labs (From admission, onward)     Start     Ordered   10/12/23 0500  Creatinine, serum  (enoxaparin  (LOVENOX )    CrCl >/= 30 ml/min)  Weekly,   R     Comments: while on enoxaparin  therapy    10/05/23 0046   10/06/23 0500  CBC  Daily,   R      10/05/23 0748   10/06/23 0500  Comprehensive metabolic panel with GFR  Daily,   R      10/05/23 0748          Data Reviewed: I have personally reviewed following labs and imaging studies ( see epic result tab) CBC: Recent Labs  Lab 10/04/23 1603 10/05/23 0450  WBC 7.9 6.6  NEUTROABS 6.2  --   HGB 12.6 11.7*  HCT 38.8 36.0  MCV 95.6 95.7  PLT 302 260   CMP: Recent Labs  Lab 10/04/23 1603 10/05/23 0450  NA 141 139  K 3.4* 3.4*  CL 108 107  CO2 25 27  GLUCOSE 97 92  BUN 16 11  CREATININE 0.86 0.76  CALCIUM 9.6 8.4*  MG  --  1.9  PHOS  --  3.4   GFR: Estimated Creatinine Clearance: 68.6 mL/min (by C-G formula based on SCr of 0.76 mg/dL). Recent Labs  Lab 10/04/23 1603 10/05/23 0450  AST 24 228*  ALT 21 119*  ALKPHOS 62 82  BILITOT 0.7 0.9  PROT 7.7 6.3*  ALBUMIN 4.3 3.3*   Recent Labs  Lab 10/04/23 1603  LIPASE 33   No results for input(s): "AMMONIA" in the last 168 hours. Coagulation Profile: No results for input(s): "INR", "PROTIME" in the last 168 hours. No results for input(s): "PROBNP" in the last 168 hours.  No results for input(s): "HGBA1C" in the last 72 hours. Recent Labs  Lab 10/04/23 1705 10/05/23 0009 10/05/23 0816  GLUCAP 89 84 100*   No results for input(s): "CHOL", "HDL", "LDLCALC", "TRIG", "CHOLHDL", "LDLDIRECT" in the last 72 hours. No results for input(s): "TSH", "T4TOTAL", "FREET4", "T3FREE", "THYROIDAB" in the last 72 hours. Sepsis Labs:No results for input(s): "PROCALCITON", "LATICACIDVEN" in the last 168 hours. No results found for this or any previous visit (from  the past 240 hours).  Antimicrobials/Microbiology: Anti-infectives (From admission, onward)    None         Component Value Date/Time   SDES URINE, CLEAN CATCH 12/24/2013 0600   SPECREQUEST none Normal 12/24/2013 0600   CULT NO GROWTH Performed at [facility] 12/24/2013 0600   REPTSTATUS 12/25/2013 FINAL 12/24/2013 0600    Radiology Studies: CT ABDOMEN PELVIS W CONTRAST Result Date: 10/04/2023 CLINICAL DATA:  Presyncope and vomiting. EXAM: CT ABDOMEN AND PELVIS WITH CONTRAST TECHNIQUE: Multidetector CT imaging of the abdomen and pelvis  was performed using the standard protocol following bolus administration of intravenous contrast. RADIATION DOSE REDUCTION: This exam was performed according to the departmental dose-optimization program which includes automated exposure control, adjustment of the mA and/or kV according to patient size and/or use of iterative reconstruction technique. CONTRAST:  OMNIPAQUE  IOHEXOL  300 MG/ML  SOLN COMPARISON:  May 31, 2021 FINDINGS: Lower chest: No acute abnormality. Hepatobiliary: No focal liver abnormality is seen. Status post cholecystectomy. No biliary dilatation. Pancreas: Unremarkable. No pancreatic ductal dilatation or surrounding inflammatory changes. Spleen: Normal in size without focal abnormality. Adrenals/Urinary Tract: Adrenal glands are unremarkable. Kidneys are normal, without renal calculi, focal lesion, or hydronephrosis. Bladder is unremarkable. Stomach/Bowel: Stomach is within normal limits. Appendix appears normal. No evidence of bowel wall thickening, distention, or inflammatory changes. Vascular/Lymphatic: No significant vascular findings are present. No enlarged abdominal or pelvic lymph nodes. Reproductive: Uterus and bilateral adnexa are unremarkable. Other: No abdominal wall hernia or abnormality. No abdominopelvic ascites. Musculoskeletal: Bilateral breast implants are noted. No acute or significant osseous findings.  IMPRESSION: No acute or active process within the abdomen or pelvis. Electronically Signed   By: Virgle Grime M.D.   On: 10/04/2023 23:06    LOS: 0 days    Total time spent in review of labs and imaging, patient evaluation, formulation of plan, documentation and communication with patient/family: 35 minutes  Lesa Rape, MD [facility] 10/05/2023, 2:15 PM

## 2023-10-05 NOTE — ED Notes (Signed)
 Assisted patient to restroom ambulatory. No signs shortness of breath, verbalized she is in pain and nauseated.

## 2023-10-06 ENCOUNTER — Other Ambulatory Visit (HOSPITAL_COMMUNITY): Payer: Self-pay

## 2023-10-06 DIAGNOSIS — R112 Nausea with vomiting, unspecified: Secondary | ICD-10-CM | POA: Diagnosis not present

## 2023-10-06 LAB — COMPREHENSIVE METABOLIC PANEL WITH GFR
ALT: 87 U/L — ABNORMAL HIGH (ref 0–44)
AST: 68 U/L — ABNORMAL HIGH (ref 15–41)
Albumin: 3.2 g/dL — ABNORMAL LOW (ref 3.5–5.0)
Alkaline Phosphatase: 85 U/L (ref 38–126)
Anion gap: 6 (ref 5–15)
BUN: 10 mg/dL (ref 6–20)
CO2: 24 mmol/L (ref 22–32)
Calcium: 8.3 mg/dL — ABNORMAL LOW (ref 8.9–10.3)
Chloride: 104 mmol/L (ref 98–111)
Creatinine, Ser: 0.75 mg/dL (ref 0.44–1.00)
GFR, Estimated: >60 mL/min (ref >60)
Glucose, Bld: 90 mg/dL (ref 70–99)
Potassium: 3.4 mmol/L — ABNORMAL LOW (ref 3.5–5.1)
Sodium: 134 mmol/L — ABNORMAL LOW (ref 135–145)
Total Bilirubin: 0.6 mg/dL (ref 0.0–1.2)
Total Protein: 6 g/dL — ABNORMAL LOW (ref 6.5–8.1)

## 2023-10-06 LAB — CBC
HCT: 35 % — ABNORMAL LOW (ref 36.0–46.0)
Hemoglobin: 11 g/dL — ABNORMAL LOW (ref 12.0–15.0)
MCH: 30.5 pg (ref 26.0–34.0)
MCHC: 31.4 g/dL (ref 30.0–36.0)
MCV: 97 fL (ref 80.0–100.0)
Platelets: 208 10*3/uL (ref 150–400)
RBC: 3.61 MIL/uL — ABNORMAL LOW (ref 3.87–5.11)
RDW: 12 % (ref 11.5–15.5)
WBC: 6.6 10*3/uL (ref 4.0–10.5)
nRBC: 0 % (ref 0.0–0.2)

## 2023-10-06 MED ORDER — ONDANSETRON HCL 4 MG PO TABS
4.0000 mg | ORAL_TABLET | Freq: Three times a day (TID) | ORAL | 0 refills | Status: AC | PRN
  Filled 2023-10-06: qty 20, 7d supply, fill #0

## 2023-10-06 MED ORDER — POTASSIUM CHLORIDE CRYS ER 20 MEQ PO TBCR
40.0000 meq | EXTENDED_RELEASE_TABLET | Freq: Once | ORAL | Status: AC
  Administered 2023-10-06: 40 meq via ORAL
  Filled 2023-10-06: qty 2

## 2023-10-06 NOTE — TOC Initial Note (Signed)
 Transition of Care [facility]) - Initial/Assessment Note    Patient Details  Name: Katelyn Reyes MRN: [id number] Date of Birth: [birth year]  Transition of Care [facility]) CM/SW Contact:    Kathryn Parish, RN Phone Number: 10/06/2023, 7:45 AM  Clinical Narrative:                 Spoke to pt in room; pt says she lives at home w/ her mother; pt says she plans to return at d/c; her mother will provide transportation; she verified insurance/PCP; she denies SDOH risks; pt says she does not have DME, HH services, or home oxygen; no TOC needs; TOC signing off; please place consult if needed.  Expected Discharge Plan: Home/Self Care Barriers to Discharge: No Barriers Identified   Patient Goals and CMS Choice Patient states their goals for this hospitalization and ongoing recovery are:: home          Expected Discharge Plan and Services In-house Referral: NA Discharge Planning Services: NA Post Acute Care Choice: NA Living arrangements for the past 2 months: Single Family Home                 DME Arranged: N/A DME Agency: NA       HH Arranged: NA HH Agency: NA        Prior Living Arrangements/Services Living arrangements for the past 2 months: Single Family Home Lives with:: Parents Patient language and need for interpreter reviewed:: Yes Do you feel safe going back to the place where you live?: Yes      Need for Family Participation in Patient Care: No (Comment) Care giver support system in place?: Yes (comment)   Criminal Activity/Legal Involvement Pertinent to Current Situation/Hospitalization: No - Comment as needed  Activities of Daily Living   ADL Screening (condition at time of admission) Independently performs ADLs?: Yes (appropriate for developmental age) Is the patient deaf or have difficulty hearing?: No Does the patient have difficulty seeing, even when wearing glasses/contacts?: No Does the patient have difficulty concentrating, remembering, or making  decisions?: No  Permission Sought/Granted Permission sought to share information with : Case Manager Permission granted to share information with : Yes, Verbal Permission Granted  Share Information with NAME: Yahira Timberman     Permission granted to share info w Relationship: Mother  Permission granted to share info w Contact Information: [phone]  Emotional Assessment Appearance:: Appears stated age Attitude/Demeanor/Rapport: Engaged Affect (typically observed): Accepting, Appropriate Orientation: : Oriented to Self, Oriented to Place, Oriented to  Time, Oriented to Situation Alcohol / Substance Use: Not Applicable Psych Involvement: No (comment)  Admission diagnosis:  Intractable nausea and vomiting [R11.2] Postural dizziness with presyncope [R42, R55] Nausea and vomiting, unspecified vomiting type [R11.2] Patient Active Problem List   Diagnosis Date Noted   Intractable nausea and vomiting 10/04/2023   Prediabetes 01/19/2023   Nontraumatic complete tear of right rotator cuff    Biceps tendinitis of right upper extremity    Breast cancer, right (HCC) 04/26/2021   Family history of breast cancer 04/12/2021   Malignant neoplasm of upper outer quadrant of female breast (HCC) 04/12/2021   Family history of colon cancer 04/12/2021   Family history of ovarian cancer 04/12/2021   Metabolic syndrome 02/25/2021   Diverticular disease of colon 07/17/2020   Family history of malignant neoplasm of gastrointestinal tract 07/17/2020   Fatty liver 07/17/2020   Nonalcoholic steatohepatitis (NASH) 07/17/2020   History of colonic polyps 07/17/2020   Acute medial meniscal tear, right, subsequent encounter 05/01/2020  ETD (Eustachian tube dysfunction), bilateral 12/27/2019   Gastroesophageal reflux disease 03/25/2019   Obesity (BMI 30-39.9) 07/12/2018   Cervical spondylosis with radiculopathy 11/12/2017   Vitamin D  deficiency 07/07/2017   Urinary incontinence in female 12/22/2013    Asthma, mild intermittent 03/22/2013   Vaginal itching 06/01/2012   Thyromegaly 03/18/2012   General medical examination 03/14/2011   CHICKENPOX, HX OF 02/15/2010   Hyperlipidemia 02/14/2009   DEPRESSION 02/14/2009   PCP:  Jess Morita, MD Pharmacy:   [facility] [address] [place] [address] Phone: [phone] Fax: [phone]  [facility] [address] [place] [address] Phone: [phone] Fax: [phone]  [id number] [address] [place] [facility] [address] [place] [address] Phone: [phone] Fax: [phone]  [facility] [address] [place] [address] Phone: [phone] Fax: [phone]  [facility] [address] [place] [address] Phone: [phone] Fax: [phone]     Social Drivers of Health (SDOH) Social History: SDOH Screenings   Food Insecurity: No Food Insecurity (10/05/2023)  Housing: Low Risk  (10/05/2023)  Transportation Needs: No Transportation Needs (10/05/2023)  Utilities: Not At Risk (10/05/2023)  Depression (PHQ2-9): Low Risk  (07/29/2023)  Tobacco Use: Low Risk  (10/04/2023)   SDOH Interventions:     Readmission Risk Interventions     No data to display

## 2023-10-06 NOTE — Progress Notes (Signed)
 Patient discharged home, family to facilitate transportation. IV removed, tolerated well. Discharge packet given and instructions reviewed. Questions answered. F/u appointments discussed. Belongings with patient.

## 2023-10-06 NOTE — Plan of Care (Signed)

## 2023-10-06 NOTE — Progress Notes (Signed)
 Discharge medication delivered to patient at bedside [facility]

## 2023-10-06 NOTE — Discharge Summary (Signed)
 Physician Discharge Summary  Katelyn Reyes:[id number] DOB: 06/24/1960 DOA: 10/04/2023  PCP: Jess Morita, MD  Admit date: 10/04/2023 Discharge date: 10/06/2023 Recommendations for Outpatient Follow-up:  Follow up with PCP in 1 weeks-call for appointment Please obtain BMP/CBC in one week  Discharge Dispo: home Discharge Condition: Stable Code Status:   Code Status: Full Code Diet recommendation:  Diet Order             Diet regular Room service appropriate? Yes; Fluid consistency: Thin  Diet effective now                    Brief/Interim Summary: 62 yof w/ hx of GERD, self-reported GI ulcers on PRN PPI,T2DM on Mounjaro , history of obesity, breast cancer s/p b/l mastectomy on anastrozole ,presented to the ER due to intractable nausea and vomiting onset around 2 PM, after eating noodles w/ associated upper abdominal pain. In the ED her nausea and vomiting persisted despite several rounds of IV antiemetics and IV opiate-based analgesics.  Additionally, the patient received IV PPI for self-reported history of unspecified GI ulcers.  Lipase and LFTs were within normal range. Contrasted CT abdomen and pelvis was unrevealing, s/p cholecystectomy, no biliary dilatation. GI was consulted-likely secondary to some foodborne illness as the patient was at her baseline prior to eating her lunch, LFTs worsened likely reactive.  Patient was slowly started on diet tolerating at this time no further GI workup pending okay for discharge once tolerating diet.   Subjective: Seen and examined Resting comfortably no complaint Overnight labs remained stable  Has been able to tolerate diet  Discharge diagnosis: Intractable nausea and vomiting and abdominal pain History of GI ulcer per patient Suspected foodborne illness: Symptoms were acute onset after eating food- CT abdomen unrevealing, s/p CCY.  Seen by GI likely foodborne illness at this time symptoms reasonable tolerating diet plan for  outpatient follow-up and discharge on home regimen if tolerating diet Discussed plan of care with GI cont bid PPI  Transaminitis: Downtrending likely reactive.  Follow-up outpatient   Hypokalemia: Replaced.     Asthma Cont home montelukast    Chronic anxiety/depression Continue home Xanax /zolpidem    Type 2 diabetes on Mounjaro : Blood sugar well-controlled, last A1c less than 5.5 in February,check  cbg bid Recent Labs  Lab 10/04/23 1705 10/05/23 0009 10/05/23 0816 10/05/23 1656  GLUCAP 89 84 100* 92    Discharge Exam: Vitals:   10/05/23 2145 10/06/23 0444  BP: 114/79 97/81  Pulse: 81 79  Resp: 18 17  Temp: 99.8 F (37.7 C) 98.4 F (36.9 C)  SpO2: 98% 99%   General: Pt is alert, awake, not in acute distress Cardiovascular: RRR, S1/S2 +, no rubs, no gallops Respiratory: CTA bilaterally, no wheezing, no rhonchi Abdominal: Soft, NT, ND, bowel sounds + Extremities: no edema, no cyanosis  Discharge Instructions  Discharge Instructions     Discharge instructions   Complete by: As directed    Please call call MD or return to ER for similar or worsening recurring problem that brought you to hospital or if any fever,nausea/vomiting,abdominal pain, uncontrolled pain, chest pain,  shortness of breath or any other alarming symptoms.  Please follow-up your doctor as instructed in a week time and call the office for appointment.  Please avoid alcohol, smoking, or any other illicit substance and maintain healthy habits including taking your regular medications as prescribed.  You were cared for by a hospitalist during your hospital stay. If you have any questions about your discharge medications  or the care you received while you were in the hospital after you are discharged, you can call the unit and ask to speak with the hospitalist on call if the hospitalist that took care of you is not available.  Once you are discharged, your primary care physician will handle any further  medical issues. Please note that NO REFILLS for any discharge medications will be authorized once you are discharged, as it is imperative that you return to your primary care physician (or establish a relationship with a primary care physician if you do not have one) for your aftercare needs so that they can reassess your need for medications and monitor your lab values   Increase activity slowly   Complete by: As directed       Allergies as of 10/06/2023       Reactions   Amoxicillin  Rash, Other (See Comments)   Has patient had a PCN reaction causing immediate rash, facial/tongue/throat swelling, SOB or lightheadedness with hypotension: No Has patient had a PCN reaction causing severe rash involving mucus membranes or skin necrosis: No Has patient had a PCN reaction that required treatment: #  #  #  YES  #  #  # - MD office Has patient had a PCN reaction occurring within the last 10 years: Yes If all of the above answers are "NO", then may proceed with Cephalosporin use.   Erythromycin Nausea Only   Hydrocodone-acetaminophen  Other (See Comments)   Hallucinations   Penicillin G Rash   Shellfish Allergy Itching, Other (See Comments)   REACTS TO SCALLOPS        Medication List     TAKE these medications    albuterol  108 (90 Base) MCG/ACT inhaler Commonly known as: VENTOLIN  HFA Inhale 2 puffs into the lungs every 6 (six) hours as needed for wheezing.   ALPRAZolam  0.5 MG tablet Commonly known as: XANAX  Take 1 tablet (0.5 mg total) by mouth 3 (three) times daily as needed.   anastrozole  1 MG tablet Commonly known as: ARIMIDEX  Take 1 tablet (1 mg total) by mouth daily.   cetirizine  10 MG tablet Commonly known as: ZYRTEC  Take 1 tablet (10 mg total) by mouth daily.   fluticasone  50 MCG/ACT nasal spray Commonly known as: FLONASE  Place 2 sprays into both nostrils daily.   gabapentin  300 MG capsule Commonly known as: NEURONTIN  Take 2 capsules (600 mg total) by mouth at  bedtime.   MAGNESIUM PO Take by mouth as directed. L-threonate 1000 mg nightly   montelukast  10 MG tablet Commonly known as: SINGULAIR  Take 1 tablet (10 mg total) by mouth daily.   Mounjaro  15 MG/0.5ML Pen Generic drug: tirzepatide  Inject 15 mg into the skin once a week.   ondansetron  4 MG tablet Commonly known as: Zofran  Take 1 tablet (4 mg total) by mouth every 8 (eight) hours as needed for up to 20 doses for nausea or vomiting.   pantoprazole  40 MG tablet Commonly known as: PROTONIX  Take 1 tablet (40 mg total) by mouth 2 (two) times daily. What changed:  when to take this reasons to take this   sertraline  100 MG tablet Commonly known as: ZOLOFT  Take 1 tablet (100 mg total) by mouth daily.   TechLite Pen Needles 32G X 4 MM Misc Generic drug: Insulin  Pen Needle Use daily w/ saxenda    tiZANidine  4 MG tablet Commonly known as: Zanaflex  Take 1 tablet (4 mg total) by mouth 2 (two) times daily.   Vitamin D -3 125 MCG (5000 UT)  Tabs Take 5,000 Units by mouth every evening.   zolpidem  10 MG tablet Commonly known as: AMBIEN  Take 1 tablet (10 mg total) by mouth at bedtime as needed for sleep.        Allergies  Allergen Reactions   Amoxicillin  Rash and Other (See Comments)    Has patient had a PCN reaction causing immediate rash, facial/tongue/throat swelling, SOB or lightheadedness with hypotension: No Has patient had a PCN reaction causing severe rash involving mucus membranes or skin necrosis: No Has patient had a PCN reaction that required treatment: #  #  #  YES  #  #  # - MD office Has patient had a PCN reaction occurring within the last 10 years: Yes If all of the above answers are "NO", then may proceed with Cephalosporin use.    Erythromycin Nausea Only   Hydrocodone-Acetaminophen  Other (See Comments)    Hallucinations   Penicillin G Rash   Shellfish Allergy Itching and Other (See Comments)    REACTS TO SCALLOPS     The results of significant  diagnostics from this hospitalization (including imaging, microbiology, ancillary and laboratory) are listed below for reference.    Microbiology: No results found for this or any previous visit (from the past 240 hours).  Procedures/Studies: CT ABDOMEN PELVIS W CONTRAST Result Date: 10/04/2023 CLINICAL DATA:  Presyncope and vomiting. EXAM: CT ABDOMEN AND PELVIS WITH CONTRAST TECHNIQUE: Multidetector CT imaging of the abdomen and pelvis was performed using the standard protocol following bolus administration of intravenous contrast. RADIATION DOSE REDUCTION: This exam was performed according to the departmental dose-optimization program which includes automated exposure control, adjustment of the mA and/or kV according to patient size and/or use of iterative reconstruction technique. CONTRAST:  OMNIPAQUE  IOHEXOL  300 MG/ML  SOLN COMPARISON:  May 31, 2021 FINDINGS: Lower chest: No acute abnormality. Hepatobiliary: No focal liver abnormality is seen. Status post cholecystectomy. No biliary dilatation. Pancreas: Unremarkable. No pancreatic ductal dilatation or surrounding inflammatory changes. Spleen: Normal in size without focal abnormality. Adrenals/Urinary Tract: Adrenal glands are unremarkable. Kidneys are normal, without renal calculi, focal lesion, or hydronephrosis. Bladder is unremarkable. Stomach/Bowel: Stomach is within normal limits. Appendix appears normal. No evidence of bowel wall thickening, distention, or inflammatory changes. Vascular/Lymphatic: No significant vascular findings are present. No enlarged abdominal or pelvic lymph nodes. Reproductive: Uterus and bilateral adnexa are unremarkable. Other: No abdominal wall hernia or abnormality. No abdominopelvic ascites. Musculoskeletal: Bilateral breast implants are noted. No acute or significant osseous findings. IMPRESSION: No acute or active process within the abdomen or pelvis. Electronically Signed   By: Virgle Grime M.D.   On:  10/04/2023 23:06    Labs: BNP (last 3 results) No results for input(s): "BNP" in the last 8760 hours. Basic Metabolic Panel: Recent Labs  Lab 10/04/23 1603 10/05/23 0450 10/06/23 0438  NA 141 139 134*  K 3.4* 3.4* 3.4*  CL 108 107 104  CO2 25 27 24   GLUCOSE 97 92 90  BUN 16 11 10   CREATININE 0.86 0.76 0.75  CALCIUM 9.6 8.4* 8.3*  MG  --  1.9  --   PHOS  --  3.4  --    Liver Function Tests: Recent Labs  Lab 10/04/23 1603 10/05/23 0450 10/06/23 0438  AST 24 228* 68*  ALT 21 119* 87*  ALKPHOS 62 82 85  BILITOT 0.7 0.9 0.6  PROT 7.7 6.3* 6.0*  ALBUMIN 4.3 3.3* 3.2*   Recent Labs  Lab 10/04/23 1603  LIPASE 33   No  results for input(s): "AMMONIA" in the last 168 hours. CBC: Recent Labs  Lab 10/04/23 1603 10/05/23 0450 10/06/23 0438  WBC 7.9 6.6 6.6  NEUTROABS 6.2  --   --   HGB 12.6 11.7* 11.0*  HCT 38.8 36.0 35.0*  MCV 95.6 95.7 97.0  PLT 302 260 208   Cardiac Enzymes: Recent Labs  Lab 10/04/23 1603  CKTOTAL 146   BNP: Invalid input(s): "POCBNP" CBG: Recent Labs  Lab 10/04/23 1705 10/05/23 0009 10/05/23 0816 10/05/23 1656  GLUCAP 89 84 100* 92  Urinalysis    Component Value Date/Time   COLORURINE YELLOW 10/04/2023 1838   APPEARANCEUR HAZY (A) 10/04/2023 1838   LABSPEC 1.029 10/04/2023 1838   PHURINE 5.0 10/04/2023 1838   GLUCOSEU NEGATIVE 10/04/2023 1838   HGBUR SMALL (A) 10/04/2023 1838   BILIRUBINUR NEGATIVE 10/04/2023 1838   KETONESUR 5 (A) 10/04/2023 1838   PROTEINUR 30 (A) 10/04/2023 1838   UROBILINOGEN 0.2 12/24/2013 0600   NITRITE NEGATIVE 10/04/2023 1838   LEUKOCYTESUR MODERATE (A) 10/04/2023 1838   Sepsis Labs Recent Labs  Lab 10/04/23 1603 10/05/23 0450 10/06/23 0438  WBC 7.9 6.6 6.6   Microbiology No results found for this or any previous visit (from the past 240 hours).   Time coordinating discharge: 25 minutes  SIGNED: Lesa Rape, MD  [facility] 10/06/2023, 2:00 PM  If 7PM-7AM, please contact  night-coverage [email or web address]

## 2023-10-07 ENCOUNTER — Telehealth: Payer: Self-pay

## 2023-10-07 NOTE — Transitions of Care (Post Inpatient/ED Visit) (Signed)
 10/07/2023  Name: Katelyn Reyes MRN: [id number] DOB: 05-Jun-1961  Today's TOC FU Call Status: Today's TOC FU Call Status:: Successful TOC FU Call Completed TOC FU Call Complete Date: 10/07/23 Patient's Name and Date of Birth confirmed.  Transition Care Management Follow-up Telephone Call Date of Discharge: 10/06/23 Discharge Facility: [facility]) Type of Discharge: Inpatient Admission Primary Inpatient Discharge Diagnosis:: Nausea and Vomiting How have you been since you were released from the hospital?: Better Any questions or concerns?: No  Items Reviewed: Did you receive and understand the discharge instructions provided?: Yes Medications obtained,verified, and reconciled?: Yes (Medications Reviewed) Any new allergies since your discharge?: No Dietary orders reviewed?: NA Do you have support at home?: Yes  Medications Reviewed Today: Medications Reviewed Today     Reviewed by Cathye Coca, LPN (Licensed Practical Nurse) on 10/07/23 at [phone]  Med List Status: <None>   Medication Order Taking? Sig Documenting Provider Last Dose Status Informant  albuterol  (VENTOLIN  HFA) 108 (90 Base) MCG/ACT inhaler [id number] Yes Inhale 2 puffs into the lungs every 6 (six) hours as needed for wheezing. Jess Morita, MD Taking Active Self, Pharmacy Records  ALPRAZolam  (XANAX ) 0.5 MG tablet [id number] Yes Take 1 tablet (0.5 mg total) by mouth 3 (three) times daily as needed. Jess Morita, MD Taking Active Self, Pharmacy Records  anastrozole  (ARIMIDEX ) 1 MG tablet [id number] Yes Take 1 tablet (1 mg total) by mouth daily. Ivor Mars, MD Taking Active Self, Pharmacy Records  cetirizine  (ZYRTEC ) 10 MG tablet [id number] Yes Take 1 tablet (10 mg total) by mouth daily. Tabori, Katherine E, MD Taking Active Self, Pharmacy Records  Cholecalciferol  (VITAMIN D -3) 125 MCG (5000 UT) TABS [id number] Yes Take 5,000 Units by mouth every evening. [provider] Taking Active  Self, Pharmacy Records  fluticasone  (FLONASE ) 50 MCG/ACT nasal spray [id number] Yes Place 2 sprays into both nostrils daily. Jess Morita, MD Taking Active Self, Pharmacy Records  gabapentin  (NEURONTIN ) 300 MG capsule [id number] Yes Take 2 capsules (600 mg total) by mouth at bedtime. Ivor Mars, MD Taking Active Self, Pharmacy Records  Insulin  Pen Needle 32G X 4 MM MISC [id number] Yes Use daily w/ saxenda  Tabori, Katherine E, MD Taking Active Self, Pharmacy Records  MAGNESIUM PO [id number] Yes Take by mouth as directed. L-threonate 1000 mg nightly [provider] Taking Active Self, Pharmacy Records  montelukast  (SINGULAIR ) 10 MG tablet [id number] Yes Take 1 tablet (10 mg total) by mouth daily. Jess Morita, MD Taking Active Self, Pharmacy Records  ondansetron  (ZOFRAN ) 4 MG tablet [id number] Yes Take 1 tablet (4 mg total) by mouth every 8 (eight) hours as needed for up to 20 doses for nausea or vomiting. Lesa Rape, MD Taking Active   pantoprazole  (PROTONIX ) 40 MG tablet [id number] Yes Take 1 tablet (40 mg total) by mouth 2 (two) times daily.  Patient taking differently: Take 40 mg by mouth daily as needed.   Jess Morita, MD Taking Active Self, Pharmacy Records  sertraline  (ZOLOFT ) 100 MG tablet [id number] Yes Take 1 tablet (100 mg total) by mouth daily. Jess Morita, MD Taking Active Self, Pharmacy Records  tirzepatide  (MOUNJARO ) 15 MG/0.5ML Pen [id number] Yes Inject 15 mg into the skin once a week.  Patient taking differently: Inject 15 mg into the skin once a week. Wed   Tabori, Katherine E, MD Taking Active Self, Pharmacy Records  tiZANidine  (ZANAFLEX ) 4 MG tablet [id number] Yes Take 1 tablet (4 mg total) by mouth 2 (two) times daily.  Taking Active Self, Pharmacy Records           Med Note (CROWDER, DIAMOND   Mon Jan 19, 2023  7:50 AM) PRN  zolpidem  (AMBIEN ) 10 MG tablet [id number] Yes Take 1 tablet (10 mg total) by mouth at bedtime as needed for sleep. Jess Morita, MD Taking Active Self, Pharmacy Records            Home Care and Equipment/Supplies: Were Home Health Services Ordered?: NA Any new equipment or medical supplies ordered?: NA  Functional Questionnaire: Do you need assistance with bathing/showering or dressing?: No Do you need assistance with meal preparation?: No Do you need assistance with eating?: No Do you have difficulty maintaining continence: No Do you need assistance with getting out of bed/getting out of a chair/moving?: No Do you have difficulty managing or taking your medications?: No  Follow up appointments reviewed: PCP Follow-up appointment confirmed?: Yes Date of PCP follow-up appointment?: 10/19/23 Follow-up Provider: Dr. Paulla Bossier Specialist [facility] Follow-up appointment confirmed?: NA Do you need transportation to your follow-up appointment?: No Do you understand care options if your condition(s) worsen?: Yes-patient verbalized understanding    SIGNATURE [place]ron [place], LPN [facility] Health Advisor [place] [facility] You Are. We Are. [facility] Direct Dial [phone]

## 2023-10-08 ENCOUNTER — Encounter: Payer: Self-pay | Admitting: Family

## 2023-10-08 DIAGNOSIS — Z860100 Personal history of colon polyps, unspecified: Secondary | ICD-10-CM | POA: Diagnosis not present

## 2023-10-08 DIAGNOSIS — R748 Abnormal levels of other serum enzymes: Secondary | ICD-10-CM | POA: Diagnosis not present

## 2023-10-08 DIAGNOSIS — D649 Anemia, unspecified: Secondary | ICD-10-CM | POA: Diagnosis not present

## 2023-10-08 DIAGNOSIS — K760 Fatty (change of) liver, not elsewhere classified: Secondary | ICD-10-CM | POA: Diagnosis not present

## 2023-10-08 DIAGNOSIS — K5730 Diverticulosis of large intestine without perforation or abscess without bleeding: Secondary | ICD-10-CM | POA: Diagnosis not present

## 2023-10-17 ENCOUNTER — Other Ambulatory Visit (HOSPITAL_COMMUNITY): Payer: Self-pay

## 2023-10-19 ENCOUNTER — Other Ambulatory Visit: Payer: Self-pay

## 2023-10-19 ENCOUNTER — Ambulatory Visit: Admitting: Family Medicine

## 2023-10-19 ENCOUNTER — Encounter: Payer: Self-pay | Admitting: Family Medicine

## 2023-10-19 ENCOUNTER — Other Ambulatory Visit (HOSPITAL_COMMUNITY): Payer: Self-pay

## 2023-10-19 VITALS — BP 122/68 | HR 78 | Temp 98.0°F | Wt 157.5 lb

## 2023-10-19 DIAGNOSIS — Z87898 Personal history of other specified conditions: Secondary | ICD-10-CM

## 2023-10-19 DIAGNOSIS — R252 Cramp and spasm: Secondary | ICD-10-CM

## 2023-10-19 DIAGNOSIS — D649 Anemia, unspecified: Secondary | ICD-10-CM | POA: Insufficient documentation

## 2023-10-19 DIAGNOSIS — R112 Nausea with vomiting, unspecified: Secondary | ICD-10-CM | POA: Diagnosis not present

## 2023-10-19 DIAGNOSIS — R748 Abnormal levels of other serum enzymes: Secondary | ICD-10-CM | POA: Insufficient documentation

## 2023-10-19 LAB — BASIC METABOLIC PANEL WITH GFR
BUN: 13 mg/dL (ref 6–23)
CO2: 30 meq/L (ref 19–32)
Calcium: 9.3 mg/dL (ref 8.4–10.5)
Chloride: 105 meq/L (ref 96–112)
Creatinine, Ser: 0.84 mg/dL (ref 0.40–1.20)
GFR: 75.55 mL/min (ref >60.00)
Glucose, Bld: 83 mg/dL (ref 70–99)
Potassium: 4.3 meq/L (ref 3.5–5.1)
Sodium: 141 meq/L (ref 135–145)

## 2023-10-19 LAB — HEPATIC FUNCTION PANEL
ALT: 19 U/L (ref 0–35)
AST: 22 U/L (ref 0–37)
Albumin: 4.1 g/dL (ref 3.5–5.2)
Alkaline Phosphatase: 70 U/L (ref 39–117)
Bilirubin, Direct: 0.1 mg/dL (ref 0.0–0.3)
Total Bilirubin: 0.5 mg/dL (ref 0.2–1.2)
Total Protein: 7.2 g/dL (ref 6.0–8.3)

## 2023-10-19 LAB — CBC WITH DIFFERENTIAL/PLATELET
Basophils Absolute: 0 10*3/uL (ref 0.0–0.1)
Basophils Relative: 0.5 % (ref 0.0–3.0)
Eosinophils Absolute: 0.2 10*3/uL (ref 0.0–0.7)
Eosinophils Relative: 3.6 % (ref 0.0–5.0)
HCT: 37.1 % (ref 36.0–46.0)
Hemoglobin: 12.2 g/dL (ref 12.0–15.0)
Lymphocytes Relative: 44.7 % (ref 12.0–46.0)
Lymphs Abs: 2.4 10*3/uL (ref 0.7–4.0)
MCHC: 32.8 g/dL (ref 30.0–36.0)
MCV: 93.3 fl (ref 78.0–100.0)
Monocytes Absolute: 0.3 10*3/uL (ref 0.1–1.0)
Monocytes Relative: 4.7 % (ref 3.0–12.0)
Neutro Abs: 2.5 10*3/uL (ref 1.4–7.7)
Neutrophils Relative %: 46.5 % (ref 43.0–77.0)
Platelets: 353 10*3/uL (ref 150.0–400.0)
RBC: 3.98 Mil/uL (ref 3.87–5.11)
RDW: 13.1 % (ref 11.5–15.5)
WBC: 5.5 10*3/uL (ref 4.0–10.5)

## 2023-10-19 MED ORDER — ZOLPIDEM TARTRATE 10 MG PO TABS
10.0000 mg | ORAL_TABLET | Freq: Every evening | ORAL | 3 refills | Status: DC | PRN
  Filled 2023-10-19: qty 30, 30d supply, fill #0
  Filled 2023-11-19: qty 30, 30d supply, fill #1
  Filled 2023-12-17: qty 30, 30d supply, fill #2
  Filled 2024-01-20: qty 30, 30d supply, fill #3

## 2023-10-19 MED ORDER — ZOLPIDEM TARTRATE 10 MG PO TABS
10.0000 mg | ORAL_TABLET | Freq: Every evening | ORAL | 3 refills | Status: DC | PRN
  Filled 2023-10-19: qty 30, 30d supply, fill #0

## 2023-10-19 NOTE — Assessment & Plan Note (Signed)
 Resolved.  Working [employer]ara Bolls is food borne illness.  Thankfully pt has fully recovered and she is eating and drinking normally.  Having some leg cramps.  Encouraged increased hydration and addition of electrolytes prn.  Repeat CMP and CBC.  Reviewed supportive care and red flags that should prompt return.  Pt expressed understanding and is in agreement w/ plan.

## 2023-10-19 NOTE — Progress Notes (Addendum)
   Subjective:    Patient ID: Katelyn Reyes, female    DOB: 19-Oct-1960, 63 y.o.   MRN: [id number]  HPI Hospital f/u- admitted 4/20- 22.  pt presented to ER on 4/20 w/ N/V/abd pain.  Sxs persisted despite IV antiemetics, opioids, and PPI.  CT abd/pelvis was unrevealing.  LFTs initially worsened but GI felt this was reactive.  Her diet was slowly advanced and GI ok'd for d/c.  It was thought she had a food borne illness.  D/C summary recommended CMP, CBC.  Sent home on PPI BID  Pt reports feeling better.  Has some leg cramps since d/c.  Pt reports eating and drinking regularly.  No longer having N/V.  H&P, D/C summary, labs, and imaging reviewed.   Review of Systems For ROS see HPI     Objective:   Physical Exam Vitals reviewed.  Constitutional:      General: She is not in acute distress.    Appearance: Normal appearance. She is well-developed. She is not ill-appearing.  HENT:     Head: Normocephalic and atraumatic.  Eyes:     Conjunctiva/sclera: Conjunctivae normal.     Pupils: Pupils are equal, round, and reactive to light.  Neck:     Thyroid : No thyromegaly.  Cardiovascular:     Rate and Rhythm: Normal rate and regular rhythm.     Pulses: Normal pulses.     Heart sounds: Normal heart sounds. No murmur heard. Pulmonary:     Effort: Pulmonary effort is normal. No respiratory distress.     Breath sounds: Normal breath sounds.  Abdominal:     General: There is no distension.     Palpations: Abdomen is soft.     Tenderness: There is no abdominal tenderness. There is no guarding or rebound.  Musculoskeletal:     Cervical back: Normal range of motion and neck supple.  Lymphadenopathy:     Cervical: No cervical adenopathy.  Skin:    General: Skin is warm and dry.  Neurological:     General: No focal deficit present.     Mental Status: She is alert and oriented to person, place, and time.  Psychiatric:        Mood and Affect: Mood normal.        Behavior: Behavior normal.         Thought Content: Thought content normal.           Assessment & Plan:

## 2023-10-19 NOTE — Patient Instructions (Signed)
 Follow up as needed or as scheduled We'll notify you of your lab results and make any changes if needed Keep up the good work on healthy diet and regular exercise- you look great! Continue to drink LOTS of fluids to help w/ those leg cramps! Add electrolytes as needed Call with any questions or concerns Stay Safe!  Stay Healthy! GOOD LUCK ON THE JOB HUNT!!!

## 2023-10-20 ENCOUNTER — Encounter: Payer: Self-pay | Admitting: Family Medicine

## 2023-10-20 ENCOUNTER — Telehealth: Payer: Self-pay

## 2023-10-20 NOTE — Telephone Encounter (Signed)
 Pt has been notified.

## 2023-10-20 NOTE — Telephone Encounter (Signed)
-----   Message from Laymon Priest sent at 10/20/2023  7:34 AM EDT ----- Labs look great!  No changes at this time

## 2023-10-22 ENCOUNTER — Other Ambulatory Visit (HOSPITAL_COMMUNITY): Payer: Self-pay

## 2023-11-04 ENCOUNTER — Other Ambulatory Visit (HOSPITAL_COMMUNITY): Payer: Self-pay

## 2023-11-04 ENCOUNTER — Other Ambulatory Visit: Payer: Self-pay | Admitting: Hematology & Oncology

## 2023-11-04 MED ORDER — ANASTROZOLE 1 MG PO TABS
1.0000 mg | ORAL_TABLET | Freq: Every day | ORAL | 12 refills | Status: AC
  Filled 2023-11-04: qty 90, 90d supply, fill #0
  Filled 2024-02-22: qty 90, 90d supply, fill #1
  Filled 2024-05-17: qty 90, 90d supply, fill #2

## 2023-11-05 ENCOUNTER — Other Ambulatory Visit (HOSPITAL_COMMUNITY): Payer: Self-pay

## 2023-11-19 ENCOUNTER — Other Ambulatory Visit (HOSPITAL_COMMUNITY): Payer: Self-pay

## 2023-11-26 ENCOUNTER — Other Ambulatory Visit (HOSPITAL_COMMUNITY): Payer: Self-pay

## 2023-12-03 ENCOUNTER — Other Ambulatory Visit (HOSPITAL_COMMUNITY): Payer: Self-pay

## 2023-12-17 ENCOUNTER — Other Ambulatory Visit (HOSPITAL_COMMUNITY): Payer: Self-pay

## 2023-12-22 ENCOUNTER — Other Ambulatory Visit (HOSPITAL_COMMUNITY): Payer: Self-pay

## 2024-01-04 ENCOUNTER — Inpatient Hospital Stay: Payer: Commercial Managed Care - PPO

## 2024-01-04 ENCOUNTER — Inpatient Hospital Stay: Payer: Commercial Managed Care - PPO | Admitting: Family

## 2024-01-11 ENCOUNTER — Other Ambulatory Visit (HOSPITAL_COMMUNITY): Payer: Self-pay

## 2024-01-13 ENCOUNTER — Encounter: Payer: Self-pay | Admitting: Family

## 2024-01-13 ENCOUNTER — Other Ambulatory Visit (HOSPITAL_COMMUNITY): Payer: Self-pay

## 2024-01-13 ENCOUNTER — Inpatient Hospital Stay: Attending: Hematology & Oncology

## 2024-01-13 ENCOUNTER — Inpatient Hospital Stay: Admitting: Family

## 2024-01-13 VITALS — BP 115/70 | HR 74 | Temp 98.2°F | Resp 20 | Ht 62.0 in | Wt 162.0 lb

## 2024-01-13 DIAGNOSIS — R232 Flushing: Secondary | ICD-10-CM

## 2024-01-13 DIAGNOSIS — E559 Vitamin D deficiency, unspecified: Secondary | ICD-10-CM

## 2024-01-13 DIAGNOSIS — T451X5A Adverse effect of antineoplastic and immunosuppressive drugs, initial encounter: Secondary | ICD-10-CM

## 2024-01-13 DIAGNOSIS — Z79811 Long term (current) use of aromatase inhibitors: Secondary | ICD-10-CM | POA: Insufficient documentation

## 2024-01-13 DIAGNOSIS — C50411 Malignant neoplasm of upper-outer quadrant of right female breast: Secondary | ICD-10-CM | POA: Diagnosis not present

## 2024-01-13 DIAGNOSIS — Z170 Estrogen receptor positive status [ER+]: Secondary | ICD-10-CM | POA: Diagnosis not present

## 2024-01-13 DIAGNOSIS — C50911 Malignant neoplasm of unspecified site of right female breast: Secondary | ICD-10-CM | POA: Insufficient documentation

## 2024-01-13 LAB — CMP (CANCER CENTER ONLY)
ALT: 30 U/L (ref 0–44)
AST: 31 U/L (ref 15–41)
Albumin: 4.2 g/dL (ref 3.5–5.0)
Alkaline Phosphatase: 89 U/L (ref 38–126)
Anion gap: 10 (ref 5–15)
BUN: 19 mg/dL (ref 6–20)
CO2: 26 mmol/L (ref 22–32)
Calcium: 9.6 mg/dL (ref 8.9–10.3)
Chloride: 107 mmol/L (ref 98–111)
Creatinine: 0.97 mg/dL (ref 0.44–1.00)
GFR, Estimated: >60 mL/min (ref >60)
Glucose, Bld: 100 mg/dL — ABNORMAL HIGH (ref 70–99)
Potassium: 4.8 mmol/L (ref 3.5–5.1)
Sodium: 142 mmol/L (ref 135–145)
Total Bilirubin: 0.4 mg/dL (ref 0.0–1.2)
Total Protein: 7.3 g/dL (ref 6.5–8.1)

## 2024-01-13 LAB — CBC WITH DIFFERENTIAL (CANCER CENTER ONLY)
Abs Immature Granulocytes: 0 K/uL (ref 0.00–0.07)
Basophils Absolute: 0 K/uL (ref 0.0–0.1)
Basophils Relative: 1 %
Eosinophils Absolute: 0.2 K/uL (ref 0.0–0.5)
Eosinophils Relative: 3 %
HCT: 38.5 % (ref 36.0–46.0)
Hemoglobin: 12.8 g/dL (ref 12.0–15.0)
Immature Granulocytes: 0 %
Lymphocytes Relative: 55 %
Lymphs Abs: 3.3 K/uL (ref 0.7–4.0)
MCH: 30.5 pg (ref 26.0–34.0)
MCHC: 33.2 g/dL (ref 30.0–36.0)
MCV: 91.9 fL (ref 80.0–100.0)
Monocytes Absolute: 0.4 K/uL (ref 0.1–1.0)
Monocytes Relative: 7 %
Neutro Abs: 2 K/uL (ref 1.7–7.7)
Neutrophils Relative %: 34 %
Platelet Count: 293 K/uL (ref 150–400)
RBC: 4.19 MIL/uL (ref 3.87–5.11)
RDW: 11.9 % (ref 11.5–15.5)
WBC Count: 5.9 K/uL (ref 4.0–10.5)
nRBC: 0 % (ref 0.0–0.2)

## 2024-01-13 LAB — VITAMIN D 25 HYDROXY (VIT D DEFICIENCY, FRACTURES): Vit D, 25-Hydroxy: 38.43 ng/mL (ref 30–100)

## 2024-01-13 MED ORDER — GABAPENTIN 300 MG PO CAPS
600.0000 mg | ORAL_CAPSULE | Freq: Every day | ORAL | 3 refills | Status: AC
  Filled 2024-01-13 – 2024-04-28 (×3): qty 180, 90d supply, fill #0
  Filled 2024-07-19: qty 180, 90d supply, fill #1
  Filled ????-??-??: fill #0

## 2024-01-13 NOTE — Progress Notes (Signed)
 Hematology and Oncology Follow Up Visit  Katelyn Reyes [id number] [birth year] 63 y.o. 01/13/2024   Principle Diagnosis:  Stage IA (T1aN0M0) infiltrating ductal carcinoma of the right breast- ER+/PR+/HER2-   Current Therapy:        Status post bilateral mastectomies --04/26/2021 Arimidex  1 mg p.o. daily -- started on 06/29/2021   Interim History:  Katelyn Reyes is here today for follow-up. She is doing well and has been nusy looking for an NP job.  No issues with fatigue or weakness.  She has not noted any blood loss. No bruising or petechiae.  Neurontin  resolved her hot flashes and night sweats.  No fever, chills, n/v, cough, rash, dizziness, SOB, chest pain, palpitations, abdominal pain or changes in bowel or bladder habits.  No changes to her chest per patient.  No adenopathy or lymphedema.  No swelling, tenderness, numbness or tingling in her extremities.  No falls or syncope reported.  Appetite and hydration are good. Weight is stable at 162 lbs.   ECOG Performance Status: 1 - Symptomatic but completely ambulatory  Medications:  Allergies as of 01/13/2024       Reactions   Amoxicillin  Rash, Other (See Comments)   Has patient had a PCN reaction causing immediate rash, facial/tongue/throat swelling, SOB or lightheadedness with hypotension: No Has patient had a PCN reaction causing severe rash involving mucus membranes or skin necrosis: No Has patient had a PCN reaction that required treatment: #  #  #  YES  #  #  # - MD office Has patient had a PCN reaction occurring within the last 10 years: Yes If all of the above answers are NO, then may proceed with Cephalosporin use.   Erythromycin Nausea Only   Hydrocodone-acetaminophen  Other (See Comments)   Hallucinations   Penicillin G Rash   Shellfish Allergy Itching, Other (See Comments)   REACTS TO SCALLOPS        Medication List        Accurate as of January 13, 2024  9:12 AM. If you have any questions, ask your nurse or  doctor.          albuterol  108 (90 Base) MCG/ACT inhaler Commonly known as: VENTOLIN  HFA Inhale 2 puffs into the lungs every 6 (six) hours as needed for wheezing.   ALPRAZolam  0.5 MG tablet Commonly known as: XANAX  Take 1 tablet (0.5 mg total) by mouth 3 (three) times daily as needed.   anastrozole  1 MG tablet Commonly known as: ARIMIDEX  Take 1 tablet (1 mg total) by mouth daily.   cetirizine  10 MG tablet Commonly known as: ZYRTEC  Take 1 tablet (10 mg total) by mouth daily.   fluticasone  50 MCG/ACT nasal spray Commonly known as: FLONASE  Place 2 sprays into both nostrils daily.   gabapentin  300 MG capsule Commonly known as: NEURONTIN  Take 2 capsules (600 mg total) by mouth at bedtime.   MAGNESIUM PO Take by mouth as directed. L-threonate 1000 mg nightly   montelukast  10 MG tablet Commonly known as: SINGULAIR  Take 1 tablet (10 mg total) by mouth daily.   Mounjaro  15 MG/0.5ML Pen Generic drug: tirzepatide  Inject 15 mg into the skin once a week.   ondansetron  4 MG tablet Commonly known as: Zofran  Take 1 tablet (4 mg total) by mouth every 8 (eight) hours as needed for up to 20 doses for nausea or vomiting.   pantoprazole  40 MG tablet Commonly known as: PROTONIX  Take 1 tablet (40 mg total) by mouth 2 (two) times daily.   sertraline   100 MG tablet Commonly known as: ZOLOFT  Take 1 tablet (100 mg total) by mouth daily.   TechLite Pen Needles 32G X 4 MM Misc Generic drug: Insulin  Pen Needle Use daily w/ saxenda    tiZANidine  4 MG tablet Commonly known as: Zanaflex  Take 1 tablet (4 mg total) by mouth 2 (two) times daily.   Vitamin D -3 125 MCG (5000 UT) Tabs Take 5,000 Units by mouth every evening.   zolpidem  10 MG tablet Commonly known as: AMBIEN  Take 1 tablet (10 mg total) by mouth at bedtime as needed for sleep.        Allergies:  Allergies  Allergen Reactions   Amoxicillin  Rash and Other (See Comments)    Has patient had a PCN reaction causing  immediate rash, facial/tongue/throat swelling, SOB or lightheadedness with hypotension: No Has patient had a PCN reaction causing severe rash involving mucus membranes or skin necrosis: No Has patient had a PCN reaction that required treatment: #  #  #  YES  #  #  # - MD office Has patient had a PCN reaction occurring within the last 10 years: Yes If all of the above answers are NO, then may proceed with Cephalosporin use.    Erythromycin Nausea Only   Hydrocodone-Acetaminophen  Other (See Comments)    Hallucinations   Penicillin G Rash   Shellfish Allergy Itching and Other (See Comments)    REACTS TO SCALLOPS     Past Medical History, Surgical history, Social history, and Family History were reviewed and updated.  Review of Systems: All other 10 point review of systems is negative.   Physical Exam:  height is 5' 2 (1.575 m) and weight is 162 lb (73.5 kg). Her oral temperature is 98.2 F (36.8 C). Her blood pressure is 115/70 and her pulse is 74. Her respiration is 20 and oxygen saturation is 100%.   Wt Readings from Last 3 Encounters:  01/13/24 162 lb (73.5 kg)  10/19/23 157 lb 8 oz (71.4 kg)  10/05/23 154 lb 5.2 oz (70 kg)    Ocular: Sclerae unicteric, pupils equal, round and reactive to light Ear-nose-throat: Oropharynx clear, dentition fair Lymphatic: No cervical or supraclavicular adenopathy Lungs no rales or rhonchi, good excursion bilaterally Heart regular rate and rhythm, no murmur appreciated Abd soft, nontender, positive bowel sounds MSK no focal spinal tenderness, no joint edema Neuro: non-focal, well-oriented, appropriate affect Breasts: Deferred   Lab Results  Component Value Date   WBC 5.9 01/13/2024   HGB 12.8 01/13/2024   HCT 38.5 01/13/2024   MCV 91.9 01/13/2024   PLT 293 01/13/2024   No results found for: FERRITIN, IRON, TIBC, UIBC, IRONPCTSAT Lab Results  Component Value Date   RBC 4.19 01/13/2024   No results found for:  KPAFRELGTCHN, LAMBDASER, KAPLAMBRATIO No results found for: IGGSERUM, IGA, IGMSERUM No results found for: STEPHANY CARLOTA BENSON MARKEL EARLA JOANNIE DOC VICK, SPEI   Chemistry      Component Value Date/Time   NA 142 01/13/2024 0814   K 4.8 01/13/2024 0814   CL 107 01/13/2024 0814   CO2 26 01/13/2024 0814   BUN 19 01/13/2024 0814   CREATININE 0.97 01/13/2024 0814   CREATININE 0.92 04/28/2017 1645      Component Value Date/Time   CALCIUM 9.6 01/13/2024 0814   ALKPHOS 89 01/13/2024 0814   AST 31 01/13/2024 0814   ALT 30 01/13/2024 0814   BILITOT 0.4 01/13/2024 0814       Impression and Plan:  Katelyn Reyes is a  very pleasant 63 yo postmenopausal African American female with history of very early stage ductal carcinoma of the right breast.  She had bilateral mastectomies in 04/2021 followed by reconstruction and is currently on Arimidex  which she has tolerated nicely. She will continue her same regimen.  Follow-up in 6 months.   Lauraine Pepper, NP 7/30/20259:12 AM

## 2024-01-20 ENCOUNTER — Other Ambulatory Visit (HOSPITAL_COMMUNITY): Payer: Self-pay

## 2024-01-21 ENCOUNTER — Other Ambulatory Visit (HOSPITAL_COMMUNITY): Payer: Self-pay

## 2024-01-26 ENCOUNTER — Ambulatory Visit: Payer: Commercial Managed Care - PPO | Admitting: Family Medicine

## 2024-01-28 ENCOUNTER — Other Ambulatory Visit (HOSPITAL_COMMUNITY): Payer: Self-pay

## 2024-01-28 ENCOUNTER — Other Ambulatory Visit: Payer: Self-pay | Admitting: Family Medicine

## 2024-01-28 MED ORDER — PANTOPRAZOLE SODIUM 40 MG PO TBEC
40.0000 mg | DELAYED_RELEASE_TABLET | Freq: Two times a day (BID) | ORAL | 1 refills | Status: AC
  Filled 2024-01-28: qty 180, 90d supply, fill #0
  Filled 2024-04-28: qty 180, 90d supply, fill #1

## 2024-02-10 ENCOUNTER — Ambulatory Visit: Admitting: Family Medicine

## 2024-02-10 DIAGNOSIS — Z853 Personal history of malignant neoplasm of breast: Secondary | ICD-10-CM | POA: Diagnosis not present

## 2024-02-10 DIAGNOSIS — Z9013 Acquired absence of bilateral breasts and nipples: Secondary | ICD-10-CM | POA: Diagnosis not present

## 2024-02-17 ENCOUNTER — Other Ambulatory Visit (HOSPITAL_COMMUNITY): Payer: Self-pay

## 2024-02-17 ENCOUNTER — Ambulatory Visit: Admitting: Family Medicine

## 2024-02-17 ENCOUNTER — Encounter: Payer: Self-pay | Admitting: Family Medicine

## 2024-02-17 ENCOUNTER — Other Ambulatory Visit: Payer: Self-pay

## 2024-02-17 VITALS — BP 126/74 | HR 80 | Temp 97.9°F | Resp 19 | Ht 62.0 in | Wt 159.2 lb

## 2024-02-17 DIAGNOSIS — Z23 Encounter for immunization: Secondary | ICD-10-CM

## 2024-02-17 DIAGNOSIS — E669 Obesity, unspecified: Secondary | ICD-10-CM

## 2024-02-17 LAB — BASIC METABOLIC PANEL WITH GFR
BUN: 18 mg/dL (ref 6–23)
CO2: 30 meq/L (ref 19–32)
Calcium: 9.3 mg/dL (ref 8.4–10.5)
Chloride: 104 meq/L (ref 96–112)
Creatinine, Ser: 0.91 mg/dL (ref 0.40–1.20)
GFR: 68.47 mL/min (ref >60.00)
Glucose, Bld: 86 mg/dL (ref 70–99)
Potassium: 4.5 meq/L (ref 3.5–5.1)
Sodium: 139 meq/L (ref 135–145)

## 2024-02-17 LAB — LIPID PANEL
Cholesterol: 190 mg/dL (ref 0–200)
HDL: 62.5 mg/dL (ref >39.00)
LDL Cholesterol: 114 mg/dL — ABNORMAL HIGH (ref 0–99)
NonHDL: 127.12
Total CHOL/HDL Ratio: 3
Triglycerides: 68 mg/dL (ref 0.0–149.0)
VLDL: 13.6 mg/dL (ref 0.0–40.0)

## 2024-02-17 LAB — HEPATIC FUNCTION PANEL
ALT: 19 U/L (ref 0–35)
AST: 25 U/L (ref 0–37)
Albumin: 4.2 g/dL (ref 3.5–5.2)
Alkaline Phosphatase: 73 U/L (ref 39–117)
Bilirubin, Direct: 0 mg/dL (ref 0.0–0.3)
Total Bilirubin: 0.5 mg/dL (ref 0.2–1.2)
Total Protein: 7.6 g/dL (ref 6.0–8.3)

## 2024-02-17 LAB — TSH: TSH: 2.02 u[IU]/mL (ref 0.35–5.50)

## 2024-02-17 MED ORDER — ZOLPIDEM TARTRATE 10 MG PO TABS
10.0000 mg | ORAL_TABLET | Freq: Every evening | ORAL | 3 refills | Status: DC | PRN
  Filled 2024-02-17: qty 30, 30d supply, fill #0
  Filled 2024-03-21: qty 30, 30d supply, fill #1
  Filled 2024-04-20: qty 30, 30d supply, fill #2
  Filled 2024-05-21 (×2): qty 30, 30d supply, fill #3

## 2024-02-17 MED ORDER — TIRZEPATIDE 15 MG/0.5ML ~~LOC~~ SOAJ
15.0000 mg | SUBCUTANEOUS | 1 refills | Status: AC
  Filled 2024-02-17: qty 2, 28d supply, fill #0
  Filled 2024-03-23: qty 2, 28d supply, fill #1
  Filled 2024-05-17: qty 2, 28d supply, fill #2

## 2024-02-17 MED ORDER — SERTRALINE HCL 100 MG PO TABS
100.0000 mg | ORAL_TABLET | Freq: Every day | ORAL | 1 refills | Status: AC
  Filled 2024-02-17: qty 90, 90d supply, fill #0
  Filled 2024-05-20: qty 90, 90d supply, fill #1

## 2024-02-17 NOTE — Assessment & Plan Note (Signed)
 Improving.  Pt is down 10 lbs since Feb and now out of the obese range w/ BMI 29.12  Applauded her efforts.  Currently asymptomatic.  Refill provided on Tirzepatide .  Will continue to follow.

## 2024-02-17 NOTE — Patient Instructions (Signed)
Schedule your complete physical in 6 months We'll notify you of your lab results and make any changes if needed Keep up the good work on healthy diet and regular exercise- you look great! Call with any questions or concerns Hang in there!!!

## 2024-02-17 NOTE — Progress Notes (Signed)
   Subjective:    Patient ID: Katelyn Reyes, female    DOB: 12-19-1960, 63 y.o.   MRN: [id number]  HPI Obesity- pt is down 10 lbs since Feb.  BMI now out of obese range at 29.12.  Currently on Tirzepatide .  Pt reports feeling good.  No CP, SOB, HA's, visual changes, abd pain, N/V.  Does report urinary frequency.   Review of Systems For ROS see HPI     Objective:   Physical Exam Vitals reviewed.  Constitutional:      General: She is not in acute distress.    Appearance: Normal appearance. She is well-developed. She is not ill-appearing.  HENT:     Head: Normocephalic and atraumatic.  Eyes:     Conjunctiva/sclera: Conjunctivae normal.     Pupils: Pupils are equal, round, and reactive to light.  Neck:     Thyroid : No thyromegaly.  Cardiovascular:     Rate and Rhythm: Normal rate and regular rhythm.     Pulses: Normal pulses.     Heart sounds: Normal heart sounds. No murmur heard. Pulmonary:     Effort: Pulmonary effort is normal. No respiratory distress.     Breath sounds: Normal breath sounds.  Abdominal:     General: There is no distension.     Palpations: Abdomen is soft.     Tenderness: There is no abdominal tenderness.  Musculoskeletal:     Cervical back: Normal range of motion and neck supple.     Right lower leg: No edema.     Left lower leg: No edema.  Lymphadenopathy:     Cervical: No cervical adenopathy.  Skin:    General: Skin is warm and dry.  Neurological:     Mental Status: She is alert and oriented to person, place, and time.  Psychiatric:        Behavior: Behavior normal.           Assessment & Plan:

## 2024-02-18 ENCOUNTER — Ambulatory Visit: Payer: Self-pay | Admitting: Family Medicine

## 2024-02-18 LAB — CBC WITH DIFFERENTIAL/PLATELET
Basophils Absolute: 0 K/uL (ref 0.0–0.1)
Basophils Relative: 0.7 % (ref 0.0–3.0)
Eosinophils Absolute: 0.2 K/uL (ref 0.0–0.7)
Eosinophils Relative: 3.6 % (ref 0.0–5.0)
HCT: 37.9 % (ref 36.0–46.0)
Hemoglobin: 12.6 g/dL (ref 12.0–15.0)
Lymphocytes Relative: 48.5 % — ABNORMAL HIGH (ref 12.0–46.0)
Lymphs Abs: 2.9 K/uL (ref 0.7–4.0)
MCHC: 33.2 g/dL (ref 30.0–36.0)
MCV: 92.1 fl (ref 78.0–100.0)
Monocytes Absolute: 0.3 K/uL (ref 0.1–1.0)
Monocytes Relative: 5.7 % (ref 3.0–12.0)
Neutro Abs: 2.5 K/uL (ref 1.4–7.7)
Neutrophils Relative %: 41.5 % — ABNORMAL LOW (ref 43.0–77.0)
Platelets: 292 K/uL (ref 150.0–400.0)
RBC: 4.12 Mil/uL (ref 3.87–5.11)
RDW: 12.8 % (ref 11.5–15.5)
WBC: 6 K/uL (ref 4.0–10.5)

## 2024-02-18 LAB — HEMOGLOBIN A1C: Hgb A1c MFr Bld: 5.6 % (ref 4.6–6.5)

## 2024-02-18 NOTE — Progress Notes (Signed)
 Called patient to relay lab results. Left vm to return call

## 2024-02-19 ENCOUNTER — Other Ambulatory Visit (HOSPITAL_COMMUNITY): Payer: Self-pay

## 2024-02-19 ENCOUNTER — Other Ambulatory Visit: Payer: Self-pay | Admitting: Family Medicine

## 2024-02-19 MED ORDER — ALPRAZOLAM 0.5 MG PO TABS
0.5000 mg | ORAL_TABLET | Freq: Three times a day (TID) | ORAL | 3 refills | Status: AC | PRN
  Filled 2024-02-19: qty 60, 20d supply, fill #0
  Filled 2024-07-19: qty 60, 20d supply, fill #1

## 2024-02-19 MED ORDER — FLUTICASONE PROPIONATE 50 MCG/ACT NA SUSP
2.0000 | Freq: Every day | NASAL | 6 refills | Status: AC
  Filled 2024-02-19: qty 16, 30d supply, fill #0
  Filled 2024-07-05: qty 16, 30d supply, fill #1

## 2024-02-19 NOTE — Telephone Encounter (Signed)
 Refill request for Alprazolam  0.5mg  60tab 3refills last fill 07/29/23. Last OV 02/17/24

## 2024-02-22 ENCOUNTER — Other Ambulatory Visit: Payer: Self-pay

## 2024-02-22 ENCOUNTER — Other Ambulatory Visit (HOSPITAL_COMMUNITY): Payer: Self-pay

## 2024-02-23 DIAGNOSIS — H5213 Myopia, bilateral: Secondary | ICD-10-CM | POA: Diagnosis not present

## 2024-02-23 DIAGNOSIS — H52223 Regular astigmatism, bilateral: Secondary | ICD-10-CM | POA: Diagnosis not present

## 2024-02-23 DIAGNOSIS — H524 Presbyopia: Secondary | ICD-10-CM | POA: Diagnosis not present

## 2024-02-24 DIAGNOSIS — Z01419 Encounter for gynecological examination (general) (routine) without abnormal findings: Secondary | ICD-10-CM | POA: Diagnosis not present

## 2024-02-24 DIAGNOSIS — Z853 Personal history of malignant neoplasm of breast: Secondary | ICD-10-CM | POA: Diagnosis not present

## 2024-02-24 DIAGNOSIS — Z1211 Encounter for screening for malignant neoplasm of colon: Secondary | ICD-10-CM | POA: Diagnosis not present

## 2024-02-24 DIAGNOSIS — Z1330 Encounter for screening examination for mental health and behavioral disorders, unspecified: Secondary | ICD-10-CM | POA: Diagnosis not present

## 2024-02-24 DIAGNOSIS — R87810 Cervical high risk human papillomavirus (HPV) DNA test positive: Secondary | ICD-10-CM | POA: Diagnosis not present

## 2024-02-24 DIAGNOSIS — Z9013 Acquired absence of bilateral breasts and nipples: Secondary | ICD-10-CM | POA: Diagnosis not present

## 2024-02-24 DIAGNOSIS — N952 Postmenopausal atrophic vaginitis: Secondary | ICD-10-CM | POA: Diagnosis not present

## 2024-03-01 LAB — HM PAP SMEAR: HPV, high-risk: NEGATIVE

## 2024-03-07 ENCOUNTER — Other Ambulatory Visit (HOSPITAL_BASED_OUTPATIENT_CLINIC_OR_DEPARTMENT_OTHER): Payer: Self-pay

## 2024-03-07 MED ORDER — COMIRNATY 30 MCG/0.3ML IM SUSY
0.3000 mL | PREFILLED_SYRINGE | Freq: Once | INTRAMUSCULAR | 0 refills | Status: AC
  Filled 2024-03-07: qty 0.3, 1d supply, fill #0

## 2024-03-14 DIAGNOSIS — G8929 Other chronic pain: Secondary | ICD-10-CM | POA: Diagnosis not present

## 2024-03-14 DIAGNOSIS — M9905 Segmental and somatic dysfunction of pelvic region: Secondary | ICD-10-CM | POA: Diagnosis not present

## 2024-03-14 DIAGNOSIS — M9902 Segmental and somatic dysfunction of thoracic region: Secondary | ICD-10-CM | POA: Diagnosis not present

## 2024-03-14 DIAGNOSIS — Q7649 Other congenital malformations of spine, not associated with scoliosis: Secondary | ICD-10-CM | POA: Diagnosis not present

## 2024-03-14 DIAGNOSIS — M9903 Segmental and somatic dysfunction of lumbar region: Secondary | ICD-10-CM | POA: Diagnosis not present

## 2024-03-14 DIAGNOSIS — M25552 Pain in left hip: Secondary | ICD-10-CM | POA: Diagnosis not present

## 2024-03-14 DIAGNOSIS — M9906 Segmental and somatic dysfunction of lower extremity: Secondary | ICD-10-CM | POA: Diagnosis not present

## 2024-03-14 DIAGNOSIS — M5442 Lumbago with sciatica, left side: Secondary | ICD-10-CM | POA: Diagnosis not present

## 2024-03-14 DIAGNOSIS — M9904 Segmental and somatic dysfunction of sacral region: Secondary | ICD-10-CM | POA: Diagnosis not present

## 2024-03-21 ENCOUNTER — Other Ambulatory Visit (HOSPITAL_COMMUNITY): Payer: Self-pay

## 2024-03-23 ENCOUNTER — Other Ambulatory Visit (HOSPITAL_COMMUNITY): Payer: Self-pay

## 2024-04-11 DIAGNOSIS — M9906 Segmental and somatic dysfunction of lower extremity: Secondary | ICD-10-CM | POA: Diagnosis not present

## 2024-04-11 DIAGNOSIS — M9908 Segmental and somatic dysfunction of rib cage: Secondary | ICD-10-CM | POA: Diagnosis not present

## 2024-04-11 DIAGNOSIS — M25552 Pain in left hip: Secondary | ICD-10-CM | POA: Diagnosis not present

## 2024-04-11 DIAGNOSIS — M9903 Segmental and somatic dysfunction of lumbar region: Secondary | ICD-10-CM | POA: Diagnosis not present

## 2024-04-11 DIAGNOSIS — M9904 Segmental and somatic dysfunction of sacral region: Secondary | ICD-10-CM | POA: Diagnosis not present

## 2024-04-11 DIAGNOSIS — M9905 Segmental and somatic dysfunction of pelvic region: Secondary | ICD-10-CM | POA: Diagnosis not present

## 2024-04-11 DIAGNOSIS — M9902 Segmental and somatic dysfunction of thoracic region: Secondary | ICD-10-CM | POA: Diagnosis not present

## 2024-04-20 ENCOUNTER — Other Ambulatory Visit (HOSPITAL_COMMUNITY): Payer: Self-pay

## 2024-04-28 ENCOUNTER — Other Ambulatory Visit (HOSPITAL_COMMUNITY): Payer: Self-pay

## 2024-04-30 ENCOUNTER — Other Ambulatory Visit (HOSPITAL_COMMUNITY): Payer: Self-pay

## 2024-05-02 DIAGNOSIS — M9904 Segmental and somatic dysfunction of sacral region: Secondary | ICD-10-CM | POA: Diagnosis not present

## 2024-05-02 DIAGNOSIS — M9905 Segmental and somatic dysfunction of pelvic region: Secondary | ICD-10-CM | POA: Diagnosis not present

## 2024-05-02 DIAGNOSIS — M9903 Segmental and somatic dysfunction of lumbar region: Secondary | ICD-10-CM | POA: Diagnosis not present

## 2024-05-02 DIAGNOSIS — M25552 Pain in left hip: Secondary | ICD-10-CM | POA: Diagnosis not present

## 2024-05-02 DIAGNOSIS — M9908 Segmental and somatic dysfunction of rib cage: Secondary | ICD-10-CM | POA: Diagnosis not present

## 2024-05-02 DIAGNOSIS — M545 Low back pain, unspecified: Secondary | ICD-10-CM | POA: Diagnosis not present

## 2024-05-02 DIAGNOSIS — M791 Myalgia, unspecified site: Secondary | ICD-10-CM | POA: Diagnosis not present

## 2024-05-02 DIAGNOSIS — M9906 Segmental and somatic dysfunction of lower extremity: Secondary | ICD-10-CM | POA: Diagnosis not present

## 2024-05-02 DIAGNOSIS — M9902 Segmental and somatic dysfunction of thoracic region: Secondary | ICD-10-CM | POA: Diagnosis not present

## 2024-05-17 ENCOUNTER — Other Ambulatory Visit (HOSPITAL_COMMUNITY): Payer: Self-pay

## 2024-05-19 ENCOUNTER — Other Ambulatory Visit (HOSPITAL_COMMUNITY): Payer: Self-pay

## 2024-05-20 ENCOUNTER — Other Ambulatory Visit (HOSPITAL_COMMUNITY): Payer: Self-pay

## 2024-05-21 ENCOUNTER — Other Ambulatory Visit (HOSPITAL_COMMUNITY): Payer: Self-pay

## 2024-05-25 DIAGNOSIS — M546 Pain in thoracic spine: Secondary | ICD-10-CM | POA: Diagnosis not present

## 2024-05-25 DIAGNOSIS — M9901 Segmental and somatic dysfunction of cervical region: Secondary | ICD-10-CM | POA: Diagnosis not present

## 2024-05-25 DIAGNOSIS — M9902 Segmental and somatic dysfunction of thoracic region: Secondary | ICD-10-CM | POA: Diagnosis not present

## 2024-05-25 DIAGNOSIS — M9906 Segmental and somatic dysfunction of lower extremity: Secondary | ICD-10-CM | POA: Diagnosis not present

## 2024-05-25 DIAGNOSIS — M9905 Segmental and somatic dysfunction of pelvic region: Secondary | ICD-10-CM | POA: Diagnosis not present

## 2024-05-25 DIAGNOSIS — M9903 Segmental and somatic dysfunction of lumbar region: Secondary | ICD-10-CM | POA: Diagnosis not present

## 2024-05-25 DIAGNOSIS — M9907 Segmental and somatic dysfunction of upper extremity: Secondary | ICD-10-CM | POA: Diagnosis not present

## 2024-05-25 DIAGNOSIS — M542 Cervicalgia: Secondary | ICD-10-CM | POA: Diagnosis not present

## 2024-05-25 DIAGNOSIS — M9904 Segmental and somatic dysfunction of sacral region: Secondary | ICD-10-CM | POA: Diagnosis not present

## 2024-05-25 DIAGNOSIS — M25552 Pain in left hip: Secondary | ICD-10-CM | POA: Diagnosis not present

## 2024-06-20 ENCOUNTER — Other Ambulatory Visit: Payer: Self-pay | Admitting: Family Medicine

## 2024-06-20 ENCOUNTER — Other Ambulatory Visit (HOSPITAL_COMMUNITY): Payer: Self-pay

## 2024-06-20 MED ORDER — ZOLPIDEM TARTRATE 10 MG PO TABS
10.0000 mg | ORAL_TABLET | Freq: Every evening | ORAL | 3 refills | Status: AC | PRN
  Filled 2024-06-20: qty 30, 30d supply, fill #0
  Filled 2024-07-19: qty 30, 30d supply, fill #1

## 2024-06-20 NOTE — Telephone Encounter (Signed)
 Requested Prescriptions   Pending Prescriptions Disp Refills   zolpidem  (AMBIEN ) 10 MG tablet 30 tablet 3    Sig: Take 1 tablet (10 mg total) by mouth at bedtime as needed for sleep.     Date of patient request: 06/20/24 Last office visit: 02/17/2024 Upcoming visit: 08/22/2024 Date of last refill: 02/17/24 Last refill amount: 30 w/ 3 refills

## 2024-07-05 ENCOUNTER — Other Ambulatory Visit (HOSPITAL_COMMUNITY): Payer: Self-pay

## 2024-07-05 ENCOUNTER — Other Ambulatory Visit: Payer: Self-pay | Admitting: Family Medicine

## 2024-07-05 MED ORDER — MONTELUKAST SODIUM 10 MG PO TABS
10.0000 mg | ORAL_TABLET | Freq: Every day | ORAL | 1 refills | Status: AC
  Filled 2024-07-05: qty 90, 90d supply, fill #0

## 2024-07-06 ENCOUNTER — Other Ambulatory Visit (HOSPITAL_COMMUNITY): Payer: Self-pay

## 2024-07-15 ENCOUNTER — Inpatient Hospital Stay: Admitting: Family

## 2024-07-15 ENCOUNTER — Inpatient Hospital Stay

## 2024-07-19 ENCOUNTER — Inpatient Hospital Stay

## 2024-07-19 ENCOUNTER — Encounter: Payer: Self-pay | Admitting: Family

## 2024-07-19 ENCOUNTER — Inpatient Hospital Stay: Admitting: Family

## 2024-07-19 ENCOUNTER — Other Ambulatory Visit (HOSPITAL_COMMUNITY): Payer: Self-pay

## 2024-07-19 ENCOUNTER — Other Ambulatory Visit: Payer: Self-pay

## 2024-07-19 VITALS — BP 121/81 | HR 67 | Temp 98.4°F | Resp 18 | Ht 62.0 in | Wt 167.0 lb

## 2024-07-19 DIAGNOSIS — C50411 Malignant neoplasm of upper-outer quadrant of right female breast: Secondary | ICD-10-CM

## 2024-07-19 DIAGNOSIS — E559 Vitamin D deficiency, unspecified: Secondary | ICD-10-CM

## 2024-07-19 LAB — CBC WITH DIFFERENTIAL (CANCER CENTER ONLY)
Abs Immature Granulocytes: 0.01 10*3/uL (ref 0.00–0.07)
Basophils Absolute: 0 10*3/uL (ref 0.0–0.1)
Basophils Relative: 0 %
Eosinophils Absolute: 0.1 10*3/uL (ref 0.0–0.5)
Eosinophils Relative: 2 %
HCT: 39.4 % (ref 36.0–46.0)
Hemoglobin: 12.5 g/dL (ref 12.0–15.0)
Immature Granulocytes: 0 %
Lymphocytes Relative: 50 %
Lymphs Abs: 2.9 10*3/uL (ref 0.7–4.0)
MCH: 30.7 pg (ref 26.0–34.0)
MCHC: 31.7 g/dL (ref 30.0–36.0)
MCV: 96.8 fL (ref 80.0–100.0)
Monocytes Absolute: 0.4 10*3/uL (ref 0.1–1.0)
Monocytes Relative: 8 %
Neutro Abs: 2.3 10*3/uL (ref 1.7–7.7)
Neutrophils Relative %: 40 %
Platelet Count: 289 10*3/uL (ref 150–400)
RBC: 4.07 MIL/uL (ref 3.87–5.11)
RDW: 12.1 % (ref 11.5–15.5)
WBC Count: 5.7 10*3/uL (ref 4.0–10.5)
nRBC: 0 % (ref 0.0–0.2)

## 2024-07-19 LAB — CMP (CANCER CENTER ONLY)
ALT: 28 U/L (ref 0–44)
AST: 32 U/L (ref 15–41)
Albumin: 4.4 g/dL (ref 3.5–5.0)
Alkaline Phosphatase: 96 U/L (ref 38–126)
Anion gap: 12 (ref 5–15)
BUN: 18 mg/dL (ref 8–23)
CO2: 25 mmol/L (ref 22–32)
Calcium: 9.7 mg/dL (ref 8.9–10.3)
Chloride: 110 mmol/L (ref 98–111)
Creatinine: 0.91 mg/dL (ref 0.44–1.00)
GFR, Estimated: >60 mL/min
Glucose, Bld: 98 mg/dL (ref 70–99)
Potassium: 5.1 mmol/L (ref 3.5–5.1)
Sodium: 148 mmol/L — ABNORMAL HIGH (ref 135–145)
Total Bilirubin: 0.3 mg/dL (ref 0.0–1.2)
Total Protein: 7.5 g/dL (ref 6.5–8.1)

## 2024-07-19 LAB — VITAMIN D 25 HYDROXY (VIT D DEFICIENCY, FRACTURES): Vit D, 25-Hydroxy: 36.4 ng/mL (ref 30–100)

## 2024-07-19 NOTE — Progress Notes (Signed)
 " Hematology and Oncology Follow Up Visit  Katelyn Reyes [id number] Dec 07, 1960 64 y.o. 07/19/2024   Principle Diagnosis:  Stage IA (T1aN0M0) infiltrating ductal carcinoma of the right breast- ER+/PR+/HER2-   Current Therapy:        Status post bilateral mastectomies --04/26/2021 Arimidex  1 mg p.o. daily -- started on 06/29/2021   Interim History:  Katelyn Reyes is here today for follow-up. She is doing quite well and has no complaints at this time.  Bilateral breast exam negative. No adenopathy noteed. No mass, lesion or rash.  No lymphedema.  She has the occasional hot flash or night sweat on Arimidex  but states that this is tolerable. She is taking her medication as prescribed.  No fever, chills, n/v, cough, rash, dizziness, SOB, chest pain, palpitations, abdominal pain or changes in bowel or bladder habits.  No swelling, tenderness, numbness or tingling in her extremities.  No falls or syncope reported.  Appetite and hydration are good. Weight is stable at 167 lbs.   ECOG Performance Status: 1 - Symptomatic but completely ambulatory  Medications:  Allergies as of 07/19/2024       Reactions   Amoxicillin  Rash, Other (See Comments)   Has patient had a PCN reaction causing immediate rash, facial/tongue/throat swelling, SOB or lightheadedness with hypotension: No Has patient had a PCN reaction causing severe rash involving mucus membranes or skin necrosis: No Has patient had a PCN reaction that required treatment: #  #  #  YES  #  #  # - MD office Has patient had a PCN reaction occurring within the last 10 years: Yes If all of the above answers are NO, then may proceed with Cephalosporin use.   Erythromycin Nausea Only   Hydrocodone-acetaminophen  Other (See Comments)   Hallucinations   Penicillin G Rash   Shellfish Allergy Itching, Other (See Comments)   REACTS TO SCALLOPS        Medication List        Accurate as of July 19, 2024  2:02 PM. If you have any questions,  ask your nurse or doctor.          albuterol  108 (90 Base) MCG/ACT inhaler Commonly known as: VENTOLIN  HFA Inhale 2 puffs into the lungs every 6 (six) hours as needed for wheezing.   ALPRAZolam  0.5 MG tablet Commonly known as: XANAX  Take 1 tablet (0.5 mg total) by mouth 3 (three) times daily as needed.   anastrozole  1 MG tablet Commonly known as: ARIMIDEX  Take 1 tablet (1 mg total) by mouth daily.   cetirizine  10 MG tablet Commonly known as: ZYRTEC  Take 1 tablet (10 mg total) by mouth daily.   fluticasone  50 MCG/ACT nasal spray Commonly known as: FLONASE  Place 2 sprays into both nostrils daily.   gabapentin  300 MG capsule Commonly known as: NEURONTIN  Take 2 capsules (600 mg total) by mouth at bedtime.   MAGNESIUM PO Take by mouth as directed. L-threonate 1000 mg nightly   montelukast  10 MG tablet Commonly known as: SINGULAIR  Take 1 tablet (10 mg total) by mouth daily.   Mounjaro  15 MG/0.5ML Pen Generic drug: tirzepatide  Inject 15 mg into the skin once a week.   ondansetron  4 MG tablet Commonly known as: Zofran  Take 1 tablet (4 mg total) by mouth every 8 (eight) hours as needed for up to 20 doses for nausea or vomiting.   pantoprazole  40 MG tablet Commonly known as: PROTONIX  Take 1 tablet (40 mg total) by mouth 2 (two) times daily.   sertraline   100 MG tablet Commonly known as: ZOLOFT  Take 1 tablet (100 mg total) by mouth daily.   TechLite Pen Needles 32G X 4 MM Misc Generic drug: Insulin  Pen Needle Use daily w/ saxenda    Vitamin D -3 125 MCG (5000 UT) Tabs Take 5,000 Units by mouth every evening.   zolpidem  10 MG tablet Commonly known as: AMBIEN  Take 1 tablet (10 mg total) by mouth at bedtime as needed for sleep.        Allergies: Allergies[1]  Past Medical History, Surgical history, Social history, and Family History were reviewed and updated.  Review of Systems: All other 10 point review of systems is negative.   Physical Exam:  height is 5'  2 (1.575 m) and weight is 167 lb (75.8 kg). Her oral temperature is 98.4 F (36.9 C). Her blood pressure is 121/81 and her pulse is 67. Her respiration is 18 and oxygen saturation is 99%.   Wt Readings from Last 3 Encounters:  07/19/24 167 lb (75.8 kg)  02/17/24 159 lb 3.2 oz (72.2 kg)  01/13/24 162 lb (73.5 kg)    Ocular: Sclerae unicteric, pupils equal, round and reactive to light Ear-nose-throat: Oropharynx clear, dentition fair Lymphatic: No cervical, supraclavicular or axillaryadenopathy Lungs no rales or rhonchi, good excursion bilaterally Heart regular rate and rhythm, no murmur appreciated Abd soft, nontender, positive bowel sounds MSK no focal spinal tenderness, no joint edema Neuro: non-focal, well-oriented, appropriate affect Breasts: Same as above.   Lab Results  Component Value Date   WBC 5.7 07/19/2024   HGB 12.5 07/19/2024   HCT 39.4 07/19/2024   MCV 96.8 07/19/2024   PLT 289 07/19/2024   No results found for: FERRITIN, IRON, TIBC, UIBC, IRONPCTSAT Lab Results  Component Value Date   RBC 4.07 07/19/2024   No results found for: KPAFRELGTCHN, LAMBDASER, KAPLAMBRATIO No results found for: IGGSERUM, IGA, IGMSERUM No results found for: STEPHANY CARLOTA BENSON MARKEL EARLA JOANNIE DOC VICK, SPEI   Chemistry      Component Value Date/Time   NA 139 02/17/2024 1052   K 4.5 02/17/2024 1052   CL 104 02/17/2024 1052   CO2 30 02/17/2024 1052   BUN 18 02/17/2024 1052   CREATININE 0.91 02/17/2024 1052   CREATININE 0.97 01/13/2024 0814   CREATININE 0.92 04/28/2017 1645      Component Value Date/Time   CALCIUM 9.3 02/17/2024 1052   ALKPHOS 73 02/17/2024 1052   AST 25 02/17/2024 1052   AST 31 01/13/2024 0814   ALT 19 02/17/2024 1052   ALT 30 01/13/2024 0814   BILITOT 0.5 02/17/2024 1052   BILITOT 0.4 01/13/2024 0814       Impression and Plan: Katelyn Reyes is a very pleasant 64 yo postmenopausal African  American female with history of very early stage ductal carcinoma of the right breast.  She had bilateral mastectomies in 04/2021 followed by reconstruction and is currently on Arimidex  which she has tolerated nicely. She will continue her same regimen.  Follow-up in 6 months.   Lauraine Pepper, NP 2/3/20262:02 PM     [1]  Allergies Allergen Reactions   Amoxicillin  Rash and Other (See Comments)    Has patient had a PCN reaction causing immediate rash, facial/tongue/throat swelling, SOB or lightheadedness with hypotension: No Has patient had a PCN reaction causing severe rash involving mucus membranes or skin necrosis: No Has patient had a PCN reaction that required treatment: #  #  #  YES  #  #  # - MD office Has patient  had a PCN reaction occurring within the last 10 years: Yes If all of the above answers are NO, then may proceed with Cephalosporin use.    Erythromycin Nausea Only   Hydrocodone-Acetaminophen  Other (See Comments)    Hallucinations   Penicillin G Rash   Shellfish Allergy Itching and Other (See Comments)    REACTS TO SCALLOPS    "

## 2024-08-22 ENCOUNTER — Encounter: Admitting: Family Medicine

## 2025-01-20 ENCOUNTER — Inpatient Hospital Stay

## 2025-01-20 ENCOUNTER — Inpatient Hospital Stay: Admitting: Family
